# Patient Record
Sex: Female | Born: 1960 | Race: Black or African American | Hispanic: No | Smoking: Former smoker
Health system: Southern US, Community
[De-identification: ages and names within clinical notes are randomized; demographics above are authoritative.]

## PROBLEM LIST (undated history)

## (undated) DIAGNOSIS — E669 Obesity, unspecified: Secondary | ICD-10-CM

## (undated) DIAGNOSIS — J45909 Unspecified asthma, uncomplicated: Secondary | ICD-10-CM

## (undated) DIAGNOSIS — G4700 Insomnia, unspecified: Secondary | ICD-10-CM

## (undated) DIAGNOSIS — M1990 Unspecified osteoarthritis, unspecified site: Secondary | ICD-10-CM

## (undated) DIAGNOSIS — K219 Gastro-esophageal reflux disease without esophagitis: Secondary | ICD-10-CM

## (undated) DIAGNOSIS — Z8709 Personal history of other diseases of the respiratory system: Secondary | ICD-10-CM

## (undated) DIAGNOSIS — IMO0001 Reserved for inherently not codable concepts without codable children: Secondary | ICD-10-CM

## (undated) DIAGNOSIS — Z8659 Personal history of other mental and behavioral disorders: Secondary | ICD-10-CM

## (undated) DIAGNOSIS — G8929 Other chronic pain: Secondary | ICD-10-CM

## (undated) DIAGNOSIS — E7800 Pure hypercholesterolemia, unspecified: Secondary | ICD-10-CM

## (undated) DIAGNOSIS — F32A Depression, unspecified: Secondary | ICD-10-CM

## (undated) DIAGNOSIS — R011 Cardiac murmur, unspecified: Secondary | ICD-10-CM

## (undated) DIAGNOSIS — R569 Unspecified convulsions: Secondary | ICD-10-CM

## (undated) DIAGNOSIS — I739 Peripheral vascular disease, unspecified: Secondary | ICD-10-CM

## (undated) DIAGNOSIS — I10 Essential (primary) hypertension: Secondary | ICD-10-CM

## (undated) DIAGNOSIS — J449 Chronic obstructive pulmonary disease, unspecified: Secondary | ICD-10-CM

## (undated) DIAGNOSIS — I82409 Acute embolism and thrombosis of unspecified deep veins of unspecified lower extremity: Secondary | ICD-10-CM

## (undated) DIAGNOSIS — F329 Major depressive disorder, single episode, unspecified: Secondary | ICD-10-CM

## (undated) HISTORY — PX: DILATION AND CURETTAGE OF UTERUS: SHX78

## (undated) HISTORY — PX: OTHER SURGICAL HISTORY: SHX169

## (undated) HISTORY — PX: COLONOSCOPY: SHX174

## (undated) HISTORY — PX: JOINT REPLACEMENT: SHX530

## (undated) HISTORY — PX: TUBAL LIGATION: SHX77

## (undated) HISTORY — PX: ROTATOR CUFF REPAIR: SHX139

## (undated) HISTORY — PX: HERNIA REPAIR: SHX51

## (undated) HISTORY — PX: TOTAL KNEE ARTHROPLASTY: SHX125

---

## 1998-06-08 ENCOUNTER — Ambulatory Visit (HOSPITAL_COMMUNITY): Admission: RE | Admit: 1998-06-08 | Discharge: 1998-06-08 | Payer: Self-pay | Admitting: Family Medicine

## 1998-06-11 ENCOUNTER — Emergency Department (HOSPITAL_COMMUNITY): Admission: EM | Admit: 1998-06-11 | Discharge: 1998-06-11 | Payer: Self-pay | Admitting: Endocrinology

## 2000-04-17 ENCOUNTER — Ambulatory Visit (HOSPITAL_COMMUNITY): Admission: RE | Admit: 2000-04-17 | Discharge: 2000-04-17 | Payer: Self-pay | Admitting: *Deleted

## 2000-10-22 ENCOUNTER — Ambulatory Visit (HOSPITAL_COMMUNITY): Admission: RE | Admit: 2000-10-22 | Discharge: 2000-10-22 | Payer: Self-pay | Admitting: *Deleted

## 2001-01-23 ENCOUNTER — Encounter: Payer: Self-pay | Admitting: Emergency Medicine

## 2001-01-23 ENCOUNTER — Emergency Department (HOSPITAL_COMMUNITY): Admission: EM | Admit: 2001-01-23 | Discharge: 2001-01-23 | Payer: Self-pay | Admitting: Emergency Medicine

## 2001-04-01 ENCOUNTER — Emergency Department (HOSPITAL_COMMUNITY): Admission: EM | Admit: 2001-04-01 | Discharge: 2001-04-01 | Payer: Self-pay | Admitting: Emergency Medicine

## 2001-11-20 DIAGNOSIS — R569 Unspecified convulsions: Secondary | ICD-10-CM

## 2001-11-20 HISTORY — DX: Unspecified convulsions: R56.9

## 2002-09-04 ENCOUNTER — Ambulatory Visit (HOSPITAL_COMMUNITY): Admission: RE | Admit: 2002-09-04 | Discharge: 2002-09-04 | Payer: Self-pay | Admitting: Family Medicine

## 2002-10-07 ENCOUNTER — Emergency Department (HOSPITAL_COMMUNITY): Admission: EM | Admit: 2002-10-07 | Discharge: 2002-10-07 | Payer: Self-pay | Admitting: Emergency Medicine

## 2002-10-07 ENCOUNTER — Encounter: Payer: Self-pay | Admitting: Emergency Medicine

## 2003-09-02 ENCOUNTER — Emergency Department (HOSPITAL_COMMUNITY): Admission: EM | Admit: 2003-09-02 | Discharge: 2003-09-02 | Payer: Self-pay | Admitting: *Deleted

## 2003-11-17 ENCOUNTER — Ambulatory Visit (HOSPITAL_COMMUNITY): Admission: RE | Admit: 2003-11-17 | Discharge: 2003-11-17 | Payer: Self-pay | Admitting: Internal Medicine

## 2005-02-08 ENCOUNTER — Ambulatory Visit: Payer: Self-pay | Admitting: Nurse Practitioner

## 2005-05-15 ENCOUNTER — Ambulatory Visit: Payer: Self-pay | Admitting: Nurse Practitioner

## 2005-05-18 ENCOUNTER — Ambulatory Visit (HOSPITAL_COMMUNITY): Admission: RE | Admit: 2005-05-18 | Discharge: 2005-05-18 | Payer: Self-pay | Admitting: Internal Medicine

## 2006-01-30 ENCOUNTER — Ambulatory Visit: Payer: Self-pay | Admitting: Nurse Practitioner

## 2006-02-05 ENCOUNTER — Emergency Department (HOSPITAL_COMMUNITY): Admission: EM | Admit: 2006-02-05 | Discharge: 2006-02-05 | Payer: Self-pay | Admitting: Emergency Medicine

## 2006-02-09 ENCOUNTER — Ambulatory Visit: Payer: Self-pay | Admitting: Nurse Practitioner

## 2006-02-10 ENCOUNTER — Ambulatory Visit (HOSPITAL_COMMUNITY): Admission: RE | Admit: 2006-02-10 | Discharge: 2006-02-10 | Payer: Self-pay | Admitting: Internal Medicine

## 2006-02-14 ENCOUNTER — Ambulatory Visit: Payer: Self-pay | Admitting: Nurse Practitioner

## 2006-02-14 ENCOUNTER — Other Ambulatory Visit: Admission: RE | Admit: 2006-02-14 | Discharge: 2006-02-14 | Payer: Self-pay | Admitting: Family Medicine

## 2006-03-01 ENCOUNTER — Ambulatory Visit: Payer: Self-pay | Admitting: Family Medicine

## 2006-03-21 ENCOUNTER — Ambulatory Visit: Payer: Self-pay | Admitting: Nurse Practitioner

## 2006-04-11 ENCOUNTER — Ambulatory Visit (HOSPITAL_COMMUNITY): Admission: RE | Admit: 2006-04-11 | Discharge: 2006-04-11 | Payer: Self-pay | Admitting: Internal Medicine

## 2006-04-18 ENCOUNTER — Encounter: Admission: RE | Admit: 2006-04-18 | Discharge: 2006-04-18 | Payer: Self-pay | Admitting: Neurosurgery

## 2006-05-09 ENCOUNTER — Encounter: Admission: RE | Admit: 2006-05-09 | Discharge: 2006-06-12 | Payer: Self-pay | Admitting: Orthopaedic Surgery

## 2006-05-17 ENCOUNTER — Emergency Department (HOSPITAL_COMMUNITY): Admission: EM | Admit: 2006-05-17 | Discharge: 2006-05-17 | Payer: Self-pay | Admitting: Emergency Medicine

## 2006-06-10 ENCOUNTER — Emergency Department (HOSPITAL_COMMUNITY): Admission: EM | Admit: 2006-06-10 | Discharge: 2006-06-10 | Payer: Self-pay | Admitting: Emergency Medicine

## 2006-06-12 ENCOUNTER — Emergency Department (HOSPITAL_COMMUNITY): Admission: EM | Admit: 2006-06-12 | Discharge: 2006-06-12 | Payer: Self-pay | Admitting: Emergency Medicine

## 2006-06-14 ENCOUNTER — Ambulatory Visit: Payer: Self-pay | Admitting: Nurse Practitioner

## 2006-06-29 ENCOUNTER — Ambulatory Visit (HOSPITAL_COMMUNITY): Admission: RE | Admit: 2006-06-29 | Discharge: 2006-06-29 | Payer: Self-pay | Admitting: Family Medicine

## 2006-07-02 ENCOUNTER — Encounter: Admission: RE | Admit: 2006-07-02 | Discharge: 2006-07-27 | Payer: Self-pay | Admitting: Neurosurgery

## 2006-07-30 ENCOUNTER — Ambulatory Visit: Payer: Self-pay | Admitting: Nurse Practitioner

## 2006-10-04 ENCOUNTER — Encounter: Admission: RE | Admit: 2006-10-04 | Discharge: 2007-01-02 | Payer: Self-pay | Admitting: Orthopaedic Surgery

## 2006-10-24 IMAGING — CR DG CHEST 1V PORT
1 series · 1 of 1 positions shown · non-contrast
Comparison: None

CLINICAL DATA: Syncope

PORTABLE CHEST - 1 VIEW:

[view not recorded]
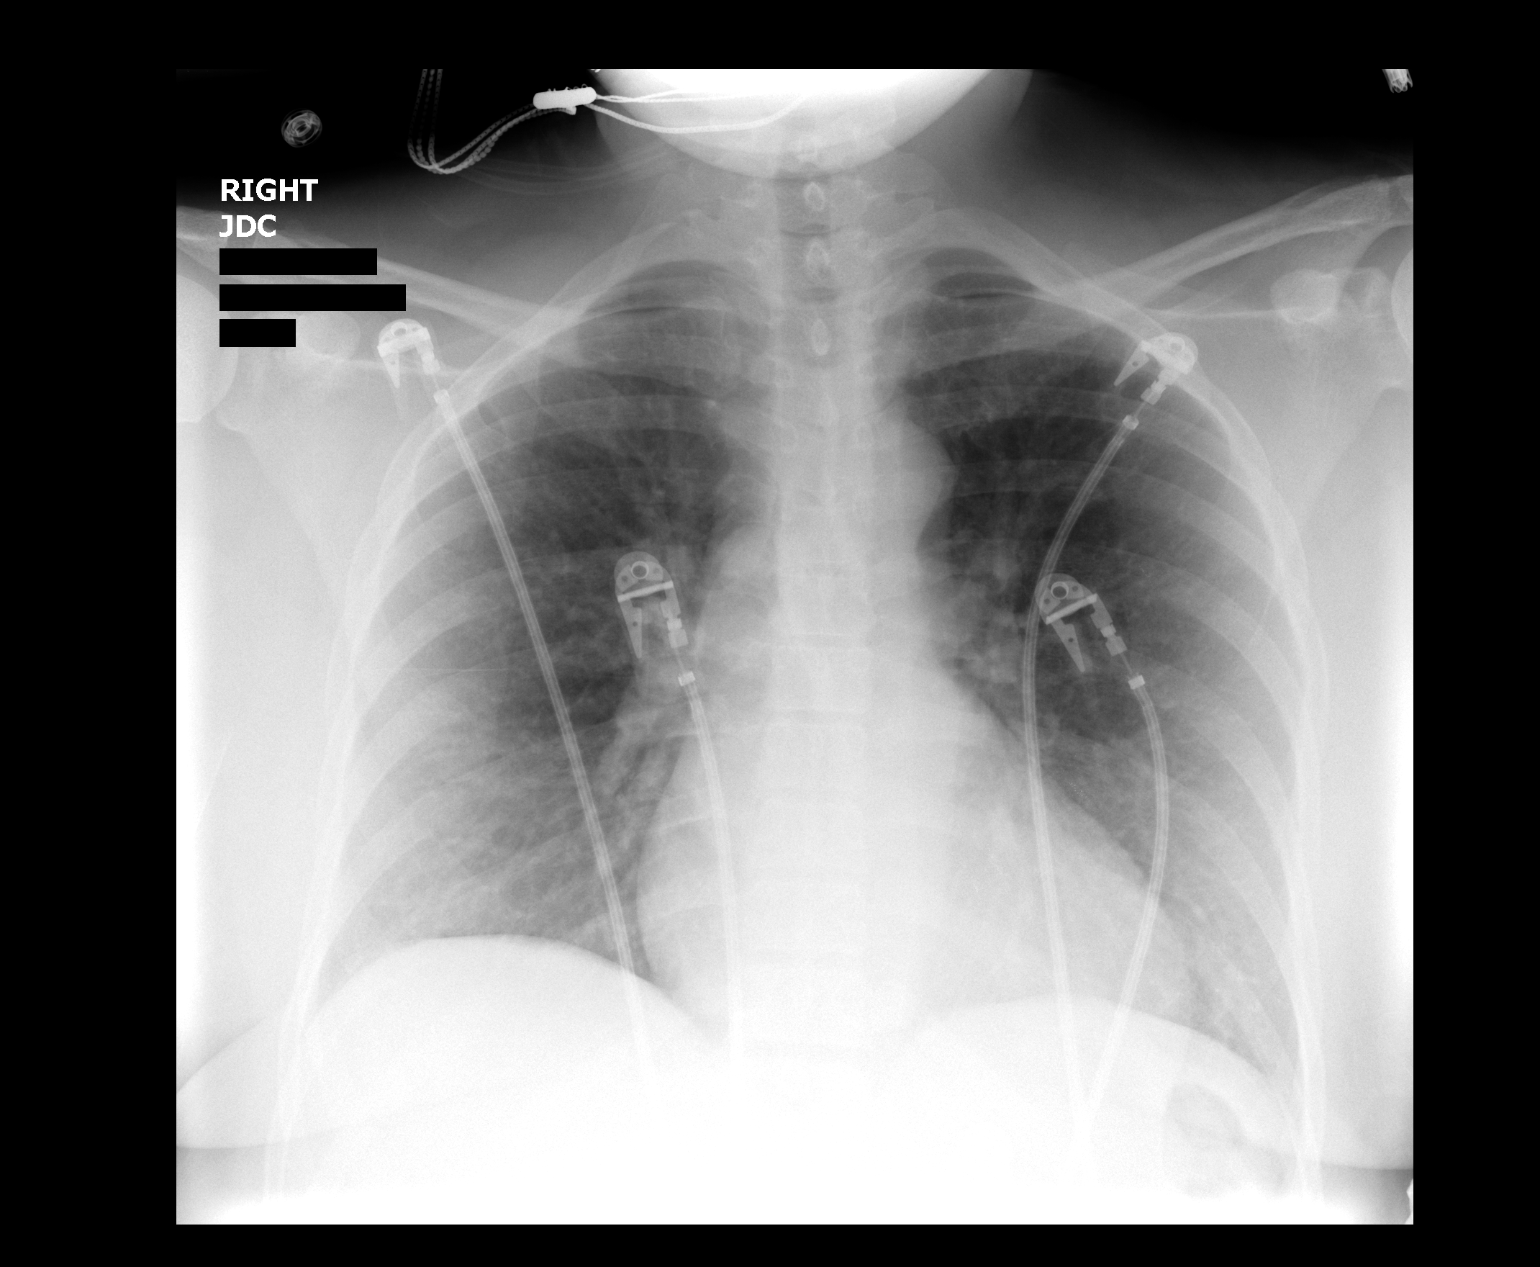

[1 of 1 positions shown; findings below may reference images not displayed]

FINDINGS: The heart size and mediastinal contours are within normal limits. 
Both lungs are clear.  No effusions. Visualized skeleton unremarkable.
IMPRESSION: No acute disease.

## 2006-11-19 IMAGING — CT CT HEAD W/O CM
1 series · 16 of 30 positions shown, 20 images · non-contrast
Comparison: none

CLINICAL DATA: Left arm weakness. Seizures.

[Series 2: head_seq 4.5 h45s st · axial · 0.43mm/px · z∈[-155,-29]mm · 16 of 32 slices shown, 20 images]
[im 2/32  brain]
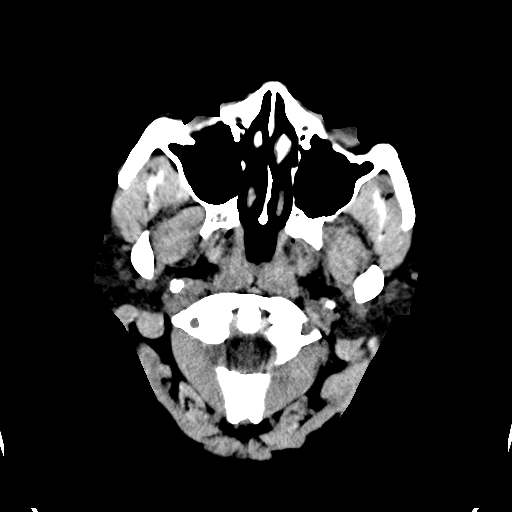
[im 2/32  bone]
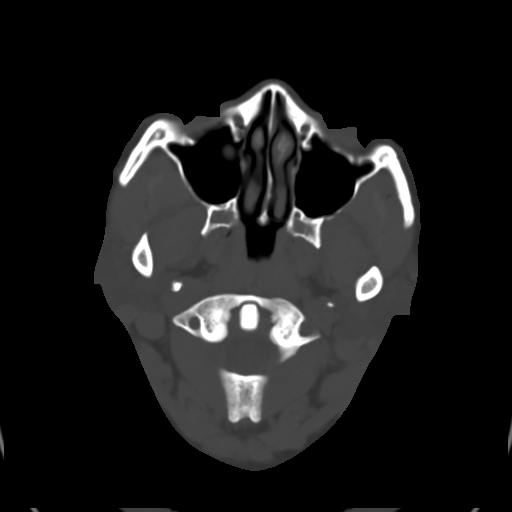
[im 4/32  brain]
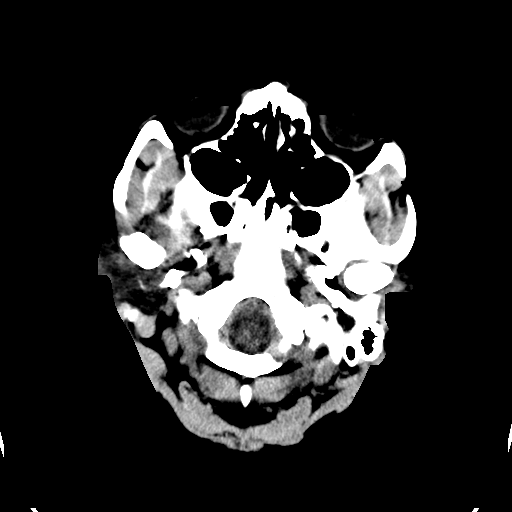
[im 6/32  brain]
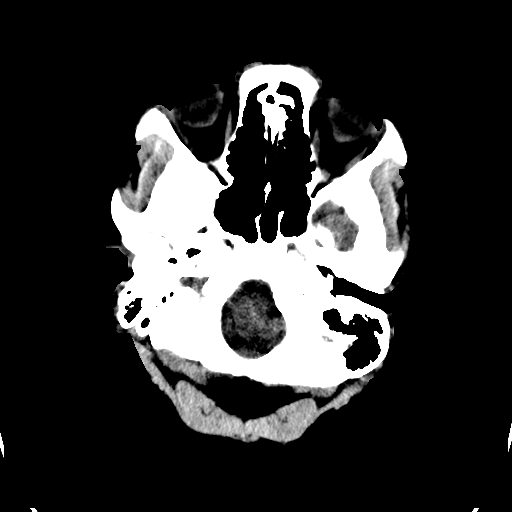
[im 8/32  brain]
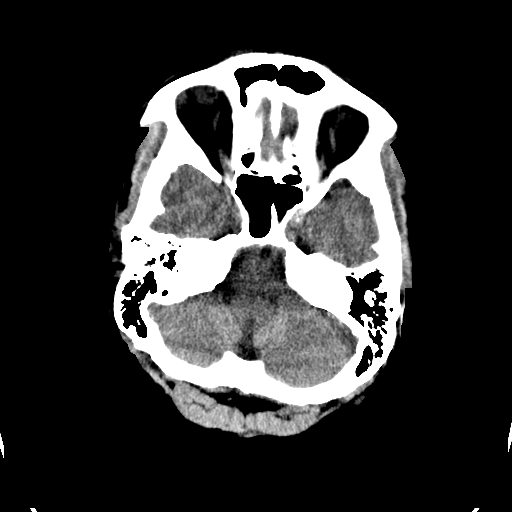
[im 9/32  brain]
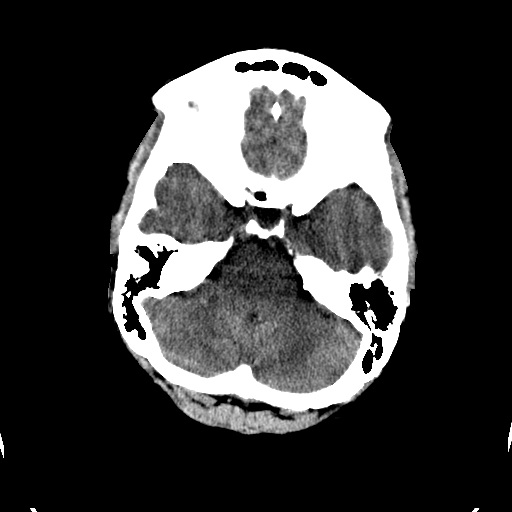
[im 9/32  bone]
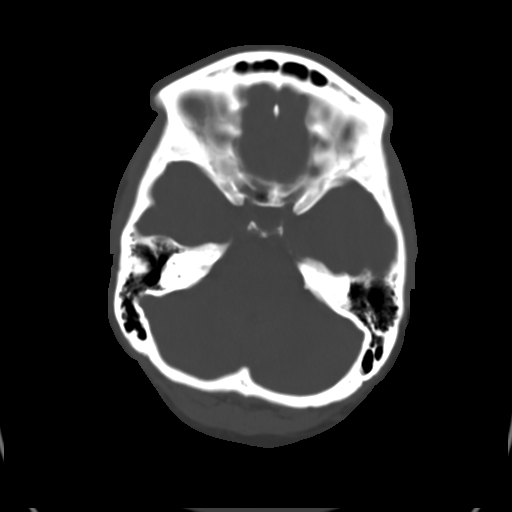
[im 11/32  brain]
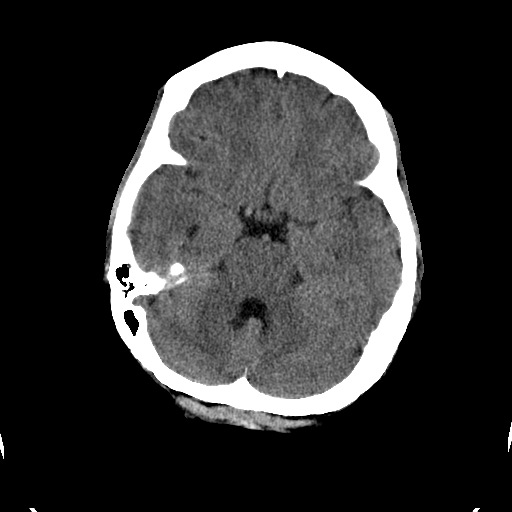
[im 13/32  brain]
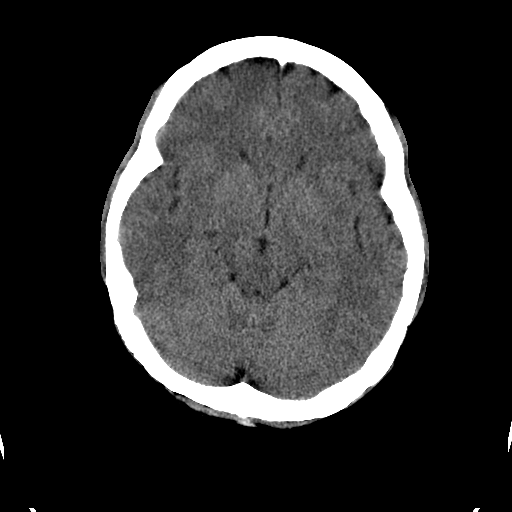
[im 15/32  brain]
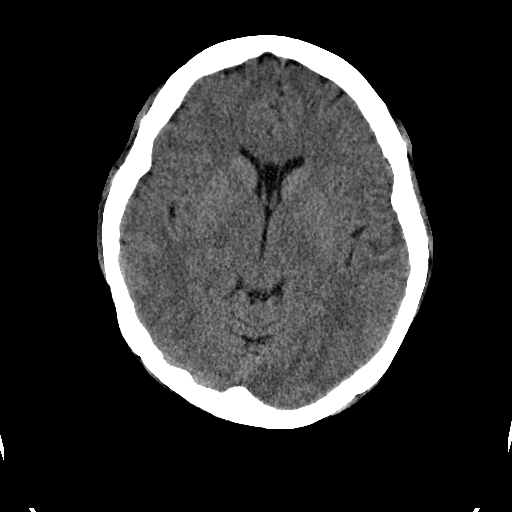
[im 17/32  brain]
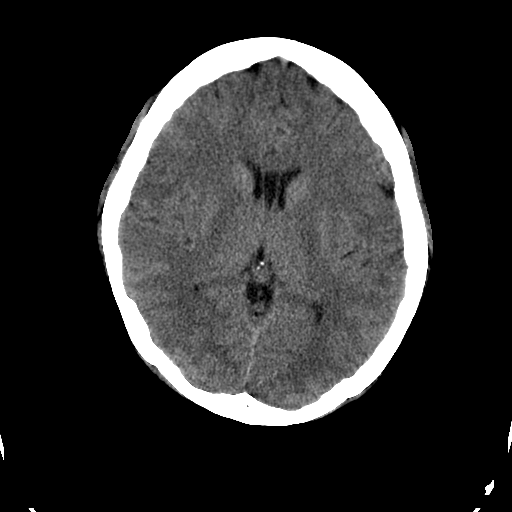
[im 17/32  bone]
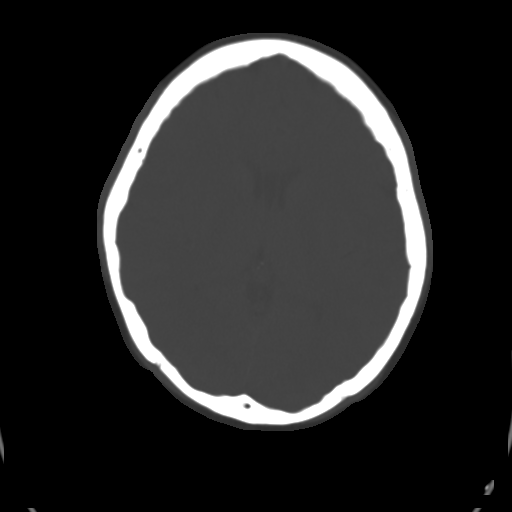
[im 19/32  brain]
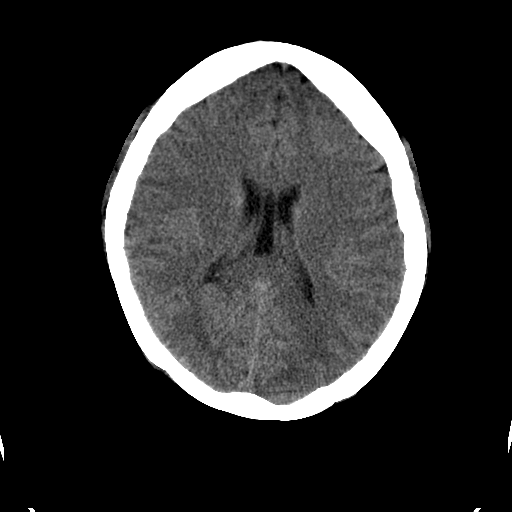
[im 21/32  brain]
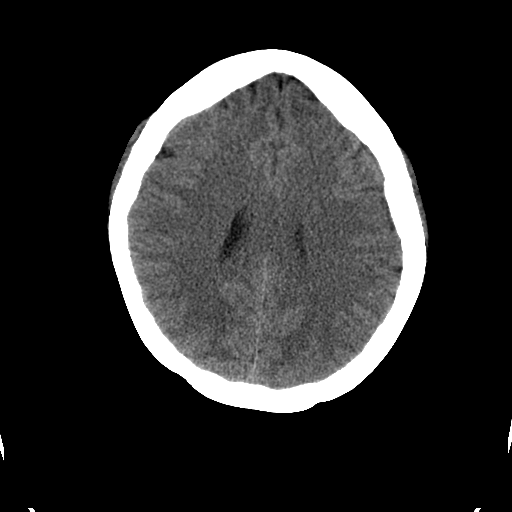
[im 23/32  brain]
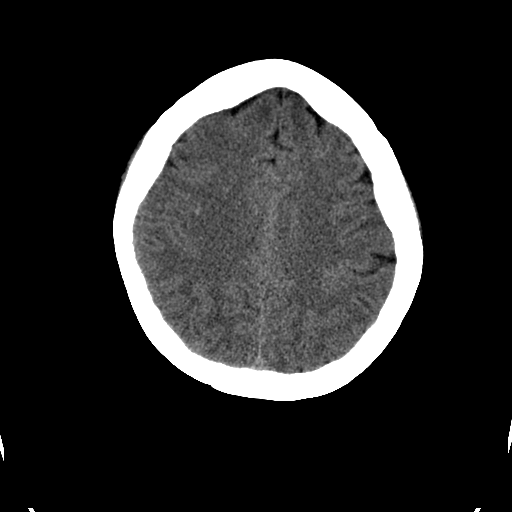
[im 24/32  brain]
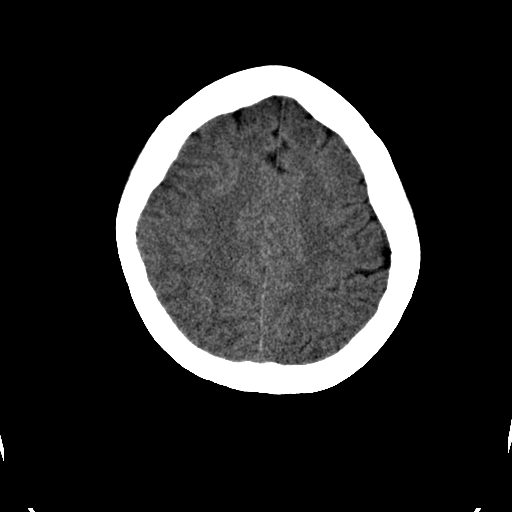
[im 24/32  bone]
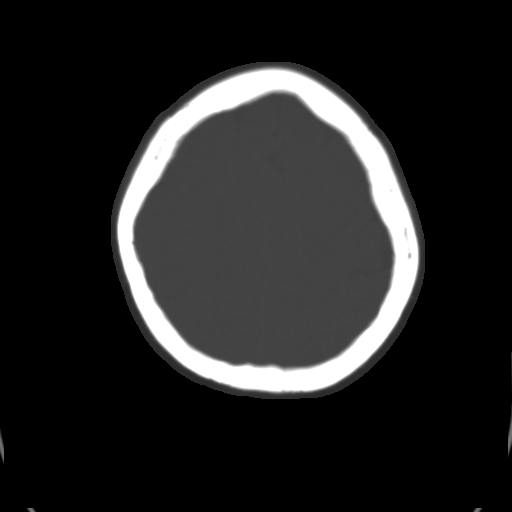
[im 26/32  brain]
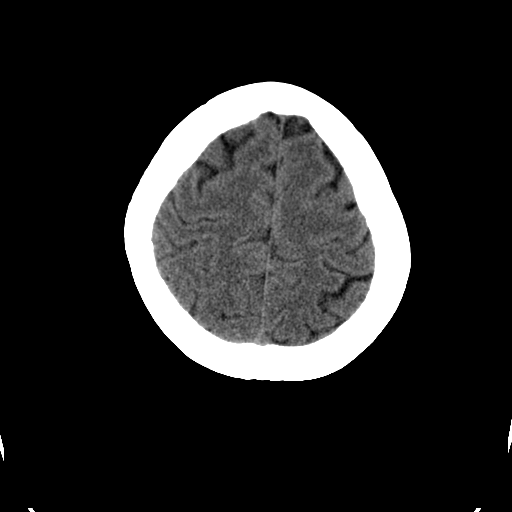
[im 28/32  brain]
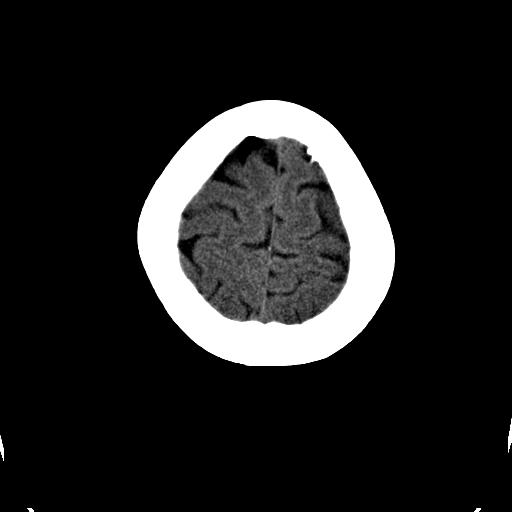
[im 30/32  brain]
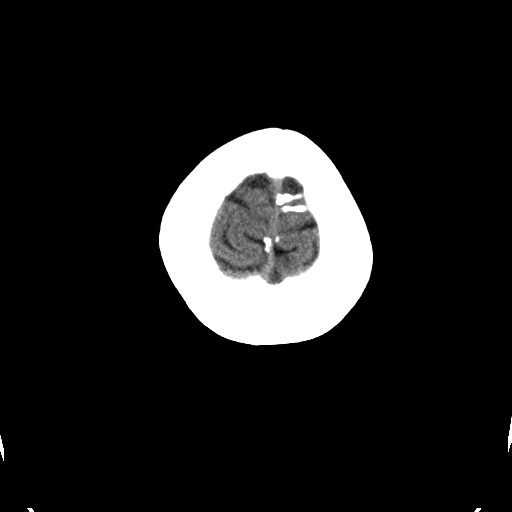

[16 of 30 positions shown; findings below may reference images not displayed]

CT head without contrast:

Comparison 10/07/2002 by report. Cavum septum pellucidum. There is no evidence
of acute intracranial hemorrhage, brain edema,  mass effect, or midline shift.  
No other intra-axial abnormalities are seen, and the ventricles and sulci are
within normal limits in size and symmetry.   No abnormal extra-axial fluid
collections or masses are identified.  No skull abnormalities are noted.
IMPRESSION: 1. Negative non-contrast head CT.

## 2007-01-31 ENCOUNTER — Emergency Department (HOSPITAL_COMMUNITY): Admission: EM | Admit: 2007-01-31 | Discharge: 2007-02-01 | Payer: Self-pay | Admitting: Emergency Medicine

## 2007-02-07 ENCOUNTER — Ambulatory Visit: Payer: Self-pay | Admitting: Nurse Practitioner

## 2007-02-26 ENCOUNTER — Encounter (INDEPENDENT_AMBULATORY_CARE_PROVIDER_SITE_OTHER): Payer: Self-pay | Admitting: Cardiology

## 2007-02-26 ENCOUNTER — Ambulatory Visit (HOSPITAL_COMMUNITY): Admission: RE | Admit: 2007-02-26 | Discharge: 2007-02-26 | Payer: Self-pay | Admitting: Family Medicine

## 2007-07-10 ENCOUNTER — Ambulatory Visit: Payer: Self-pay | Admitting: *Deleted

## 2007-08-07 ENCOUNTER — Encounter (INDEPENDENT_AMBULATORY_CARE_PROVIDER_SITE_OTHER): Payer: Self-pay | Admitting: *Deleted

## 2007-08-08 ENCOUNTER — Ambulatory Visit: Payer: Self-pay | Admitting: Internal Medicine

## 2007-08-11 ENCOUNTER — Ambulatory Visit (HOSPITAL_COMMUNITY): Admission: RE | Admit: 2007-08-11 | Discharge: 2007-08-11 | Payer: Self-pay | Admitting: Nurse Practitioner

## 2007-08-11 ENCOUNTER — Emergency Department (HOSPITAL_COMMUNITY): Admission: EM | Admit: 2007-08-11 | Discharge: 2007-08-11 | Payer: Self-pay | Admitting: Emergency Medicine

## 2007-10-07 ENCOUNTER — Ambulatory Visit: Payer: Self-pay | Admitting: Internal Medicine

## 2007-12-04 ENCOUNTER — Ambulatory Visit: Payer: Self-pay | Admitting: Family Medicine

## 2008-05-13 ENCOUNTER — Encounter
Admission: RE | Admit: 2008-05-13 | Discharge: 2008-08-11 | Payer: Self-pay | Admitting: Physical Medicine & Rehabilitation

## 2008-05-15 ENCOUNTER — Ambulatory Visit: Payer: Self-pay | Admitting: Physical Medicine & Rehabilitation

## 2008-05-18 ENCOUNTER — Ambulatory Visit (HOSPITAL_COMMUNITY)
Admission: RE | Admit: 2008-05-18 | Discharge: 2008-05-18 | Payer: Self-pay | Admitting: Physical Medicine & Rehabilitation

## 2008-06-15 ENCOUNTER — Ambulatory Visit: Payer: Self-pay | Admitting: Physical Medicine & Rehabilitation

## 2008-06-16 ENCOUNTER — Encounter (INDEPENDENT_AMBULATORY_CARE_PROVIDER_SITE_OTHER): Payer: Self-pay | Admitting: Family Medicine

## 2008-06-16 LAB — CONVERTED CEMR LAB
ALT: 33 units/L (ref 0–35)
AST: 23 units/L (ref 0–37)
Alkaline Phosphatase: 115 units/L (ref 39–117)
Basophils Absolute: 0 10*3/uL (ref 0.0–0.1)
Basophils Relative: 0 % (ref 0–1)
Eosinophils Absolute: 0 10*3/uL (ref 0.0–0.7)
Eosinophils Relative: 0 % (ref 0–5)
Glucose, Bld: 96 mg/dL (ref 70–99)
HCT: 43 % (ref 36.0–46.0)
MCHC: 31.9 g/dL (ref 30.0–36.0)
MCV: 89.2 fL (ref 78.0–100.0)
Platelets: 217 10*3/uL (ref 150–400)
RDW: 13.8 % (ref 11.5–15.5)
Sodium: 142 meq/L (ref 135–145)
Total Bilirubin: 0.4 mg/dL (ref 0.3–1.2)
Total Protein: 7.3 g/dL (ref 6.0–8.3)

## 2008-06-23 ENCOUNTER — Ambulatory Visit (HOSPITAL_COMMUNITY)
Admission: RE | Admit: 2008-06-23 | Discharge: 2008-06-23 | Payer: Self-pay | Admitting: Physical Medicine & Rehabilitation

## 2008-08-07 ENCOUNTER — Encounter: Admission: RE | Admit: 2008-08-07 | Discharge: 2008-08-11 | Payer: Self-pay | Admitting: Anesthesiology

## 2008-08-11 ENCOUNTER — Ambulatory Visit: Payer: Self-pay | Admitting: Anesthesiology

## 2008-10-27 ENCOUNTER — Encounter (INDEPENDENT_AMBULATORY_CARE_PROVIDER_SITE_OTHER): Payer: Self-pay | Admitting: Internal Medicine

## 2008-10-27 ENCOUNTER — Ambulatory Visit: Payer: Self-pay | Admitting: Internal Medicine

## 2008-10-27 LAB — CONVERTED CEMR LAB
CO2: 25 meq/L (ref 19–32)
Cholesterol: 227 mg/dL — ABNORMAL HIGH (ref 0–200)
Glucose, Bld: 98 mg/dL (ref 70–99)
Sodium: 141 meq/L (ref 135–145)
Total Bilirubin: 0.8 mg/dL (ref 0.3–1.2)
Total Protein: 7.1 g/dL (ref 6.0–8.3)
Triglycerides: 58 mg/dL (ref <150)
VLDL: 12 mg/dL (ref 0–40)

## 2008-11-20 DIAGNOSIS — I739 Peripheral vascular disease, unspecified: Secondary | ICD-10-CM

## 2008-11-20 HISTORY — DX: Peripheral vascular disease, unspecified: I73.9

## 2008-12-03 ENCOUNTER — Encounter: Admission: RE | Admit: 2008-12-03 | Discharge: 2009-01-28 | Payer: Self-pay | Admitting: Internal Medicine

## 2008-12-09 ENCOUNTER — Emergency Department (HOSPITAL_COMMUNITY): Admission: EM | Admit: 2008-12-09 | Discharge: 2008-12-09 | Payer: Self-pay | Admitting: Emergency Medicine

## 2008-12-15 ENCOUNTER — Ambulatory Visit (HOSPITAL_COMMUNITY): Admission: RE | Admit: 2008-12-15 | Discharge: 2008-12-15 | Payer: Self-pay | Admitting: Internal Medicine

## 2009-01-05 ENCOUNTER — Encounter: Admission: RE | Admit: 2009-01-05 | Discharge: 2009-01-05 | Payer: Self-pay | Admitting: Family Medicine

## 2009-01-06 ENCOUNTER — Encounter (INDEPENDENT_AMBULATORY_CARE_PROVIDER_SITE_OTHER): Payer: Self-pay | Admitting: Internal Medicine

## 2009-01-06 ENCOUNTER — Ambulatory Visit: Payer: Self-pay | Admitting: Family Medicine

## 2009-02-04 ENCOUNTER — Ambulatory Visit: Payer: Self-pay | Admitting: Internal Medicine

## 2009-02-10 ENCOUNTER — Ambulatory Visit (HOSPITAL_COMMUNITY): Admission: RE | Admit: 2009-02-10 | Discharge: 2009-02-10 | Payer: Self-pay | Admitting: Neurology

## 2009-02-23 ENCOUNTER — Encounter (INDEPENDENT_AMBULATORY_CARE_PROVIDER_SITE_OTHER): Payer: Self-pay | Admitting: Internal Medicine

## 2009-02-23 ENCOUNTER — Ambulatory Visit: Payer: Self-pay | Admitting: Internal Medicine

## 2009-02-23 LAB — CONVERTED CEMR LAB
Albumin: 3.7 g/dL (ref 3.5–5.2)
Alkaline Phosphatase: 109 units/L (ref 39–117)
BUN: 10 mg/dL (ref 6–23)
Calcium: 8.8 mg/dL (ref 8.4–10.5)
Chloride: 106 meq/L (ref 96–112)
Glucose, Bld: 84 mg/dL (ref 70–99)
HDL: 67 mg/dL (ref >39)
Potassium: 4.6 meq/L (ref 3.5–5.3)
Triglycerides: 107 mg/dL (ref <150)

## 2009-06-24 ENCOUNTER — Encounter: Admission: RE | Admit: 2009-06-24 | Discharge: 2009-06-24 | Payer: Self-pay | Admitting: Family Medicine

## 2010-02-10 ENCOUNTER — Encounter: Admission: RE | Admit: 2010-02-10 | Discharge: 2010-02-10 | Payer: Self-pay | Admitting: Family Medicine

## 2010-02-28 ENCOUNTER — Ambulatory Visit: Payer: Self-pay | Admitting: Family Medicine

## 2010-02-28 ENCOUNTER — Encounter (INDEPENDENT_AMBULATORY_CARE_PROVIDER_SITE_OTHER): Payer: Self-pay | Admitting: Internal Medicine

## 2010-02-28 LAB — CONVERTED CEMR LAB: CRP: 0.4 mg/dL (ref <0.6)

## 2010-03-28 ENCOUNTER — Ambulatory Visit: Payer: Self-pay | Admitting: Internal Medicine

## 2010-03-28 LAB — CONVERTED CEMR LAB
CRP: 0.4 mg/dL (ref <0.6)
Uric Acid, Serum: 3.6 mg/dL (ref 2.4–7.0)

## 2010-04-14 ENCOUNTER — Encounter: Admission: RE | Admit: 2010-04-14 | Discharge: 2010-04-27 | Payer: Self-pay | Admitting: Internal Medicine

## 2010-05-12 ENCOUNTER — Other Ambulatory Visit: Admission: RE | Admit: 2010-05-12 | Discharge: 2010-05-12 | Payer: Self-pay | Admitting: Family Medicine

## 2010-05-12 ENCOUNTER — Ambulatory Visit: Payer: Self-pay | Admitting: Internal Medicine

## 2010-05-12 LAB — CONVERTED CEMR LAB
ALT: 24 units/L (ref 0–35)
AST: 24 units/L (ref 0–37)
Basophils Relative: 1 % (ref 0–1)
CO2: 29 meq/L (ref 19–32)
Chloride: 103 meq/L (ref 96–112)
Lymphs Abs: 1.5 10*3/uL (ref 0.7–4.0)
Monocytes Relative: 11 % (ref 3–12)
Neutro Abs: 1.4 10*3/uL — ABNORMAL LOW (ref 1.7–7.7)
Neutrophils Relative %: 42 % — ABNORMAL LOW (ref 43–77)
RBC: 4.77 M/uL (ref 3.87–5.11)
Sodium: 140 meq/L (ref 135–145)
Total Bilirubin: 0.3 mg/dL (ref 0.3–1.2)
Total Protein: 7.2 g/dL (ref 6.0–8.3)
Uric Acid, Serum: 4.5 mg/dL (ref 2.4–7.0)
WBC: 3.4 10*3/uL — ABNORMAL LOW (ref 4.0–10.5)

## 2010-11-07 ENCOUNTER — Ambulatory Visit (HOSPITAL_COMMUNITY)
Admission: RE | Admit: 2010-11-07 | Discharge: 2010-11-07 | Payer: Self-pay | Source: Home / Self Care | Attending: Family Medicine | Admitting: Family Medicine

## 2010-12-11 ENCOUNTER — Encounter: Payer: Self-pay | Admitting: Internal Medicine

## 2010-12-11 ENCOUNTER — Encounter: Payer: Self-pay | Admitting: Family Medicine

## 2010-12-12 ENCOUNTER — Encounter: Payer: Self-pay | Admitting: Family Medicine

## 2010-12-12 ENCOUNTER — Encounter: Payer: Self-pay | Admitting: Physical Medicine & Rehabilitation

## 2011-01-23 ENCOUNTER — Other Ambulatory Visit: Payer: Self-pay | Admitting: Family Medicine

## 2011-01-23 DIAGNOSIS — Z1231 Encounter for screening mammogram for malignant neoplasm of breast: Secondary | ICD-10-CM

## 2011-02-14 ENCOUNTER — Ambulatory Visit: Payer: Self-pay

## 2011-02-16 ENCOUNTER — Other Ambulatory Visit: Payer: Self-pay | Admitting: Family Medicine

## 2011-02-16 ENCOUNTER — Ambulatory Visit: Payer: Self-pay

## 2011-02-16 ENCOUNTER — Ambulatory Visit
Admission: RE | Admit: 2011-02-16 | Discharge: 2011-02-16 | Disposition: A | Discharge status: Home / Self Care | Payer: Medicaid Other | Source: Ambulatory Visit | Attending: Family Medicine | Admitting: Family Medicine

## 2011-02-16 DIAGNOSIS — Z1231 Encounter for screening mammogram for malignant neoplasm of breast: Secondary | ICD-10-CM

## 2011-02-17 ENCOUNTER — Other Ambulatory Visit: Payer: Self-pay

## 2011-02-21 ENCOUNTER — Other Ambulatory Visit: Payer: Self-pay | Admitting: Orthopedic Surgery

## 2011-02-21 ENCOUNTER — Encounter (HOSPITAL_COMMUNITY): Payer: Medicaid Other

## 2011-02-21 ENCOUNTER — Ambulatory Visit (HOSPITAL_COMMUNITY)
Admission: RE | Admit: 2011-02-21 | Discharge: 2011-02-21 | Disposition: A | Discharge status: Home / Self Care | Payer: Medicaid Other | Source: Ambulatory Visit | Attending: Orthopedic Surgery | Admitting: Orthopedic Surgery

## 2011-02-21 ENCOUNTER — Other Ambulatory Visit (HOSPITAL_COMMUNITY): Payer: Self-pay | Admitting: Orthopedic Surgery

## 2011-02-21 DIAGNOSIS — J45909 Unspecified asthma, uncomplicated: Secondary | ICD-10-CM | POA: Insufficient documentation

## 2011-02-21 DIAGNOSIS — Z01818 Encounter for other preprocedural examination: Secondary | ICD-10-CM | POA: Insufficient documentation

## 2011-02-21 DIAGNOSIS — Z01810 Encounter for preprocedural cardiovascular examination: Secondary | ICD-10-CM | POA: Insufficient documentation

## 2011-02-21 DIAGNOSIS — I10 Essential (primary) hypertension: Secondary | ICD-10-CM | POA: Insufficient documentation

## 2011-02-21 DIAGNOSIS — Z01812 Encounter for preprocedural laboratory examination: Secondary | ICD-10-CM | POA: Insufficient documentation

## 2011-02-21 LAB — BASIC METABOLIC PANEL
BUN: 12 mg/dL (ref 6–23)
Creatinine, Ser: 0.92 mg/dL (ref 0.4–1.2)
GFR calc non Af Amer: >60 mL/min (ref >60)
Glucose, Bld: 89 mg/dL (ref 70–99)

## 2011-02-21 LAB — SURGICAL PCR SCREEN: Staphylococcus aureus: NEGATIVE

## 2011-02-21 LAB — URINALYSIS, ROUTINE W REFLEX MICROSCOPIC
Bilirubin Urine: NEGATIVE
Nitrite: NEGATIVE
Specific Gravity, Urine: 1.013 (ref 1.005–1.030)
Urobilinogen, UA: 0.2 mg/dL (ref 0.0–1.0)
pH: 6 (ref 5.0–8.0)

## 2011-02-21 LAB — PROTIME-INR
INR: 0.93 (ref 0.00–1.49)
Prothrombin Time: 12.7 seconds (ref 11.6–15.2)

## 2011-02-21 LAB — DIFFERENTIAL
Basophils Absolute: 0 10*3/uL (ref 0.0–0.1)
Basophils Relative: 0 % (ref 0–1)
Lymphocytes Relative: 37 % (ref 12–46)
Monocytes Absolute: 0.6 10*3/uL (ref 0.1–1.0)
Neutro Abs: 2 10*3/uL (ref 1.7–7.7)
Neutrophils Relative %: 49 % (ref 43–77)

## 2011-02-21 LAB — APTT: aPTT: 28 seconds (ref 24–37)

## 2011-02-21 LAB — CBC
HCT: 41.9 % (ref 36.0–46.0)
RDW: 13.5 % (ref 11.5–15.5)
WBC: 4.1 10*3/uL (ref 4.0–10.5)

## 2011-02-27 NOTE — H&P (Signed)
Amanda Christensen, Amanda Christensen                ACCOUNT NO.:  [id number]  MEDICAL RECORD NO.:  [id number]          PATIENT TYPE:  LOCATION:                                 FACILITY:  PHYSICIAN:  Madlyn Frankel. Charlann Boxer, M.D.  DATE OF BIRTH:  Nov 04, 1961  DATE OF ADMISSION: DATE OF DISCHARGE:                             HISTORY & PHYSICAL   ADMITTING DIAGNOSIS:  Right knee osteoarthritis.  HISTORY OF PRESENT ILLNESS:  This is a 50 year old lady with a history of osteoarthritis of her right knee with failure of conservative treatment to alleviate her symptoms.  After discussion of treatment, benefits, risks, and options, the patient now scheduled for total knee arthroplasty of the right knee.  Note, she will be going to a nursing home postoperatively so postop medications are not given today.  Also note that she is a candidate for Tranexamic acid and she will get that in the preop holding area.  PAST MEDICAL HISTORY:  Drug allergies to PENICILLIN, which she passes out.  CURRENT MEDICATIONS: 1. Dilantin 100 mg two daily. 2. Cetirizine 10 mg one daily. 3. QVAR 0.08 mg inhaler one daily. 4. Verapamil ER 240 mg one daily. 5. Ventolin 120 mcg one b.i.d. 6. Cymbalta one daily. 7. Trazodone 100 mg one daily. 8. Salsalate 750 mg one t.i.d. p.r.n. 9. Cyclobenzaprine 10 mg one b.i.d. 10.Hydrocodone 5/500 one b.i.d.  Serious medical illnesses include history of seizure disorder, last seizure being over a year ago, allergies, hypertension, and depression.  Previous surgeries include left shoulder, C-section, tubal ligation, and herniorrhaphy.  FAMILY HISTORY:  Positive for cancer.  SOCIAL HISTORY:  She is married.  She lives at home.  Her husband is a truck driver so she will need to go to a nursing home postop as she has no care at home.  She does not smoke any more and does not drink.  REVIEW OF SYSTEMS:  CENTRAL NERVOUS SYSTEM:  Negative for headache, blurred vision, or dizziness.  Positive for  history of seizure disorder, depression, and anxiety.  PULMONARY:  Negative for shortness of breath, PND, or orthopnea.  Positive for history of asthma.  CARDIOVASCULAR: Negative for chest pain or palpitation.  GI:  Negative for ulcers, hepatitis.  GU:  Negative for urinary tract difficulty. MUSCULOSKELETAL:  Positive as in HPI.  PHYSICAL EXAMINATION:  VITAL SIGNS:  BP 120/80, respirations 16, pulse 76 and regular. GENERAL APPEARANCE:  This is a well-developed and well-nourished lady in no acute distress. HEENT:  Head normocephalic.  Nose patent.  Ears patent.  Pupils equal, round, and reactive to light.  Throat without injection. NECK:  Supple without adenopathy.  Carotids are 2+ without bruit. CHEST:  Clear to auscultation.  No rales or rhonchi.  Respirations 16. HEART:  Regular rate and rhythm at 76 beats per minute without murmur. ABDOMEN:  Soft.  Active bowel sounds.  No masses or organomegaly. NEUROLOGIC:  The patient alert and oriented to time, place, and person. Cranial nerves II through XII grossly intact. EXTREMITIES:  The right knee with 0-110 degrees range of motion, which is painful.  Neurovascular status intact.  ASSESSMENT:  Osteoarthritis, right knee.  PLAN:  Total knee arthroplasty right knee.     Jaquelyn Bitter. Chabon, P.A.   ______________________________ Madlyn Frankel Charlann Boxer, M.D.    SJC/MEDQ  D:  02/22/2011  T:  02/23/2011  Job:  [id number]  Electronically Signed by Jodene Nam P.A. on 02/27/2011 09:45:48 AM Electronically Signed by Durene Romans M.D. on 02/27/2011 01:53:47 PM

## 2011-03-06 ENCOUNTER — Inpatient Hospital Stay (HOSPITAL_COMMUNITY)
Admission: RE | Admit: 2011-03-06 | Discharge: 2011-03-13 | DRG: 470 | Disposition: A | Discharge status: Skilled Nursing Facility | Payer: Medicaid Other | Source: Ambulatory Visit | Attending: Orthopedic Surgery | Admitting: Orthopedic Surgery

## 2011-03-06 DIAGNOSIS — I10 Essential (primary) hypertension: Secondary | ICD-10-CM | POA: Diagnosis present

## 2011-03-06 DIAGNOSIS — Z01812 Encounter for preprocedural laboratory examination: Secondary | ICD-10-CM

## 2011-03-06 DIAGNOSIS — M1710 Unilateral primary osteoarthritis, unspecified knee: Principal | ICD-10-CM | POA: Diagnosis present

## 2011-03-06 DIAGNOSIS — G40909 Epilepsy, unspecified, not intractable, without status epilepticus: Secondary | ICD-10-CM | POA: Diagnosis present

## 2011-03-06 DIAGNOSIS — J4489 Other specified chronic obstructive pulmonary disease: Secondary | ICD-10-CM | POA: Diagnosis present

## 2011-03-06 DIAGNOSIS — J449 Chronic obstructive pulmonary disease, unspecified: Secondary | ICD-10-CM | POA: Diagnosis present

## 2011-03-06 DIAGNOSIS — F341 Dysthymic disorder: Secondary | ICD-10-CM | POA: Diagnosis present

## 2011-03-06 DIAGNOSIS — R339 Retention of urine, unspecified: Secondary | ICD-10-CM | POA: Diagnosis not present

## 2011-03-06 DIAGNOSIS — M7989 Other specified soft tissue disorders: Secondary | ICD-10-CM | POA: Diagnosis not present

## 2011-03-06 LAB — URINALYSIS, ROUTINE W REFLEX MICROSCOPIC
Bilirubin Urine: NEGATIVE
Glucose, UA: NEGATIVE mg/dL
Protein, ur: NEGATIVE mg/dL
Specific Gravity, Urine: 1.019 (ref 1.005–1.030)
Urobilinogen, UA: 1 mg/dL (ref 0.0–1.0)

## 2011-03-06 LAB — DIFFERENTIAL
Basophils Absolute: 0 10*3/uL (ref 0.0–0.1)
Lymphocytes Relative: 32 % (ref 12–46)
Lymphs Abs: 1.5 10*3/uL (ref 0.7–4.0)
Neutro Abs: 2.2 10*3/uL (ref 1.7–7.7)
Neutrophils Relative %: 48 % (ref 43–77)

## 2011-03-06 LAB — CBC
Platelets: 183 10*3/uL (ref 150–400)
RDW: 12.8 % (ref 11.5–15.5)
WBC: 4.6 10*3/uL (ref 4.0–10.5)

## 2011-03-06 LAB — TYPE AND SCREEN: Antibody Screen: NEGATIVE

## 2011-03-06 LAB — RAPID URINE DRUG SCREEN, HOSP PERFORMED
Amphetamines: NOT DETECTED
Barbiturates: NOT DETECTED
Cocaine: POSITIVE — AB
Opiates: NOT DETECTED
Tetrahydrocannabinol: NOT DETECTED

## 2011-03-06 LAB — BASIC METABOLIC PANEL
BUN: 6 mg/dL (ref 6–23)
Calcium: 9.4 mg/dL (ref 8.4–10.5)
GFR calc non Af Amer: 54 mL/min — ABNORMAL LOW (ref >60)
Glucose, Bld: 104 mg/dL — ABNORMAL HIGH (ref 70–99)

## 2011-03-06 LAB — PHENYTOIN LEVEL, TOTAL
Phenytoin Lvl: <2.5 ug/mL — ABNORMAL LOW (ref 10.0–20.0)
Phenytoin Lvl: 3.3 ug/mL — ABNORMAL LOW (ref 10.0–20.0)

## 2011-03-06 LAB — URINE MICROSCOPIC-ADD ON

## 2011-03-06 LAB — POCT PREGNANCY, URINE: Preg Test, Ur: NEGATIVE

## 2011-03-06 LAB — ETHANOL: Alcohol, Ethyl (B): <5 mg/dL (ref 0–10)

## 2011-03-07 LAB — BASIC METABOLIC PANEL
Chloride: 106 mEq/L (ref 96–112)
Creatinine, Ser: 1.07 mg/dL (ref 0.4–1.2)
GFR calc Af Amer: >60 mL/min (ref >60)
Potassium: 4.4 mEq/L (ref 3.5–5.1)
Sodium: 139 mEq/L (ref 135–145)

## 2011-03-07 LAB — CBC
Hemoglobin: 11.2 g/dL — ABNORMAL LOW (ref 12.0–15.0)
MCH: 28.5 pg (ref 26.0–34.0)
Platelets: 155 10*3/uL (ref 150–400)
RBC: 3.93 MIL/uL (ref 3.87–5.11)
WBC: 7.8 10*3/uL (ref 4.0–10.5)

## 2011-03-07 NOTE — Op Note (Signed)
Amanda Christensen                ACCOUNT NO.:  [id number]  MEDICAL RECORD NO.:  [id number]           PATIENT TYPE:  I  LOCATION:  1621                         FACILITY:  [facility]  PHYSICIAN:  Madlyn Frankel. Charlann Boxer, M.D.  DATE OF BIRTH:  1961-11-06  DATE OF PROCEDURE:  03/06/2011 DATE OF DISCHARGE:                              OPERATIVE REPORT   PREOPERATIVE DIAGNOSIS:  Right knee osteoarthritis.  POSTOPERATIVE DIAGNOSIS:  Right knee osteoarthritis.  PROCEDURE:  Right total knee replacement.  COMPONENTS USED:  DePuy rotating platform posterior stabilized knee system with a size 2 femur, 2 tibia, 15 mm insert, and a 35 patellar button.  SURGEON:  Madlyn Frankel. Charlann Boxer, MD  ASSISTANT:  Amanda Bitter. Chabon, PA-C.  ANESTHESIA:  Preoperative regional femoral nerve block plus general anesthetic.  SPECIMENS:  None.  COMPLICATIONS:  None.  DRAINS:  One Hemovac.  TOURNIQUET TIME:  40 minutes at 250 mmHg.  INDICATION FOR PROCEDURE:  Ms. Lampi is a 50 year old female with right knee osteoarthritis.  She had failed conservative measures and had clear changes compared to her left knee.  She wished to proceed with more definitive measures.  Risks of infection, DVT, component failure, need for revision surgery all discussed and reviewed.  Consent was obtained for the benefit of pain relief.  PROCEDURE IN DETAIL:  The patient was brought to the operative theater. Once adequate anesthesia, preoperative antibiotics, Ancef 2 g administered, the patient was positioned supine with the right thigh tourniquet placed.  The right lower extremity was then prepped and draped in a sterile fashion.  A time-out was performed identifying the patient, planned procedure, and extremity.  At this point, the right foot was placed in [facility] leg holder, leg was exsanguinated, tourniquet elevated to 250 mmHg.  A midline incision was made followed by median arthrotomy.  Following initial exposure and debridement,  attention was directed to patella. Precut measurement was only 20 mm.  I resected down to about 12-13 mm and used a 35 patellar button to restore patellar height.  Lug holes were drilled and a metal shim placed to protect the patella from retractors and saw blades.  At this point, the attention was now directed to the femur.  Femoral canal was opened with a drill and irrigated to try to prevent fat emboli.  An intramedullary rod was passed and at 3 degrees of valgus, I resected 10-mm bone off the distal femur.  Following this resection, the tibia was subluxated anteriorly and using extramedullary guide, I made a resection of 10 mm off the proximal lateral tibia was carried out. Following this resection, we confirmed that the gap was going to be stable with 10-mm insert.  In addition, we found that the cut was perpendicular in the coronal plane.  At this point, I sized the femur to be a size 2.  The size 2 rotation block was pinned into position, anterior reference using the C clamp on the proximal tibia to set rotation.  A 4:1 cutting block was placed, pinned, and confirmed to have no notching.  The anterior and posterior chamfer cuts were then made without difficulty  or no notching and the final box cut was made off the lateral aspect of distal femur.  At this point, the tibia was subluxated anteriorly in the tibial cut surface to best fit was size 2 tibial tray.  It was pinned into position, drilled, and keel punched.  Trial reduction was now carried out with 2 femur, 2 tibia, initially 12.5 insert.  A little bit difficult due to the size of her lower extremity to determine the neutrality of her alignment, particularly in the sagittal plane. Nonetheless, at this point, we felt that knee was stable and at least balanced from extension to flexion.  The patella tracked to the trochlea without application of pressure.  At this point, trial component was removed, the knee was irrigated  with normal saline solution, pulse lavaged.  The synovial capsule junction was injected with 0.25% Marcaine with epinephrine, 1 cc of Toradol, total of 51 cc.  At this point, the knee was irrigated with pulse lavage, normal saline solution.  Final components were opened and cement was mixed.  The final components were then cemented onto clean and dried cut surface of the bone.  The knee was brought to extension with a 12.5 insert and extruded cement was removed.  Once the cement had fully cured and excessive cement was removed throughout the knee, the final insert was chosen, I chose to go at this point with a 15 mm insert.  On the way to placement of final polyethylene insert, I did end up using 10 cc of FloSeal mainly in the posterior and posterolateral aspect of the knee to help with some oozing.  Nonetheless, at this point, it all remained stable.  The final insert was placed, the knee was re-irrigated with normal saline solution.  A medium Hemovac drain was placed deep.  The extensor mechanism was now re-approximated with the knee in flexion using #1 Vicryl.  The remainder of the wound was closed with 2-0 Vicryl and running 4-0 Monocryl.  The knee was cleaned, dried, and dressed sterilely using Dermabond and Aquacel dressing and drain site dressed separately.  She was then brought to recovery room, extubated in stable condition tolerating the procedure well.     Madlyn Frankel Charlann Boxer, M.D.     MDO/MEDQ  D:  03/06/2011  T:  03/07/2011  Job:  [id number]  Electronically Signed by Durene Romans M.D. on 03/07/2011 07:43:18 AM

## 2011-03-08 LAB — CBC
Hemoglobin: 11.4 g/dL — ABNORMAL LOW (ref 12.0–15.0)
MCV: 90.1 fL (ref 78.0–100.0)
Platelets: 150 10*3/uL (ref 150–400)
RBC: 3.95 MIL/uL (ref 3.87–5.11)
WBC: 6.2 10*3/uL (ref 4.0–10.5)

## 2011-03-08 LAB — BASIC METABOLIC PANEL
BUN: 4 mg/dL — ABNORMAL LOW (ref 6–23)
CO2: 26 mEq/L (ref 19–32)
Chloride: 106 mEq/L (ref 96–112)
GFR calc Af Amer: >60 mL/min (ref >60)
Potassium: 4.5 mEq/L (ref 3.5–5.1)

## 2011-03-08 LAB — URINALYSIS, MICROSCOPIC ONLY
Glucose, UA: NEGATIVE mg/dL
Leukocytes, UA: NEGATIVE
pH: 6 (ref 5.0–8.0)

## 2011-03-09 LAB — URINE CULTURE
Colony Count: NO GROWTH
Culture  Setup Time: 201204181500
Culture: NO GROWTH
Special Requests: NEGATIVE

## 2011-03-10 NOTE — Discharge Summary (Signed)
NAMEJAVANA, Amanda Christensen                ACCOUNT NO.:  [id number]  MEDICAL RECORD NO.:  [id number]           PATIENT TYPE:  I  LOCATION:  1621                         FACILITY:  [facility]  PHYSICIAN:  Madlyn Frankel. Charlann Boxer, M.D.  DATE OF BIRTH:  Sep 02, 1961  DATE OF ADMISSION:  03/06/2011 DATE OF DISCHARGE:  03/09/2011                        DISCHARGE SUMMARY - REFERRING   ADMITTING DIAGNOSIS:  Right knee osteoarthritis.  DISCHARGE DIAGNOSES: 1. Right knee osteoarthritis status post right total knee replacement     on March 06, 2011. 2. Urinary retention. 3. Asthma/chronic obstructive pulmonary disease. 4. History of seizure disorder. 5. History of hypertension. 6. Anxiety and depression.  ADMITTING HISTORY:  Amanda Christensen is a pleasant 50 year old female who was seen and evaluated for right knee osteoarthritis, had failed conservative measures.  She was seen and evaluated in the office and was ready to proceed with more definitive measures.  Risks and benefits of the surgery were discussed and surgery was scheduled.  HOSPITAL COURSE:  The patient admitted for same-day surgery on March 05, 2021.  She underwent right total knee replacement uncomplicated.  Please see dictated operative note for full details of the procedure. Postoperatively after routine stay in recovery room, she was transferred to orthopedic ward where she remained during her hospital stay. Postoperatively day #1, her Foley catheter was removed as well as her Hemovac drain.  She was seen and evaluated by Physical Therapy.  Her labs remained stable throughout her hospital stay with postoperative day #2 the labs noting hematocrit of 35.6, normal platelets, and white count and electrolytes remained stable.  She was noted during her hospital stay to have issues with urinary retention.  When she would go to the bathroom, she was able to initiate voiding with would always have a sense of fullness.  Bladder scanning revealed 200 cc  to 400 cc of urine per time of scanning.  On postoperative day #2, we decided to reinsert the Foley overnight and retrial on postoperative day #3.  On day #3, she was ready and prepared for discharge to skilled nursing facility.  The plan will be reviewed at the end in terms of discharge instructions.  Otherwise, she was seen and evaluated by Physical Therapy, was ambulating in halls well and was ready to proceed to more skilled level rehab prior to returning home, she does not have family at home.  There are no postoperative complications.  She did not require transfusion.  DISCHARGE CONDITION:  She is stable at time of discharge.  DISCHARGE INSTRUCTIONS:  The patient will be discharged to skilled nursing facility for a 1 to 3 week period of time determined by her level of independence, the strength and gait training.  She will be on a regular diet.  Her wound currently is dressed with an Aquacel dressing that will remain in place until March 14, 2011, at which time it can be removed and dry gauze and tape placed.  She should keep the wound dry.  The current dressing is waterproof.  She will be seen and evaluated by Physical Therapy and Occupational Therapy as needed for gait training, strengthening and mobility  issues.  DISCHARGE INSTRUCTIONS:  Discharge instructions regarding the Foley; at the time of discharge if the Foley remained in place, I did speak with Neurology that Foley catheter can remain in place until followup arranged with [facility] in 1 week's time.  Phone number is on her pink sheet at the hospital.  She is going to be placed on Bactrim 1 tablet b.i.d. while the Foley catheter is placed for prophylaxis.  Urinalysis on postop day #2 was negative for infection.  MEDICATIONS:  Her other medications include Colace 100 mg p.o. b.i.d. while on pain medicine for constipation, aspirin 325 mg p.o. b.i.d. for 30 days, iron 325 mg b.i.d. for 2 weeks, Robaxin 500  mg p.o. q.6 h as needed for muscle spasm and pain, oxycodone 5 to 15 mg every 4 to 6 hours as needed for pain, MiraLax 17 g p.o. daily as needed for constipation can be used twice a day as needed, cetirizine 10 mg daily, cyclobenzaprine 10 mg b.i.d., Cymbalta q.a.m., Diovan 100 mg b.i.d., Vicodin as needed when at home, Qvar 1 puff b.i.d., salsalate 750 mg t.i.d. as needed, trazodone 100 mg q.h.s. as needed for insomnia, Ventolin 2 puffs b.i.d., verapamil 240 mg q.h.s..  Amanda Christensen will follow up with Dr. Durene Romans at [facility] at office number [phone] in 2 weeks' time point and probably is already set.  She will also contact us for any orthopedic questions, and any urologic questions will be followed up with [facility], phone number again in the chart.     Madlyn Frankel Charlann Boxer, M.D.     MDO/MEDQ  D:  03/09/2011  T:  03/09/2011  Job:  [id number]  Electronically Signed by Durene Romans M.D. on 03/10/2011 03:44:39 PM

## 2011-03-12 DIAGNOSIS — M79609 Pain in unspecified limb: Secondary | ICD-10-CM

## 2011-03-13 NOTE — Discharge Summary (Signed)
  NAMESYLVA, Amanda Christensen                ACCOUNT NO.:  [id number]  MEDICAL RECORD NO.:  [id number]          PATIENT TYPE:  LOCATION:                                 FACILITY:  PHYSICIAN:  Madlyn Frankel. Charlann Boxer, M.D.  DATE OF BIRTH:  1961-01-26  DATE OF ADMISSION: DATE OF DISCHARGE:                        DISCHARGE SUMMARY - REFERRING   ADDENDUM:  Date of anticipated discharge now March 13, 2011.  Amanda Christensen is a 50 year old female now 1 week out from her right total knee replacement and she has had a delayed hospitalization and discharge planning regarding insurance purposes.  She is now day #7 out from surgery.  She has not had any postoperative complications.  This has been a long delay in terms of working out insurance issues.  She did complain of some calf swelling or so over the weekend for which an ultrasound was ordered of bilateral lower extremities, which was negative for DVT.  On postop day #7 now March 13, 2011, I am going to retrial an attempt at removing a Foley to see if she is able to void completely.  At the time of this dictation, the Foley will be removed in the morning. If she is unable to completely void by 1 or 2 o'clock or at the time of discharge to a skilled nurse facility, the Foley will be replaced.  If the Foley is in place at this nursing facility, she will remain on Bactrim b.i.d. for 14 days or until the Foley catheter is out.  If the Foley catheter remains, she will need an appointment with [facility] has been previously discussed and reviewed.  If the Foley catheter is out, the new antibiotics may be stopped and the appointment cancelled.  Questions were encouraged and reviewed.  She otherwise continue working with Physical Therapy to try to maximize her overall recovery.  As she works, ice will continue to be used for her knee.  Her discharge condition is unchanged and still remains stable.  Her discharge medications remain the same other than  aforementioned issues regarding antibiotics but the Foley.     Madlyn Frankel Charlann Boxer, M.D.     MDO/MEDQ  D:  03/13/2011  T:  03/13/2011  Job:  [id number]  Electronically Signed by Durene Romans M.D. on 03/13/2011 12:06:09 PM

## 2011-03-24 ENCOUNTER — Ambulatory Visit: Payer: Medicaid Other | Admitting: Urology

## 2011-03-24 ENCOUNTER — Ambulatory Visit (HOSPITAL_COMMUNITY): Payer: Medicaid Other

## 2011-03-24 ENCOUNTER — Ambulatory Visit (INDEPENDENT_AMBULATORY_CARE_PROVIDER_SITE_OTHER): Payer: Medicaid Other | Admitting: Urology

## 2011-03-24 DIAGNOSIS — R339 Retention of urine, unspecified: Secondary | ICD-10-CM

## 2011-04-04 NOTE — Group Therapy Note (Signed)
CHIEF COMPLAINT:  Left shoulder knee and back pain.   HISTORY OF PRESENT ILLNESS:  This is a 50 year old [place] American  female who we were asked to see at the request of Dr. Doneen Poisson regarding left shoulder pain.  Apparently, he had performed  laparoscopic surgery on the patient in 2007.  She has continued to have  some pain in the area.  She has been involved with vocational rehab  regarding job reentry, as well as assessment process apparently at this  point.  The patient reports decreased shoulder range of motion and pain,  which limits her household duties and general activity.  She also  complains of weakness in the left hand, as well as pain in the right  hand and wrist.  The right knee has been very problematic as well.  She  denies any type of treatment for this.  She states, she may have had x-  rays of her right knee 5-10 years ago.  I will add that the patient was  a fair-to-poor historian.  She is not really using much for pain at this  point.  She had been on medications including muscle relaxants and lower-  level narcotics in the past for treatment of her pain.   The patient rates her pain at 10/10 and describes it as sharp, aching,  and constant.  The pain interferes with general activity, relations with  others, and enjoyment of life on a moderate level.  She had been on  Medicaid for a while and had a home health assistant, but this was  cutoff when she lost her Medicaid.  It sounds as if things have gotten  bad at home with her husband who also needs assistance, unable to  perform things around the house.  She states that the house has become  decreasingly in order.  She has had some psychosocial issues regarding  her children as well, which has complicated her situation.   The patient reports pain as worse in her knee and back, when she walks  and bends.  She also has a hard time standing for any length of time.  She can walk 2-3 minutes without  having to stop, and then she has to sit  down.  She denies frank numbness in the hands or legs today.  She states  that pain is better with rest, sometimes heat and ice, as well as  injections.   CURRENT MEDICATIONS:  1. Trazodone 100 mg nightly.  2. Paxil CR 37.5 mg daily.  3. Dilantin 100 mg 2 twice a day.  4. Allegra 180 mg daily.  5. Calan SR 240 mg daily.  6. Protonix 40 mg daily.  7. Nasacort inhaler and Ventolin inhalers twice daily.   ALLERGIES:  PENICILLIN.  She also reports intolerance to NSAIDs due to  GI upset and reflux.   PAST MEDICAL HISTORY:  Positive for depression and insomnia, as well as  seizure disorder (absence seizures), high blood pressure, reflux  disease, arthritis, and tendinitis as listed above.  She also reports  miscarriage in 1987, a cesarean section in 1989, and hernia in 1996.  She also reports tuberculosis.   SOCIAL HISTORY:  The patient is married, living with her husband.  She  has 3 children who had some issues of their own.  She is involved with  vocational rehab.  Currently, she is a homemaker.  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   FAMILY HISTORY:  Positive for  high blood pressure, psychiatric issues,  drug abuse, and disability.   REVIEW OF SYSTEMS:  Notable for trouble walking, dizziness, bladder  control issues, depression, anxiety, suicidal thoughts, weight gain,  cough, and shortness of breath.  Full review is in the written health  and history section chart.   PHYSICAL EXAMINATION:  VITAL SIGNS:  Blood pressure 136/82, pulse 76,  respiratory rate 18.  She is sating 98% on room air.  GENERAL:  The patient is pleasant.  She has flattened affect, but  otherwise appropriate.  MUSCULOSKELETAL:  She walks with significant antalgia, favoring the  right knee today.  She has a mild valgus deformity at the knee, but  really good with most of her weight on the left leg when she was in  stance phase.  It was hard to get her to shift  to the right as the knee  seemed to want to buckle.  The patient's coordination or fine motor  movements were otherwise fair when it was not evident by pain.  Reflexes  were brisk at 2+.  Sensation is decreased along the median aspect of the  left hand at the prominent first 2 fingers.  Otherwise, sensory exam was  normal.  The patient had positive rotator cuff impingement signs in the  left shoulder with weakness in the supraspinatus muscle.  She also was  tender along both biceps tendon heads.  Low back was painful to  palpation, and she had difficult times with flexion and extension today,  although extension seemed to provoke more discomfort.  I did not try  facet maneuvers today.  Rotation and lateral bending caused discomfort.  She also had pain along the both greater trochanter regions and both  PSIS areas.  Tinel signs were positive in both wrists today.  The  patient's right knee was painful with meniscal maneuvers as well as  resisted flexion and extension today.  She seemed to have a lot of  peripatellar pain and popliteal discomfort today.  Mild periarticular  effusion is appreciated.  I did not appreciate any frank instability.  NEUROLOGIC:  Cranial nerves II through XII  are intact.  Strength was  generally was preserved in the upper extremities except for some  weakness in the left hand with finger grasp, and the patient has a  positive okay sign.  Both legs were intact for general motor function  except for pain inhibition to right knee.  The patient's posture was  poor due to impaired weightbearing and weight was primary to the left  leg and right hemipelvis was elevated as a consequence.  HEART:  Regular rate.  CHEST:  Clear.  ABDOMEN:  Soft and nontender.  The patient is slightly obese,  particularly in the truncal region.   ASSESSMENT:  1. 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   She      likely has supraspinatus tear.  2. Bilateral carpal tunnel syndrome.  3. Spondylosis of lumbar spine.  4. Osteoarthritis of the right knee and likely meniscal injury.  5. Depression.  6. Centralized pain.  7. Insomnia.   PLAN:  1. Recommended bilateral carpal tunnel splint to used at night and      during the day if able.  We will hold off on any NSAIDs due to      prior intolerance.  She may be on Celebrex as well before without  results.  2. We will have the x-rays of the right knee down to assess the      severity of her osteoarthritis.  3. After informed consent, we injected the right knee via lateral      approach using 40 mg of Kenalog and 3 mL of 1% lidocaine.  The      patient tolerated it well.  4. We will  transition over from Paxil to Cymbalta to better treat      mood and pain symptoms.  5. Begin trial of Darvocet-N 100 one q.6 h. p.r.n., #60.  Urine drug      screen was collected today.  6. Continue trazodone for now for sleep with followup with Mental      Health.  7. Consider right knee brace versus Synvisc injections.  8. I will see the patient back in about a month's time.      Ranelle Oyster, M.D.  Electronically Signed     ZTS/MedQ  D:  05/15/2008 14:17:46  T:  05/16/2008 09:08:23  Job #:  [id number]   cc:   Vanita Panda. Magnus Ivan, M.D.  Fax: [phone]

## 2011-04-04 NOTE — Assessment & Plan Note (Signed)
The patient is back regarding multiple pain complaints again.  Her urine  drug screen came back for cocaine.  The patient states that she has been  clean since.  We injected her right knee with good results until last  couple of days.  She reports some increased tightness and pain.  X-ray  was notable for mild osteoarthritis.  We switched over to Cymbalta  without any adverse effects as of yet.  The patient rates her pain in  9/10; describes it as sharp and constant aching.  Her back seems to be  the biggest problem at the moment today, although she has pain in the  right hand, left shoulder, and right knee.  Pain interferes with general  activity, relations with others, and enjoyment of life on a severe  level.  Sleep is fair.   REVIEW OF SYSTEMS:  Notable for bladder control problems, trouble  walking, spasms, depression, and anxiety.  Does have problems with some  occasional coughing, shortness of breath, and constipation.  Full review  is in the written health and history section of chart.   SOCIAL HISTORY:  The patient is married, living with her husband.  She  smokes half a pack of cigarettes per day.   PHYSICAL EXAMINATION:  VITAL SIGNS:  Blood pressure is 130/70, pulse 71,  respiratory rate 18, and she is sating 100% on room air.  GENERAL:  The patient is pleasant, alert, and oriented x3.  She is bit a  anxious at times.  MUSCULOSKELETAL:  She walks with antalgia favoring the right leg today.  Left hip is also painful.  When examine that closure, she seemed to have  more tenderness in the low back, which was provocative with flexion and  extension.  She is limited in both planes.  She had pain along the PSIS  areas as well as lower lumbar paraspinals and facets.  Strength is  generally 5/5 except for some give way weakness at the right knee.  Left  shoulder was painful with any type of range of motion; had limited  movement in fact today. Overall, lumbar thoracic posture was  poor.  HEART:  Regular.  CHEST:  Clear.  ABDOMEN:  Soft and nontender.   ASSESSMENT:  1. 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  2. Bilateral carpal tunnel syndrome.  3. Spondylosis lumbar spine, likely degenerative disk and facet      disease.  4. Osteoarthritis of the right knee and meniscal injury.  5. Depression.  6. Centralized pain.   PLAN:  1. I will not write any narcotics today.  2. Check urine drug screen in 1 month.  3. Need MRI of the lumbar spine to assess disks and facets.  4. After informed consent, we injected the left shoulder via posterior      approach with 40 mg of Kenalog and 3 mL of 1% Lidocaine.  5. Continue Cymbalta for now.  6. Wrote for Lidoderm patches 3 a day to back, shoulder, and her knee.  7. Soma 350 mg one q.8. hours p.r.n. spasm.  8. We will see her back in about a month's time.  9. Wrote a prescription for figure 8 harness of the left shoulder to      assist and support while she is up moving around.      Ranelle Oyster, M.D.  Electronically Signed     ZTS/MedQ  D:  06/15/2008 14:18:02  T:  06/16/2008 04:46:30  Job #:  [id number]  cc:   Vanita Panda. Magnus Ivan, M.D.  Fax: [phone]

## 2011-04-04 NOTE — Procedures (Signed)
NAME:  Amanda Christensen, Amanda Christensen NO.:  [id number]   MEDICAL RECORD NO.:  [id number]           PATIENT TYPE:   LOCATION:                                 FACILITY:   PHYSICIAN:  Celene Kras, MD        DATE OF BIRTH:  Jan 03, 1961   DATE OF PROCEDURE:  DATE OF DISCHARGE:                               OPERATIVE REPORT   [facility].  I evaluated her  and reviewed the Health and History form and 14-point review of systems.  I reviewed the MRI, progress data and overall directed care approach.  1. Functional impairment secondary to low back pain, decreased      endurance, and range of motion activities.  She is describing a      spondylitic pain, essentially no radiculopathy, inferior gluteal      pain, some pain in the posterior thigh.  2. It is reasonable that the lot most problematic level is 5 and 4,      with possible 3 as contributory overlay.  This will be right and      left side under local anesthetic, so to effect block at 2 levels      that being 5 and 4, we will add level 3, as a contributory      intervention.  3. Assess within context of activities of daily living.  She states      she is disabled, and I really do not see any obvious disabling      features at least from the spondylitic position, but she is also      stating that the left shoulder is problematic.  She seems to move      it with fairly good range of motion, and perhaps with a good      response to the lumbar intervention, we can start looking for      vocational options.  I will see her with long-term disability, but      will defer to our rehab colleagues.   Today's plan is to block, consider second intervention, positive  predictive experience may lead [place] to consideration of radiofrequency  neuroablation.  Risks, complications, and options were fully outlined.  Questions were answered and discussed in lay terms.   Objectively, diffuse paralumbar  myofascitis, Fortin test positive.  Side  bending range of motion impaired secondary to pain.  Mostly myofascial  and mechanical.  I do not see anything new neurologically.  Shoulder has  fair-to-adequate range of motion, modest abduction impairment.  Mostly  myofascial.   IMPRESSION:  She has positive lumbar spine with spondylosis, minimal  myelopathy.   PLAN:  1. Facet medial branch intervention 5, 4, and 3, right and left side,      independent needle access points under local anesthetic, and she      has consented.  I predicate further intervention based on need and      overall response.  2. Follow up with her to determine further course of care.  3. Modified with features and health  profile, weight control      recommended.  She is consented for today's procedure.   The patient is taken to the fluoroscopy suite and placed in prone  position, back prepped and draped in usual fashion.  Using a 22-gauge  spinal needle, I advanced the facet at the medial branch at 5, 4, and 3  right and left side under local anesthetic.  I confirmed placement in  multiple fluoroscopic positions.  No CSF, heme, or paresthesia  identified.  Test uneventfully followed 1 mL lidocaine 1% MPF at each  level with a total of 40 mg Aristocort in divided doses.   She tolerated the procedure well.  No complications from our procedure.  Appropriate recovery.  Discharge instructions given.           ______________________________  Celene Kras, MD     HH/MEDQ  D:  08/11/2008 10:51:28  T:  08/12/2008 00:22:18  Job:  [id number]

## 2011-04-07 ENCOUNTER — Emergency Department (HOSPITAL_COMMUNITY)
Admission: EM | Admit: 2011-04-07 | Discharge: 2011-04-07 | Disposition: A | Discharge status: Home / Self Care | Payer: Medicaid Other | Attending: Emergency Medicine | Admitting: Emergency Medicine

## 2011-04-07 DIAGNOSIS — Z96659 Presence of unspecified artificial knee joint: Secondary | ICD-10-CM | POA: Insufficient documentation

## 2011-04-07 DIAGNOSIS — Z7901 Long term (current) use of anticoagulants: Secondary | ICD-10-CM | POA: Insufficient documentation

## 2011-04-07 DIAGNOSIS — I82409 Acute embolism and thrombosis of unspecified deep veins of unspecified lower extremity: Secondary | ICD-10-CM | POA: Insufficient documentation

## 2011-04-07 DIAGNOSIS — M25569 Pain in unspecified knee: Secondary | ICD-10-CM | POA: Insufficient documentation

## 2011-04-07 NOTE — Procedures (Signed)
REQUESTING PHYSICIAN:  Kelly L. Philipp Deputy, M.D.   PROCEDURE:  Routine EEG done with photic and hyperventilation.   CLINICAL HISTORY:  History of staring spells increasing in frequency.  EEG  is performed for evaluation of possible seizure.   DESCRIPTION:  The dominant rhythm of this tracing is a moderate amplitude of  alpha rhythm of 10-11 hertz, which predominantly is posteriorly.  It appears  of that as a normal asymmetry and attenuates with eye opening and closing.  Low amplitude fast activity is seen over the frontal and central head  regions and appears not a normal asymmetry.  Occasional higher amplitude  beta rhythms occur with a frontal central dominance.  Muscle artifact is  noted throughout the record.  No focal slowing and epileptiform discharges  are seen.  The patient remained in an awake state throughout the recording.  Photic stimulation produced symmetric driving responses best seen at 11  hertz.  Hyperventilation did not produce any change in the background  rhythms and was not well performed due to persistent coughing.  A single  channel __________ EKG revealed sinus rhythm throughout with a rate of  approximately 66 beats/minute.   CONCLUSIONS:  Normal study in the awake state.    Fenton Malling, M.D.   ZOX:WRUE  D:  28/10/2003 20:11:07  T:  28/10/2003 20:59:09  Job #:  [id number]

## 2011-04-07 NOTE — Procedures (Signed)
CLINICAL HISTORY:  This 50 year old lady being evaluated for possible  seizure disorder.   Background awake rhythm consists of 9-10 Hz alpha which is of a moderate  amplitude.  __________ reactive to eye-opening and closure.  No paroxysmal  epileptiform activity spikes or sharp waves are seen.  Sleep stages are not  seen in this tracing except for mild drowsiness which was uneventful.  Hyperventilation and photic stimulation are both unremarkable.  Length of  this tracing is 29.4 minutes.  Technical component is average.  EKG tracing  reveals a regular sinus rhythm.   IMPRESSION:  This EEG performed during awake states is within normal limits.  No definitive epileptiform activity identified.           ______________________________  Sunny Schlein. Pearlean Brownie, MD     BJY:NWGN  D:  06/29/2006 13:31:19  T:  06/29/2006 15:22:19  Job #:  [id number]

## 2011-04-13 ENCOUNTER — Emergency Department (HOSPITAL_COMMUNITY)
Admission: EM | Admit: 2011-04-13 | Discharge: 2011-04-14 | Disposition: A | Discharge status: Home / Self Care | Payer: Medicaid Other | Source: Home / Self Care | Attending: Emergency Medicine | Admitting: Emergency Medicine

## 2011-04-13 DIAGNOSIS — N390 Urinary tract infection, site not specified: Secondary | ICD-10-CM | POA: Insufficient documentation

## 2011-04-13 DIAGNOSIS — R339 Retention of urine, unspecified: Secondary | ICD-10-CM | POA: Insufficient documentation

## 2011-04-13 DIAGNOSIS — J4489 Other specified chronic obstructive pulmonary disease: Secondary | ICD-10-CM | POA: Insufficient documentation

## 2011-04-13 DIAGNOSIS — J449 Chronic obstructive pulmonary disease, unspecified: Secondary | ICD-10-CM | POA: Insufficient documentation

## 2011-04-13 DIAGNOSIS — Z87440 Personal history of urinary (tract) infections: Secondary | ICD-10-CM | POA: Insufficient documentation

## 2011-04-16 ENCOUNTER — Emergency Department (HOSPITAL_COMMUNITY): Payer: Medicaid Other

## 2011-04-16 ENCOUNTER — Encounter (HOSPITAL_COMMUNITY): Payer: Self-pay

## 2011-04-16 ENCOUNTER — Inpatient Hospital Stay (HOSPITAL_COMMUNITY)
Admission: EM | Admit: 2011-04-16 | Discharge: 2011-04-23 | DRG: 301 | Disposition: A | Discharge status: Home / Self Care | Payer: Medicaid Other | Attending: Internal Medicine | Admitting: Internal Medicine

## 2011-04-16 DIAGNOSIS — Z7901 Long term (current) use of anticoagulants: Secondary | ICD-10-CM

## 2011-04-16 DIAGNOSIS — J4489 Other specified chronic obstructive pulmonary disease: Secondary | ICD-10-CM | POA: Diagnosis present

## 2011-04-16 DIAGNOSIS — J449 Chronic obstructive pulmonary disease, unspecified: Secondary | ICD-10-CM | POA: Diagnosis present

## 2011-04-16 DIAGNOSIS — M79609 Pain in unspecified limb: Secondary | ICD-10-CM

## 2011-04-16 DIAGNOSIS — F341 Dysthymic disorder: Secondary | ICD-10-CM | POA: Diagnosis present

## 2011-04-16 DIAGNOSIS — M1990 Unspecified osteoarthritis, unspecified site: Secondary | ICD-10-CM | POA: Diagnosis present

## 2011-04-16 DIAGNOSIS — E669 Obesity, unspecified: Secondary | ICD-10-CM | POA: Diagnosis present

## 2011-04-16 DIAGNOSIS — I10 Essential (primary) hypertension: Secondary | ICD-10-CM | POA: Diagnosis present

## 2011-04-16 DIAGNOSIS — I82409 Acute embolism and thrombosis of unspecified deep veins of unspecified lower extremity: Principal | ICD-10-CM | POA: Diagnosis present

## 2011-04-16 DIAGNOSIS — G40909 Epilepsy, unspecified, not intractable, without status epilepticus: Secondary | ICD-10-CM | POA: Diagnosis present

## 2011-04-16 DIAGNOSIS — Z96659 Presence of unspecified artificial knee joint: Secondary | ICD-10-CM

## 2011-04-16 LAB — BASIC METABOLIC PANEL
BUN: 7 mg/dL (ref 6–23)
Chloride: 102 mEq/L (ref 96–112)
Creatinine, Ser: 0.73 mg/dL (ref 0.4–1.2)

## 2011-04-16 LAB — DIFFERENTIAL
Basophils Absolute: 0 10*3/uL (ref 0.0–0.1)
Basophils Relative: 0 % (ref 0–1)
Eosinophils Absolute: 0.3 10*3/uL (ref 0.0–0.7)
Eosinophils Relative: 5 % (ref 0–5)
Monocytes Absolute: 0.8 10*3/uL (ref 0.1–1.0)

## 2011-04-16 LAB — CBC
HCT: 37.3 % (ref 36.0–46.0)
MCHC: 31.6 g/dL (ref 30.0–36.0)
RDW: 13.1 % (ref 11.5–15.5)

## 2011-04-16 LAB — PROTIME-INR
INR: 1.79 — ABNORMAL HIGH (ref 0.00–1.49)
Prothrombin Time: 21 seconds — ABNORMAL HIGH (ref 11.6–15.2)

## 2011-04-16 MED ORDER — IOHEXOL 300 MG/ML  SOLN
100.0000 mL | Freq: Once | INTRAMUSCULAR | Status: AC | PRN
  Administered 2011-04-16: 100 mL via INTRAVENOUS

## 2011-04-17 LAB — CBC
HCT: 34.3 % — ABNORMAL LOW (ref 36.0–46.0)
Hemoglobin: 10.9 g/dL — ABNORMAL LOW (ref 12.0–15.0)
MCH: 28.5 pg (ref 26.0–34.0)
MCV: 89.8 fL (ref 78.0–100.0)
Platelets: 355 10*3/uL (ref 150–400)
RBC: 3.82 MIL/uL — ABNORMAL LOW (ref 3.87–5.11)

## 2011-04-17 LAB — DIFFERENTIAL
Eosinophils Absolute: 0.2 10*3/uL (ref 0.0–0.7)
Lymphs Abs: 2.2 10*3/uL (ref 0.7–4.0)
Monocytes Relative: 14 % — ABNORMAL HIGH (ref 3–12)
Neutrophils Relative %: 41 % — ABNORMAL LOW (ref 43–77)

## 2011-04-17 LAB — COMPREHENSIVE METABOLIC PANEL
BUN: 4 mg/dL — ABNORMAL LOW (ref 6–23)
CO2: 29 mEq/L (ref 19–32)
Chloride: 107 mEq/L (ref 96–112)
Creatinine, Ser: 0.58 mg/dL (ref 0.4–1.2)
GFR calc non Af Amer: >60 mL/min (ref >60)
Glucose, Bld: 93 mg/dL (ref 70–99)
Total Bilirubin: 0.2 mg/dL — ABNORMAL LOW (ref 0.3–1.2)

## 2011-04-17 LAB — PHOSPHORUS: Phosphorus: 3.3 mg/dL (ref 2.3–4.6)

## 2011-04-17 LAB — TSH: TSH: 2.592 u[IU]/mL (ref 0.350–4.500)

## 2011-04-17 LAB — MAGNESIUM: Magnesium: 1.9 mg/dL (ref 1.5–2.5)

## 2011-04-17 LAB — PHENYTOIN LEVEL, TOTAL: Phenytoin Lvl: 3.8 ug/mL — ABNORMAL LOW (ref 10.0–20.0)

## 2011-04-17 LAB — RAPID URINE DRUG SCREEN, HOSP PERFORMED: Barbiturates: NOT DETECTED

## 2011-04-17 LAB — HOMOCYSTEINE: Homocysteine: 11.2 umol/L (ref 4.0–15.4)

## 2011-04-18 LAB — BASIC METABOLIC PANEL
CO2: 28 mEq/L (ref 19–32)
GFR calc Af Amer: >60 mL/min (ref >60)
GFR calc non Af Amer: >60 mL/min (ref >60)
Glucose, Bld: 93 mg/dL (ref 70–99)
Potassium: 3.2 mEq/L — ABNORMAL LOW (ref 3.5–5.1)
Sodium: 141 mEq/L (ref 135–145)

## 2011-04-18 LAB — LUPUS ANTICOAGULANT PANEL
PTT Lupus Anticoagulant: 82.9 secs — ABNORMAL HIGH (ref 30.0–45.6)
PTTLA Confirmation: 11.8 secs — ABNORMAL HIGH (ref <8.0)

## 2011-04-18 LAB — CBC
HCT: 35.3 % — ABNORMAL LOW (ref 36.0–46.0)
Hemoglobin: 11.2 g/dL — ABNORMAL LOW (ref 12.0–15.0)
RDW: 13.4 % (ref 11.5–15.5)
WBC: 4 10*3/uL (ref 4.0–10.5)

## 2011-04-18 LAB — CARDIOLIPIN ANTIBODIES, IGG, IGM, IGA
Anticardiolipin IgG: 3 GPL U/mL — ABNORMAL LOW (ref <23)
Anticardiolipin IgM: 2 MPL U/mL — ABNORMAL LOW (ref <11)

## 2011-04-18 LAB — PROTIME-INR: INR: 1.97 — ABNORMAL HIGH (ref 0.00–1.49)

## 2011-04-18 LAB — PROTEIN S, TOTAL: Protein S Ag, Total: 58 % — ABNORMAL LOW (ref 60–150)

## 2011-04-18 LAB — BETA-2-GLYCOPROTEIN I ABS, IGG/M/A: Beta-2 Glyco I IgG: 0 G Units (ref <20)

## 2011-04-18 LAB — PROTEIN C, TOTAL: Protein C, Total: 86 % (ref 72–160)

## 2011-04-18 LAB — PROTEIN S ACTIVITY: Protein S Activity: 26 % — ABNORMAL LOW (ref 69–129)

## 2011-04-18 NOTE — H&P (Signed)
NAMEJONE, PANEBIANCO                ACCOUNT NO.:  [id number]  MEDICAL RECORD NO.:  [id number]           PATIENT TYPE:  E  LOCATION:  [facility]                         FACILITY[facility]  [facility]  PHYSICIAN:  Michiel Cowboy, MDDATE OF BIRTH:  Mar 28, 1961  DATE OF ADMISSION:  04/16/2011 DATE OF DISCHARGE:                             HISTORY & PHYSICAL   PRIMARY CARE PROVIDER:  [facility], Dr. Joaquin Courts.  CHIEF COMPLAINT:  Right knee pain and right thigh pain.  HISTORY OF PRESENT ILLNESS:  The patient is a 50 year old female who on March 06, 2011, underwent right knee replacement.  She has been complaining about right calf pain and tenderness and prior to her discharge on the 22nd had a vascular ultrasound done that showed a DVT. From then on, she went to rehab facility.  She states that while there she continued to have pain in her right calf and right thigh which she reports was out of proportion to her knee surgery.  She states that the repeat imaging was done that did show blood clot at which point she was started on what sounds like Coumadin and probably Lovenox.  This Wednesday she was discharged to home from rehab facility with still some Lovenox shots left which she finished on Friday which is 2 days ago. She was continuing to take her Coumadin.  Her Coumadin level was checked while she was in the rehab center as well as rechecked on Friday and was therapeutic.  She thinks it was 2.7.  She has not changed her diet since, she has not been taking any new medications but she does continue to have pain and swelling in her right leg and she felt that this may be getting worse.  She had a little bit of anxiety.  She also feels that she might be getting more short of breath than usual, although she did not volunteer this information, it was something that I asked her about. She was evaluated by home nurse who told her to come to emergency department and be further evaluated.  She had a  repeat imaging done that showed age indeterminate DVT in the right posterior tibial and popliteal veins and acute occlusive DVT throughout the femoral veins and nonocclusive acute DVT in the common femoral vein.  All this was on the right.  At this point, her INR was noted to be subtherapeutic at 1.79. The patient states that she has been taking her Coumadin.  The triad hospitalist was called for admission.  She already received Lovenox in the emergency department.  REVIEW OF SYMPTOMS:  Otherwise, review of systems, no chest pain, no fevers, no chills.  She does endorse a lot of anxiety and a lot of pain in her right leg.  Rest of review of systems is negative.  PAST MEDICAL HISTORY:  Significant for anxiety, asthma, COPD, recent history of DVT, recent history of right knee replacement, depression, ostearthritis, history of urinary retention, and she has remote history of cocaine abuse.  The patient states that she has history of seizures which she describes as absent seizures and she thinks she had had some while  she was on rehab.  She does not describe having any grand mal seizures.  SOCIAL HISTORY:  The patient does not endorse current drug use, has remote cocaine abuse history.  Does not drink.  A former smoker.  FAMILY HISTORY:  No blood clotting disorders in the family.  Otherwise unremarkable.  ALLERGIES:  To PENICILLIN.  MEDICATIONS:  The patient cannot recollect what she is actually taking and there is nobody at home right now to consult for medical list.  Per her H and P from February 23, 2011, she is supposed to be on Dilantin 100 mg 2 daily, Cetirizine 10 mg daily, Qvar 0.08 mg inhaler daily, verapamil ER 240 mg daily, Ventolin 120 mcg 1 puff b.i.d., Cymbalta unknown dose daily, trazodone 100 mg daily, salicylate 750 mg t.i.d. as needed, Flexeril 10 mg b.i.d. and hydrocodone 5/500 one b.i.d., again not sure what is her most up to date medication list at this  point.  PHYSICAL EXAMINATION:  VITAL SIGNS:  Temperature 98.4, blood pressure 125/86, pulse 87, respiration 16, satting 99% on room air. GENERAL:  The patient appears to be in no acute distress.  Head nontraumatic.  Moist mucous membranes. LUNGS:  Clear to auscultation bilaterally. HEART:  Regular rate and rhythm.  No murmurs appreciated. ABDOMEN:  Slightly obese but nontender. EXTREMITIES:  Lower extremities, I do not appreciate any swelling.  Scar over right knee appears to be well healing.  There is some tenderness of calf on the right as well as thigh region.  She is wearing TED hose bilaterally. NEUROLOGIC:  Grossly intact.  The patient does appear to be somewhat anxious and flustered.  LABORATORY DATA:  White blood cell count 6.4, hemoglobin 11.8, sodium 140, potassium 3.9, creatinine 0.73.  INR 1.79.  ASSESSMENT/PLAN:  This is a 50 year old female with history of right leg deep vein thrombosis  after her right knee replacement.  She was supposed to be taking Coumadin and her Lovenox was discontinued on the Friday since she was therapeutic on her Coumadin.  Today she presents with some worsening of symptoms and image study showing acute DVT.  Her prior diagnosis of DVT was about 2 weeks ago, it is hard to determine if this is a propagation of her original blood clots or is it truly failure of Coumadin, I am not convinced since she is actually subtherapeutic at this point.  Will admit until we can come up with a suggestion for her treatment.  Will go ahead and restart her on Lovenox and continue her Coumadin per pharmacy.  She may benefit from hematology consult to see if whether she would benefit from again attempts of continuing her Coumadin and not concerned this with fever failure since she was subtherapeutic versus switching her to something different like Arixtra versus placing a filter, which again will be more if this was a true Coumadin failure.  Given some shortness  of breath, we check CT angio of the chest to rule out PE although she denies any chest pain.  History of apparent hypertension.  Will continue her verapamil.  History of asthma/chronic obstructive pulmonary disease.  Continue albuterol as needed.  This is currently stable.  History of seizure disorder.  We will check Dilantin level, we will attempt to figure out what is her current Dilantin dose or if this has changed.  She herself is quite unsure.  For right now we will continue the Dilantin which was dictated from beginning of April since she has not endorsed any changes in  her dosing, this may need to be adjusted accordingly.  Prophylaxis.  We will write for Protonix.  She should be on Lovenox and Coumadin for treatment of her DVT.     Michiel Cowboy, MD     AVD/MEDQ  D:  04/16/2011  T:  04/16/2011  Job:  [id number]  Electronically Signed by Therisa Doyne MD on 04/18/2011 06:19:42 AM

## 2011-04-19 LAB — PROTIME-INR: Prothrombin Time: 25.7 seconds — ABNORMAL HIGH (ref 11.6–15.2)

## 2011-04-19 LAB — BASIC METABOLIC PANEL
Calcium: 8.7 mg/dL (ref 8.4–10.5)
GFR calc Af Amer: >60 mL/min (ref >60)
GFR calc non Af Amer: >60 mL/min (ref >60)
Sodium: 138 mEq/L (ref 135–145)

## 2011-04-19 LAB — FACTOR 5 LEIDEN

## 2011-04-20 LAB — CBC
Platelets: 329 10*3/uL (ref 150–400)
RBC: 4.02 MIL/uL (ref 3.87–5.11)
WBC: 3.6 10*3/uL — ABNORMAL LOW (ref 4.0–10.5)

## 2011-04-20 LAB — PROTHROMBIN GENE MUTATION

## 2011-04-20 LAB — PROTIME-INR: Prothrombin Time: 22.1 seconds — ABNORMAL HIGH (ref 11.6–15.2)

## 2011-04-21 LAB — PROTIME-INR: Prothrombin Time: 22 seconds — ABNORMAL HIGH (ref 11.6–15.2)

## 2011-04-23 LAB — CBC
HCT: 37.6 % (ref 36.0–46.0)
MCHC: 31.1 g/dL (ref 30.0–36.0)
MCV: 89.3 fL (ref 78.0–100.0)
RDW: 13.6 % (ref 11.5–15.5)
WBC: 3.8 10*3/uL — ABNORMAL LOW (ref 4.0–10.5)

## 2011-05-05 ENCOUNTER — Encounter (HOSPITAL_COMMUNITY): Payer: Medicaid Other

## 2011-05-05 ENCOUNTER — Other Ambulatory Visit: Payer: Self-pay | Admitting: Anesthesiology

## 2011-05-05 ENCOUNTER — Other Ambulatory Visit: Payer: Self-pay | Admitting: Orthopedic Surgery

## 2011-05-05 LAB — BASIC METABOLIC PANEL
CO2: 26 mEq/L (ref 19–32)
Chloride: 108 mEq/L (ref 96–112)
Creatinine, Ser: 0.63 mg/dL (ref 0.50–1.10)
Potassium: 4.2 mEq/L (ref 3.5–5.1)

## 2011-05-05 LAB — APTT: aPTT: 44 seconds — ABNORMAL HIGH (ref 24–37)

## 2011-05-05 LAB — CBC
HCT: 40.1 % (ref 36.0–46.0)
MCV: 89.3 fL (ref 78.0–100.0)
RDW: 13.4 % (ref 11.5–15.5)
WBC: 3.3 10*3/uL — ABNORMAL LOW (ref 4.0–10.5)

## 2011-05-05 LAB — SURGICAL PCR SCREEN
MRSA, PCR: NEGATIVE
Staphylococcus aureus: NEGATIVE

## 2011-05-10 ENCOUNTER — Ambulatory Visit (HOSPITAL_COMMUNITY)
Admission: RE | Admit: 2011-05-10 | Discharge: 2011-05-11 | Disposition: A | Discharge status: Home / Self Care | Payer: Medicaid Other | Source: Ambulatory Visit | Attending: Orthopedic Surgery | Admitting: Orthopedic Surgery

## 2011-05-10 DIAGNOSIS — G8918 Other acute postprocedural pain: Secondary | ICD-10-CM | POA: Insufficient documentation

## 2011-05-10 DIAGNOSIS — Z01812 Encounter for preprocedural laboratory examination: Secondary | ICD-10-CM | POA: Insufficient documentation

## 2011-05-10 DIAGNOSIS — Z96659 Presence of unspecified artificial knee joint: Secondary | ICD-10-CM | POA: Insufficient documentation

## 2011-05-10 DIAGNOSIS — F411 Generalized anxiety disorder: Secondary | ICD-10-CM | POA: Insufficient documentation

## 2011-05-10 DIAGNOSIS — M25669 Stiffness of unspecified knee, not elsewhere classified: Secondary | ICD-10-CM | POA: Insufficient documentation

## 2011-05-10 DIAGNOSIS — J45909 Unspecified asthma, uncomplicated: Secondary | ICD-10-CM | POA: Insufficient documentation

## 2011-05-10 DIAGNOSIS — I10 Essential (primary) hypertension: Secondary | ICD-10-CM | POA: Insufficient documentation

## 2011-05-10 LAB — PROTIME-INR
INR: 3.77 — ABNORMAL HIGH (ref 0.00–1.49)
Prothrombin Time: 37.8 seconds — ABNORMAL HIGH (ref 11.6–15.2)

## 2011-05-11 LAB — CBC
HCT: 34 % — ABNORMAL LOW (ref 36.0–46.0)
Platelets: 191 10*3/uL (ref 150–400)
RDW: 13.7 % (ref 11.5–15.5)
WBC: 4.6 10*3/uL (ref 4.0–10.5)

## 2011-05-11 LAB — BASIC METABOLIC PANEL
Calcium: 8.7 mg/dL (ref 8.4–10.5)
Creatinine, Ser: 0.82 mg/dL (ref 0.50–1.10)
GFR calc Af Amer: >60 mL/min (ref >60)
GFR calc non Af Amer: >60 mL/min (ref >60)
Sodium: 139 mEq/L (ref 135–145)

## 2011-05-11 LAB — PROTIME-INR
INR: 3.76 — ABNORMAL HIGH (ref 0.00–1.49)
Prothrombin Time: 37.7 seconds — ABNORMAL HIGH (ref 11.6–15.2)

## 2011-05-12 NOTE — Op Note (Signed)
  NAMEWANISHA, SHIROMA                ACCOUNT NO.:  [id number]  MEDICAL RECORD NO.:  [id number]  LOCATION:  1618                         FACILITY:  [facility]  PHYSICIAN:  Madlyn Frankel. Charlann Boxer, M.D.  DATE OF BIRTH:  1961-02-21  DATE OF PROCEDURE:  05/10/2011 DATE OF DISCHARGE:                              OPERATIVE REPORT   PREOPERATIVE DIAGNOSIS:  Right total knee replacement with postoperative pain and stiffness.  POSTOPERATIVE DIAGNOSIS:  Right total knee replacement with postoperative pain and stiffness.  PROCEDURES: 1. Right knee examination under anesthesia. 2. Manipulation under anesthesia.  SURGEON:  Madlyn Frankel. Charlann Boxer, MD  ASSISTANT:  Surgical team.  ANESTHESIA:  General plus a postoperative regional femoral nerve block.  COMPLICATIONS:  None.  SPECIMENS:  None.  INDICATION FOR THE PROCEDURE:  Ms. Zaucha is a 50 year old female, who is now about a month and a half or two months out from a total knee replacement on her right side.  Her postoperative course was complicated by difficulty with managing pain as well as some postoperative issues with urinary retention and a blood clot limiting her overall activity.  She presented to the office with very limited range of motion.  I felt that it was very important that we at this point proceed with manipulation and try to maximize her overall recovery.  After reviewing the risks and benefits of the procedure, consent was obtained for above.  PROCEDURE IN DETAIL:  Patient was brought to the operative theater. Once adequate anesthesia was established, initially just a general anesthetic due to her anxiety level, the femoral nerve block was done postprocedure.  She was positioned on the stretcher.  A time-out was performed identifying the patient, planned procedure, and extremity. Examination revealed about a 5-degree flexion contracture and flexion only to about 30 degrees maybe.  At this point, I brought her foot on to my  shoulder and did a gentle extension manipulation and got a couple degrees of some lysis of adhesions and no complications.  This was only a gentle amount of work on extension.  I then applied pressure along her proximal tibia with her hip flexed and I was able to flex her knee back fairly easily to about 110 degrees.  There were some limitations due to her body habitus, but nonetheless I was able to get some range of motion improved.  Knee ligaments remained stable.  No palpable defects.  There was, even with some further effort, no significant improvement in her range of motion, thus the procedure was ended.  Once I was finished, ice was applied and the femoral nerve block was positioned.  She was then brought to the recovery room in stable condition, tolerating the procedure well.     Madlyn Frankel Charlann Boxer, M.D.     MDO/MEDQ  D:  05/10/2011  T:  05/10/2011  Job:  [id number]  Electronically Signed by Durene Romans M.D. on 05/12/2011 04:42:24 PM

## 2011-05-25 NOTE — Discharge Summary (Signed)
[name], Christensen                ACCOUNT NO.:  [id number]  MEDICAL RECORD NO.:  [id number]           PATIENT TYPE:  I  LOCATION:  1526                         FACILITY:  [facility]  PHYSICIAN:  Clydia Llano, MD       DATE OF BIRTH:  08-Oct-1961  DATE OF ADMISSION:  04/16/2011 DATE OF DISCHARGE:                              DISCHARGE SUMMARY   PRIMARY CARE PHYSICIAN:  [facility].  REASON FOR ADMISSION:  Right knee and thigh pain.  DISCHARGE DIAGNOSES: 1. Right lower extremity deep vein thrombosis. 2. Status post recent total knee arthroplasty 3. Chronic obstructive pulmonary disease. 4. Depression/anxiety. 5. Osteoarthritis. 6. Remote history of cocaine abuse. 7. Obesity. 8. Hypertension. 9. Seizure disorder.  BRIEF HISTORY EXAMINATION:  [name] is 50 year old African-American female with history of obesity, hypertension and seizure disorder who underwent right knee replacement in March 06, 2011.  Afterwards the patient was discharged to skilled nursing facility.  After 4 days, the patient came in complaining about right calf and thigh pain and tenderness.  The patient was discharged on the 22nd.  At that time she was complaining about some pain.  Doppler ultrasound was done before discharge from the hospital which was negative for DVT.  The patient was still complaining about that in the skilled nursing facility, so repeat of that showed acute DVT.  The patient was started on Lovenox and Coumadin.  Then was sent home.  The patient came in from home complaining about worsening of DVT and swelling as well as the pain. The patient admitted to the hospital for further medical evaluation.  BRIEF HOSPITAL COURSE: 1. Right lower extremity acute DVT.  The DVT is likely secondary to     recent orthopedic surgery with immobilization and the patient's     obesity can contribute to the high risk of VTE.  The patient upon     time of admission, we did not knew if she had new DVT or  just     propagation of the previous clot.  The previous Doppler ultrasound     is not available for comparison.  At the time of admission, the     patient was presented with subtherapeutic INR.  At that time the     patient was taking 6 mg of Coumadin.  The patient was placed on     Lovenox bridge and was put back on the Coumadin.  The patient was     difficult to anticoagulate because she needs higher dose.  The     patient at the time of discharge will be discharged on 10 mg of     Coumadin to take every day.  An INR will be drawn on Wednesday April 26, 2011, and will be sent to her primary care physician to adjust     the dosage of the Coumadin. 2. Recent right total knee replacement.  The patient's physical     therapy, occupational therapy was continued throughout the hospital     stay, with the recommended home health physical therapy to be     continued.  The  patient to follow up with Dr. Charlann Boxer. 3. Hypertension.  Blood pressure was decently controlled during the     hospital stay.  Her medication was continued without any     disturbance. 4. Seizure disorder.  The patient on Dilantin.  The patient was very     stable.  No changes in medication.  RADIOLOGY: 1. CT angio of the chest on Apr 16, 2011, showed no evidence of PE,     minimal basilar dependent atelectasis. 2. Doppler ultrasound on Apr 16, 2011, showed finding consistent with     acute deep venous thrombosis involving the right lower extremity.  DISCHARGE INSTRUCTIONS: 1. Disposition:  Home with Home Health Service. 2. Activity:  As tolerated. 3. Diet:  Heart-healthy diet.  DISCHARGE MEDICATIONS: 1. Coumadin 10 mg p.o. every evening. 2. Cetirizine 10 mg p.o. daily. 3. Flexeril 10 mg p.o. b.i.d. 4. Cymbalta 30 mg p.o. daily. 5. Dilantin 100 mg extended release p.o. b.i.d. 6. Docusate 100 mg p.o. b.i.d. 7. Ferrous sulfate 325 mg p.o. b.i.d. 8. Robaxin 500 mg every 6 hours as needed. 9. Oxycodone IR 5 mg 1-2  tablets every 4 hours as needed for pain,     prescription for 30 tablets were was given 10.Polyethylene glycol 17 g daily as needed for bowel movement. 11.Trazodone 100 mg daily at bedtime. 12.Albuterol inhaler 2 puffs inhaled b.i.d. 13.Verapamil ER 240 mg every evening.     Clydia Llano, MD     ME/MEDQ  D:  04/23/2011  T:  04/23/2011  Job:  [id number]  cc:   Madlyn Frankel Charlann Boxer, M.D. Fax: [phone]  [facility] Fax: [phone]  Electronically Signed by Clydia Llano  on 05/25/2011 01:37:08 PM

## 2011-06-05 ENCOUNTER — Ambulatory Visit: Payer: Medicaid Other | Attending: Orthopedic Surgery | Admitting: Rehabilitative and Restorative Service Providers"

## 2011-06-05 DIAGNOSIS — IMO0001 Reserved for inherently not codable concepts without codable children: Secondary | ICD-10-CM | POA: Insufficient documentation

## 2011-06-05 DIAGNOSIS — R262 Difficulty in walking, not elsewhere classified: Secondary | ICD-10-CM | POA: Insufficient documentation

## 2011-06-05 DIAGNOSIS — M6281 Muscle weakness (generalized): Secondary | ICD-10-CM | POA: Insufficient documentation

## 2011-06-05 DIAGNOSIS — Z96659 Presence of unspecified artificial knee joint: Secondary | ICD-10-CM | POA: Insufficient documentation

## 2011-06-05 DIAGNOSIS — M25669 Stiffness of unspecified knee, not elsewhere classified: Secondary | ICD-10-CM | POA: Insufficient documentation

## 2011-06-12 ENCOUNTER — Emergency Department (HOSPITAL_COMMUNITY)
Admission: EM | Admit: 2011-06-12 | Discharge: 2011-06-13 | Disposition: A | Discharge status: Home / Self Care | Payer: Medicaid Other | Attending: Emergency Medicine | Admitting: Emergency Medicine

## 2011-06-12 ENCOUNTER — Encounter: Payer: Medicaid Other | Admitting: Physical Therapy

## 2011-06-12 DIAGNOSIS — R51 Headache: Secondary | ICD-10-CM | POA: Insufficient documentation

## 2011-06-12 DIAGNOSIS — F411 Generalized anxiety disorder: Secondary | ICD-10-CM | POA: Insufficient documentation

## 2011-06-12 DIAGNOSIS — Z86718 Personal history of other venous thrombosis and embolism: Secondary | ICD-10-CM | POA: Insufficient documentation

## 2011-06-12 DIAGNOSIS — R110 Nausea: Secondary | ICD-10-CM | POA: Insufficient documentation

## 2011-06-12 DIAGNOSIS — F329 Major depressive disorder, single episode, unspecified: Secondary | ICD-10-CM | POA: Insufficient documentation

## 2011-06-12 DIAGNOSIS — Z79899 Other long term (current) drug therapy: Secondary | ICD-10-CM | POA: Insufficient documentation

## 2011-06-12 DIAGNOSIS — M2560 Stiffness of unspecified joint, not elsewhere classified: Secondary | ICD-10-CM | POA: Insufficient documentation

## 2011-06-12 DIAGNOSIS — J449 Chronic obstructive pulmonary disease, unspecified: Secondary | ICD-10-CM | POA: Insufficient documentation

## 2011-06-12 DIAGNOSIS — J4489 Other specified chronic obstructive pulmonary disease: Secondary | ICD-10-CM | POA: Insufficient documentation

## 2011-06-12 DIAGNOSIS — F3289 Other specified depressive episodes: Secondary | ICD-10-CM | POA: Insufficient documentation

## 2011-06-13 ENCOUNTER — Emergency Department (HOSPITAL_COMMUNITY): Payer: Medicaid Other

## 2011-06-13 ENCOUNTER — Encounter: Payer: Medicaid Other | Admitting: Physical Therapy

## 2011-06-13 ENCOUNTER — Encounter (HOSPITAL_COMMUNITY): Payer: Self-pay

## 2011-06-13 LAB — PROTIME-INR
INR: 3.66 — ABNORMAL HIGH (ref 0.00–1.49)
Prothrombin Time: 36.9 s — ABNORMAL HIGH (ref 11.6–15.2)

## 2011-06-15 ENCOUNTER — Encounter: Payer: Medicaid Other | Admitting: Physical Therapy

## 2011-06-20 ENCOUNTER — Ambulatory Visit: Payer: Medicaid Other | Admitting: Rehabilitative and Restorative Service Providers"

## 2011-06-21 ENCOUNTER — Encounter: Payer: Medicaid Other | Admitting: Rehabilitative and Restorative Service Providers"

## 2011-06-22 ENCOUNTER — Encounter: Payer: Medicaid Other | Admitting: Rehabilitative and Restorative Service Providers"

## 2012-01-07 ENCOUNTER — Encounter (HOSPITAL_COMMUNITY): Payer: Self-pay | Admitting: *Deleted

## 2012-01-07 ENCOUNTER — Emergency Department (HOSPITAL_COMMUNITY)
Admission: EM | Admit: 2012-01-07 | Discharge: 2012-01-07 | Disposition: A | Discharge status: Home / Self Care | Payer: Medicaid Other | Attending: Emergency Medicine | Admitting: Emergency Medicine

## 2012-01-07 ENCOUNTER — Emergency Department (HOSPITAL_COMMUNITY): Payer: Medicaid Other

## 2012-01-07 DIAGNOSIS — M545 Low back pain, unspecified: Secondary | ICD-10-CM | POA: Insufficient documentation

## 2012-01-07 DIAGNOSIS — M5431 Sciatica, right side: Secondary | ICD-10-CM

## 2012-01-07 DIAGNOSIS — IMO0001 Reserved for inherently not codable concepts without codable children: Secondary | ICD-10-CM | POA: Insufficient documentation

## 2012-01-07 DIAGNOSIS — M25559 Pain in unspecified hip: Secondary | ICD-10-CM | POA: Insufficient documentation

## 2012-01-07 DIAGNOSIS — M79609 Pain in unspecified limb: Secondary | ICD-10-CM | POA: Insufficient documentation

## 2012-01-07 DIAGNOSIS — I10 Essential (primary) hypertension: Secondary | ICD-10-CM | POA: Insufficient documentation

## 2012-01-07 DIAGNOSIS — M533 Sacrococcygeal disorders, not elsewhere classified: Secondary | ICD-10-CM | POA: Insufficient documentation

## 2012-01-07 DIAGNOSIS — M5430 Sciatica, unspecified side: Secondary | ICD-10-CM | POA: Insufficient documentation

## 2012-01-07 DIAGNOSIS — R269 Unspecified abnormalities of gait and mobility: Secondary | ICD-10-CM | POA: Insufficient documentation

## 2012-01-07 HISTORY — DX: Essential (primary) hypertension: I10

## 2012-01-07 HISTORY — DX: Depression, unspecified: F32.A

## 2012-01-07 HISTORY — DX: Major depressive disorder, single episode, unspecified: F32.9

## 2012-01-07 HISTORY — DX: Unspecified osteoarthritis, unspecified site: M19.90

## 2012-01-07 MED ORDER — OXYCODONE-ACETAMINOPHEN 5-325 MG PO TABS
1.0000 | ORAL_TABLET | Freq: Once | ORAL | Status: AC
  Administered 2012-01-07: 1 via ORAL
  Filled 2012-01-07: qty 1

## 2012-01-07 MED ORDER — OXYCODONE-ACETAMINOPHEN 5-325 MG PO TABS: 1.0000 | ORAL_TABLET | ORAL | Status: AC | PRN

## 2012-01-07 MED ORDER — PREDNISONE 10 MG PO TABS: 40.0000 mg | ORAL_TABLET | Freq: Every day | ORAL | Status: DC

## 2012-01-07 NOTE — ED Provider Notes (Signed)
History     CSN: [id number]  Arrival date & time 01/07/12  1223   First MD Initiated Contact with Patient 01/07/12 1230      Chief Complaint  Patient presents with  . Hip Pain    right  . Back Pain    lower, bilateral    (Consider location/radiation/quality/duration/timing/severity/associated sxs/prior treatment) Patient is a 51 y.o. female presenting with back pain. The history is provided by the patient.  Back Pain  This is a new problem. The current episode started yesterday. The problem occurs constantly. The problem has been gradually improving. The pain is associated with no known injury. The pain is present in the lumbar spine. The quality of the pain is described as shooting. The pain radiates to the right thigh. The pain is at a severity of 8/10. The pain is moderate. The symptoms are aggravated by certain positions. Pertinent negatives include no numbness and no weakness.  Pt states pain started yesterday. States took vicodin and flexeril with no relief. Stayed in bed all day. States pain radiates into right leg. Hx of right knee replacement. Remote history of similar pain. Denies recent injuries. Denies numbness or weakness in legs. Denies fever. Denies loss of bowels, urinary incontinence or retention.   Past Medical History  Diagnosis Date  . Hypertension     Past Surgical History  Procedure Date  . Hernia repair   . Cesarean section   . Joint replacement     right TKA    No family history on file.  History  Substance Use Topics  . Smoking status: Never Smoker   . Smokeless tobacco: Not on file  . Alcohol Use: No    OB History    Grav Para Term Preterm Abortions TAB SAB Ect Mult Living                  Review of Systems  Constitutional: Negative.   HENT: Negative.   Eyes: Negative.   Respiratory: Negative.   Cardiovascular: Negative.   Gastrointestinal: Negative.   Genitourinary: Negative.   Musculoskeletal: Positive for back pain and gait  problem.  Skin: Negative.   Neurological: Negative for weakness and numbness.  Psychiatric/Behavioral: Negative.     Allergies  Penicillins  Home Medications  No current outpatient prescriptions on file.  BP 141/86  Pulse 79  Temp(Src) 98 F (36.7 C) (Oral)  Resp 23  SpO2 100%  Physical Exam  Nursing note and vitals reviewed. Constitutional: She is oriented to person, place, and time. She appears well-developed and well-nourished. No distress.       Morbidly obese  HENT:  Head: Normocephalic and atraumatic.  Eyes: Conjunctivae are normal.  Neck: Neck supple.  Cardiovascular: Normal rate, regular rhythm and normal heart sounds.   Pulmonary/Chest: Effort normal and breath sounds normal. No respiratory distress.  Musculoskeletal:       NO midline lumbar spine tenderness. Right SI tenderness extending into right buttocks. Pain with right straight leg raise.  Neurological: She is alert and oriented to person, place, and time. She has normal reflexes.       Normal sensation down bilateral LE. Pt able to dorsiflex bilateral feet and toes.   Skin: Skin is warm and dry.  Psychiatric: She has a normal mood and affect.    ED Course  Procedures (including critical care time)  Dg Lumbar Spine Complete  01/07/2012  *RADIOLOGY REPORT*  Clinical Data: Low back pain radiating into both hips.  More pain in the right  hip.  LUMBAR SPINE - COMPLETE 4+ VIEW  Comparison: 06/23/2008 lumbar MRI.  Findings: Five lumbar type vertebral bodies.  There are no pars defects identified.  Intervertebral disc spaces appear preserved. No fracture.  Lower lumbar facet arthrosis.  IMPRESSION: No acute osseous abnormality or interval change from prior MRI. Lower lumbar facet arthrosis.  Original Report Authenticated By: Andreas Newport, M.D.   Negative lumbar spine. Pt has no neuro deficits. No fever. No loss of bowels or urinary retention/incontinence. No signs of cauda equina or cord compression. Pt is  ambulatory. Will start on pain medications and follow up. Pt is morbidly obese, which I think is partially playing a role in pt's chronic pain in her knees and back.   No diagnosis found.    MDM          Lottie Mussel, PA 01/07/12 1529

## 2012-01-07 NOTE — Discharge Instructions (Signed)
Your x-ray today is unchanged from the previous one. Stop vicodin. Take percocet as prescribed as needed for severe pain. Continue your muscle relaxant. Take prednisone as prescribed for inflammation. Follow up with your primary care doctor or orthopedics as soon as able.   Sciatica Sciatica is a weakness and/or changes in sensation (tingling, jolts, hot and cold, numbness) along the path the sciatic nerve travels. Irritation or damage to lumbar nerve roots is often also referred to as lumbar radiculopathy.  Lumbar radiculopathy (Sciatica) is the most common form of this problem. Radiculopathy can occur in any of the nerves coming out of the spinal cord. The problems caused depend on which nerves are involved. The sciatic nerve is the large nerve supplying the branches of nerves going from the hip to the toes. It often causes a numbness or weakness in the skin and/or muscles that the sciatic nerve serves. It also may cause symptoms (problems) of pain, burning, tingling, or electric shock-like feelings in the path of this nerve. This usually comes from injury to the fibers that make up the sciatic nerve. Some of these symptoms are low back pain and/or unpleasant feelings in the following areas:  From the mid-buttock down the back of the leg to the back of the knee.   And/or the outside of the calf and top of the foot.   And/or behind the inner ankle to the sole of the foot.  CAUSES   Herniated or slipped disc. Discs are the little cushions between the bones in the back.   Pressure by the piriformis muscle in the buttock on the sciatic nerve (Piriformis Syndrome).   Misalignment of the bones in the lower back and buttocks (Sacroiliac Joint Derangement).   Narrowing of the spinal canal that puts pressure on or pinches the fibers that make up the sciatic nerve.   A slipped vertebra that is out of line with those above or beneath it.   Abnormality of the nervous system itself so that nerve fibers  do not transmit signals properly, especially to feet and calves (neuropathy).   Tumor (this is rare).  Your caregiver can usually determine the cause of your sciatica and begin the treatment most likely to help you. TREATMENT  Taking over-the-counter painkillers, physical therapy, rest, exercise, spinal manipulation, and injections of anesthetics and/or steroids may be used. Surgery, acupuncture, and Yoga can also be effective. Mind over matter techniques, mental imagery, and changing factors such as your bed, chair, desk height, posture, and activities are other treatments that may be helpful. You and your caregiver can help determine what is best for you. With proper diagnosis, the cause of most sciatica can be identified and removed. Communication and cooperation between your caregiver and you is essential. If you are not successful immediately, do not be discouraged. With time, a proper treatment can be found that will make you comfortable. HOME CARE INSTRUCTIONS   If the pain is coming from a problem in the back, applying ice to that area for 15 to 20 minutes, 3 to 4 times per day while awake, may be helpful. Put the ice in a plastic bag. Place a towel between the bag of ice and your skin.   You may exercise or perform your usual activities if these do not aggravate your pain, or as suggested by your caregiver.   Only take over-the-counter or prescription medicines for pain, discomfort, or fever as directed by your caregiver.   If your caregiver has given you a follow-up appointment,  it is very important to keep that appointment. Not keeping the appointment could result in a chronic or permanent injury, pain, and disability. If there is any problem keeping the appointment, you must call back to this facility for assistance.  SEEK IMMEDIATE MEDICAL CARE IF:   You experience loss of control of bowel or bladder.   You have increasing weakness in the trunk, buttocks, or legs.   There is  numbness in any areas from the hip down to the toes.   You have difficulty walking or keeping your balance.   You have any of the above, with fever or forceful vomiting.  Document Released: 10/31/2001 Document Revised: 07/19/2011 Document Reviewed: 06/19/2008 [facility] Patient Information 2012 [place], [place].

## 2012-01-07 NOTE — ED Notes (Signed)
Gradual onset right hip pain since yesterday. Historical Data: Has history of lower back pain and similar pain to left hip. Had Right TKA last year and developed multiple blood clots and then had to go back to surgery for further work on right knee. Does not want to return to surgeon that did right knee. Looking for a new one.

## 2012-01-08 NOTE — ED Provider Notes (Signed)
Medical screening examination/treatment/procedure(s) were performed by non-physician practitioner and as supervising physician I was immediately available for consultation/collaboration.  Katrisha Segall M Zarai Orsborn, MD 01/08/12 2203 

## 2012-03-24 ENCOUNTER — Emergency Department (HOSPITAL_COMMUNITY)
Admission: EM | Admit: 2012-03-24 | Discharge: 2012-03-24 | Disposition: A | Discharge status: Home / Self Care | Payer: Medicaid Other | Attending: Emergency Medicine | Admitting: Emergency Medicine

## 2012-03-24 ENCOUNTER — Encounter (HOSPITAL_COMMUNITY): Payer: Self-pay

## 2012-03-24 DIAGNOSIS — M79606 Pain in leg, unspecified: Secondary | ICD-10-CM

## 2012-03-24 DIAGNOSIS — M129 Arthropathy, unspecified: Secondary | ICD-10-CM | POA: Insufficient documentation

## 2012-03-24 DIAGNOSIS — F329 Major depressive disorder, single episode, unspecified: Secondary | ICD-10-CM | POA: Insufficient documentation

## 2012-03-24 DIAGNOSIS — I10 Essential (primary) hypertension: Secondary | ICD-10-CM | POA: Insufficient documentation

## 2012-03-24 DIAGNOSIS — F3289 Other specified depressive episodes: Secondary | ICD-10-CM | POA: Insufficient documentation

## 2012-03-24 DIAGNOSIS — M79609 Pain in unspecified limb: Secondary | ICD-10-CM

## 2012-03-24 MED ORDER — OXYCODONE-ACETAMINOPHEN 5-325 MG PO TABS
2.0000 | ORAL_TABLET | Freq: Once | ORAL | Status: AC
  Administered 2012-03-24: 2 via ORAL
  Filled 2012-03-24: qty 2

## 2012-03-24 MED ORDER — OXYCODONE-ACETAMINOPHEN 5-325 MG PO TABS: 2.0000 | ORAL_TABLET | ORAL | Status: AC | PRN

## 2012-03-24 NOTE — Discharge Instructions (Signed)
Do not take her dose of Coumadin tonight as your INR was elevated at 5.19. Followup with your Coumadin clinic next week. Return here for any signs of bleeding

## 2012-03-24 NOTE — ED Notes (Signed)
Pt w/ hx of blood clots c/o pain to lt lower leg.  Takes coumadin.  States that pain feels like her other blood clots.  Denies ever having a PE but is c/o sob.  Hx of asthma but states this feels different.  No obvious color change, deformity, or heat difference in lt leg.  Pt breathing unlabored.  Sp02 99% on RA.

## 2012-03-24 NOTE — Progress Notes (Signed)
VASCULAR LAB PRELIMINARY  PRELIMINARY  PRELIMINARY  PRELIMINARY  Left lower extremity venous Doppler completed.    Preliminary report:  There is no DVT or SVT noted in the left lower extremity.  Sherren Kerns [place], 03/24/2012, 5:02 PM

## 2012-03-24 NOTE — ED Provider Notes (Signed)
History     CSN: [id number]  Arrival date & time 03/24/12  1340   First MD Initiated Contact with Patient 03/24/12 1504      Chief Complaint  Patient presents with  . Leg Pain    (Consider location/radiation/quality/duration/timing/severity/associated sxs/prior treatment) Patient is a 51 y.o. female presenting with leg pain. The history is provided by the patient.  Leg Pain   c/o left calf pain x 1 week--h/o dvt and this feels similar--denies chest pain or sob--no recent h/o injury--pain worse with walking and better with nothing  Past Medical History  Diagnosis Date  . Hypertension   . Depression   . Arthritis     Past Surgical History  Procedure Date  . Hernia repair   . Cesarean section   . Joint replacement     right TKA    History reviewed. No pertinent family history.  History  Substance Use Topics  . Smoking status: Never Smoker   . Smokeless tobacco: Not on file  . Alcohol Use: No    OB History    Grav Para Term Preterm Abortions TAB SAB Ect Mult Living                  Review of Systems  All other systems reviewed and are negative.    Allergies  Penicillins  Home Medications   Current Outpatient Rx  Name Route Sig Dispense Refill  . ALBUTEROL SULFATE HFA 108 (90 BASE) MCG/ACT IN AERS Inhalation Inhale 2 puffs into the lungs every 6 (six) hours as needed. For shortness of breath and wheezing    . CETIRIZINE HCL 10 MG PO TABS Oral Take 10 mg by mouth daily.    . CYCLOBENZAPRINE HCL 10 MG PO TABS Oral Take 10 mg by mouth 2 (two) times daily with a meal.    . DICLOFENAC SODIUM 1 % TD GEL Topical Apply topically.    . DULOXETINE HCL 60 MG PO CPEP Oral Take 60 mg by mouth daily.    Marland Kitchen HYDROCODONE-ACETAMINOPHEN 5-500 MG PO TABS Oral Take 1 tablet by mouth 2 (two) times daily. Patient states that she takes mainly as needed    . PHENYTOIN SODIUM EXTENDED 100 MG PO CAPS Oral Take 200 mg by mouth at bedtime.     Marland Kitchen SALSALATE 750 MG PO TABS Oral Take 750  mg by mouth 3 (three) times daily.    . TRAZODONE HCL 100 MG PO TABS Oral Take 100 mg by mouth at bedtime.     Marland Kitchen VERAPAMIL HCL ER 240 MG PO CP24 Oral Take 240 mg by mouth at bedtime.     . WARFARIN SODIUM 10 MG PO TABS Oral Take 10 mg by mouth daily.      BP 131/84  Pulse 79  Temp(Src) 97.6 F (36.4 C) (Oral)  Resp 18  SpO2 100%  Physical Exam  Nursing note and vitals reviewed. Constitutional: She is oriented to person, place, and time. Vital signs are normal. She appears well-developed and well-nourished.  Non-toxic appearance. No distress.  HENT:  Head: Normocephalic and atraumatic.  Eyes: Conjunctivae, EOM and lids are normal. Pupils are equal, round, and reactive to light.  Neck: Normal range of motion. Neck supple. No tracheal deviation present. No mass present.  Cardiovascular: Normal rate, regular rhythm and normal heart sounds.  Exam reveals no gallop.   No murmur heard. Pulmonary/Chest: Effort normal and breath sounds normal. No stridor. No respiratory distress. She has no decreased breath sounds. She has no  wheezes. She has no rhonchi. She has no rales.  Abdominal: Soft. Normal appearance and bowel sounds are normal. She exhibits no distension. There is no tenderness. There is no rebound and no CVA tenderness.  Musculoskeletal: Normal range of motion. She exhibits no edema and no tenderness.       Left lower leg: She exhibits tenderness. She exhibits no swelling and no edema.  Neurological: She is alert and oriented to person, place, and time. She has normal strength. No cranial nerve deficit or sensory deficit. GCS eye subscore is 4. GCS verbal subscore is 5. GCS motor subscore is 6.  Skin: Skin is warm and dry. No abrasion and no rash noted.  Psychiatric: She has a normal mood and affect. Her speech is normal and behavior is normal.    ED Course  Procedures (including critical care time)   Labs Reviewed  PROTIME-INR   No results found.   No diagnosis  found.    MDM  Doppler was negative for DVT. Patient's INR was elevated and she was told to hold her next dose of Coumadin and stop the Coumadin clinic. He was given Percocet for pain here and this was slightly better        Toy Baker, MD 03/24/12 1721

## 2012-03-24 NOTE — ED Notes (Signed)
Pt states some sob when ambulating

## 2012-03-24 NOTE — ED Notes (Signed)
Pt in from home with left leg pain since Saturday denies recent injury states pain radiates down calf states pain when walking pt also states hx of blood clots

## 2012-07-18 ENCOUNTER — Other Ambulatory Visit: Payer: Self-pay | Admitting: Internal Medicine

## 2012-07-18 DIAGNOSIS — Z1231 Encounter for screening mammogram for malignant neoplasm of breast: Secondary | ICD-10-CM

## 2012-07-19 ENCOUNTER — Ambulatory Visit
Admission: RE | Admit: 2012-07-19 | Discharge: 2012-07-19 | Disposition: A | Discharge status: Home / Self Care | Payer: Medicaid Other | Source: Ambulatory Visit | Attending: Internal Medicine | Admitting: Internal Medicine

## 2012-07-19 DIAGNOSIS — Z1231 Encounter for screening mammogram for malignant neoplasm of breast: Secondary | ICD-10-CM

## 2013-03-27 ENCOUNTER — Emergency Department (HOSPITAL_COMMUNITY): Payer: Medicaid Other

## 2013-03-27 ENCOUNTER — Encounter (HOSPITAL_COMMUNITY): Payer: Self-pay | Admitting: Nurse Practitioner

## 2013-03-27 ENCOUNTER — Emergency Department (HOSPITAL_COMMUNITY)
Admission: EM | Admit: 2013-03-27 | Discharge: 2013-03-27 | Disposition: A | Discharge status: Home / Self Care | Payer: Medicaid Other | Attending: Emergency Medicine | Admitting: Emergency Medicine

## 2013-03-27 DIAGNOSIS — I10 Essential (primary) hypertension: Secondary | ICD-10-CM | POA: Insufficient documentation

## 2013-03-27 DIAGNOSIS — R002 Palpitations: Secondary | ICD-10-CM | POA: Insufficient documentation

## 2013-03-27 DIAGNOSIS — Z8739 Personal history of other diseases of the musculoskeletal system and connective tissue: Secondary | ICD-10-CM | POA: Insufficient documentation

## 2013-03-27 DIAGNOSIS — F3289 Other specified depressive episodes: Secondary | ICD-10-CM | POA: Insufficient documentation

## 2013-03-27 DIAGNOSIS — Z7901 Long term (current) use of anticoagulants: Secondary | ICD-10-CM | POA: Insufficient documentation

## 2013-03-27 DIAGNOSIS — R0602 Shortness of breath: Secondary | ICD-10-CM | POA: Insufficient documentation

## 2013-03-27 DIAGNOSIS — Z79899 Other long term (current) drug therapy: Secondary | ICD-10-CM | POA: Insufficient documentation

## 2013-03-27 DIAGNOSIS — Z880 Allergy status to penicillin: Secondary | ICD-10-CM | POA: Insufficient documentation

## 2013-03-27 DIAGNOSIS — F329 Major depressive disorder, single episode, unspecified: Secondary | ICD-10-CM | POA: Insufficient documentation

## 2013-03-27 DIAGNOSIS — R011 Cardiac murmur, unspecified: Secondary | ICD-10-CM | POA: Insufficient documentation

## 2013-03-27 DIAGNOSIS — J441 Chronic obstructive pulmonary disease with (acute) exacerbation: Secondary | ICD-10-CM | POA: Insufficient documentation

## 2013-03-27 DIAGNOSIS — J45901 Unspecified asthma with (acute) exacerbation: Secondary | ICD-10-CM | POA: Insufficient documentation

## 2013-03-27 HISTORY — DX: Unspecified asthma, uncomplicated: J45.909

## 2013-03-27 HISTORY — DX: Cardiac murmur, unspecified: R01.1

## 2013-03-27 HISTORY — DX: Chronic obstructive pulmonary disease, unspecified: J44.9

## 2013-03-27 LAB — BASIC METABOLIC PANEL
BUN: 9 mg/dL (ref 6–23)
Chloride: 104 mEq/L (ref 96–112)
Creatinine, Ser: 0.97 mg/dL (ref 0.50–1.10)
Glucose, Bld: 112 mg/dL — ABNORMAL HIGH (ref 70–99)
Potassium: 3.7 mEq/L (ref 3.5–5.1)

## 2013-03-27 LAB — CBC
HCT: 44.3 % (ref 36.0–46.0)
Hemoglobin: 14.3 g/dL (ref 12.0–15.0)
MCH: 28 pg (ref 26.0–34.0)
MCHC: 32.3 g/dL (ref 30.0–36.0)
MCV: 86.9 fL (ref 78.0–100.0)
RBC: 5.1 MIL/uL (ref 3.87–5.11)

## 2013-03-27 LAB — PROTIME-INR: INR: 1.92 — ABNORMAL HIGH (ref 0.00–1.49)

## 2013-03-27 MED ORDER — OXYCODONE-ACETAMINOPHEN 5-325 MG PO TABS
1.0000 | ORAL_TABLET | Freq: Once | ORAL | Status: AC
  Administered 2013-03-27: 1 via ORAL
  Filled 2013-03-27: qty 1

## 2013-03-27 MED ORDER — IOHEXOL 350 MG/ML SOLN
100.0000 mL | Freq: Once | INTRAVENOUS | Status: AC | PRN
  Administered 2013-03-27: 100 mL via INTRAVENOUS

## 2013-03-27 NOTE — ED Notes (Signed)
Patient is resting comfortably. 

## 2013-03-27 NOTE — ED Notes (Signed)
Pt to XR

## 2013-03-27 NOTE — ED Notes (Signed)
MD resident at bedside

## 2013-03-27 NOTE — ED Notes (Signed)
MD at bedside. 

## 2013-03-27 NOTE — ED Provider Notes (Signed)
History     CSN: [id number]  Arrival date & time 03/27/13  1326   First MD Initiated Contact with Patient 03/27/13 1559      Chief Complaint  Patient presents with  . Irregular Heart Beat     Patient is a 52 y.o. female presenting with palpitations.  Palpitations  This is a new problem. The current episode started 6 to 12 hours ago. Episode frequency: frquently, intermittently. The problem has not changed since onset.Associated with: not associated with exercise, caffeine, drug use, or new medications. Associated symptoms include shortness of breath. Pertinent negatives include no diaphoresis, no fever, no malaise/fatigue, no numbness, no chest pain, no chest pressure, no near-syncope, no orthopnea, no PND, no abdominal pain, no nausea, no vomiting, no headaches, no back pain, no lower extremity edema, no dizziness, no weakness and no cough. She has tried nothing for the symptoms. Risk factors: hypertension, COPD. Her past medical history does not include heart disease, hyperthyroidism or valve disorder.   She has a H/o PE in approximately 2011 after surgery   Past Medical History  Diagnosis Date  . Hypertension   . Depression   . Arthritis   . COPD (chronic obstructive pulmonary disease)   . Asthma   . Heart murmur     Past Surgical History  Procedure Laterality Date  . Hernia repair    . Cesarean section    . Joint replacement      right TKA    History reviewed. No pertinent family history.  History  Substance Use Topics  . Smoking status: Never Smoker   . Smokeless tobacco: Not on file  . Alcohol Use: No    OB History   Grav Para Term Preterm Abortions TAB SAB Ect Mult Living                  Review of Systems  Constitutional: Negative for fever, chills, malaise/fatigue and diaphoresis.  HENT: Negative for congestion, rhinorrhea, neck pain and neck stiffness.   Respiratory: Positive for shortness of breath. Negative for cough and wheezing.   Cardiovascular:  Positive for palpitations. Negative for chest pain, orthopnea, leg swelling, PND and near-syncope.  Gastrointestinal: Negative for nausea, vomiting, abdominal pain and diarrhea.  Genitourinary: Negative for dysuria, urgency, frequency, flank pain, vaginal bleeding, vaginal discharge and difficulty urinating.  Musculoskeletal: Negative for back pain.  Skin: Negative for rash.  Neurological: Negative for dizziness, weakness, numbness and headaches.  All other systems reviewed and are negative.    Allergies  Penicillins  Home Medications   Current Outpatient Rx  Name  Route  Sig  Dispense  Refill  . albuterol (PROVENTIL HFA;VENTOLIN HFA) 108 (90 BASE) MCG/ACT inhaler   Inhalation   Inhale 2 puffs into the lungs every 6 (six) hours as needed. For shortness of breath and wheezing         . budesonide-formoterol (SYMBICORT) 160-4.5 MCG/ACT inhaler   Inhalation   Inhale 2 puffs into the lungs 2 (two) times daily.         . carisoprodol (SOMA) 350 MG tablet   Oral   Take 350 mg by mouth 2 (two) times daily.         . cetirizine (ZYRTEC) 10 MG tablet   Oral   Take 10 mg by mouth at bedtime.          . cyclobenzaprine (FLEXERIL) 10 MG tablet   Oral   Take 10 mg by mouth 2 (two) times daily with a meal.         .  diclofenac sodium (VOLTAREN) 1 % GEL   Topical   Apply 2 g topically daily as needed (muscle pain).          . DULoxetine (CYMBALTA) 60 MG capsule   Oral   Take 60 mg by mouth at bedtime.          . phenytoin (DILANTIN) 100 MG ER capsule   Oral   Take 200 mg by mouth at bedtime.          . simvastatin (ZOCOR) 20 MG tablet   Oral   Take 20 mg by mouth every evening.         . traMADol (ULTRAM) 50 MG tablet   Oral   Take 100 mg by mouth every 8 (eight) hours as needed for pain.         . traZODone (DESYREL) 100 MG tablet   Oral   Take 100 mg by mouth at bedtime.          . verapamil (VERELAN PM) 240 MG 24 hr capsule   Oral   Take 240 mg  by mouth at bedtime.          Marland Kitchen warfarin (COUMADIN) 10 MG tablet   Oral   Take 10 mg by mouth at bedtime.          Marland Kitchen warfarin (COUMADIN) 2.5 MG tablet   Oral   Take 2.5 mg by mouth at bedtime.           BP 165/110  Pulse 93  Temp(Src) 97.2 F (36.2 C) (Oral)  Resp 18  Ht 5' (1.524 m)  Wt 134 lb (60.782 kg)  BMI 26.17 kg/m2  SpO2 93%  Physical Exam  Nursing note and vitals reviewed. Constitutional: She is oriented to person, place, and time. She appears well-developed and well-nourished. No distress.  HENT:  Head: Normocephalic and atraumatic.  Mouth/Throat: Oropharynx is clear and moist.  Eyes: Conjunctivae and EOM are normal. Pupils are equal, round, and reactive to light. No scleral icterus.  Neck: Normal range of motion. Neck supple. No JVD present.  Cardiovascular: Normal rate, regular rhythm, normal heart sounds and intact distal pulses.  Exam reveals no gallop and no friction rub.   No murmur heard. Pulmonary/Chest: Effort normal and breath sounds normal. No respiratory distress. She has no wheezes. She has no rales.  Abdominal: Soft. Bowel sounds are normal. She exhibits no distension. There is no tenderness. There is no rebound and no guarding.  Musculoskeletal: She exhibits no edema.  Neurological: She is alert and oriented to person, place, and time. No cranial nerve deficit. She exhibits normal muscle tone. Coordination normal.  Skin: Skin is warm and dry. She is not diaphoretic.    ED Course  Procedures (including critical care time)  Labs Reviewed  BASIC METABOLIC PANEL - Abnormal; Notable for the following:    Glucose, Bld 112 (*)    GFR calc non Af Amer 66 (*)    GFR calc Af Amer 76 (*)    All other components within normal limits  PROTIME-INR - Abnormal; Notable for the following:    Prothrombin Time 21.2 (*)    INR 1.92 (*)    All other components within normal limits  PRO B NATRIURETIC PEPTIDE  CBC  POCT I-STAT TROPONIN I   Dg Chest 2  View  03/27/2013  *RADIOLOGY REPORT*  Clinical Data: Irregular heart rate accompanied by nausea.  CHEST - 2 VIEW  Comparison: CT chest 04/16/2011 and 02/21/2011.  Findings: Trachea  is midline.  Heart size normal. There may be scarring at the right costophrenic angle.  Lungs are otherwise clear.  No pleural fluid.  IMPRESSION: No acute findings.   Original Report Authenticated By: Leanna Battles, M.D.    Ct Angio Chest Pe W/cm &/or Wo Cm  03/27/2013  *RADIOLOGY REPORT*  Clinical Data: Shortness of breath and nausea.  CT ANGIOGRAPHY CHEST  Technique:  Multidetector CT imaging of the chest using the standard protocol during bolus administration of intravenous contrast. Multiplanar reconstructed images including MIPs were obtained and reviewed to evaluate the vascular anatomy.  Contrast: OMNIPAQUE IOHEXOL 350 MG/ML SOLN  Comparison: 04/16/2011  Findings: No evidence for pulmonary embolism.  Images of the upper abdomen are unremarkable.  There is no significant pericardial or pleural fluid.  There is no significant chest lymphadenopathy.  The trachea and mainstem bronchi are patent.  Stable 4 mm peripheral nodule in the right upper lobe on sequence 5, image 30. Stable pleural-based density in the right lower lobe on 58.  No focal airspace disease or consolidation.  No acute bony abnormality.  IMPRESSION: Negative for pulmonary embolism.  No acute chest abnormalities.  Two stable small nodules in the right lung.  These are likely benign based on the stability.   Original Report Authenticated By: Richarda Overlie, M.D.    EKG:  Date: 03/27/2013 Normal sinus rhythm with PACs.  Rate 91.  PR 138, QRS 78, QTc 442.  QRS axis 35.  No LVH.  T wave flattening in aVF, V4-V6, non-specific.    1. Palpitations       MDM   52 yo F with h/o PE remotely presents with mild palpitations. She has normal vitals save for mild HTN.  She is well appearing and in NAD.  Cardiopulmonary exam unremarkable.   Troponin negative X 1  sent from triage.  Doubt ischemic in nature.   I think this is highly atypical for PE, but she is not low risk; therefore, CTA PE performed and negative. Monitored for several hours in the ED while awaiting workup; she had ongoing symptoms but not changes on her monitor were noted in correlation; doubt significant arrhythmia.  Discussed with patient outpatient monitoring options that may be considered by her PCP if symptoms ongoing.  She is stable for discharge with PCP followup this week.  Gave return precautions.        Toney Sang, MD 03/28/13 1058

## 2013-03-27 NOTE — ED Notes (Signed)
IV team paged x2 for IV access

## 2013-03-27 NOTE — ED Notes (Signed)
Unable to gain IV access.

## 2013-03-27 NOTE — ED Notes (Signed)
Pt returned from CT °

## 2013-03-27 NOTE — ED Notes (Signed)
States since she woke this am she can feel her heart "beating funny, it races then it slows down then it does it again." denies pain. Reports sob and nausea. A&Ox4, breathing easily

## 2013-03-29 NOTE — ED Provider Notes (Signed)
I saw and evaluated the patient, reviewed the resident's note and I agree with the findings and plan. I have reviewed EKG and agree with the resident interpretation.  you Pt with c/o of palpatations but denies CP or SOB.  They have been occuring all day.  PAC on EKG but o/w wnl.  No findings on exam.  Pt with hx of PE/DVT and CTA today wnl.  Discussed f/u with cards and pcp.  Gwyneth Sprout, MD 03/29/13 [phone]

## 2013-05-02 ENCOUNTER — Encounter (HOSPITAL_COMMUNITY): Payer: Self-pay | Admitting: *Deleted

## 2013-05-02 ENCOUNTER — Emergency Department (HOSPITAL_COMMUNITY)
Admission: EM | Admit: 2013-05-02 | Discharge: 2013-05-02 | Disposition: A | Discharge status: Home / Self Care | Payer: Medicaid Other | Attending: Emergency Medicine | Admitting: Emergency Medicine

## 2013-05-02 DIAGNOSIS — J449 Chronic obstructive pulmonary disease, unspecified: Secondary | ICD-10-CM | POA: Insufficient documentation

## 2013-05-02 DIAGNOSIS — B86 Scabies: Secondary | ICD-10-CM | POA: Insufficient documentation

## 2013-05-02 DIAGNOSIS — R011 Cardiac murmur, unspecified: Secondary | ICD-10-CM | POA: Insufficient documentation

## 2013-05-02 DIAGNOSIS — F3289 Other specified depressive episodes: Secondary | ICD-10-CM | POA: Insufficient documentation

## 2013-05-02 DIAGNOSIS — I10 Essential (primary) hypertension: Secondary | ICD-10-CM | POA: Insufficient documentation

## 2013-05-02 DIAGNOSIS — Z79899 Other long term (current) drug therapy: Secondary | ICD-10-CM | POA: Insufficient documentation

## 2013-05-02 DIAGNOSIS — L299 Pruritus, unspecified: Secondary | ICD-10-CM | POA: Insufficient documentation

## 2013-05-02 DIAGNOSIS — Z7901 Long term (current) use of anticoagulants: Secondary | ICD-10-CM | POA: Insufficient documentation

## 2013-05-02 DIAGNOSIS — F329 Major depressive disorder, single episode, unspecified: Secondary | ICD-10-CM | POA: Insufficient documentation

## 2013-05-02 DIAGNOSIS — M129 Arthropathy, unspecified: Secondary | ICD-10-CM | POA: Insufficient documentation

## 2013-05-02 DIAGNOSIS — J4489 Other specified chronic obstructive pulmonary disease: Secondary | ICD-10-CM | POA: Insufficient documentation

## 2013-05-02 MED ORDER — PERMETHRIN 5 % EX CREA: TOPICAL_CREAM | CUTANEOUS | Status: DC

## 2013-05-02 NOTE — ED Provider Notes (Signed)
History     CSN: [id number]  Arrival date & time 05/02/13  1133   First MD Initiated Contact with Patient 05/02/13 1148      Chief Complaint  Patient presents with  . Rash    (Consider location/radiation/quality/duration/timing/severity/associated sxs/prior treatment) HPI Comments: Patient presents to the ED for rash. Patient reports her daughter and child were recently seen at the pediatricians office and diagnosed with scabies. They have received treatment with permethrin cream. States she has already washed all sheets, linens, towels, and clothing in hot water. She states she has noticed small bites on her upper extremities, hands, and trunk which are pruritic.  Has not taken any meds for her sx.  No recent fevers, sweats, or chills.  No change in soaps, detergents, or personal care products.  The history is provided by the patient.    Past Medical History  Diagnosis Date  . Hypertension   . Depression   . Arthritis   . COPD (chronic obstructive pulmonary disease)   . Asthma   . Heart murmur     Past Surgical History  Procedure Laterality Date  . Hernia repair    . Cesarean section    . Joint replacement      right TKA    History reviewed. No pertinent family history.  History  Substance Use Topics  . Smoking status: Never Smoker   . Smokeless tobacco: Not on file  . Alcohol Use: No    OB History   Grav Para Term Preterm Abortions TAB SAB Ect Mult Living                  Review of Systems  Skin: Positive for rash.  All other systems reviewed and are negative.    Allergies  Penicillins  Home Medications   Current Outpatient Rx  Name  Route  Sig  Dispense  Refill  . albuterol (PROVENTIL HFA;VENTOLIN HFA) 108 (90 BASE) MCG/ACT inhaler   Inhalation   Inhale 2 puffs into the lungs every 6 (six) hours as needed. For shortness of breath and wheezing         . budesonide-formoterol (SYMBICORT) 160-4.5 MCG/ACT inhaler   Inhalation   Inhale 2 puffs  into the lungs 2 (two) times daily.         . carisoprodol (SOMA) 350 MG tablet   Oral   Take 350 mg by mouth 2 (two) times daily.         . cetirizine (ZYRTEC) 10 MG tablet   Oral   Take 10 mg by mouth at bedtime.          . cyclobenzaprine (FLEXERIL) 10 MG tablet   Oral   Take 10 mg by mouth 2 (two) times daily with a meal.         . diclofenac sodium (VOLTAREN) 1 % GEL   Topical   Apply 2 g topically daily as needed (muscle pain).          . DULoxetine (CYMBALTA) 60 MG capsule   Oral   Take 60 mg by mouth at bedtime.          . phenytoin (DILANTIN) 100 MG ER capsule   Oral   Take 200 mg by mouth at bedtime.          . simvastatin (ZOCOR) 20 MG tablet   Oral   Take 20 mg by mouth every evening.         . traMADol (ULTRAM) 50 MG tablet  Oral   Take 100 mg by mouth every 8 (eight) hours as needed for pain.         . traZODone (DESYREL) 100 MG tablet   Oral   Take 100 mg by mouth at bedtime.          . verapamil (VERELAN PM) 240 MG 24 hr capsule   Oral   Take 240 mg by mouth at bedtime.          Marland Kitchen warfarin (COUMADIN) 10 MG tablet   Oral   Take 10 mg by mouth at bedtime.          Marland Kitchen warfarin (COUMADIN) 2.5 MG tablet   Oral   Take 2.5 mg by mouth at bedtime.           BP 131/86  Pulse 80  Temp(Src) 97.1 F (36.2 C) (Oral)  Resp 18  SpO2 97%  Physical Exam  Nursing note and vitals reviewed. Constitutional: She is oriented to person, place, and time. She appears well-developed and well-nourished.  HENT:  Head: Normocephalic and atraumatic.  Eyes: Conjunctivae and EOM are normal. Pupils are equal, round, and reactive to light.  Neck: Normal range of motion. Neck supple.  Cardiovascular: Normal rate, regular rhythm and normal heart sounds.   Pulmonary/Chest: Effort normal and breath sounds normal.  Musculoskeletal: Normal range of motion. She exhibits no edema.  Neurological: She is alert and oriented to person, place, and time.   Skin: Skin is warm and dry. Rash noted.  Diffuse, small pruritic bites with burrows covering trunk, BUE, and hands including webs of fingers  Psychiatric: She has a normal mood and affect.    ED Course  Procedures (including critical care time)  Labs Reviewed - No data to display No results found.   1. Scabies       MDM   Bites with marks consistent with scabies. Rx permethrin cream.  Instructed to take over-the-counter Benadryl as needed for itching.  Follow with primary care physician if symptoms not improving.  Discussed plan with patient, she agreed. Return precautions advised.      Garlon Hatchet, PA-C 05/02/13 1327

## 2013-05-02 NOTE — ED Notes (Signed)
Patient has no visible rash, but claims she is itching.  Patient states that her daughter and grandaughter have scabies and they live with her.

## 2013-05-02 NOTE — ED Notes (Signed)
Pt reports family members recently diagnosed with scabies and now pt having rash and itching.

## 2013-05-03 NOTE — ED Provider Notes (Signed)
Medical screening examination/treatment/procedure(s) were performed by non-physician practitioner and as supervising physician I was immediately available for consultation/collaboration.   Joya Gaskins, MD 05/03/13 [phone]

## 2013-06-20 ENCOUNTER — Other Ambulatory Visit: Payer: Self-pay | Admitting: Family Medicine

## 2013-06-20 DIAGNOSIS — Z1231 Encounter for screening mammogram for malignant neoplasm of breast: Secondary | ICD-10-CM

## 2013-07-11 ENCOUNTER — Emergency Department (HOSPITAL_COMMUNITY): Payer: Medicaid Other

## 2013-07-11 ENCOUNTER — Emergency Department (HOSPITAL_COMMUNITY)
Admission: EM | Admit: 2013-07-11 | Discharge: 2013-07-11 | Disposition: A | Discharge status: Home / Self Care | Payer: Medicaid Other | Attending: Emergency Medicine | Admitting: Emergency Medicine

## 2013-07-11 ENCOUNTER — Encounter (HOSPITAL_COMMUNITY): Payer: Self-pay | Admitting: Emergency Medicine

## 2013-07-11 DIAGNOSIS — F3289 Other specified depressive episodes: Secondary | ICD-10-CM | POA: Insufficient documentation

## 2013-07-11 DIAGNOSIS — F329 Major depressive disorder, single episode, unspecified: Secondary | ICD-10-CM | POA: Insufficient documentation

## 2013-07-11 DIAGNOSIS — J4489 Other specified chronic obstructive pulmonary disease: Secondary | ICD-10-CM | POA: Insufficient documentation

## 2013-07-11 DIAGNOSIS — IMO0002 Reserved for concepts with insufficient information to code with codable children: Secondary | ICD-10-CM | POA: Insufficient documentation

## 2013-07-11 DIAGNOSIS — G8929 Other chronic pain: Secondary | ICD-10-CM | POA: Insufficient documentation

## 2013-07-11 DIAGNOSIS — M25569 Pain in unspecified knee: Secondary | ICD-10-CM | POA: Insufficient documentation

## 2013-07-11 DIAGNOSIS — Z7901 Long term (current) use of anticoagulants: Secondary | ICD-10-CM | POA: Insufficient documentation

## 2013-07-11 DIAGNOSIS — J449 Chronic obstructive pulmonary disease, unspecified: Secondary | ICD-10-CM | POA: Insufficient documentation

## 2013-07-11 DIAGNOSIS — Y93G9 Activity, other involving cooking and grilling: Secondary | ICD-10-CM | POA: Insufficient documentation

## 2013-07-11 DIAGNOSIS — I10 Essential (primary) hypertension: Secondary | ICD-10-CM | POA: Insufficient documentation

## 2013-07-11 DIAGNOSIS — M25519 Pain in unspecified shoulder: Secondary | ICD-10-CM | POA: Insufficient documentation

## 2013-07-11 DIAGNOSIS — W010XXA Fall on same level from slipping, tripping and stumbling without subsequent striking against object, initial encounter: Secondary | ICD-10-CM | POA: Insufficient documentation

## 2013-07-11 DIAGNOSIS — W19XXXA Unspecified fall, initial encounter: Secondary | ICD-10-CM

## 2013-07-11 DIAGNOSIS — Z96659 Presence of unspecified artificial knee joint: Secondary | ICD-10-CM | POA: Insufficient documentation

## 2013-07-11 DIAGNOSIS — R011 Cardiac murmur, unspecified: Secondary | ICD-10-CM | POA: Insufficient documentation

## 2013-07-11 DIAGNOSIS — M2550 Pain in unspecified joint: Secondary | ICD-10-CM

## 2013-07-11 DIAGNOSIS — M129 Arthropathy, unspecified: Secondary | ICD-10-CM | POA: Insufficient documentation

## 2013-07-11 DIAGNOSIS — Y9289 Other specified places as the place of occurrence of the external cause: Secondary | ICD-10-CM | POA: Insufficient documentation

## 2013-07-11 DIAGNOSIS — Z79899 Other long term (current) drug therapy: Secondary | ICD-10-CM | POA: Insufficient documentation

## 2013-07-11 DIAGNOSIS — S79919A Unspecified injury of unspecified hip, initial encounter: Secondary | ICD-10-CM | POA: Insufficient documentation

## 2013-07-11 MED ORDER — OXYCODONE-ACETAMINOPHEN 5-325 MG PO TABS
2.0000 | ORAL_TABLET | Freq: Once | ORAL | Status: AC
  Administered 2013-07-11: 2 via ORAL
  Filled 2013-07-11: qty 2

## 2013-07-11 MED ORDER — HYDROCODONE-ACETAMINOPHEN 5-325 MG PO TABS: 1.0000 | ORAL_TABLET | ORAL | Status: DC | PRN

## 2013-07-11 MED ORDER — DIAZEPAM 5 MG PO TABS: 5.0000 mg | ORAL_TABLET | Freq: Four times a day (QID) | ORAL | Status: DC | PRN

## 2013-07-11 MED ORDER — DIAZEPAM 5 MG PO TABS
10.0000 mg | ORAL_TABLET | Freq: Once | ORAL | Status: AC
  Administered 2013-07-11: 10 mg via ORAL
  Filled 2013-07-11: qty 2

## 2013-07-11 NOTE — ED Notes (Signed)
Pt states she slipped on grease in the kitchen floor while frying chicken approx 45 min ago.  C/o lower back pain and R leg pain.  Denies neck pain.  Denies LOC.

## 2013-07-11 NOTE — ED Notes (Signed)
Pt's leg elevated and ice applied to pt's knee.

## 2013-07-11 NOTE — ED Provider Notes (Signed)
CSN: [id number]     Arrival date & time 07/11/13  1950 History  This chart was scribed for Dierdre Forth, PA working with Lyanne Co, MD by Quintella Reichert, ED Scribe. This patient was seen in room TR07C/TR07C and the patient's care was started at 8:45 PM.      Chief Complaint  Patient presents with  . Fall    The history is provided by the patient. No language interpreter was used.    HPI Comments: Amanda Christensen is a 52 y.o. female with h/o arthritis and HTN who presents to the Emergency Department complaining of a fall that occurred 1 hour pta with subsequent constant, moderate pain to the left shoulder, right lower back, right knee, and right hip.  Pt reports that she slipped on a greasy kitchen floor while cooking and fell with impact to her left shoulder and right lower back.  She denies head impact or LOC.  Pt's right knee pain and left shoulder pain are chronic but she states they were exacerbated by the fall.  She denies numbness or tingling in legs.  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  She is on Coumadin.    Past Medical History  Diagnosis Date  . Hypertension   . Depression   . Arthritis   . COPD (chronic obstructive pulmonary disease)   . Asthma   . Heart murmur     Past Surgical History  Procedure Laterality Date  . Hernia repair    . Cesarean section    . Joint replacement      right TKA    No family history on file.   History  Substance Use Topics  . Smoking status: Never Smoker   . Smokeless tobacco: Not on file  . Alcohol Use: No    OB History   Grav Para Term Preterm Abortions TAB SAB Ect Mult Living                   Review of Systems  Constitutional: Negative for fever, diaphoresis, appetite change, fatigue and unexpected weight change.  HENT: Negative for mouth sores, neck pain and neck stiffness.   Eyes: Negative for visual disturbance.  Respiratory: Negative  for cough, chest tightness, shortness of breath and wheezing.   Cardiovascular: Negative for chest pain.  Gastrointestinal: Negative for nausea, vomiting, abdominal pain, diarrhea and constipation.  Endocrine: Negative for polydipsia, polyphagia and polyuria.  Genitourinary: Negative for dysuria, urgency, frequency and hematuria.  Musculoskeletal: Positive for back pain and arthralgias.  Skin: Negative for rash.  Allergic/Immunologic: Negative for immunocompromised state.  Neurological: Negative for syncope, light-headedness and headaches.  Hematological: Does not bruise/bleed easily.  Psychiatric/Behavioral: Negative for sleep disturbance. The patient is not nervous/anxious.       Allergies  Penicillins  Home Medications   Current Outpatient Rx  Name  Route  Sig  Dispense  Refill  . albuterol (PROVENTIL HFA;VENTOLIN HFA) 108 (90 BASE) MCG/ACT inhaler   Inhalation   Inhale 2 puffs into the lungs every 6 (six) hours as needed for wheezing or shortness of breath.          . budesonide-formoterol (SYMBICORT) 160-4.5 MCG/ACT inhaler   Inhalation   Inhale 2 puffs into the lungs 2 (two) times daily.         . cetirizine (ZYRTEC) 10 MG tablet   Oral   Take 10 mg by mouth at bedtime.          Marland Kitchen  diclofenac sodium (VOLTAREN) 1 % GEL   Topical   Apply 2 g topically daily as needed (muscle pain).          . DULoxetine (CYMBALTA) 30 MG capsule   Oral   Take 30 mg by mouth daily.         . phenytoin (DILANTIN) 100 MG ER capsule   Oral   Take 200 mg by mouth at bedtime.          . salsalate (DISALCID) 750 MG tablet   Oral   Take 750 mg by mouth 3 (three) times daily.          . simvastatin (ZOCOR) 20 MG tablet   Oral   Take 20 mg by mouth every evening.         . traMADol (ULTRAM) 50 MG tablet   Oral   Take 100 mg by mouth every 8 (eight) hours as needed for pain.         . traZODone (DESYREL) 100 MG tablet   Oral   Take 100 mg by mouth at bedtime.           . verapamil (VERELAN PM) 240 MG 24 hr capsule   Oral   Take 240 mg by mouth at bedtime.          Marland Kitchen warfarin (COUMADIN) 10 MG tablet   Oral   Take 10 mg by mouth See admin instructions. Take 10mg  one day, then take 12.5mg  (one 10mg  tablet and one 2.5mg  tablet) the next day, and continue alternating accordingly         . warfarin (COUMADIN) 2.5 MG tablet   Oral   Take 2.5 mg by mouth every other day. Taken with 10mg  tablet for total dose of 12.5mg  every other day         . diazepam (VALIUM) 5 MG tablet   Oral   Take 1 tablet (5 mg total) by mouth every 6 (six) hours as needed for anxiety (spasms).   9 tablet   0   . HYDROcodone-acetaminophen (NORCO/VICODIN) 5-325 MG per tablet   Oral   Take 1-2 tablets by mouth every 4 (four) hours as needed for pain.   9 tablet   0    BP 148/74  Pulse 74  Temp(Src) 98.4 F (36.9 C) (Oral)  Resp 18  SpO2 99%  Physical Exam  Nursing note and vitals reviewed. Constitutional: She appears well-developed and well-nourished. No distress.  HENT:  Head: Normocephalic and atraumatic.  Mouth/Throat: Oropharynx is clear and moist. No oropharyngeal exudate.  Eyes: Conjunctivae are normal.  Neck: Normal range of motion. Neck supple.  Full ROM without pain  Cardiovascular: Normal rate, regular rhythm and intact distal pulses.   Pulmonary/Chest: Effort normal and breath sounds normal. No respiratory distress. She has no wheezes.  Abdominal: Soft. She exhibits no distension. There is no tenderness.  Musculoskeletal:  Full range of motion of the T-spine and L-spine No tenderness to palpation of the spinous processes of the T-spine or L-spine Tenderness to palpation of the right paraspinous muscles of the L-spine Well-healed scar over the right knee No ROM of right knee (baseline) 90 degrees of flexion and extension of left shoulder (baseline) No palpable deformities No ecchymosis  Lymphadenopathy:    She has no cervical adenopathy.   Neurological: She is alert. She has normal reflexes.  Speech is clear and goal oriented, follows commands Normal strength in upper and lower extremities bilaterally including dorsiflexion and plantar flexion, strong and  equal grip strength Sensation normal to light and sharp touch Moves extremities without ataxia, coordination intact Normal balance Ambulates to baseline in the department  Skin: Skin is warm and dry. No rash noted. She is not diaphoretic. No erythema.    ED Course  Procedures (including critical care time)  DIAGNOSTIC STUDIES: Oxygen Saturation is 99% on room air, normal by my interpretation.    COORDINATION OF CARE: 8:50 PM-Discussed treatment plan which includes pain medication and x-rays with pt at bedside and pt agreed to plan.    Labs Reviewed - No data to display Dg Hip Complete Right  07/11/2013   *RADIOLOGY REPORT*  Clinical Data: Back pain and leg pain after fall today.  Right hip and knee pain.  Left shoulder pain.  RIGHT HIP - COMPLETE 2+ VIEW  Comparison: None.  Findings: Mild degenerative changes in the superior acetabular roof bilaterally.  No evidence of acute fracture or dislocation of the pelvis or right hip.  No focal bone lesion or bone destruction. Bone cortex and trabecular architecture appear intact.  The SI joints, symphysis pubis, and pelvic rim are not displaced. Multiple pelvic calcifications likely representing phleboliths.  IMPRESSION: No displaced fractures demonstrated in the pelvis or right hip.   Original Report Authenticated By: Burman Nieves, M.D.   Dg Shoulder Left  07/11/2013   *RADIOLOGY REPORT*  Clinical Data: Left shoulder pain after fall today.  LEFT SHOULDER - 2+ VIEW  Comparison: 01/31/2007  Findings: There is marked degenerative change in the left shoulder with narrowing of the glenohumeral joint and prominent bone deformity and productive bone involving the articular surface of the humeral head.  This demonstrates progression  since the previous study.  There is some loss of sub acromial space which may suggest chronic rotator cuff arthropathy.  No acute fracture or subluxation is demonstrated.  No focal bone lesion or bone destruction.  IMPRESSION: Prominent degenerative changes in the glenohumeral joint with bony remodelling.  This is progressive since previous study.  No acute bony abnormalities.   Original Report Authenticated By: Burman Nieves, M.D.   Dg Knee Complete 4 Views Right  07/11/2013   *RADIOLOGY REPORT*  Clinical Data: Right knee pain after fall today.  RIGHT KNEE - COMPLETE 4+ VIEW  Comparison: 02/10/2009  Findings: Since the previous study, there has been interval right total knee arthroplasty.  The components appear well seated.  There is no evidence of acute fracture or subluxation of the right knee. No focal bone lesion or bone destruction.  Bone cortex and trabecular architecture appear intact.  No significant effusion.  IMPRESSION: Right total knee arthroplasty.  Components appear well seated.  No acute bony abnormalities.   Original Report Authenticated By: Burman Nieves, M.D.   1. Fall at home, initial encounter   2. Arthralgia     MDM  Amanda Christensen presents with arthralgias and low back pain after a fall.  Patient X-Ray negative for obvious fracture or dislocation. I personally reviewed the imaging tests through PACS system.  I reviewed available ER/hospitalization records through the EMR.  Pain managed in ED. Pt advised to follow up with orthopedics if symptoms persist for possibility of missed fracture diagnosis. Patient also with back pain.  No neurological deficits and normal neuro exam.  Patient can walk to baseline but states is painful.  No loss of bowel or bladder control.  No concern for cauda equina.  No fever, night sweats, weight loss, h/o cancer, IVDU.   Conservative therapy recommended and discussed. Patient  will be dc home & is agreeable with above plan.  I have also discussed  reasons to return immediately to the ER.  Patient expresses understanding and agrees with plan.  I personally performed the services described in this documentation, which was scribed in my presence. The recorded information has been reviewed and is accurate.   Dahlia Client Perry Molla, PA-C 07/12/13 [phone]

## 2013-07-12 NOTE — ED Provider Notes (Signed)
Medical screening examination/treatment/procedure(s) were performed by non-physician practitioner and as supervising physician I was immediately available for consultation/collaboration.  Donzel Romack M [place], MD 07/12/13 2241 

## 2013-07-22 ENCOUNTER — Ambulatory Visit: Payer: Medicaid Other

## 2013-09-02 ENCOUNTER — Ambulatory Visit: Payer: Medicaid Other

## 2013-10-18 ENCOUNTER — Emergency Department (HOSPITAL_COMMUNITY): Payer: Medicaid Other

## 2013-10-18 ENCOUNTER — Encounter (HOSPITAL_COMMUNITY): Payer: Self-pay | Admitting: Emergency Medicine

## 2013-10-18 ENCOUNTER — Emergency Department (HOSPITAL_COMMUNITY)
Admission: EM | Admit: 2013-10-18 | Discharge: 2013-10-18 | Disposition: A | Discharge status: Home / Self Care | Payer: Medicaid Other | Attending: Emergency Medicine | Admitting: Emergency Medicine

## 2013-10-18 DIAGNOSIS — Z880 Allergy status to penicillin: Secondary | ICD-10-CM | POA: Insufficient documentation

## 2013-10-18 DIAGNOSIS — M549 Dorsalgia, unspecified: Secondary | ICD-10-CM | POA: Insufficient documentation

## 2013-10-18 DIAGNOSIS — IMO0002 Reserved for concepts with insufficient information to code with codable children: Secondary | ICD-10-CM | POA: Insufficient documentation

## 2013-10-18 DIAGNOSIS — F3289 Other specified depressive episodes: Secondary | ICD-10-CM | POA: Insufficient documentation

## 2013-10-18 DIAGNOSIS — M25562 Pain in left knee: Secondary | ICD-10-CM

## 2013-10-18 DIAGNOSIS — Y939 Activity, unspecified: Secondary | ICD-10-CM | POA: Insufficient documentation

## 2013-10-18 DIAGNOSIS — M1710 Unilateral primary osteoarthritis, unspecified knee: Secondary | ICD-10-CM | POA: Insufficient documentation

## 2013-10-18 DIAGNOSIS — R011 Cardiac murmur, unspecified: Secondary | ICD-10-CM | POA: Insufficient documentation

## 2013-10-18 DIAGNOSIS — I10 Essential (primary) hypertension: Secondary | ICD-10-CM | POA: Insufficient documentation

## 2013-10-18 DIAGNOSIS — J449 Chronic obstructive pulmonary disease, unspecified: Secondary | ICD-10-CM | POA: Insufficient documentation

## 2013-10-18 DIAGNOSIS — M1712 Unilateral primary osteoarthritis, left knee: Secondary | ICD-10-CM

## 2013-10-18 DIAGNOSIS — Y929 Unspecified place or not applicable: Secondary | ICD-10-CM | POA: Insufficient documentation

## 2013-10-18 DIAGNOSIS — F329 Major depressive disorder, single episode, unspecified: Secondary | ICD-10-CM | POA: Insufficient documentation

## 2013-10-18 DIAGNOSIS — X500XXA Overexertion from strenuous movement or load, initial encounter: Secondary | ICD-10-CM | POA: Insufficient documentation

## 2013-10-18 DIAGNOSIS — J4489 Other specified chronic obstructive pulmonary disease: Secondary | ICD-10-CM | POA: Insufficient documentation

## 2013-10-18 DIAGNOSIS — Z79899 Other long term (current) drug therapy: Secondary | ICD-10-CM | POA: Insufficient documentation

## 2013-10-18 DIAGNOSIS — Z7901 Long term (current) use of anticoagulants: Secondary | ICD-10-CM | POA: Insufficient documentation

## 2013-10-18 DIAGNOSIS — S8990XA Unspecified injury of unspecified lower leg, initial encounter: Secondary | ICD-10-CM | POA: Insufficient documentation

## 2013-10-18 MED ORDER — HYDROCODONE-ACETAMINOPHEN 5-325 MG PO TABS
1.0000 | ORAL_TABLET | Freq: Once | ORAL | Status: AC
  Administered 2013-10-18: 1 via ORAL
  Filled 2013-10-18: qty 1

## 2013-10-18 MED ORDER — HYDROCODONE-ACETAMINOPHEN 5-325 MG PO TABS: 1.0000 | ORAL_TABLET | ORAL | Status: DC | PRN

## 2013-10-18 NOTE — ED Notes (Signed)
PT reports Lt knee gave out on her last night. Pt also reports when she stood up the LT knee made a pop sound. Since the the pain and swelling have increased.

## 2013-10-18 NOTE — ED Notes (Signed)
Patient transported to X-ray 

## 2013-10-18 NOTE — ED Notes (Signed)
Pt discharged home with all belongings, alert and ambulatory in room from stretcher to wheelchair, slight limp noted when ambulating, 1 new RX prescribed, pt instructed to f/u with Orthopedic pt verbalizes understanding of discharge instructions, pt driven home by spouse

## 2013-10-18 NOTE — ED Provider Notes (Signed)
CSN: [id number]     Arrival date & time 10/18/13  7829 History   First MD Initiated Contact with Patient 10/18/13 0745     Chief Complaint  Patient presents with  . Knee Pain   (Consider location/radiation/quality/duration/timing/severity/associated sxs/prior Treatment) HPI Patient with hx arthritis in multiple joints and chronic knee problems presents with increased left knee pain after her left knee "gave out" on her last night and she struggled to catch herself and pull herself back up.  States her left knee popped when she was getting back up and she has had constant throbbing pain every since.  Has had some tingling in her lower leg and foot.  Has taken her home pain medication (diclophenac sodium?) without improvement. Has an orthopedist who has done a right total knee replacement on her and is seeing her for other joint problems, unsure of his name.  Denies fevers, chills, recent illness, any other injury to the knee, any recent lacerations/breaks in skin, STD symptoms or treatment.  Is on coumadin for hx DVT.   Past Medical History  Diagnosis Date  . Hypertension   . Depression   . Arthritis   . COPD (chronic obstructive pulmonary disease)   . Asthma   . Heart murmur    Past Surgical History  Procedure Laterality Date  . Hernia repair    . Cesarean section    . Joint replacement      right TKA   No family history on file. History  Substance Use Topics  . Smoking status: Never Smoker   . Smokeless tobacco: Not on file  . Alcohol Use: No   OB History   Grav Para Term Preterm Abortions TAB SAB Ect Mult Living                 Review of Systems  Constitutional: Negative for fever and chills.  Cardiovascular: Negative for leg swelling.  Musculoskeletal: Positive for arthralgias and back pain. Negative for myalgias.  Skin: Negative for color change.  Hematological: Bruises/bleeds easily.    Allergies  Penicillins  Home Medications   Current Outpatient Rx  Name   Route  Sig  Dispense  Refill  . albuterol (PROVENTIL HFA;VENTOLIN HFA) 108 (90 BASE) MCG/ACT inhaler   Inhalation   Inhale 2 puffs into the lungs every 6 (six) hours as needed for wheezing or shortness of breath.          . budesonide-formoterol (SYMBICORT) 160-4.5 MCG/ACT inhaler   Inhalation   Inhale 2 puffs into the lungs 2 (two) times daily.         . cetirizine (ZYRTEC) 10 MG tablet   Oral   Take 10 mg by mouth at bedtime.          . diazepam (VALIUM) 5 MG tablet   Oral   Take 1 tablet (5 mg total) by mouth every 6 (six) hours as needed for anxiety (spasms).   9 tablet   0   . diclofenac sodium (VOLTAREN) 1 % GEL   Topical   Apply 2 g topically daily as needed (muscle pain).          . DULoxetine (CYMBALTA) 30 MG capsule   Oral   Take 30 mg by mouth daily.         Marland Kitchen HYDROcodone-acetaminophen (NORCO/VICODIN) 5-325 MG per tablet   Oral   Take 1-2 tablets by mouth every 4 (four) hours as needed for pain.   9 tablet   0   . phenytoin (  DILANTIN) 100 MG ER capsule   Oral   Take 200 mg by mouth at bedtime.          . salsalate (DISALCID) 750 MG tablet   Oral   Take 750 mg by mouth 3 (three) times daily.          . simvastatin (ZOCOR) 20 MG tablet   Oral   Take 20 mg by mouth every evening.         . traMADol (ULTRAM) 50 MG tablet   Oral   Take 100 mg by mouth every 8 (eight) hours as needed for pain.         . traZODone (DESYREL) 100 MG tablet   Oral   Take 100 mg by mouth at bedtime.          . verapamil (VERELAN PM) 240 MG 24 hr capsule   Oral   Take 240 mg by mouth at bedtime.          Marland Kitchen warfarin (COUMADIN) 10 MG tablet   Oral   Take 10 mg by mouth See admin instructions. Take 10mg  one day, then take 12.5mg  (one 10mg  tablet and one 2.5mg  tablet) the next day, and continue alternating accordingly         . warfarin (COUMADIN) 2.5 MG tablet   Oral   Take 2.5 mg by mouth every other day. Taken with 10mg  tablet for total dose of  12.5mg  every other day          BP 132/94  Pulse 94  Temp(Src) 98.3 F (36.8 C) (Oral)  Resp 18  SpO2 100% Physical Exam  Nursing note and vitals reviewed. Constitutional: She appears well-developed and well-nourished. No distress.  HENT:  Head: Normocephalic and atraumatic.  Neck: Neck supple.  Pulmonary/Chest: Effort normal.  Musculoskeletal:       Left knee: She exhibits normal range of motion, no ecchymosis, no deformity, normal alignment, no LCL laxity and no MCL laxity. Tenderness found. Medial joint line tenderness noted.       Left ankle: Normal.       Left lower leg: She exhibits no tenderness, no bony tenderness, no swelling and no edema.       Legs:      Left foot: Normal.  Spine nontender, no crepitus, or stepoffs.   Exam somewhat limited due to patient body habitus.  Distal pulses, sensation intact.    Neurological: She is alert.  Skin: She is not diaphoretic.    ED Course  Procedures (including critical care time) Labs Review Labs Reviewed - No data to display Imaging Review Dg Knee Complete 4 Views Left  10/18/2013   CLINICAL DATA:  Pain post fall  EXAM: LEFT KNEE - COMPLETE 4+ VIEW  COMPARISON:  None.  FINDINGS: Narrowing of the articular cartilage in the medial compartment. Small marginal spurs about all 3 compartments of the knee. No fracture, dislocation, or effusion. Normal mineralization and alignment.  IMPRESSION: 1. Negative for fracture or other acute bone abnormality. 2. Tricompartmental degenerative changes, most marked medially.   Electronically Signed   By: Oley Balm M.D.   On: 10/18/2013 08:53    EKG Interpretation   None      Filed Vitals:   10/18/13 0755  BP: 132/94  Pulse: 94  Temp: 98.3 F (36.8 C)  Resp: 18     MDM   1. Left knee pain   2. Degenerative joint disease of knee, left      Pt with chronic  arthritis in multiple joints with increased left knee pain after it "gave out" temporarily last night and popped as  patient was attempting to right herself.  There is no erythema, edema, warmth of the joint.  Pt is afebrile, nontoxic.  No systemic symptoms.  "giving out" likely due to chronic pain /degenerative changes/arthritis.  Possible injury to [facility] though the joint is stable.  ACE wrap to provide compression.  Recommended RICE for home.  D/C with norco and orthopedic follow up.  Advised to see her own orthopedist, referral to on call is backup in case she is unable to find name of her current doctor.  Discussed result, findings, treatment, and follow up  with patient.  Pt given return precautions.  Pt verbalizes understanding and agrees with plan.      I doubt any other EMC precluding discharge at this time including, but not necessarily limited to the following: septic joint, gout, unstable ligamentous injury.     [place], PA-C 10/18/13 [phone]

## 2013-10-22 NOTE — ED Provider Notes (Signed)
Medical screening examination/treatment/procedure(s) were performed by non-physician practitioner and as supervising physician I was immediately available for consultation/collaboration.  EKG Interpretation   None        Raeford Razor, MD 10/22/13 [phone]

## 2014-01-21 ENCOUNTER — Other Ambulatory Visit: Payer: Self-pay | Admitting: Family Medicine

## 2014-01-27 ENCOUNTER — Other Ambulatory Visit: Payer: Self-pay | Admitting: Family Medicine

## 2014-03-05 ENCOUNTER — Other Ambulatory Visit: Payer: Self-pay | Admitting: Nurse Practitioner

## 2014-03-05 DIAGNOSIS — Z1231 Encounter for screening mammogram for malignant neoplasm of breast: Secondary | ICD-10-CM

## 2014-03-12 ENCOUNTER — Encounter (INDEPENDENT_AMBULATORY_CARE_PROVIDER_SITE_OTHER): Payer: Self-pay

## 2014-03-12 ENCOUNTER — Ambulatory Visit
Admission: RE | Admit: 2014-03-12 | Discharge: 2014-03-12 | Disposition: A | Discharge status: Home / Self Care | Payer: Medicaid Other | Source: Ambulatory Visit | Attending: Nurse Practitioner | Admitting: Nurse Practitioner

## 2014-03-12 DIAGNOSIS — Z1231 Encounter for screening mammogram for malignant neoplasm of breast: Secondary | ICD-10-CM

## 2014-03-25 ENCOUNTER — Emergency Department (HOSPITAL_COMMUNITY): Payer: Medicaid Other

## 2014-03-25 ENCOUNTER — Encounter (HOSPITAL_COMMUNITY): Payer: Self-pay | Admitting: Emergency Medicine

## 2014-03-25 ENCOUNTER — Inpatient Hospital Stay (HOSPITAL_COMMUNITY)
Admission: EM | Admit: 2014-03-25 | Discharge: 2014-03-27 | DRG: 313 | Disposition: A | Discharge status: Home / Self Care | Payer: Medicaid Other | Attending: Internal Medicine | Admitting: Internal Medicine

## 2014-03-25 DIAGNOSIS — R079 Chest pain, unspecified: Secondary | ICD-10-CM | POA: Diagnosis present

## 2014-03-25 DIAGNOSIS — Z6841 Body Mass Index (BMI) 40.0 and over, adult: Secondary | ICD-10-CM

## 2014-03-25 DIAGNOSIS — Z96659 Presence of unspecified artificial knee joint: Secondary | ICD-10-CM

## 2014-03-25 DIAGNOSIS — Z7901 Long term (current) use of anticoagulants: Secondary | ICD-10-CM

## 2014-03-25 DIAGNOSIS — E669 Obesity, unspecified: Secondary | ICD-10-CM | POA: Diagnosis present

## 2014-03-25 DIAGNOSIS — Z86718 Personal history of other venous thrombosis and embolism: Secondary | ICD-10-CM

## 2014-03-25 DIAGNOSIS — R319 Hematuria, unspecified: Secondary | ICD-10-CM | POA: Diagnosis present

## 2014-03-25 DIAGNOSIS — Z79899 Other long term (current) drug therapy: Secondary | ICD-10-CM

## 2014-03-25 DIAGNOSIS — M1990 Unspecified osteoarthritis, unspecified site: Secondary | ICD-10-CM | POA: Diagnosis present

## 2014-03-25 DIAGNOSIS — Z87891 Personal history of nicotine dependence: Secondary | ICD-10-CM

## 2014-03-25 DIAGNOSIS — F329 Major depressive disorder, single episode, unspecified: Secondary | ICD-10-CM | POA: Diagnosis present

## 2014-03-25 DIAGNOSIS — J4489 Other specified chronic obstructive pulmonary disease: Secondary | ICD-10-CM | POA: Diagnosis present

## 2014-03-25 DIAGNOSIS — R0789 Other chest pain: Principal | ICD-10-CM | POA: Diagnosis present

## 2014-03-25 DIAGNOSIS — F3289 Other specified depressive episodes: Secondary | ICD-10-CM | POA: Diagnosis present

## 2014-03-25 DIAGNOSIS — R9431 Abnormal electrocardiogram [ECG] [EKG]: Secondary | ICD-10-CM

## 2014-03-25 DIAGNOSIS — J449 Chronic obstructive pulmonary disease, unspecified: Secondary | ICD-10-CM | POA: Diagnosis present

## 2014-03-25 DIAGNOSIS — R791 Abnormal coagulation profile: Secondary | ICD-10-CM | POA: Diagnosis present

## 2014-03-25 DIAGNOSIS — I10 Essential (primary) hypertension: Secondary | ICD-10-CM | POA: Diagnosis present

## 2014-03-25 HISTORY — DX: Unspecified convulsions: R56.9

## 2014-03-25 HISTORY — DX: Acute embolism and thrombosis of unspecified deep veins of unspecified lower extremity: I82.409

## 2014-03-25 LAB — BASIC METABOLIC PANEL
BUN: 10 mg/dL (ref 6–23)
CHLORIDE: 107 meq/L (ref 96–112)
CO2: 27 mEq/L (ref 19–32)
CREATININE: 0.88 mg/dL (ref 0.50–1.10)
Calcium: 9.4 mg/dL (ref 8.4–10.5)
GFR calc non Af Amer: 73 mL/min — ABNORMAL LOW (ref >90)
GFR, EST AFRICAN AMERICAN: 85 mL/min — AB (ref >90)
Glucose, Bld: 96 mg/dL (ref 70–99)
POTASSIUM: 4.1 meq/L (ref 3.7–5.3)
Sodium: 146 mEq/L (ref 137–147)

## 2014-03-25 LAB — CBC WITH DIFFERENTIAL/PLATELET
BASOS ABS: 0 10*3/uL (ref 0.0–0.1)
BASOS PCT: 1 % (ref 0–1)
EOS ABS: 0.1 10*3/uL (ref 0.0–0.7)
Eosinophils Relative: 1 % (ref 0–5)
HCT: 41 % (ref 36.0–46.0)
HEMOGLOBIN: 13.2 g/dL (ref 12.0–15.0)
Lymphocytes Relative: 30 % (ref 12–46)
Lymphs Abs: 1.3 10*3/uL (ref 0.7–4.0)
MCH: 28 pg (ref 26.0–34.0)
MCHC: 32.2 g/dL (ref 30.0–36.0)
MCV: 87 fL (ref 78.0–100.0)
Monocytes Absolute: 0.5 10*3/uL (ref 0.1–1.0)
Monocytes Relative: 12 % (ref 3–12)
NEUTROS ABS: 2.5 10*3/uL (ref 1.7–7.7)
NEUTROS PCT: 56 % (ref 43–77)
Platelets: 222 10*3/uL (ref 150–400)
RBC: 4.71 MIL/uL (ref 3.87–5.11)
RDW: 15.1 % (ref 11.5–15.5)
WBC: 4.4 10*3/uL (ref 4.0–10.5)

## 2014-03-25 LAB — URINALYSIS, ROUTINE W REFLEX MICROSCOPIC
BILIRUBIN URINE: NEGATIVE
Glucose, UA: NEGATIVE mg/dL
Ketones, ur: NEGATIVE mg/dL
Nitrite: NEGATIVE
Protein, ur: NEGATIVE mg/dL
Specific Gravity, Urine: 1.019 (ref 1.005–1.030)
UROBILINOGEN UA: 1 mg/dL (ref 0.0–1.0)
pH: 6.5 (ref 5.0–8.0)

## 2014-03-25 LAB — URINE MICROSCOPIC-ADD ON

## 2014-03-25 LAB — TROPONIN I
Troponin I: <0.3 ng/mL (ref <0.30)
Troponin I: <0.3 ng/mL (ref <0.30)

## 2014-03-25 LAB — PRO B NATRIURETIC PEPTIDE: PRO B NATRI PEPTIDE: 95.7 pg/mL (ref 0–125)

## 2014-03-25 LAB — POC OCCULT BLOOD, ED: Fecal Occult Bld: NEGATIVE

## 2014-03-25 LAB — PROTIME-INR
INR: 3.71 — AB (ref 0.00–1.49)
PROTHROMBIN TIME: 35.4 s — AB (ref 11.6–15.2)

## 2014-03-25 MED ORDER — TRAZODONE HCL 100 MG PO TABS
100.0000 mg | ORAL_TABLET | Freq: Every day | ORAL | Status: DC
  Administered 2014-03-26 (×2): 100 mg via ORAL
  Filled 2014-03-25 (×3): qty 1

## 2014-03-25 MED ORDER — VERAPAMIL HCL ER 240 MG PO TBCR
240.0000 mg | EXTENDED_RELEASE_TABLET | Freq: Every day | ORAL | Status: DC
  Administered 2014-03-25 – 2014-03-26 (×2): 240 mg via ORAL
  Filled 2014-03-25 (×3): qty 1

## 2014-03-25 MED ORDER — SIMVASTATIN 20 MG PO TABS
20.0000 mg | ORAL_TABLET | Freq: Every evening | ORAL | Status: DC
  Administered 2014-03-25 – 2014-03-26 (×2): 20 mg via ORAL
  Filled 2014-03-25 (×3): qty 1

## 2014-03-25 MED ORDER — ALBUTEROL SULFATE HFA 108 (90 BASE) MCG/ACT IN AERS: 2.0000 | INHALATION_SPRAY | Freq: Four times a day (QID) | RESPIRATORY_TRACT | Status: DC | PRN

## 2014-03-25 MED ORDER — WARFARIN - PHARMACIST DOSING INPATIENT: Freq: Every day | Status: DC

## 2014-03-25 MED ORDER — IOHEXOL 350 MG/ML SOLN
100.0000 mL | Freq: Once | INTRAVENOUS | Status: AC | PRN
  Administered 2014-03-25: 100 mL via INTRAVENOUS

## 2014-03-25 MED ORDER — SODIUM CHLORIDE 0.9 % IV BOLUS (SEPSIS)
500.0000 mL | Freq: Once | INTRAVENOUS | Status: AC
  Administered 2014-03-25: 500 mL via INTRAVENOUS

## 2014-03-25 MED ORDER — ALBUTEROL SULFATE (2.5 MG/3ML) 0.083% IN NEBU
2.5000 mg | INHALATION_SOLUTION | Freq: Four times a day (QID) | RESPIRATORY_TRACT | Status: DC | PRN
  Administered 2014-03-26 – 2014-03-27 (×4): 2.5 mg via RESPIRATORY_TRACT
  Filled 2014-03-25 (×3): qty 3

## 2014-03-25 MED ORDER — ALBUTEROL SULFATE (2.5 MG/3ML) 0.083% IN NEBU
5.0000 mg | INHALATION_SOLUTION | Freq: Once | RESPIRATORY_TRACT | Status: AC
  Administered 2014-03-25: 5 mg via RESPIRATORY_TRACT
  Filled 2014-03-25: qty 6

## 2014-03-25 MED ORDER — IPRATROPIUM-ALBUTEROL 0.5-2.5 (3) MG/3ML IN SOLN
3.0000 mL | RESPIRATORY_TRACT | Status: DC
  Administered 2014-03-25: 3 mL via RESPIRATORY_TRACT
  Filled 2014-03-25: qty 3

## 2014-03-25 MED ORDER — PNEUMOCOCCAL VAC POLYVALENT 25 MCG/0.5ML IJ INJ
0.5000 mL | INJECTION | INTRAMUSCULAR | Status: AC
  Administered 2014-03-26: 0.5 mL via INTRAMUSCULAR
  Filled 2014-03-25: qty 0.5

## 2014-03-25 MED ORDER — VERAPAMIL HCL ER 240 MG PO CP24: 240.0000 mg | ORAL_CAPSULE | Freq: Every day | ORAL | Status: DC

## 2014-03-25 MED ORDER — SALSALATE 750 MG PO TABS
750.0000 mg | ORAL_TABLET | Freq: Three times a day (TID) | ORAL | Status: DC | PRN
  Administered 2014-03-26 – 2014-03-27 (×4): 750 mg via ORAL
  Filled 2014-03-25 (×4): qty 1

## 2014-03-25 MED ORDER — ASPIRIN EC 325 MG PO TBEC
325.0000 mg | DELAYED_RELEASE_TABLET | Freq: Every day | ORAL | Status: DC
  Administered 2014-03-26 – 2014-03-27 (×2): 325 mg via ORAL
  Filled 2014-03-25 (×2): qty 1

## 2014-03-25 MED ORDER — CYCLOBENZAPRINE HCL 10 MG PO TABS
10.0000 mg | ORAL_TABLET | Freq: Three times a day (TID) | ORAL | Status: DC | PRN
  Administered 2014-03-25 – 2014-03-26 (×3): 10 mg via ORAL
  Filled 2014-03-25 (×6): qty 1

## 2014-03-25 NOTE — ED Provider Notes (Signed)
CSN: [id number]     Arrival date & time 03/25/14  [id number] History   First MD Initiated Contact with Patient 03/25/14 1114     Chief Complaint  Patient presents with  . Coagulation Disorder     (Consider location/radiation/quality/duration/timing/severity/associated sxs/prior Treatment) The history is provided by the patient. No language interpreter was used.  Amanda Christensen is a 53 year old female with past medical history of hypertension, arthritis, COPD on no oxygen therapy at home, asthma, heart murmur presenting to the ED with blowing out blood clots from her nostrils and area of ecchymosis to her right upper arm. Patient reported that her daughter pointed out that she had a black and blue on her right deltoid yesterday. Stated there is mild soreness to the black and blue and stated that there is a nodule palpated in the center of the ecchymosis. Stated that 2 days ago she started to have blood clots and bright red blood on her tissues whenever she blows her nose. Stated that this morning she started to experience shortness of breath more so than normal-stated that her shortness of breath is normally with exertion, stated that she started to experience with being sedentary. Stated that she felt nauseous and lightheaded last night. Patient reported that she's currently on Coumadin-stated that she used to take 12.5 mg daily, reported that approximately 2-3 weeks ago her primary care provider is changed to 10 mg daily secondary to INR levels being elevated. Reports she has her INR checked every month. Reported that she was placed on Coumadin secondary to 2010 right knee surgery wear for blood clots occurred after the surgery. Stated that she started to feel chest pressure starting yesterday, but stated that the chest pressure has been increasing and more persistant today. Stated that she used to smoke cigarettes, stated that she stopped approximately one year ago. Denied picking her nose, fainting, melena,  hematochezia, hematuria, nausea, vomiting, diarrhea, chest pain. PCP Dr. Kennon Holter  Past Medical History  Diagnosis Date  . Hypertension   . Depression   . Arthritis   . COPD (chronic obstructive pulmonary disease)   . Asthma   . Heart murmur    Past Surgical History  Procedure Laterality Date  . Hernia repair    . Cesarean section    . Joint replacement      right TKA   No family history on file. History  Substance Use Topics  . Smoking status: Never Smoker   . Smokeless tobacco: Not on file  . Alcohol Use: No   OB History   Grav Para Term Preterm Abortions TAB SAB Ect Mult Living                 Review of Systems  Constitutional: Negative for fever and chills.  Respiratory: Positive for cough and shortness of breath. Negative for chest tightness.   Cardiovascular: Positive for leg swelling. Negative for chest pain.  Gastrointestinal: Positive for nausea. Negative for vomiting, abdominal pain, diarrhea, constipation, blood in stool and anal bleeding.  Genitourinary: Negative for dysuria.  Musculoskeletal: Negative for back pain and neck pain.  Neurological: Positive for light-headedness. Negative for weakness and headaches.  All other systems reviewed and are negative.     Allergies  Penicillins and Tomato  Home Medications   Prior to Admission medications   Medication Sig Start Date End Date Taking? Authorizing Provider  albuterol (PROVENTIL HFA;VENTOLIN HFA) 108 (90 BASE) MCG/ACT inhaler Inhale 2 puffs into the lungs every 6 (six) hours as needed for  wheezing or shortness of breath.     Historical Provider, MD  budesonide-formoterol (SYMBICORT) 160-4.5 MCG/ACT inhaler Inhale 2 puffs into the lungs daily as needed (shortness of breath).     Historical Provider, MD  cetirizine (ZYRTEC) 10 MG tablet Take 10 mg by mouth at bedtime.     Historical Provider, MD  cyclobenzaprine (FLEXERIL) 10 MG tablet Take 10 mg by mouth 3 (three) times daily as needed for muscle  spasms.    Historical Provider, MD  diazepam (VALIUM) 5 MG tablet Take 1 tablet (5 mg total) by mouth every 6 (six) hours as needed for anxiety (spasms). 07/11/13   Hannah Muthersbaugh, PA-C  diclofenac sodium (VOLTAREN) 1 % GEL Apply 2 g topically daily as needed (muscle pain).     Historical Provider, MD  DULoxetine (CYMBALTA) 30 MG capsule Take 30 mg by mouth daily.    Historical Provider, MD  HYDROcodone-acetaminophen (NORCO/VICODIN) 5-325 MG per tablet Take 1 tablet by mouth every 4 (four) hours as needed. 10/18/13   Clayton Bibles, PA-C  phenytoin (DILANTIN) 100 MG ER capsule Take 100 mg by mouth at bedtime.     Historical Provider, MD  salsalate (DISALCID) 750 MG tablet Take 750 mg by mouth 3 (three) times daily.     Historical Provider, MD  simvastatin (ZOCOR) 20 MG tablet Take 20 mg by mouth every evening.    Historical Provider, MD  traMADol (ULTRAM) 50 MG tablet Take 100 mg by mouth every 8 (eight) hours as needed for pain.    Historical Provider, MD  traZODone (DESYREL) 100 MG tablet Take 100 mg by mouth at bedtime.     Historical Provider, MD  verapamil (VERELAN PM) 240 MG 24 hr capsule Take 240 mg by mouth at bedtime.     Historical Provider, MD  warfarin (COUMADIN) 10 MG tablet Take 10 mg by mouth See admin instructions. Take 10mg  one day, then take 12.5mg  (one 10mg  tablet and one 2.5mg  tablet) the next day, and continue alternating accordingly    Historical Provider, MD  warfarin (COUMADIN) 2.5 MG tablet Take 2.5 mg by mouth every other day. Taken with 10mg  tablet for total dose of 12.5mg  every other day    Historical Provider, MD   BP 119/79  Pulse 97  Temp(Src) 97.8 F (36.6 C) (Oral)  Resp 19  Wt 240 lb (108.863 kg)  SpO2 100% Physical Exam  Nursing note and vitals reviewed. Constitutional: She is oriented to person, place, and time. She appears well-developed and well-nourished. No distress.  HENT:  Head: Normocephalic and atraumatic.  Mouth/Throat: Oropharynx is clear and  moist. No oropharyngeal exudate.  Nose: Negative active bleeding noted. Dry blood in the nostrils bilaterally. Negative septal hematomas.  Eyes: Conjunctivae and EOM are normal. Pupils are equal, round, and reactive to light. Right eye exhibits no discharge. Left eye exhibits no discharge.  Neck: Normal range of motion. Neck supple. No tracheal deviation present.  Cardiovascular: Normal rate, regular rhythm and normal heart sounds.  Exam reveals no friction rub.   No murmur heard. Negative swelling or pitting edema localized to the lower extremities bilaterally   Pulmonary/Chest: Effort normal and breath sounds normal. No respiratory distress. She has no wheezes. She has no rales.  Patient is able to speak in full sentences without difficulty  Negative use of accessory muscles Negative stridor  Genitourinary:  Rectal Exam: Negative swelling, erythema, inflammation, lesions, sores, hemorrhoids noted to the anus. Negative masses palpated. Negative blood on glove. Brown stools on glove.  Musculoskeletal: Normal range of motion.  Full ROM to upper and lower extremities without difficulty noted, negative ataxia noted.  Lymphadenopathy:    She has no cervical adenopathy.  Neurological: She is alert and oriented to person, place, and time. No cranial nerve deficit. She exhibits normal muscle tone. Coordination normal.  Skin: Skin is warm and dry. No rash noted. She is not diaphoretic. No erythema.  Approximately 2 cm x 2.5 cm ecchymosis to the posterior aspect of the right deltoid with negative pain upon palpation. Small mobile nodule palpated. Negative swelling, erythema, inflammation, red streaks running down the RUE.   Psychiatric: She has a normal mood and affect. Her behavior is normal. Thought content normal.    ED Course  Procedures (including critical care time)  3:11 PM Patient seen and assessed by attending physician, Dr. Frederich Cha - recommended CT angio of chest to be performed to  rule out PE.  6:55 PM This provider spoke with Dr. Debara Pickett - cardiology to consult the patient.   7:06 PM This provider spoke with Dr. Verdia Kuba from Internal medicine - discussed case, history, presentation, labs and imaging in great detail. Patient to be seen and assessed and admitted to hospital.  Results for orders placed during the hospital encounter of 03/25/14  PROTIME-INR      Result Value Ref Range   Prothrombin Time 35.4 (*) 11.6 - 15.2 seconds   INR 3.71 (*) 0.00 - 1.49  CBC WITH DIFFERENTIAL      Result Value Ref Range   WBC 4.4  4.0 - 10.5 K/uL   RBC 4.71  3.87 - 5.11 MIL/uL   Hemoglobin 13.2  12.0 - 15.0 g/dL   HCT 41.0  36.0 - 46.0 %   MCV 87.0  78.0 - 100.0 fL   MCH 28.0  26.0 - 34.0 pg   MCHC 32.2  30.0 - 36.0 g/dL   RDW 15.1  11.5 - 15.5 %   Platelets 222  150 - 400 K/uL   Neutrophils Relative % 56  43 - 77 %   Neutro Abs 2.5  1.7 - 7.7 K/uL   Lymphocytes Relative 30  12 - 46 %   Lymphs Abs 1.3  0.7 - 4.0 K/uL   Monocytes Relative 12  3 - 12 %   Monocytes Absolute 0.5  0.1 - 1.0 K/uL   Eosinophils Relative 1  0 - 5 %   Eosinophils Absolute 0.1  0.0 - 0.7 K/uL   Basophils Relative 1  0 - 1 %   Basophils Absolute 0.0  0.0 - 0.1 K/uL  BASIC METABOLIC PANEL      Result Value Ref Range   Sodium 146  137 - 147 mEq/L   Potassium 4.1  3.7 - 5.3 mEq/L   Chloride 107  96 - 112 mEq/L   CO2 27  19 - 32 mEq/L   Glucose, Bld 96  70 - 99 mg/dL   BUN 10  6 - 23 mg/dL   Creatinine, Ser 0.88  0.50 - 1.10 mg/dL   Calcium 9.4  8.4 - 10.5 mg/dL   GFR calc non Af Amer 73 (*) >90 mL/min   GFR calc Af Amer 85 (*) >90 mL/min  PRO B NATRIURETIC PEPTIDE      Result Value Ref Range   Pro B Natriuretic peptide (BNP) 95.7  0 - 125 pg/mL  URINALYSIS, ROUTINE W REFLEX MICROSCOPIC      Result Value Ref Range   Color, Urine YELLOW  YELLOW  APPearance CLEAR  CLEAR   Specific Gravity, Urine 1.019  1.005 - 1.030   pH 6.5  5.0 - 8.0   Glucose, UA NEGATIVE  NEGATIVE mg/dL   Hgb urine  dipstick SMALL (*) NEGATIVE   Bilirubin Urine NEGATIVE  NEGATIVE   Ketones, ur NEGATIVE  NEGATIVE mg/dL   Protein, ur NEGATIVE  NEGATIVE mg/dL   Urobilinogen, UA 1.0  0.0 - 1.0 mg/dL   Nitrite NEGATIVE  NEGATIVE   Leukocytes, UA SMALL (*) NEGATIVE  TROPONIN I      Result Value Ref Range   Troponin I <0.30  <0.30 ng/mL  URINE MICROSCOPIC-ADD ON      Result Value Ref Range   Squamous Epithelial / LPF FEW (*) RARE   WBC, UA 0-2  <3 WBC/hpf   RBC / HPF 3-6  <3 RBC/hpf   Bacteria, UA FEW (*) RARE  TROPONIN I      Result Value Ref Range   Troponin I <0.30  <0.30 ng/mL  POC OCCULT BLOOD, ED      Result Value Ref Range   Fecal Occult Bld NEGATIVE  NEGATIVE    Labs Review Labs Reviewed  PROTIME-INR - Abnormal; Notable for the following:    Prothrombin Time 35.4 (*)    INR 3.71 (*)    All other components within normal limits  BASIC METABOLIC PANEL - Abnormal; Notable for the following:    GFR calc non Af Amer 73 (*)    GFR calc Af Amer 85 (*)    All other components within normal limits  URINALYSIS, ROUTINE W REFLEX MICROSCOPIC - Abnormal; Notable for the following:    Hgb urine dipstick SMALL (*)    Leukocytes, UA SMALL (*)    All other components within normal limits  URINE MICROSCOPIC-ADD ON - Abnormal; Notable for the following:    Squamous Epithelial / LPF FEW (*)    Bacteria, UA FEW (*)    All other components within normal limits  CBC WITH DIFFERENTIAL  PRO B NATRIURETIC PEPTIDE  TROPONIN I  TROPONIN I  POC OCCULT BLOOD, ED    Imaging Review Dg Chest 2 View  03/25/2014   CLINICAL DATA:  Chest tightness, coagulation disorder and history of pulmonary embolism.  EXAM: CHEST - 2 VIEW  COMPARISON:  03/27/2013  FINDINGS: The heart size and mediastinal contours are within normal limits. There is no evidence of pulmonary edema, consolidation, pneumothorax, nodule or pleural fluid. The visualized skeletal structures are unremarkable.  IMPRESSION: No active disease.    Electronically Signed   By: Aletta Edouard M.D.   On: 03/25/2014 13:22   Ct Angio Chest Pe W/cm &/or Wo Cm  03/25/2014   CLINICAL DATA:  Chest pain.  Shortness of breath.  EXAM: CT ANGIOGRAPHY CHEST WITH CONTRAST  TECHNIQUE: Multidetector CT imaging of the chest was performed using the standard protocol during bolus administration of intravenous contrast. Multiplanar CT image reconstructions and MIPs were obtained to evaluate the vascular anatomy.  CONTRAST:  140mL OMNIPAQUE IOHEXOL 350 MG/ML SOLN  COMPARISON:  DG CHEST 2 VIEW dated 03/25/2014; CT ANGIO CHEST W/CM &/OR WO/CM dated 03/27/2013; CT ANGIO CHEST W/CM &/OR WO/CM dated 04/16/2011  FINDINGS: Body habitus reduces diagnostic sensitivity and specificity. No filling defect is identified in the pulmonary arterial tree to suggest pulmonary embolus. No acute aortic findings.  3 mm right upper lobe subpleural nodule, image 30 of series 6, stable from 04/16/2011 and considered benign.  Mild right lower lobe scarring noted in the posterior  basal segment. 3 mm right lower lobe subpleural nodule, image 59 of series 6, stable from 2012 and considered benign.  Airway thickening is present, suggesting bronchitis or reactive airways disease. Bandlike opacity peripherally in the left upper lobe favors atelectasis.  Severe left degenerative glenohumeral arthropathy for example is shown on image 90 of series 7.  Review of the MIP images confirms the above findings.  IMPRESSION: 1. No embolus or acute aortic findings. Reduced sensitivity due to body habitus. 2. Mild cardiomegaly 3. Prominent degenerative left glenohumeral arthropathy. 4. Airway thickening is present, suggesting bronchitis or reactive airways disease.   Electronically Signed   By: Sherryl Barters M.D.   On: 03/25/2014 17:31     EKG Interpretation   Date/Time:  Wednesday Mar 25 2014 10:12:02 EDT Ventricular Rate:  78 PR Interval:  144 QRS Duration: 78 QT Interval:  380 QTC Calculation: 433 R Axis:    17 Text Interpretation:  Normal sinus rhythm Minimal voltage criteria for  LVH, may be normal variant Cannot rule out Anterior infarct , age  undetermined T wave abnormality, consider inferior ischemia Abnormal ECG  inferior T wave changes new since May 2014 Confirmed by [facility]  MD,  [place] [phone]) on 03/25/2014 12:20:09 PM      MDM   Final diagnoses:  Chest pain  Anticoagulated on warfarin  COPD (chronic obstructive pulmonary disease)   Medications  iohexol (OMNIPAQUE) 350 MG/ML injection 100 mL (100 mLs Intravenous Contrast Given 03/25/14 1708)  albuterol (PROVENTIL) (2.5 MG/3ML) 0.083% nebulizer solution 5 mg (5 mg Nebulization Given 03/25/14 1806)  sodium chloride 0.9 % bolus 500 mL (500 mLs Intravenous New Bag/Given 03/25/14 1854)   Filed Vitals:   03/25/14 1542 03/25/14 1600 03/25/14 1730 03/25/14 1900  BP: 114/93 114/73 137/70 119/79  Pulse: 80 76 71 97  Temp:      TempSrc:      Resp: 16 19    Weight:      SpO2: 100% 100% 100% 100%    EKG normal sinus rhythm with a heart rate of 70 beats per minute with minimal voltage criteria for LVH-consider inferior ischemia abnormal EKG inferior T waves new since May 2014. Troponin negative elevation. Second troponin negative elevation. CBC negative elevated white blood cell count of less shift noted. Hemoglobin 13.2, hematocrit 41.0 - negative drop. BMP negative findings-kidneys functioning well. BMP negative findings-negative elevation. Pro time elevated at 35.4, INR elevated at 3.71. Urinalysis noted small hemoglobin and small leukocytes with negative pyuria. Negative nitrites noted. Chest x-ray negative for acute cardiopulmonary disease. Negative cardiomegaly noted. CT angiogram of chest negative for embolus or acute aortic findings. Mild cardia megaly noted. Airway thickening is present, suggesting bronchitis or reactive airway disease. Fecal occult negative.  Patient given albuterol nebulizer treatment with improvement in breathing.  Negative findings of supra therapeutic therapy in anticoagulation process. Negative drop in Hgb, negative fecal occult noted.  Set of troponin negative elevation. Labs reassuring. CT angiogram of chest negative for pulmonary embolism, findings consistent with Procardia stash patient's history of COPD. EKG noted changes in the inferior leads with T-wave inversion-patient expressing chest discomfort. Cardiology to consult. Discussed case with internal medicine who is to admit patient under telemetry. Discussed plan for admission with patient who agreed and understood. Patient stable for transfer.  Jamse Mead, PA-C 03/25/14 2259

## 2014-03-25 NOTE — Consult Note (Signed)
Cardiology IP Consult  Reason for Consult: chest pain Referring Physician: ED  HPI: Amanda Christensen is a 53 y.o.female with hx below who presented to the ED because of concern that her right arm had some bruising. She is on coumadin and was worried about bleeding. She also reported some epistaxis. Incidentally, she reported some increase in effort dyspnea and occasional chest pressures that has been progressive. She denies any active sx of angina. Her ECG and troponin in the ED were reassuring. She is being placed in obs for further evaluation and mgmt.    Past Medical History  Diagnosis Date  . Hypertension   . Depression   . Arthritis   . COPD (chronic obstructive pulmonary disease)   . Asthma   . Heart murmur     Past Surgical History  Procedure Laterality Date  . Hernia repair    . Cesarean section    . Joint replacement      right TKA    No family history on file.  Social History:  reports that she has never smoked. She does not have any smokeless tobacco history on file. She reports that she does not drink alcohol or use illicit drugs.  Allergies:  Allergies  Allergen Reactions  . Penicillins     "Passes out"  . Tomato Hives    From acid in food    No current facility-administered medications for this encounter.   Current Outpatient Prescriptions  Medication Sig Dispense Refill  . albuterol (PROVENTIL HFA;VENTOLIN HFA) 108 (90 BASE) MCG/ACT inhaler Inhale 2 puffs into the lungs every 6 (six) hours as needed for wheezing or shortness of breath.       . cetirizine (ZYRTEC) 10 MG tablet Take 10 mg by mouth at bedtime.       . cyclobenzaprine (FLEXERIL) 10 MG tablet Take 10 mg by mouth 3 (three) times daily as needed for muscle spasms.      . diclofenac sodium (VOLTAREN) 1 % GEL Apply 2 g topically daily as needed (muscle pain).       . salsalate (DISALCID) 750 MG tablet Take 750 mg by mouth 3 (three) times daily as needed (for muscle spasms).       . simvastatin (ZOCOR)  20 MG tablet Take 20 mg by mouth every evening.      Marland Kitchen terconazole (TERAZOL 7) 0.4 % vaginal cream Place 1 applicator vaginally at bedtime.      . traZODone (DESYREL) 100 MG tablet Take 100 mg by mouth at bedtime.       . verapamil (VERELAN PM) 240 MG 24 hr capsule Take 240 mg by mouth at bedtime.       Marland Kitchen warfarin (COUMADIN) 10 MG tablet Take 10 mg by mouth daily.         ROS: A full review of systems is obtained and is negative except as noted in the HPI.  Physical Exam: Blood pressure 122/84, pulse 90, temperature 97.8 F (36.6 C), temperature source Oral, resp. rate 19, weight 108.863 kg (240 lb), SpO2 100.00%.  GENERAL: no acute distress. obese EYES: Extra ocular movements are intact. There is no lid lag. Sclera is anicteric.  ENT: Oropharynx is clear. Dentition is within normal limits.  NECK: Supple. The thyroid is not enlarged.  LYMPH: There are no masses or lymphadenopathy present.  HEART: Regular rate and rhythm with no m/g/r.  Normal S1/S2. No JVD LUNGS: Clear to auscultation There are no rales, rhonchi, or wheezes.  ABDOMEN: Soft, non-tender, and non-distended  with normoactive bowel sounds. EXTREMITIES: No clubbing, cyanosis, or edema.  PULSES: Carotids were +2 and equal bilaterally with no bruits. Femoral pulses were +2 and equal bilaterally. DP/PT pulses were +2 and equal bilaterally.  SKIN: Warm, dry, and intact.  NEUROLOGIC: The patient was oriented to person, place, and time. No overt neurologic deficits were detected.  PSYCH: Normal judgment and insight, mood is appropriate.    Results: Results for orders placed during the hospital encounter of 03/25/14 (from the past 24 hour(s))  PROTIME-INR     Status: Abnormal   Collection Time    03/25/14 10:22 AM      Result Value Ref Range   Prothrombin Time 35.4 (*) 11.6 - 15.2 seconds   INR 3.71 (*) 0.00 - 1.49  CBC WITH DIFFERENTIAL     Status: None   Collection Time    03/25/14 10:22 AM      Result Value Ref Range    WBC 4.4  4.0 - 10.5 K/uL   RBC 4.71  3.87 - 5.11 MIL/uL   Hemoglobin 13.2  12.0 - 15.0 g/dL   HCT 41.0  36.0 - 46.0 %   MCV 87.0  78.0 - 100.0 fL   MCH 28.0  26.0 - 34.0 pg   MCHC 32.2  30.0 - 36.0 g/dL   RDW 15.1  11.5 - 15.5 %   Platelets 222  150 - 400 K/uL   Neutrophils Relative % 56  43 - 77 %   Neutro Abs 2.5  1.7 - 7.7 K/uL   Lymphocytes Relative 30  12 - 46 %   Lymphs Abs 1.3  0.7 - 4.0 K/uL   Monocytes Relative 12  3 - 12 %   Monocytes Absolute 0.5  0.1 - 1.0 K/uL   Eosinophils Relative 1  0 - 5 %   Eosinophils Absolute 0.1  0.0 - 0.7 K/uL   Basophils Relative 1  0 - 1 %   Basophils Absolute 0.0  0.0 - 0.1 K/uL  BASIC METABOLIC PANEL     Status: Abnormal   Collection Time    03/25/14 10:22 AM      Result Value Ref Range   Sodium 146  137 - 147 mEq/L   Potassium 4.1  3.7 - 5.3 mEq/L   Chloride 107  96 - 112 mEq/L   CO2 27  19 - 32 mEq/L   Glucose, Bld 96  70 - 99 mg/dL   BUN 10  6 - 23 mg/dL   Creatinine, Ser 0.88  0.50 - 1.10 mg/dL   Calcium 9.4  8.4 - 10.5 mg/dL   GFR calc non Af Amer 73 (*) >90 mL/min   GFR calc Af Amer 85 (*) >90 mL/min  PRO B NATRIURETIC PEPTIDE     Status: None   Collection Time    03/25/14 10:22 AM      Result Value Ref Range   Pro B Natriuretic peptide (BNP) 95.7  0 - 125 pg/mL  TROPONIN I     Status: None   Collection Time    03/25/14 10:22 AM      Result Value Ref Range   Troponin I <0.30  <0.30 ng/mL  URINALYSIS, ROUTINE W REFLEX MICROSCOPIC     Status: Abnormal   Collection Time    03/25/14 12:58 PM      Result Value Ref Range   Color, Urine YELLOW  YELLOW   APPearance CLEAR  CLEAR   Specific Gravity, Urine 1.019  1.005 - 1.030  pH 6.5  5.0 - 8.0   Glucose, UA NEGATIVE  NEGATIVE mg/dL   Hgb urine dipstick SMALL (*) NEGATIVE   Bilirubin Urine NEGATIVE  NEGATIVE   Ketones, ur NEGATIVE  NEGATIVE mg/dL   Protein, ur NEGATIVE  NEGATIVE mg/dL   Urobilinogen, UA 1.0  0.0 - 1.0 mg/dL   Nitrite NEGATIVE  NEGATIVE   Leukocytes, UA  SMALL (*) NEGATIVE  URINE MICROSCOPIC-ADD ON     Status: Abnormal   Collection Time    03/25/14 12:58 PM      Result Value Ref Range   Squamous Epithelial / LPF FEW (*) RARE   WBC, UA 0-2  <3 WBC/hpf   RBC / HPF 3-6  <3 RBC/hpf   Bacteria, UA FEW (*) RARE  TROPONIN I     Status: None   Collection Time    03/25/14  5:56 PM      Result Value Ref Range   Troponin I <0.30  <0.30 ng/mL  POC OCCULT BLOOD, ED     Status: None   Collection Time    03/25/14  6:33 PM      Result Value Ref Range   Fecal Occult Bld NEGATIVE  NEGATIVE    CXR: reviewed. CT Angio reviewed.  EKG: Sinus rhythm. T wave flattening noted in inferior leads. Similar to historic.   Assessment/Plan: - Atypical chest pain, likely non-cardiac  - HTN -Obesity  Recs:  - rule out acute MI - if troponin remains negative, would plan for exercise stress test tomorrow AM - would make NPO p midnight for this test - will follow results of this study. If negative, no further cardiac testing or follow up is necessary at this time    Theressa Stamps 03/25/2014, 8:49 PM

## 2014-03-25 NOTE — Progress Notes (Signed)
ANTICOAGULATION CONSULT NOTE - Initial Consult  Pharmacy Consult for warfarin Indication: hx DVT  Allergies  Allergen Reactions  . Penicillins     "Passes out"  . Tomato Hives    From acid in food    Patient Measurements: Weight: 240 lb (108.863 kg)  Vital Signs: Temp: 97.8 F (36.6 C) (05/06 1118) Temp src: Oral (05/06 1118) BP: 122/84 mmHg (05/06 2000) Pulse Rate: 90 (05/06 2000)  Labs:  Recent Labs  03/25/14 1022 03/25/14 1756  HGB 13.2  --   HCT 41.0  --   PLT 222  --   LABPROT 35.4*  --   INR 3.71*  --   CREATININE 0.88  --   TROPONINI <0.30 <0.30    The CrCl is unknown because both a height and weight (above a minimum accepted value) are required for this calculation.   Medical History: Past Medical History  Diagnosis Date  . Hypertension   . Depression   . Arthritis   . COPD (chronic obstructive pulmonary disease)   . Asthma   . Heart murmur     Medications:  See med history  Assessment: 65 yof presented to the ED with a R arm bruise. She is on chronic coumadin for recurrent DVT. INR is elevated at 3.71. CBC is WNL. No overt bleeding except for the arm bruise. FOB is negative.   Goal of Therapy:  INR 2-3   Plan:  1. No coumadin tonight 2. Daily INR  Rande Lawman Elisa Kutner 03/25/2014,9:11 PM

## 2014-03-25 NOTE — ED Notes (Addendum)
Pt reports yesterday noticed dark bruise to right upper arm, is taking coumadin, also bleeding from her nose when she blows it. Pt is a x 4. In NAD. Report she feels lightheaded and feels funny. Denies CP. VSS

## 2014-03-25 NOTE — H&P (Signed)
Date: 03/25/2014               Patient Name:  Amanda Christensen MRN: [id number]  DOB: 1961-07-21 Age / Sex: 53 y.o., female   PCP: Provider Not In System         Medical Service: Internal Medicine Teaching Service         Attending Physician: Dr. Orpah Greek, *    First Contact: Dr. Michail Jewels Pager: [phone]  Second Contact: Dr. Karlyn Agee Pager: [phone]       After Hours (After 5p/  First Contact Pager: [phone]  weekends / holidays): Second Contact Pager: [phone]   Chief Complaint: bruise on right arm  History of Present Illness: Amanda Christensen is a 53 yo AA female with PMH of HTN, COPD w/ Asthma, Recurrent DVT on coumadin, OA with right knee replacement in 2012.  She presents to [facility] today after her daughter noticed a bruise on the posterior aspect of her right arm.  She also reports that for the last three days she has noticed blood clots when she blows her nose.  She reports her INR is checked monthly and required frequent adjustments, she was concerned she was again supratheraputic and came to the ED for evaluation.  She denies any blood per rectum, hematuria, hematemsis, or other bleeding.  In the ED she apparently reported some shortness of breath and chest tightness, along with a 1 day history of nausea and light headedness.  She reports that she frequently has pain in her back and chest which she describes as "muscle spasms", she did have some chest discomfort earlier today that was relieved with albuterol nebulizer in the ED, although she does still have some mild chest heaviness that is unchanged from baseline.  Her SOB did improve with the nebulizer and denies any current SOB.  She denies any history of chest pain or pressure and reports she can walk a flight of stairs without chest discomfort at home.  She is a former smoker>> 20 pack year quit 1 year ago. Meds: No current facility-administered medications for this encounter.   Current Outpatient Prescriptions    Medication Sig Dispense Refill  . albuterol (PROVENTIL HFA;VENTOLIN HFA) 108 (90 BASE) MCG/ACT inhaler Inhale 2 puffs into the lungs every 6 (six) hours as needed for wheezing or shortness of breath.       . cetirizine (ZYRTEC) 10 MG tablet Take 10 mg by mouth at bedtime.       . cyclobenzaprine (FLEXERIL) 10 MG tablet Take 10 mg by mouth 3 (three) times daily as needed for muscle spasms.      . diclofenac sodium (VOLTAREN) 1 % GEL Apply 2 g topically daily as needed (muscle pain).       . salsalate (DISALCID) 750 MG tablet Take 750 mg by mouth 3 (three) times daily as needed (for muscle spasms).       . simvastatin (ZOCOR) 20 MG tablet Take 20 mg by mouth every evening.      Marland Kitchen terconazole (TERAZOL 7) 0.4 % vaginal cream Place 1 applicator vaginally at bedtime.      . traZODone (DESYREL) 100 MG tablet Take 100 mg by mouth at bedtime.       . verapamil (VERELAN PM) 240 MG 24 hr capsule Take 240 mg by mouth at bedtime.       Marland Kitchen warfarin (COUMADIN) 10 MG tablet Take 10 mg by mouth daily.         Allergies:  Allergies as of 03/25/2014 - Review Complete 03/25/2014  Allergen Reaction Noted  . Penicillins  01/07/2012  . Tomato Hives 03/25/2014   Past Medical History  Diagnosis Date  . Hypertension   . Depression   . Arthritis   . COPD (chronic obstructive pulmonary disease)   . Asthma   . Heart murmur    Past Surgical History  Procedure Laterality Date  . Hernia repair    . Cesarean section    . Joint replacement      right TKA   No family history on file. History   Social History  . Marital Status: Married    Spouse Name: N/A    Number of Children: N/A  . Years of Education: N/A   Occupational History  . Not on file.   Social History Main Topics  . Smoking status: Never Smoker   . Smokeless tobacco: Not on file  . Alcohol Use: No  . Drug Use: No  . Sexual Activity: Not on file   Other Topics Concern  . Not on file   Social History Narrative  . No narrative on file     Review of Systems: Review of Systems  Constitutional: Negative for fever, chills, weight loss and malaise/fatigue.  HENT: Positive for nosebleeds. Negative for sore throat.   Eyes: Negative for blurred vision (admits gradual age related visual loss over many years).  Respiratory: Positive for shortness of breath and wheezing. Negative for cough.   Cardiovascular: Positive for chest pain (described as chest heaviness). Negative for palpitations, orthopnea and leg swelling.  Gastrointestinal: Positive for constipation (for last 2 weeks, last BM yesterday>>hard). Negative for heartburn, vomiting, abdominal pain, diarrhea, blood in stool and melena.  Genitourinary: Negative for dysuria, urgency, frequency and hematuria.  Musculoskeletal: Positive for back pain and joint pain (all over). Negative for falls.  Skin: Negative for rash.  Neurological: Negative for dizziness, sensory change and weakness.     Physical Exam: Blood pressure 122/84, pulse 90, temperature 97.8 F (36.6 C), temperature source Oral, resp. rate 19, weight 240 lb (108.863 kg), SpO2 100.00%. Physical Exam  Nursing note and vitals reviewed. Constitutional: She is oriented to person, place, and time and well-developed, well-nourished, and in no distress.  HENT:  Head: Normocephalic and atraumatic.  Eyes: Conjunctivae are normal. Pupils are equal, round, and reactive to light.  Cardiovascular: Normal rate, regular rhythm, normal heart sounds and intact distal pulses.   No murmur heard. Pulmonary/Chest: Effort normal and breath sounds normal. No respiratory distress. She has no wheezes. She has no rales. She exhibits no tenderness.  Abdominal: Soft. Bowel sounds are normal. She exhibits no distension. There is tenderness (mild RLQ). There is no rebound and no guarding.  Musculoskeletal: She exhibits edema (trace pedal edema).  Neurological: She is alert and oriented to person, place, and time.  Skin: Skin is warm and dry.     Wt Readings from Last 5 Encounters:  03/25/14 240 lb (108.863 kg)  03/27/13 134 lb (60.782 kg)    Lab results: Basic Metabolic Panel:  Recent Labs  03/25/14 1022  NA 146  K 4.1  CL 107  CO2 27  GLUCOSE 96  BUN 10  CREATININE 0.88  CALCIUM 9.4   CBC:  Recent Labs  03/25/14 1022  WBC 4.4  NEUTROABS 2.5  HGB 13.2  HCT 41.0  MCV 87.0  PLT 222   Cardiac Enzymes:  Recent Labs  03/25/14 1022 03/25/14 1756  TROPONINI <0.30 <0.30   BNP:  Recent Labs  03/25/14 1022  PROBNP 95.7   Coagulation:  Recent Labs  03/25/14 1022  LABPROT 35.4*  INR 3.71*   Urine Drug Screen: Drugs of Abuse     Component Value Date/Time   LABOPIA NONE DETECTED 04/17/2011 0554   COCAINSCRNUR NONE DETECTED 04/17/2011 0554   LABBENZ NONE DETECTED 04/17/2011 0554   AMPHETMU NONE DETECTED 04/17/2011 0554   THCU NONE DETECTED 04/17/2011 0554   LABBARB  Value: NONE DETECTED        DRUG SCREEN FOR MEDICAL PURPOSES ONLY.  IF CONFIRMATION IS NEEDED FOR ANY PURPOSE, NOTIFY LAB WITHIN 5 DAYS.        LOWEST DETECTABLE LIMITS FOR URINE DRUG SCREEN Drug Class       Cutoff (ng/mL) Amphetamine      1000 Barbiturate      200 Benzodiazepine   [id number] Tricyclics       [id number] Opiates          300 Cocaine          300 THC              50 04/17/2011 0554    Alcohol Level: No results found for this basename: ETH,  in the last 72 hours Urinalysis:  Recent Labs  03/25/14 [place] 1.019  PHURINE 6.5  [place] 1.0  NITRITE NEGATIVE  LEUKOCYTESUR SMALL*   Imaging results:  Dg Chest 2 View  03/25/2014   CLINICAL DATA:  Chest tightness, coagulation disorder and history of pulmonary embolism.  EXAM: CHEST - 2 VIEW  COMPARISON:  03/27/2013  FINDINGS: The heart size and mediastinal contours are within normal limits. There is no evidence of pulmonary edema, consolidation, pneumothorax,  nodule or pleural fluid. The visualized skeletal structures are unremarkable.  IMPRESSION: No active disease.   Electronically Signed   By: Aletta Edouard M.D.   On: 03/25/2014 13:22   Ct Angio Chest Pe W/cm &/or Wo Cm  03/25/2014   CLINICAL DATA:  Chest pain.  Shortness of breath.  EXAM: CT ANGIOGRAPHY CHEST WITH CONTRAST  TECHNIQUE: Multidetector CT imaging of the chest was performed using the standard protocol during bolus administration of intravenous contrast. Multiplanar CT image reconstructions and MIPs were obtained to evaluate the vascular anatomy.  CONTRAST:  186mL OMNIPAQUE IOHEXOL 350 MG/ML SOLN  COMPARISON:  DG CHEST 2 VIEW dated 03/25/2014; CT ANGIO CHEST W/CM &/OR WO/CM dated 03/27/2013; CT ANGIO CHEST W/CM &/OR WO/CM dated 04/16/2011  FINDINGS: Body habitus reduces diagnostic sensitivity and specificity. No filling defect is identified in the pulmonary arterial tree to suggest pulmonary embolus. No acute aortic findings.  3 mm right upper lobe subpleural nodule, image 30 of series 6, stable from 04/16/2011 and considered benign.  Mild right lower lobe scarring noted in the posterior basal segment. 3 mm right lower lobe subpleural nodule, image 59 of series 6, stable from 2012 and considered benign.  Airway thickening is present, suggesting bronchitis or reactive airways disease. Bandlike opacity peripherally in the left upper lobe favors atelectasis.  Severe left degenerative glenohumeral arthropathy for example is shown on image 90 of series 7.  Review of the MIP images confirms the above findings.  IMPRESSION: 1. No embolus or acute aortic findings. Reduced sensitivity due to body habitus. 2. Mild cardiomegaly 3. Prominent degenerative left glenohumeral arthropathy. 4. Airway thickening is present, suggesting bronchitis or reactive airways disease.   Electronically Signed  By: Sherryl Barters M.D.   On: 03/25/2014 17:31    Other results: EKG: NSR, normal axis, TWI in leads II, III, aVF that  were not present on previous EKG in 03/27/13 QTc 433.  Assessment & Plan by Problem:   Chest tightness or pressure - Patient reported some chest pressure, and nausea in the ED, relieved by albuterol, minimal chest heaviness currently. EKG significant for new inferior TWI, CEx2 negative in ED.  She has a TIMI risk score of 0 (low risk) and a Heart Score of 4 (moderate risk). - Cardiology consulted by the ED - Admit to Telemetry - ASA 325mg  - NO BB given history of Asthma - Check one additional CE - AM EKG - Check Hgb A1c, Lipid profile, TSH, and HIV for risk stratification. - Patient reports she may have had a EST 10 years ago, no records of this, no history of heart catheretization. Patient may be candidate for inpatient versus outpatient EST to risk stratify. -2D echo given 100 lb weight gain in 1 year (proBNP wnl)     COPD (chronic obstructive pulmonary disease) with Asthma - No active wheezing or SOB - Continue Duoneb q4    Hypertension -Continue Verapamil    Supratherapeutic INR -INR 3.71, Hgb stable, no signs of active bleeding. - Hold warfarin - Consult pharmacy for discharge dosing. (Currently taking 10mg  daily)    Osteoarthritis -Continue Salsalate TID    History of DVT (deep vein thrombosis) -Supratheraputic on INR unclear at this time if lifetime indication warranted, per chart review patient appears to have had a provoked DVT and second/progression of DVT 2 weeks later when her INR was subtheraputic.  DVT PPx: On Warfarin Diet: Heart Code Status: Full Dispo: Disposition is deferred at this time, awaiting improvement of current medical problems. Anticipated discharge in approximately 1 day(s).   The patient does have a current PCP [facility]) and does not need an [facility] hospital follow-up appointment after discharge.  The patient does not have transportation limitations that hinder transportation to clinic appointments.  Signed: Joni Reining, DO 03/25/2014, 8:31 PM

## 2014-03-26 ENCOUNTER — Encounter (HOSPITAL_COMMUNITY): Payer: Self-pay | Admitting: Internal Medicine

## 2014-03-26 ENCOUNTER — Inpatient Hospital Stay (HOSPITAL_COMMUNITY): Payer: Medicaid Other

## 2014-03-26 DIAGNOSIS — R791 Abnormal coagulation profile: Secondary | ICD-10-CM

## 2014-03-26 DIAGNOSIS — R079 Chest pain, unspecified: Secondary | ICD-10-CM | POA: Diagnosis present

## 2014-03-26 DIAGNOSIS — R9431 Abnormal electrocardiogram [ECG] [EKG]: Secondary | ICD-10-CM

## 2014-03-26 LAB — PROTIME-INR
INR: 3.83 — AB (ref 0.00–1.49)
PROTHROMBIN TIME: 36.2 s — AB (ref 11.6–15.2)

## 2014-03-26 LAB — LIPID PANEL
Cholesterol: 156 mg/dL (ref 0–200)
HDL: 55 mg/dL (ref >39)
LDL Cholesterol: 85 mg/dL (ref 0–99)
Total CHOL/HDL Ratio: 2.8 RATIO
Triglycerides: 81 mg/dL (ref <150)
VLDL: 16 mg/dL (ref 0–40)

## 2014-03-26 LAB — TSH: TSH: 1.32 u[IU]/mL (ref 0.350–4.500)

## 2014-03-26 LAB — HEMOGLOBIN A1C
Hgb A1c MFr Bld: 5.8 % — ABNORMAL HIGH (ref <5.7)
MEAN PLASMA GLUCOSE: 120 mg/dL — AB (ref <117)

## 2014-03-26 LAB — HIV ANTIBODY (ROUTINE TESTING W REFLEX): HIV 1&2 Ab, 4th Generation: NONREACTIVE

## 2014-03-26 LAB — TROPONIN I: Troponin I: <0.3 ng/mL (ref <0.30)

## 2014-03-26 MED ORDER — REGADENOSON 0.4 MG/5ML IV SOLN
INTRAVENOUS | Status: AC
  Filled 2014-03-26: qty 5

## 2014-03-26 MED ORDER — ACETAMINOPHEN 325 MG PO TABS
650.0000 mg | ORAL_TABLET | ORAL | Status: DC | PRN
  Administered 2014-03-26: 650 mg via ORAL

## 2014-03-26 MED ORDER — ALPRAZOLAM 0.25 MG PO TABS: 0.2500 mg | ORAL_TABLET | Freq: Two times a day (BID) | ORAL | Status: DC | PRN

## 2014-03-26 MED ORDER — IPRATROPIUM-ALBUTEROL 0.5-2.5 (3) MG/3ML IN SOLN
3.0000 mL | Freq: Two times a day (BID) | RESPIRATORY_TRACT | Status: DC
  Administered 2014-03-26 – 2014-03-27 (×2): 3 mL via RESPIRATORY_TRACT
  Filled 2014-03-26 (×2): qty 3

## 2014-03-26 MED ORDER — ONDANSETRON HCL 4 MG/2ML IJ SOLN: 4.0000 mg | Freq: Four times a day (QID) | INTRAMUSCULAR | Status: DC | PRN

## 2014-03-26 MED ORDER — ONDANSETRON HCL 4 MG PO TABS: 4.0000 mg | ORAL_TABLET | Freq: Three times a day (TID) | ORAL | Status: DC | PRN

## 2014-03-26 MED ORDER — DOCUSATE SODIUM 100 MG PO CAPS
100.0000 mg | ORAL_CAPSULE | Freq: Every day | ORAL | Status: DC
  Administered 2014-03-26 – 2014-03-27 (×2): 100 mg via ORAL
  Filled 2014-03-26 (×2): qty 1

## 2014-03-26 MED ORDER — ZOLPIDEM TARTRATE 5 MG PO TABS: 5.0000 mg | ORAL_TABLET | Freq: Every evening | ORAL | Status: DC | PRN

## 2014-03-26 MED ORDER — ACETAMINOPHEN 325 MG PO TABS
ORAL_TABLET | ORAL | Status: AC
  Filled 2014-03-26: qty 2

## 2014-03-26 MED ORDER — REGADENOSON 0.4 MG/5ML IV SOLN
0.4000 mg | Freq: Once | INTRAVENOUS | Status: AC
  Administered 2014-03-26: 0.4 mg via INTRAVENOUS
  Filled 2014-03-26: qty 5

## 2014-03-26 NOTE — H&P (Signed)
  Date: 03/26/2014  Patient name: Amanda Christensen  Medical record number: [id number]  Date of birth: 1961-01-13   I have seen and evaluated Donivan Scull and discussed their care with the Residency Team. Ms Escandon came to ED bc of bruise on her posterior R upper arm. Her INR was found to be 3.71. While in the ED, she c/o chest discomfort, dyspnea, nauseous, and lightheaded. She has had these sxs before, and her PCP tx her for muscle spasms. However, she had new T wave inversions in the inf leads which were new.  She has a h.o R TKR in March 06, 2011. Complicated by DVT.  03/11/12 : dopplers negative for DVT  03/13/2011 D/C to SNF  At some point was dx with DVT - date and findings unknown  Admitted 04/16/11 : She had a repeat imaging done that showed age indeterminate DVT in the right posterior tibial and popliteal veins and acute occlusive DVT throughout the femoral veins and nonocclusive acute DVT in the common femoral vein. (original report available in EMR) All this was on the right. At this point, her INR was noted to be subtherapeutic at 1.79.  04/2011 : returned to OR for visualization in OR of knee 2/2 pt issues  03/24/2012 : No evidence of deep vein or superficial thrombosis involving the left lower extremity and right common femoral vein.   Assessment and Plan: I have seen and evaluated the patient as outlined above. I agree with the formulated Assessment and Plan as detailed in the residents' admission note, with the following changes:   1. Atypical CP - no features c/w angina but does have new EKG changes. Therefore, cards doing 2 day exercise stress test. On ASA. No other cardiac meds now.   2. H/O provoked DVT - details outlined above. Pt has had one provoked DVT and evidence of a second DVT / progression when INR was subtherapeutic. A repeat doppler showed no evidence of DVT. She has had trouble keeping her INR therapeutic. Therefore, would rec stopping her warfarin and cont on ASA.  PCP may elect to get d dimer 4 weeks after cessation as an elevated level may indicate higher recurrence risk and may cause PCP and pt to decide to cont warfarin.  3. Hematuria - pt has been told she has had hematuria prior. She is former smoker (stopped one yr ago). Will need outpt /U.   Bartholomew Crews, MD 5/7/20153:31 PM

## 2014-03-26 NOTE — Progress Notes (Signed)
UR completed 

## 2014-03-26 NOTE — Progress Notes (Signed)
Cardiology IP Consult  Reason for Consult: chest pain Referring Physician: ED  HPI: Amanda Christensen is a 53 y.o.female with hx below who presented to the ED because of concern that her right arm had some bruising. She is on coumadin and was worried about bleeding. She also reported some epistaxis. Incidentally, she reported some increase in effort dyspnea and occasional chest pressures that has been progressive. She denies any active sx of angina. Her ECG and troponin in the ED were reassuring. She is being placed in obs for further evaluation and mgmt.   Her CP is very atypical.  Has it nearly constantly. Her ECG shows TWI in the inferior leads - new from last year.   Past Medical History  Diagnosis Date  . Hypertension   . Depression   . Arthritis   . COPD (chronic obstructive pulmonary disease)   . Asthma   . Heart murmur   . Seizures     was on medications  . 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 Hernia repair    . Cesarean section    . Joint replacement      right TKA  . Rotator cuff repair Left     History reviewed. No pertinent family history.  Social History:  reports that she has never smoked. She has quit using smokeless tobacco. She reports that she does not drink alcohol or use illicit drugs.  Allergies:  Allergies  Allergen Reactions  . Penicillins     "Passes out"  . Tomato Hives    From acid in food    Current Facility-Administered Medications  Medication Dose Route Frequency Provider Last Rate Last Dose  . albuterol (PROVENTIL) (2.5 MG/3ML) 0.083% nebulizer solution 2.5 mg  2.5 mg Nebulization Q6H PRN Bartholomew Crews, MD   2.5 mg at 03/26/14 0906  . aspirin EC tablet 325 mg  325 mg Oral Daily Jeralene Huff, MD   325 mg at 03/26/14 0913  . cyclobenzaprine (FLEXERIL) tablet 10 mg  10 mg Oral TID PRN Jeralene Huff, MD   10 mg at 03/26/14 0914   . docusate sodium (COLACE) capsule 100 mg  100 mg Oral Daily Jeralene Huff, MD   100 mg at 03/26/14 0913  . ipratropium-albuterol (DUONEB) 0.5-2.5 (3) MG/3ML nebulizer solution 3 mL  3 mL Nebulization BID Bartholomew Crews, MD      . salsalate (DISALCID) tablet 750 mg  750 mg Oral TID PRN Jeralene Huff, MD   750 mg at 03/26/14 0556  . simvastatin (ZOCOR) tablet 20 mg  20 mg Oral QPM Jeralene Huff, MD   20 mg at 03/25/14 2240  . traZODone (DESYREL) tablet 100 mg  100 mg Oral QHS Joni Reining, DO   100 mg at 03/26/14 0015  . verapamil (CALAN-SR) CR tablet 240 mg  240 mg Oral QHS Bartholomew Crews, MD   240 mg at 03/25/14 2240  . Warfarin - Pharmacist Dosing Inpatient   Does not apply q1800 Rande Lawman Rumbarger, RPH        ROS: A full review of systems is obtained and is negative except as noted in the HPI.  Physical Exam: Blood pressure 95/65, pulse 65, temperature 98.3 F (36.8 C), temperature source Oral, resp. rate 18, height 5' (1.524 m), weight 242 lb 1 oz (109.8 kg), SpO2 95.00%.  GENERAL: no acute distress.  obese NECK: Supple. The thyroid is not enlarged.  LYMPH: There are no masses or lymphadenopathy present.  HEART: Regular rate and rhythm with no m/g/r.  Normal S1/S2. No JVD LUNGS: Clear to auscultation There are no rales, rhonchi, or wheezes.  ABDOMEN: Soft, non-tender, and non-distended with normoactive bowel sounds. EXTREMITIES: No clubbing, cyanosis, or edema.  PULSES: Carotids were +2 and equal bilaterally with no bruits. Femoral pulses were +2 and equal bilaterally. DP/PT pulses were +2 and equal bilaterally.  SKIN: Warm, dry, and intact.  NEUROLOGIC: The patient was oriented to person, place, and time. No overt neurologic deficits were detected.  PSYCH: seems anxious    Results: Results for orders placed during the hospital encounter of 03/25/14 (from the past 24 hour(s))  URINALYSIS, ROUTINE W REFLEX MICROSCOPIC     Status: Abnormal    Collection Time    03/25/14 12:58 PM      Result Value Ref Range   Color, Urine YELLOW  YELLOW   APPearance CLEAR  CLEAR   Specific Gravity, Urine 1.019  1.005 - 1.030   pH 6.5  5.0 - 8.0   Glucose, UA NEGATIVE  NEGATIVE mg/dL   Hgb urine dipstick SMALL (*) NEGATIVE   Bilirubin Urine NEGATIVE  NEGATIVE   Ketones, ur NEGATIVE  NEGATIVE mg/dL   Protein, ur NEGATIVE  NEGATIVE mg/dL   Urobilinogen, UA 1.0  0.0 - 1.0 mg/dL   Nitrite NEGATIVE  NEGATIVE   Leukocytes, UA SMALL (*) NEGATIVE  URINE MICROSCOPIC-ADD ON     Status: Abnormal   Collection Time    03/25/14 12:58 PM      Result Value Ref Range   Squamous Epithelial / LPF FEW (*) RARE   WBC, UA 0-2  <3 WBC/hpf   RBC / HPF 3-6  <3 RBC/hpf   Bacteria, UA FEW (*) RARE  TROPONIN I     Status: None   Collection Time    03/25/14  5:56 PM      Result Value Ref Range   Troponin I <0.30  <0.30 ng/mL  POC OCCULT BLOOD, ED     Status: None   Collection Time    03/25/14  6:33 PM      Result Value Ref Range   Fecal Occult Bld NEGATIVE  NEGATIVE  TROPONIN I     Status: None   Collection Time    03/26/14  1:31 AM      Result Value Ref Range   Troponin I <0.30  <0.30 ng/mL  LIPID PANEL     Status: None   Collection Time    03/26/14  1:31 AM      Result Value Ref Range   Cholesterol 156  0 - 200 mg/dL   Triglycerides 81  <150 mg/dL   HDL 55  >39 mg/dL   Total CHOL/HDL Ratio 2.8     VLDL 16  0 - 40 mg/dL   LDL Cholesterol 85  0 - 99 mg/dL  TSH     Status: None   Collection Time    03/26/14  1:31 AM      Result Value Ref Range   TSH 1.320  0.350 - 4.500 uIU/mL  PROTIME-INR     Status: Abnormal   Collection Time    03/26/14  6:11 AM      Result Value Ref Range   Prothrombin Time 36.2 (*) 11.6 - 15.2 seconds   INR 3.83 (*) 0.00 - 1.49    CXR: reviewed. CT Angio reviewed.  EKG: Sinus rhythm. T wave flattening noted in inferior leads. Similar to historic.   Assessment/Plan:  1. Atypical CP.    The pain is very atyical but  she does have inferior TWI that is new from last year.  Will get a 2 day myoview study. She thinks she can walk.  If not , we will use Lexiscan.  If the myoview is negative, she will not need any cardiology follow up,.   Amanda Christensen 03/26/2014, 10:29 AM

## 2014-03-26 NOTE — Progress Notes (Signed)
ANTICOAGULATION CONSULT NOTE - Follow Up Consult  Pharmacy Consult for Coumadin Indication: DVT (history)  Allergies  Allergen Reactions  . Penicillins     "Passes out"  . Tomato Hives    From acid in food    Patient Measurements: Height: 5' (152.4 cm) Weight: 242 lb 1 oz (109.8 kg) IBW/kg (Calculated) : 45.5  Vital Signs: Temp: 98.3 F (36.8 C) (05/07 0536) Temp src: Oral (05/07 0536) BP: 178/72 mmHg (05/07 1122) Pulse Rate: 65 (05/07 0536)  Labs:  Recent Labs  03/25/14 1022 03/25/14 1756 03/26/14 0131 03/26/14 0611  HGB 13.2  --   --   --   HCT 41.0  --   --   --   PLT 222  --   --   --   LABPROT 35.4*  --   --  36.2*  INR 3.71*  --   --  3.83*  CREATININE 0.88  --   --   --   TROPONINI <0.30 <0.30 <0.30  --     Estimated Creatinine Clearance: 82.1 ml/min (by C-G formula based on Cr of 0.88).   Medications:  Prescriptions prior to admission  Medication Sig Dispense Refill  . albuterol (PROVENTIL HFA;VENTOLIN HFA) 108 (90 BASE) MCG/ACT inhaler Inhale 2 puffs into the lungs every 6 (six) hours as needed for wheezing or shortness of breath.       . cetirizine (ZYRTEC) 10 MG tablet Take 10 mg by mouth at bedtime.       . cyclobenzaprine (FLEXERIL) 10 MG tablet Take 10 mg by mouth 3 (three) times daily as needed for muscle spasms.      . diclofenac sodium (VOLTAREN) 1 % GEL Apply 2 g topically daily as needed (muscle pain).       . salsalate (DISALCID) 750 MG tablet Take 750 mg by mouth 3 (three) times daily as needed (for muscle spasms).       . simvastatin (ZOCOR) 20 MG tablet Take 20 mg by mouth every evening.      Marland Kitchen terconazole (TERAZOL 7) 0.4 % vaginal cream Place 1 applicator vaginally at bedtime.      . traZODone (DESYREL) 100 MG tablet Take 100 mg by mouth at bedtime.       . verapamil (VERELAN PM) 240 MG 24 hr capsule Take 240 mg by mouth at bedtime.       Marland Kitchen warfarin (COUMADIN) 10 MG tablet Take 10 mg by mouth daily.         Assessment: 53 yo F  presented to ED with R arm bruising and atypical CP.  Pt is on Coumadin for hx of recurrent VTE and INR remains elevated.  No bleeding noted.  Noted plans for stress test to evaluate atypical CP.  Goal of Therapy:  INR 2-3 Monitor platelets by anticoagulation protocol: Yes   Plan:  No Coumadin tonight. Continue daily INR  [employer], Pharm.D., BCPS Clinical Pharmacist Pager [phone] 03/26/2014 1:03 PM

## 2014-03-26 NOTE — Progress Notes (Addendum)
Subjective:   VSS.  Trops x 3 negative.  Cards plans for stress test this AM.  Pt reports underlying chest pressure that is there all the time.  She states it is better at times, but worse at times.  It is non-exertional and has been going on for years but she has never told anyone.     Objective:   Vital signs in last 24 hours: Filed Vitals:   03/26/14 1115 03/26/14 1118 03/26/14 1120 03/26/14 1122  BP: 179/90 166/88 190/65 178/72  Pulse:      Temp:      TempSrc:      Resp:      Height:      Weight:      SpO2:       Weight change:  No intake or output data in the 24 hours ending 03/26/14 1359  Physical Exam: Constitutional: Vital signs reviewed.  Patient is in no acute distress and cooperative with exam.   Head: Normocephalic and atraumatic Eyes: PERRL, EOMI, conjunctivae normal, no scleral icterus  Neck: Supple, Trachea midline Cardiovascular: RRR, S1, S2 present, no MRG, DP 2+ b/l, 1+ pitting edema Pulmonary/Chest: normal respiratory effort, CTAB, no wheezes, rales, or rhonchi Abdominal: Soft. +BS, Non-tender, non-distended Musculoskeletal: No joint deformities, erythema, or stiffness Neurological: A&O x3, cranial nerve II-XII are grossly intact, moving all extremities  Skin: Warm, dry and intact. No rash Psychiatric: Normal mood and affect.   Lab Results:  BMP:  Recent Labs Lab 03/25/14 1022  NA 146  K 4.1  CL 107  CO2 27  GLUCOSE 96  BUN 10  CREATININE 0.88  CALCIUM 9.4   Anion Gap: 12  CBC:  Recent Labs Lab 03/25/14 1022  WBC 4.4  NEUTROABS 2.5  HGB 13.2  HCT 41.0  MCV 87.0  PLT 222    Coagulation:  Recent Labs Lab 03/25/14 1022 03/26/14 0611  LABPROT 35.4* 36.2*  INR 3.71* 3.83*    CBG:           No results found for this basename: GLUCAP,  in the last 168 hours         HA1C:      No results found for this basename: HGBA1C,  in the last 168 hours  Lipid Panel:  Recent Labs Lab 03/26/14 0131  CHOL 156  HDL 55   LDLCALC 85  TRIG 81  CHOLHDL 2.8    LFTs: No results found for this basename: AST, ALT, ALKPHOS, BILITOT, PROT, ALBUMIN,  in the last 168 hours  Pancreatic Enzymes: No results found for this basename: LIPASE, AMYLASE,  in the last 168 hours  Lactic Acid/Procalcitonin: No results found for this basename: LATICACIDVEN, PROCALCITON,  in the last 168 hours  Ammonia: No results found for this basename: AMMONIA,  in the last 168 hours  Cardiac Enzymes:  Recent Labs Lab 03/25/14 1022 03/25/14 1756 03/26/14 0131  TROPONINI <0.30 <0.30 <0.30   Lab Results  Component Value Date   TROPONINI <0.30 03/26/2014    EKG: EKG Interpretation  Date/Time:  Wednesday Mar 25 2014 10:12:02 EDT Ventricular Rate:  78 PR Interval:  144 QRS Duration: 78 QT Interval:  380 QTC Calculation: 433 R Axis:   17 Text Interpretation:  Normal sinus rhythm Minimal voltage criteria for LVH, may be normal variant Cannot rule out Anterior infarct , age undetermined T wave abnormality, consider inferior ischemia Abnormal ECG inferior T wave changes new since May 2014 Confirmed by  [facility]  MD, CHRISTOPHER [phone]) on  03/25/2014 12:20:09 PM   BNP:  Recent Labs Lab 03/25/14 1022  PROBNP 95.7    D-Dimer: No results found for this basename: DDIMER,  in the last 168 hours  Urinalysis:  Recent Labs Lab 03/25/14 1258  COLORURINE YELLOW  LABSPEC 1.019  PHURINE 6.5  GLUCOSEU NEGATIVE  HGBUR SMALL*  BILIRUBINUR NEGATIVE  KETONESUR NEGATIVE  PROTEINUR NEGATIVE  UROBILINOGEN 1.0  NITRITE NEGATIVE  LEUKOCYTESUR SMALL*    Micro Results: No results found for this or any previous visit (from the past 240 hour(s)).  Blood Culture:    Component Value Date/Time   SDES URINE, CLEAN CATCH 03/08/2011 0856   SPECREQUEST IMMUNE:NORM UT SYMPT:NEG 03/08/2011 0856   CULT NO GROWTH 03/08/2011 0856   REPTSTATUS 03/09/2011 FINAL 03/08/2011 0856    Studies/Results: Dg Chest 2 View  03/25/2014   CLINICAL DATA:   Chest tightness, coagulation disorder and history of pulmonary embolism.  EXAM: CHEST - 2 VIEW  COMPARISON:  03/27/2013  FINDINGS: The heart size and mediastinal contours are within normal limits. There is no evidence of pulmonary edema, consolidation, pneumothorax, nodule or pleural fluid. The visualized skeletal structures are unremarkable.  IMPRESSION: No active disease.   Electronically Signed   By: Aletta Edouard M.D.   On: 03/25/2014 13:22   Ct Angio Chest Pe W/cm &/or Wo Cm  03/25/2014   CLINICAL DATA:  Chest pain.  Shortness of breath.  EXAM: CT ANGIOGRAPHY CHEST WITH CONTRAST  TECHNIQUE: Multidetector CT imaging of the chest was performed using the standard protocol during bolus administration of intravenous contrast. Multiplanar CT image reconstructions and MIPs were obtained to evaluate the vascular anatomy.  CONTRAST:  164mL OMNIPAQUE IOHEXOL 350 MG/ML SOLN  COMPARISON:  DG CHEST 2 VIEW dated 03/25/2014; CT ANGIO CHEST W/CM &/OR WO/CM dated 03/27/2013; CT ANGIO CHEST W/CM &/OR WO/CM dated 04/16/2011  FINDINGS: Body habitus reduces diagnostic sensitivity and specificity. No filling defect is identified in the pulmonary arterial tree to suggest pulmonary embolus. No acute aortic findings.  3 mm right upper lobe subpleural nodule, image 30 of series 6, stable from 04/16/2011 and considered benign.  Mild right lower lobe scarring noted in the posterior basal segment. 3 mm right lower lobe subpleural nodule, image 59 of series 6, stable from 2012 and considered benign.  Airway thickening is present, suggesting bronchitis or reactive airways disease. Bandlike opacity peripherally in the left upper lobe favors atelectasis.  Severe left degenerative glenohumeral arthropathy for example is shown on image 90 of series 7.  Review of the MIP images confirms the above findings.  IMPRESSION: 1. No embolus or acute aortic findings. Reduced sensitivity due to body habitus. 2. Mild cardiomegaly 3. Prominent degenerative  left glenohumeral arthropathy. 4. Airway thickening is present, suggesting bronchitis or reactive airways disease.   Electronically Signed   By: Sherryl Barters M.D.   On: 03/25/2014 17:31    Medications:  Scheduled Meds: . acetaminophen      . aspirin EC  325 mg Oral Daily  . docusate sodium  100 mg Oral Daily  . ipratropium-albuterol  3 mL Nebulization BID  . regadenoson      . simvastatin  20 mg Oral QPM  . traZODone  100 mg Oral QHS  . verapamil  240 mg Oral QHS  . Warfarin - Pharmacist Dosing Inpatient   Does not apply q1800   Continuous Infusions:  PRN Meds:.acetaminophen, albuterol, ALPRAZolam, cyclobenzaprine, ondansetron (ZOFRAN) IV, salsalate, zolpidem  Antibiotics: Anti-infectives   None     Antibiotics Given (  last 72 hours)   None      Day of Hospitalization:  1 Consults: Treatment Team:  Rounding [facility]iology, MD  Assessment/Plan:   Principal Problem:   Chest tightness or pressure Active Problems:   COPD (chronic obstructive pulmonary disease)   Hypertension   Supratherapeutic INR   Osteoarthritis   History of DVT (deep vein thrombosis)   Chest pain  Chest tightness/pressure  Improved.  Pt reported chest pressure, and nausea in the ED, relieved by albuterol, minimal chest heaviness currently. EKG significant for new inferior TWI, trops x 3 negative. She has a TIMI risk score of 0 (low risk) and a Heart Score of 4 (moderate risk).  ASA given.  Lipid panel, TSH wnl.  HA1c pending.   - Cardiology consulted, stress test this AM - NO BB given history of asthma  - 2D echo given 100 lb weight gain in 1 year (proBNP wnl)   COPD (chronic obstructive pulmonary disease) with Asthma  No active wheezing or SOB; former smoker.   - Continue Duoneb q4   Hypertension  -Continue Verapamil   Supratherapeutic INR  -INR 3.71, Hgb stable, no signs of active bleeding.  -d/c warfarin  ADDENDUM: 03/26/14 Spoke with Kathlene November, FNP at [facility] and  they are OK with stopping coumadin.  Osteoarthritis  -Continue Salsalate TID   History of DVT (deep vein thrombosis)  Supratheraputic on INR unclear at this time if lifetime indication warranted, per chart review patient appears to have had a provoked DVT and second/progression of DVT 2 weeks later when her INR was subtheraputic.  -per above recommendations from [facility] (PCP)  #VTE PPx  On warfarin -d/c warfarin    #Dispo- Disposition is deferred at this time, awaiting improvement of current medical problems.  Anticipated discharge in approximately 1-2 day(s).    LOS: 1 day   Jones Bales, MD PGY-1, Internal Medicine Teaching Service [phone] (7AM-5PM Mon-Fri) 03/26/2014, 1:59 PM

## 2014-03-26 NOTE — ED Provider Notes (Signed)
Medical screening examination/treatment/procedure(s) were conducted as a shared visit with non-physician practitioner(s) and myself.  I personally evaluated the patient during the encounter.   EKG Interpretation   Date/Time:  Wednesday Mar 25 2014 10:12:02 EDT Ventricular Rate:  78 PR Interval:  144 QRS Duration: 78 QT Interval:  380 QTC Calculation: 433 R Axis:   17 Text Interpretation:  Normal sinus rhythm Minimal voltage criteria for  LVH, may be normal variant Cannot rule out Anterior infarct , age  undetermined T wave abnormality, consider inferior ischemia Abnormal ECG  inferior T wave changes new since May 2014 Confirmed by [facility]  MD,  [place] [phone]) on 03/25/2014 12:20:09 PM      Patient presented to the ER for evaluation of nosebleeds and bruising. Patient has a history of DVT after surgery, is on Coumadin. She reports that there has been great difficulty in managing her INR. She is slightly supratherapeutic here in the ER today. Complaining of chest discomfort. Troponins are negative. EKG unremarkable. She did have nonspecific changes. CT chest performed, no evidence of PE. She is currently. She has just had her Coumadin dose used, would continue the reduced dose, follow up at the clinic within 1 week for a check. Return for any further bleeding.  Orpah Greek, MD 03/26/14 [phone]

## 2014-03-27 ENCOUNTER — Inpatient Hospital Stay (HOSPITAL_COMMUNITY): Payer: Medicaid Other

## 2014-03-27 LAB — PROTIME-INR
INR: 2.17 — ABNORMAL HIGH (ref 0.00–1.49)
PROTHROMBIN TIME: 23.5 s — AB (ref 11.6–15.2)

## 2014-03-27 MED ORDER — WARFARIN SODIUM 10 MG PO TABS
10.0000 mg | ORAL_TABLET | Freq: Once | ORAL | Status: DC
  Filled 2014-03-27: qty 1

## 2014-03-27 MED ORDER — TECHNETIUM TC 99M SESTAMIBI GENERIC - CARDIOLITE
30.0000 | Freq: Once | INTRAVENOUS | Status: AC | PRN
  Administered 2014-03-27: 30 via INTRAVENOUS

## 2014-03-27 MED ORDER — TECHNETIUM TC 99M SESTAMIBI GENERIC - CARDIOLITE
10.0000 | Freq: Once | INTRAVENOUS | Status: AC | PRN
  Administered 2014-03-26: 30 via INTRAVENOUS

## 2014-03-27 NOTE — Progress Notes (Signed)
Cardiology IP Consult  Reason for Consult: chest pain Referring Physician: ED  HPI: Amanda Christensen is a 53 y.o.female with hx below who presented to the ED because of concern that her right arm had some bruising. She is on coumadin and was worried about bleeding. She also reported some epistaxis. Incidentally, she reported some increase in effort dyspnea and occasional chest pressures that has been progressive. She denies any active sx of angina. Her ECG and troponin in the ED were reassuring. She is being placed in obs for further evaluation and mgmt.   Her CP is very atypical.  Has it nearly constantly. Her ECG shows TWI in the inferior leads - new from last year.   Past Medical History  Diagnosis Date  . Hypertension   . Depression   . Arthritis   . COPD (chronic obstructive pulmonary disease)   . Asthma   . Heart murmur   . Seizures     was on medications  . 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 Hernia repair    . Cesarean section    . Joint replacement      right TKA  . Rotator cuff repair Left     History reviewed. No pertinent family history.  Social History:  reports that she has never smoked. She has quit using smokeless tobacco. She reports that she does not drink alcohol or use illicit drugs.  Allergies:  Allergies  Allergen Reactions  . Penicillins     "Passes out"  . Tomato Hives    From acid in food    Current Facility-Administered Medications  Medication Dose Route Frequency Provider Last Rate Last Dose  . acetaminophen (TYLENOL) tablet 650 mg  650 mg Oral Q4H PRN Evelene Croon Barrett, PA-C   650 mg at 03/26/14 1126  . albuterol (PROVENTIL) (2.5 MG/3ML) 0.083% nebulizer solution 2.5 mg  2.5 mg Nebulization Q6H PRN Bartholomew Crews, MD   2.5 mg at 03/27/14 0031  . ALPRAZolam Duanne Moron) tablet 0.25 mg  0.25 mg Oral BID PRN Evelene Croon Barrett, PA-C      . aspirin EC tablet  325 mg  325 mg Oral Daily Jeralene Huff, MD   325 mg at 03/26/14 0913  . cyclobenzaprine (FLEXERIL) tablet 10 mg  10 mg Oral TID PRN Jeralene Huff, MD   10 mg at 03/26/14 2206  . docusate sodium (COLACE) capsule 100 mg  100 mg Oral Daily Jeralene Huff, MD   100 mg at 03/26/14 0913  . ipratropium-albuterol (DUONEB) 0.5-2.5 (3) MG/3ML nebulizer solution 3 mL  3 mL Nebulization BID Bartholomew Crews, MD   3 mL at 03/26/14 2051  . ondansetron (ZOFRAN) tablet 4 mg  4 mg Oral Q8H PRN Jones Bales, MD      . salsalate (DISALCID) tablet 750 mg  750 mg Oral TID PRN Jeralene Huff, MD   750 mg at 03/27/14 0535  . simvastatin (ZOCOR) tablet 20 mg  20 mg Oral QPM Jeralene Huff, MD   20 mg at 03/26/14 1755  . traZODone (DESYREL) tablet 100 mg  100 mg Oral QHS Joni Reining, DO   100 mg at 03/26/14 2216  . verapamil (CALAN-SR) CR tablet 240 mg  240 mg Oral QHS Bartholomew Crews, MD   240 mg at 03/26/14 2216  . Warfarin - Pharmacist Dosing Inpatient  Does not apply q1800 Rande Lawman Rumbarger, RPH      . zolpidem (AMBIEN) tablet 5 mg  5 mg Oral QHS PRN Rhonda G Barrett, PA-C        ROS: A full review of systems is obtained and is negative except as noted in the HPI.  Physical Exam: Blood pressure 110/58, pulse 77, temperature 97.8 F (36.6 C), temperature source Oral, resp. rate 20, height 5' (1.524 m), weight 225 lb 8.5 oz (102.3 kg), SpO2 100.00%.  GENERAL: no acute distress. obese NECK: Supple. The thyroid is not enlarged.  LYMPH: There are no masses or lymphadenopathy present.  HEART: Regular rate and rhythm with no m/g/r.  Normal S1/S2. No JVD LUNGS: Clear to auscultation There are no rales, rhonchi, or wheezes.  ABDOMEN: Soft, non-tender, and non-distended with normoactive bowel sounds. EXTREMITIES: No clubbing, cyanosis, or edema.  PULSES: Carotids were +2 and equal bilaterally with no bruits. Femoral pulses were +2 and equal bilaterally.  DP/PT pulses were +2 and equal bilaterally.  SKIN: Warm, dry, and intact.  NEUROLOGIC: The patient was oriented to person, place, and time. No overt neurologic deficits were detected.  PSYCH: seems anxious    Results: Results for orders placed during the hospital encounter of 03/25/14 (from the past 24 hour(s))  PROTIME-INR     Status: Abnormal   Collection Time    03/27/14  5:33 AM      Result Value Ref Range   Prothrombin Time 23.5 (*) 11.6 - 15.2 seconds   INR 2.17 (*) 0.00 - 1.49    CXR: reviewed. CT Angio:  Negative for PE EKG: Sinus rhythm. T wave flattening noted in inferior leads.   Assessment/Plan:  1. Atypical CP.    The pain is very atyical but she does have inferior TWI that is new from last year.   The myoview has been completed.  Results are pending.   If the myoview is negative, she will not need any cardiology follow up and she can be discharged from my standpoint.   Wonda Cheng Nahser 03/27/2014, 9:07 AM

## 2014-03-27 NOTE — Progress Notes (Addendum)
Subjective: She has 4-5/10 chest pressure today in mid chest.  Breathing treatments helped her sx's.  Moving or exertions made her sx's worse.   She denies nausea/vomiting. She was not able to tolerate exercise stress test due to chronic leg issues.    Objective: Vital signs in last 24 hours: Filed Vitals:   03/26/14 1122 03/26/14 1516 03/26/14 2032 03/27/14 0500  BP: 178/72 126/56 117/60 110/58  Pulse:  82 81 77  Temp:  97.8 F (36.6 C) 97.5 F (36.4 C) 97.8 F (36.6 C)  TempSrc:  Oral Oral Oral  Resp:  20 18 20   Height:      Weight:    225 lb 8.5 oz (102.3 kg)  SpO2:  99% 98% 100%   Weight change: -14 lb 7.5 oz (-6.563 kg) No intake or output data in the 24 hours ending 03/27/14 1451 Vitals reviewed. General: resting in bed, NAD HEENT: [place]/at, no scleral icterus Cardiac: RRR, no rubs, murmurs or gallops, reproducible mid chest pain Pulm: clear to auscultation bilaterally, no wheezes, rales, or rhonchi Abd: soft, nontender, nondistended, BS present Ext: warm and well perfused, trace pedal edema Neuro: alert and oriented X3, moving all 4 extremities   Lab Results: Basic Metabolic Panel:  Recent Labs Lab 03/25/14 1022  NA 146  K 4.1  CL 107  CO2 27  GLUCOSE 96  BUN 10  CREATININE 0.88  CALCIUM 9.4   CBC:  Recent Labs Lab 03/25/14 1022  WBC 4.4  NEUTROABS 2.5  HGB 13.2  HCT 41.0  MCV 87.0  PLT 222   Cardiac Enzymes:  Recent Labs Lab 03/25/14 1022 03/25/14 1756 03/26/14 0131  TROPONINI <0.30 <0.30 <0.30   BNP:  Recent Labs Lab 03/25/14 1022  PROBNP 95.7   Hemoglobin A1C:  Recent Labs Lab 03/26/14 0131  HGBA1C 5.8*   Fasting Lipid Panel:  Recent Labs Lab 03/26/14 0131  CHOL 156  HDL 55  LDLCALC 85  TRIG 81  CHOLHDL 2.8   Thyroid Function Tests:  Recent Labs Lab 03/26/14 0131  TSH 1.320   Coagulation:  Recent Labs Lab 03/25/14 1022 03/26/14 0611 03/27/14 0533  LABPROT 35.4* 36.2* 23.5*  INR 3.71* 3.83* 2.17*      Urinalysis:  Recent Labs Lab 03/25/14 1258  COLORURINE YELLOW  LABSPEC 1.019  PHURINE 6.5  GLUCOSEU NEGATIVE  HGBUR SMALL*  BILIRUBINUR NEGATIVE  KETONESUR NEGATIVE  PROTEINUR NEGATIVE  UROBILINOGEN 1.0  NITRITE NEGATIVE  LEUKOCYTESUR SMALL*   Misc. Labs: none  Studies/Results: Ct Angio Chest Pe W/cm &/or Wo Cm  03/25/2014   CLINICAL DATA:  Chest pain.  Shortness of breath.  EXAM: CT ANGIOGRAPHY CHEST WITH CONTRAST  TECHNIQUE: Multidetector CT imaging of the chest was performed using the standard protocol during bolus administration of intravenous contrast. Multiplanar CT image reconstructions and MIPs were obtained to evaluate the vascular anatomy.  CONTRAST:  134mL OMNIPAQUE IOHEXOL 350 MG/ML SOLN  COMPARISON:  DG CHEST 2 VIEW dated 03/25/2014; CT ANGIO CHEST W/CM &/OR WO/CM dated 03/27/2013; CT ANGIO CHEST W/CM &/OR WO/CM dated 04/16/2011  FINDINGS: Body habitus reduces diagnostic sensitivity and specificity. No filling defect is identified in the pulmonary arterial tree to suggest pulmonary embolus. No acute aortic findings.  3 mm right upper lobe subpleural nodule, image 30 of series 6, stable from 04/16/2011 and considered benign.  Mild right lower lobe scarring noted in the posterior basal segment. 3 mm right lower lobe subpleural nodule, image 59 of series 6, stable from 2012 and considered benign.  Airway thickening is present, suggesting bronchitis or reactive airways disease. Bandlike opacity peripherally in the left upper lobe favors atelectasis.  Severe left degenerative glenohumeral arthropathy for example is shown on image 90 of series 7.  Review of the MIP images confirms the above findings.  IMPRESSION: 1. No embolus or acute aortic findings. Reduced sensitivity due to body habitus. 2. Mild cardiomegaly 3. Prominent degenerative left glenohumeral arthropathy. 4. Airway thickening is present, suggesting bronchitis or reactive airways disease.   Electronically Signed   By: Sherryl Barters M.D.   On: 03/25/2014 17:31   Nm Myocar Multi W/spect W/wall Motion / Ef  03/27/2014   CLINICAL DATA:  Chest pain  EXAM: MYOCARDIAL IMAGING WITH SPECT (REST AND PHARMACOLOGIC-STRESS)  GATED LEFT VENTRICULAR WALL MOTION STUDY  LEFT VENTRICULAR EJECTION FRACTION  TECHNIQUE: Standard myocardial SPECT imaging was performed after resting intravenous injection of 30 mCi Tc-52m sestamibi. Subsequently, intravenous infusion of Lexiscan was performed under the supervision of the Cardiology staff. At peak effect of the drug, 30 mCi Tc-44m sestamibi was injected intravenously and standard myocardial SPECT imaging was performed. Quantitative gated imaging was also performed to evaluate left ventricular wall motion, and estimate left ventricular ejection fraction. The patient was unable to raise her arms during imaging, due to multiple sclerosis.  COMPARISON:  03/25/2014 CT scan  FINDINGS: Myocardial perfusion study: Reduced activity along the inferior wall on stress than rest images is attributed to diaphragmatic attenuation and less likely to represent scar eighth. There is mild apical thinning. No inducible ischemia. .  Ejection fraction calculation: End-diastolic volume is seventy-six mL. End systolic volume is 20 mL. Derived left ventricular ejection fraction of 74%.  Wall motion analysis: Left ventricular wall motion and wall thickening appear normal.  IMPRESSION: 1. Reduced activity along the inferior wall on stress and rest images, diaphragmatic attenuation is favored over RCA distribution scar. Left ventricular rest ejection fraction is calculated at 74%.   Electronically Signed   By: Sherryl Barters M.D.   On: 03/27/2014 11:06   Medications: Scheduled Meds: . aspirin EC  325 mg Oral Daily  . docusate sodium  100 mg Oral Daily  . ipratropium-albuterol  3 mL Nebulization BID  . simvastatin  20 mg Oral QPM  . traZODone  100 mg Oral QHS  . verapamil  240 mg Oral QHS  . warfarin  10 mg Oral  ONCE-1800  . Warfarin - Pharmacist Dosing Inpatient   Does not apply q1800   Continuous Infusions:  PRN Meds:.acetaminophen, albuterol, ALPRAZolam, cyclobenzaprine, ondansetron, salsalate, zolpidem  Assessment/Plan: 53 y.o PMH HTN, Depression, arthritis, COPD/asthma, heart murmur, seizures, right lower ext DVT s/p right total knee replacement. She presented to the ED for multiple somatic complaints and then complained of chest pain.   #Chest tightness or pressure/Atypical Chest pain -Likely noncardiac (i.e anxiety, related to asthma/COPD w/o resp. Distress this admission) but she did have inferior T wave inversions on EKG which is new from prior EKG. Cardiology consulted  -Myoview negative, consider outpatient echo if indicated -repeat EKG outpatient  -will have patient f/u with PCP Dr. Lindwood Qua and cardiology   #COPD (chronic obstructive pulmonary disease)/asthma -Continue prn Albuterol   #Hypertension -Continue Verapamil qhs home dose.    #History of DVT (deep vein thrombosis)/Supratherapeutic INR -Stop Coumadin no DVT on repeat US 03/2012  -PCP please re-evaluate need for Coumadin -will repeat D dimer in 4 weeks with PCP  #F/E/N -NSL -cardiac diet   #DVT px  -She was getting Coumadin this admission  with INR therapeutic except on 03/27/14. Will d/c on discharge     Dispo: D/c today  The patient does have a current PCP Ollen Gross Colette Ribas, MD) and does need an [facility] hospital follow-up appointment after discharge.  The patient does not have transportation limitations that hinder transportation to clinic appointments.  Services Needed at time of discharge: Y = Yes, Blank = No PT:   OT:   RN:   Equipment:   Other:     LOS: 2 days   Cresenciano Genre, MD [phone] 03/27/2014, 2:51 PM

## 2014-03-27 NOTE — Discharge Instructions (Addendum)
Chest Pain (Nonspecific) °It is often hard to give a specific diagnosis for the cause of chest pain. There is always a chance that your pain could be related to something serious, such as a heart attack or a blood clot in the lungs. You need to follow up with your caregiver for further evaluation. °CAUSES  °· Heartburn. °· Pneumonia or bronchitis. °· Anxiety or stress. °· Inflammation around your heart (pericarditis) or lung (pleuritis or pleurisy). °· A blood clot in the lung. °· A collapsed lung (pneumothorax). It can develop suddenly on its own (spontaneous pneumothorax) or from injury (trauma) to the chest. °· Shingles infection (herpes zoster virus). °The chest wall is composed of bones, muscles, and cartilage. Any of these can be the source of the pain. °· The bones can be bruised by injury. °· The muscles or cartilage can be strained by coughing or overwork. °· The cartilage can be affected by inflammation and become sore (costochondritis). °DIAGNOSIS  °Lab tests or other studies, such as X-rays, electrocardiography, stress testing, or cardiac imaging, may be needed to find the cause of your pain.  °TREATMENT  °· Treatment depends on what may be causing your chest pain. Treatment may include: °· Acid blockers for heartburn. °· Anti-inflammatory medicine. °· Pain medicine for inflammatory conditions. °· Antibiotics if an infection is present. °· You may be advised to change lifestyle habits. This includes stopping smoking and avoiding alcohol, caffeine, and chocolate. °· You may be advised to keep your head raised (elevated) when sleeping. This reduces the chance of acid going backward from your stomach into your esophagus. °· Most of the time, nonspecific chest pain will improve within 2 to 3 days with rest and mild pain medicine. °HOME CARE INSTRUCTIONS  °· If antibiotics were prescribed, take your antibiotics as directed. Finish them even if you start to feel better. °· For the next few days, avoid physical  activities that bring on chest pain. Continue physical activities as directed. °· Do not smoke. °· Avoid drinking alcohol. °· Only take over-the-counter or prescription medicine for pain, discomfort, or fever as directed by your caregiver. °· Follow your caregiver's suggestions for further testing if your chest pain does not go away. °· Keep any follow-up appointments you made. If you do not go to an appointment, you could develop lasting (chronic) problems with pain. If there is any problem keeping an appointment, you must call to reschedule. °SEEK MEDICAL CARE IF:  °· You think you are having problems from the medicine you are taking. Read your medicine instructions carefully. °· Your chest pain does not go away, even after treatment. °· You develop a rash with blisters on your chest. °SEEK IMMEDIATE MEDICAL CARE IF:  °· You have increased chest pain or pain that spreads to your arm, neck, jaw, back, or abdomen. °· You develop shortness of breath, an increasing cough, or you are coughing up blood. °· You have severe back or abdominal pain, feel nauseous, or vomit. °· You develop severe weakness, fainting, or chills. °· You have a fever. °THIS IS AN EMERGENCY. Do not wait to see if the pain will go away. Get medical help at once. Call your local emergency services (911 in [place] Do not drive yourself to the hospital. °MAKE SURE YOU:  °· Understand these instructions. °· Will watch your condition. °· Will get help right away if you are not doing well or get worse. °Document Released: 08/16/2005 Document Revised: 01/29/2012 Document Reviewed: 06/11/2008 °[employer]® Patient Information ©2014 [employer]. Cardiac Diet This diet can help prevent heart disease and stroke. Many factors influence your heart health, including eating and exercise habits. Coronary risk rises a lot with abnormal blood fat (lipid) levels. Cardiac meal planning includes limiting unhealthy fats, increasing healthy fats, and making other small  dietary changes. General guidelines are as follows: Adjust calorie intake to reach and maintain desirable body weight. Limit total fat intake to less than 30% of total calories. Saturated fat should be less than 7% of calories. Saturated fats are found in animal products and in some vegetable products. Saturated vegetable fats are found in coconut oil, cocoa butter, palm oil, and palm kernel oil. Read labels carefully to avoid these products as much as possible. Use butter in moderation. Choose tub margarines and oils that have 2 grams of fat or less. Good cooking oils are canola and olive oils. Practice low-fat cooking techniques. Do not fry food. Instead, broil, bake, boil, steam, grill, roast on a rack, stir-fry, or microwave it. Other fat reducing suggestions include: Remove the skin from poultry. Remove all visible fat from meats. Skim the fat off stews, soups, and gravies before serving them. Steam vegetables in water or broth instead of sauting them in fat. Avoid foods with trans fat (or hydrogenated oils), such as commercially fried foods and commercially baked goods. Commercial shortening and deep-frying fats will contain trans fat. Increase intake of fruits, vegetables, whole grains, and legumes to replace foods high in fat. Increase consumption of nuts, legumes, and seeds to at least 4 servings weekly. One serving of a legume equals  cup, and 1 serving of nuts or seeds equals  cup. Choose whole grains more often. Have 3 servings per day (a serving is 1 ounce [oz]). Eat 4 to 5 servings of vegetables per day. A serving of vegetables is 1 cup of raw leafy vegetables;  cup of raw or cooked cut-up vegetables;  cup of vegetable juice. Eat 4 to 5 servings of fruit per day. A serving of fruit is 1 medium whole fruit;  cup of dried fruit;  cup of fresh, frozen, or canned fruit;  cup of 100% fruit juice. Increase your intake of dietary fiber to 20 to 30 grams per day. Insoluble fiber may help  lower your risk of heart disease and may help curb your appetite.  Soluble fiber binds cholesterol to be removed from the blood. Foods high in soluble fiber are dried beans, citrus fruits, oats, apples, bananas, broccoli, Brussels sprouts, and eggplant. Try to include foods fortified with plant sterols or stanols, such as yogurt, breads, juices, or margarines. Choose several fortified foods to achieve a daily intake of 2 to 3 grams of plant sterols or stanols. Foods with omega-3 fats can help reduce your risk of heart disease. Aim to have a 3.5 oz portion of fatty fish twice per week, such as salmon, mackerel, albacore tuna, sardines, lake trout, or herring. If you wish to take a fish oil supplement, choose one that contains 1 gram of both DHA and EPA. Limit processed meats to 2 servings (3 oz portion) weekly. Limit the sodium in your diet to 1500 milligrams (mg) per day. If you have high blood pressure, talk to a registered dietitian about a DASH (Dietary Approaches to Stop Hypertension) eating plan. Limit sweets and beverages with added sugar, such as soda, to no more than 5 servings per week. One serving is:  1 tablespoon sugar. 1 tablespoon jelly or jam.  cup sorbet. 1 cup lemonade.  cup regular soda. CHOOSING FOODS Starches Allowed: Breads: All kinds (wheat, rye, raisin, white, oatmeal, [place], [place], and English muffin bread). Low-fat rolls: English muffins, frankfurter and hamburger buns, bagels, pita bread, tortillas (not fried). Pancakes, waffles, biscuits, and muffins made with recommended oil. Avoid: Products made with saturated or trans fats, oils, or whole milk products. Butter rolls, cheese breads, croissants. Commercial doughnuts, muffins, sweet rolls, biscuits, waffles, pancakes, store-bought mixes. Crackers Allowed: Low-fat crackers and snacks: Animal, graham, rye, saltine (with recommended oil, no lard), oyster, and matzo crackers. Bread sticks, melba toast, rusks, flatbread,  pretzels, and light popcorn. Avoid: High-fat crackers: cheese crackers, butter crackers, and those made with coconut, palm oil, or trans fat (hydrogenated oils). Buttered popcorn. Cereals Allowed: Hot or cold whole-grain cereals. Avoid: Cereals containing coconut, hydrogenated vegetable fat, or animal fat. Potatoes / Pasta / Rice Allowed: All kinds of potatoes, rice, and pasta (such as macaroni, spaghetti, and noodles). Avoid: Pasta or rice prepared with cream sauce or high-fat cheese. Chow mein noodles, [place] fries. Vegetables Allowed: All vegetables and vegetable juices. Avoid: Fried vegetables. Vegetables in cream, butter, or high-fat cheese sauces. Limit coconut. Fruit in cream or custard. Protein Allowed: Limit your intake of meat, seafood, and poultry to no more than 6 oz (cooked weight) per day. All lean, well-trimmed beef, veal, pork, and lamb. All chicken and [place] without skin. All fish and shellfish. Wild game: wild duck, rabbit, pheasant, and venison. Egg whites or low-cholesterol egg substitutes may be used as desired. Meatless dishes: recipes with dried beans, peas, lentils, and tofu (soybean curd). Seeds and nuts: all seeds and most nuts. Avoid: Prime grade and other heavily marbled and fatty meats, such as short ribs, spare ribs, rib eye roast or steak, frankfurters, sausage, bacon, and high-fat luncheon meats, mutton. Caviar. Commercially fried fish. Domestic duck, goose, venison sausage. Organ meats: liver, gizzard, heart, chitterlings, brains, kidney, sweetbreads. Dairy Allowed: Low-fat cheeses: nonfat or low-fat cottage cheese (1% or 2% fat), cheeses made with part skim milk, such as mozzarella, farmers, string, or ricotta. (Cheeses should be labeled no more than 2 to 6 grams fat per oz.). Skim (or 1%) milk: liquid, powdered, or evaporated. Buttermilk made with low-fat milk. Drinks made with skim or low-fat milk or cocoa. Chocolate milk or cocoa made with skim or low-fat (1%)  milk. Nonfat or low-fat yogurt. Avoid: Whole milk cheeses, including colby, cheddar, [place], Monterey Jack, [place], [place], [place], American, Swiss, and blue. Creamed cottage cheese, cream cheese. Whole milk and whole milk products, including buttermilk or yogurt made from whole milk, drinks made from whole milk. Condensed milk, evaporated whole milk, and 2% milk. Soups and Combination Foods Allowed: Low-fat low-sodium soups: broth, dehydrated soups, homemade broth, soups with the fat removed, homemade cream soups made with skim or low-fat milk. Low-fat spaghetti, lasagna, chili, and Spanish rice if low-fat ingredients and low-fat cooking techniques are used. Avoid: Cream soups made with whole milk, cream, or high-fat cheese. All other soups. Desserts and Sweets Allowed: Sherbet, fruit ices, gelatins, meringues, and angel food cake. Homemade desserts with recommended fats, oils, and milk products. Jam, jelly, honey, marmalade, sugars, and syrups. Pure sugar candy, such as gum drops, hard candy, jelly beans, marshmallows, mints, and small amounts of dark chocolate. Avoid: Commercially prepared cakes, pies, cookies, frosting, pudding, or mixes for these products. Desserts containing whole milk products, chocolate, coconut, lard, palm oil, or palm kernel oil. Ice cream or ice cream drinks. Candy that contains chocolate, coconut, butter, hydrogenated fat, or unknown ingredients.  Buttered syrups. Fats and Oils Allowed: Vegetable oils: safflower, sunflower, corn, soybean, cottonseed, sesame, canola, olive, or peanut. Non-hydrogenated margarines. Salad dressing or mayonnaise: homemade or commercial, made with a recommended oil. Low or nonfat salad dressing or mayonnaise. Limit added fats and oils to 6 to 8 tsp per day (includes fats used in cooking, baking, salads, and spreads on bread). Remember to count the "hidden fats" in foods. Avoid: Solid fats and shortenings: butter, lard, salt pork, bacon drippings.  Gravy containing meat fat, shortening, or suet. Cocoa butter, coconut. Coconut oil, palm oil, palm kernel oil, or hydrogenated oils: these ingredients are often used in bakery products, nondairy creamers, whipped toppings, candy, and commercially fried foods. Read labels carefully. Salad dressings made of unknown oils, sour cream, or cheese, such as blue cheese and Roquefort. Cream, all kinds: half-and-half, light, heavy, or whipping. Sour cream or cream cheese (even if "light" or low-fat). Nondairy cream substitutes: coffee creamers and sour cream substitutes made with palm, palm kernel, hydrogenated oils, or coconut oil. Beverages Allowed: Coffee (regular or decaffeinated), tea. Diet carbonated beverages, mineral water. Alcohol: Check with your caregiver. Moderation is recommended. Avoid: Whole milk, regular sodas, and juice drinks with added sugar. Condiments Allowed: All seasonings and condiments. Cocoa powder. "Cream" sauces made with recommended ingredients. Avoid: Carob powder made with hydrogenated fats. SAMPLE MENU Breakfast  cup orange juice  cup oatmeal 1 slice toast 1 tsp margarine 1 cup skim milk Lunch [place] sandwich with 2 oz [place], 2 slices bread Lettuce and tomato slices Fresh fruit Carrot sticks Coffee or tea Snack Fresh fruit or low-fat crackers Dinner 3 oz lean ground beef 1 baked potato 1 tsp margarine  cup asparagus Lettuce salad 1 tbs non-creamy dressing  cup peach slices 1 cup skim milk Document Released: 08/15/2008 Document Revised: 05/07/2012 Document Reviewed: 01/30/2012 [employer] Patient Information 2014 [place], [place].

## 2014-03-27 NOTE — Discharge Summary (Signed)
Name: Amanda Christensen MRN: [id number] DOB: Feb 11, 1961 53 y.o. PCP: Elizabeth Palau, MD  Date of Admission: 03/25/2014 11:06 AM Date of Discharge: 03/27/2014 Attending Physician: Bartholomew Crews, MD  Discharge Diagnosis: 1. Chest tightness or pressure/Atypical Chest pain  2. COPD (chronic obstructive pulmonary disease)/asthma  3. Hypertension  4. History of DVT (deep vein thrombosis)/Supratherapeutic INR   Discharge Medications:   Medication List    STOP taking these medications       warfarin 10 MG tablet  Commonly known as:  COUMADIN      TAKE these medications       albuterol 108 (90 BASE) MCG/ACT inhaler  Commonly known as:  PROVENTIL HFA;VENTOLIN HFA  Inhale 2 puffs into the lungs every 6 (six) hours as needed for wheezing or shortness of breath.     cetirizine 10 MG tablet  Commonly known as:  ZYRTEC  Take 10 mg by mouth at bedtime.     cyclobenzaprine 10 MG tablet  Commonly known as:  FLEXERIL  Take 10 mg by mouth 3 (three) times daily as needed for muscle spasms.     diclofenac sodium 1 % Gel  Commonly known as:  VOLTAREN  Apply 2 g topically daily as needed (muscle pain).     salsalate 750 MG tablet  Commonly known as:  DISALCID  Take 750 mg by mouth 3 (three) times daily as needed (for muscle spasms).     simvastatin 20 MG tablet  Commonly known as:  ZOCOR  Take 20 mg by mouth every evening.     terconazole 0.4 % vaginal cream  Commonly known as:  TERAZOL 7  Place 1 applicator vaginally at bedtime.     traZODone 100 MG tablet  Commonly known as:  DESYREL  Take 100 mg by mouth at bedtime.     verapamil 240 MG 24 hr capsule  Commonly known as:  VERELAN PM  Take 240 mg by mouth at bedtime.        Disposition and follow-up:   Ms.Amanda Christensen was discharged from [facility] in stable condition.  At the hospital follow up visit please address:  1.   -Repeat D dimer in 4 weeks. Reevaluate need for Coumadin -Consider  outpatient echo -repeat EKG outpatient (She had T wave inversion (inferiorly) which is new)  2.  Labs / imaging needed at time of follow-up: see above    3.  Pending labs/ test needing follow-up:  -none  Follow-up Appointments: Follow-up Information   Follow up with Dorothy Spark, MD. (04/29/14 11:00 appt )    Specialty:  Cardiology   Contact information:   [place] [phone]       Follow up with Elizabeth Palau, MD. (Please call your PCP and schedule follow up within 1 week [phone]. We tried to call but no answer )    Specialty:  Family Medicine   Contact information:   [place] [address] [phone]       Discharge Instructions:  Future Appointments Provider Department Dept Phone   04/29/2014 11:00 AM Dorothy Spark, MD [place] Office [phone]      Consultations: Treatment Team:  Rounding Lbcardiology, MD  Procedures Performed:  Dg Chest 2 View  03/25/2014   CLINICAL DATA:  Chest tightness, coagulation disorder and history of pulmonary embolism.  EXAM: CHEST - 2 VIEW  COMPARISON:  03/27/2013  FINDINGS: The heart size and mediastinal contours are within normal limits. There is no evidence of pulmonary edema, consolidation, pneumothorax, nodule or pleural fluid. The visualized skeletal structures are unremarkable.  IMPRESSION: No active disease.   Electronically Signed   By: Aletta Edouard M.D.   On: 03/25/2014 13:22   Ct Angio Chest Pe W/cm &/or Wo Cm  03/25/2014   CLINICAL DATA:  Chest pain.  Shortness of breath.  EXAM: CT ANGIOGRAPHY CHEST WITH CONTRAST  TECHNIQUE: Multidetector CT imaging of the chest was performed using the standard protocol during bolus administration of intravenous contrast. Multiplanar CT image reconstructions and MIPs were obtained to evaluate the vascular anatomy.  CONTRAST:  133mL OMNIPAQUE IOHEXOL 350 MG/ML SOLN  COMPARISON:  DG  CHEST 2 VIEW dated 03/25/2014; CT ANGIO CHEST W/CM &/OR WO/CM dated 03/27/2013; CT ANGIO CHEST W/CM &/OR WO/CM dated 04/16/2011  FINDINGS: Body habitus reduces diagnostic sensitivity and specificity. No filling defect is identified in the pulmonary arterial tree to suggest pulmonary embolus. No acute aortic findings.  3 mm right upper lobe subpleural nodule, image 30 of series 6, stable from 04/16/2011 and considered benign.  Mild right lower lobe scarring noted in the posterior basal segment. 3 mm right lower lobe subpleural nodule, image 59 of series 6, stable from 2012 and considered benign.  Airway thickening is present, suggesting bronchitis or reactive airways disease. Bandlike opacity peripherally in the left upper lobe favors atelectasis.  Severe left degenerative glenohumeral arthropathy for example is shown on image 90 of series 7.  Review of the MIP images confirms the above findings.  IMPRESSION: 1. No embolus or acute aortic findings. Reduced sensitivity due to body habitus. 2. Mild cardiomegaly 3. Prominent degenerative left glenohumeral arthropathy. 4. Airway thickening is present, suggesting bronchitis or reactive airways disease.   Electronically Signed   By: Sherryl Barters M.D.   On: 03/25/2014 17:31   Nm Myocar Multi W/spect W/wall Motion / Ef  03/27/2014   CLINICAL DATA:  Chest pain  EXAM: MYOCARDIAL IMAGING WITH SPECT (REST AND PHARMACOLOGIC-STRESS)  GATED LEFT VENTRICULAR WALL MOTION STUDY  LEFT VENTRICULAR EJECTION FRACTION  TECHNIQUE: Standard myocardial SPECT imaging was performed after resting intravenous injection of 30 mCi Tc-2m sestamibi. Subsequently, intravenous infusion of Lexiscan was performed under the supervision of the Cardiology staff. At peak effect of the drug, 30 mCi Tc-74m sestamibi was injected intravenously and standard myocardial SPECT imaging was performed. Quantitative gated imaging was also performed to evaluate left ventricular wall motion, and estimate left  ventricular ejection fraction. The patient was unable to raise her arms during imaging, due to multiple sclerosis.  COMPARISON:  03/25/2014 CT scan  FINDINGS: Myocardial perfusion study: Reduced activity along the inferior wall on stress than rest images is attributed to diaphragmatic attenuation and less likely to represent scar eighth. There is mild apical thinning. No inducible ischemia. .  Ejection fraction calculation: End-diastolic volume is seventy-six mL. End systolic volume is 20 mL. Derived left ventricular ejection fraction of 74%.  Wall motion analysis: Left ventricular wall motion and wall thickening appear normal.  IMPRESSION: 1. Reduced activity along the inferior wall on stress and rest images, diaphragmatic attenuation is favored over RCA distribution scar. Left ventricular rest ejection fraction is calculated at 74%.   Electronically Signed   By: Sherryl Barters M.D.   On: 03/27/2014 11:06   Mm Digital Screening Bilateral  03/12/2014   CLINICAL DATA:  Screening.  EXAM: DIGITAL SCREENING BILATERAL MAMMOGRAM WITH CAD  COMPARISON:  Previous exam(s)  ACR Breast Density Category a: The breast tissue is almost entirely fatty.  FINDINGS: There are no findings suspicious for malignancy. Images were processed with CAD.  IMPRESSION: No mammographic evidence of malignancy. A result letter of this screening mammogram will be mailed directly to the patient.  RECOMMENDATION: Screening mammogram in one year. (Code:SM-B-01Y)  BI-RADS CATEGORY  1: Negative.   Electronically Signed   By: Lovey Newcomer M.D.   On: 03/12/2014 16:32     Admission HPI:  Chief Complaint: bruise on right arm  History of Present Illness: Amanda Christensen is a 53 yo AA female with PMH of HTN, COPD w/ Asthma, Recurrent DVT on coumadin, OA with right knee replacement in 2012. She presents to [facility] today after her daughter noticed a bruise on the posterior aspect of her right arm. She also reports that for the last three days she has  noticed blood clots when she blows her nose. She reports her INR is checked monthly and required frequent adjustments, she was concerned she was again supratheraputic and came to the ED for evaluation. She denies any blood per rectum, hematuria, hematemsis, or other bleeding. In the ED she apparently reported some shortness of breath and chest tightness, along with a 1 day history of nausea and light headedness. She reports that she frequently has pain in her back and chest which she describes as "muscle spasms", she did have some chest discomfort earlier today that was relieved with albuterol nebulizer in the ED, although she does still have some mild chest heaviness that is unchanged from baseline. Her SOB did improve with the nebulizer and denies any current SOB. She denies any history of chest pain or pressure and reports she can walk a flight of stairs without chest discomfort at home.  She is a former smoker>> 20 pack year quit 1 year ago.  Review of Systems:  Review of Systems  Constitutional: Negative for fever, chills, weight loss and malaise/fatigue.  HENT: Positive for nosebleeds. Negative for sore throat.  Eyes: Negative for blurred vision (admits gradual age related visual loss over many years).  Respiratory: Positive for shortness of breath and wheezing. Negative for cough.  Cardiovascular: Positive for chest pain (described as chest heaviness). Negative for palpitations, orthopnea and leg swelling.  Gastrointestinal: Positive for constipation (for last 2 weeks, last BM yesterday>>hard). Negative for heartburn, vomiting, abdominal pain, diarrhea, blood in stool and melena.  Genitourinary: Negative for dysuria, urgency, frequency and hematuria.  Musculoskeletal: Positive for back pain and joint pain (all over). Negative for falls.  Skin: Negative for rash.  Neurological: Negative for dizziness, sensory change and weakness.   Physical Exam:  Blood pressure 122/84, pulse 90, temperature  97.8 F (36.6 C), temperature source Oral, resp. rate 19, weight 240 lb (108.863 kg), SpO2 100.00%.  Physical Exam  Nursing note and vitals reviewed.  Constitutional: She is oriented to person, place, and time and well-developed, well-nourished, and in no distress.  HENT:  Head: Normocephalic and atraumatic.  Eyes: Conjunctivae are normal. Pupils are equal, round, and reactive to light.  Cardiovascular: Normal rate, regular rhythm, normal heart sounds and intact distal pulses.  No murmur heard.  Pulmonary/Chest: Effort normal and breath sounds normal. No respiratory distress. She has no wheezes. She has no rales. She exhibits no tenderness.  Abdominal: Soft. Bowel sounds are normal. She exhibits no distension. There is tenderness (mild RLQ). There is no rebound and no guarding.  Musculoskeletal: She exhibits edema (trace pedal edema).  Neurological: She is  alert and oriented to person, place, and time.  Skin: Skin is warm and dry.    Hospital Course by problem list: 1. Chest tightness or pressure/Atypical Chest pain  2. COPD (chronic obstructive pulmonary disease)/asthma  3. Hypertension  4. History of DVT (deep vein thrombosis)/Supratherapeutic INR    53 y.o PMH HTN, Depression, arthritis, COPD/asthma, heart murmur, seizures, right lower ext DVT s/p right total knee replacement. She presented with multiple somatic complaints and chest pain, nausea, dyspnea, lightheaded in the ED that had further work up.    1. Chest tightness or pressure/Atypical Chest pain  Likely noncardiac (i.e anxiety, related to asthma/COPD w/o respiratory distress this admission) but she did have inferior T wave inversions on EKG which is new from prior EKG. Cardiology consulted. Myoview negative. Consider outpatient echo if indicated and repeat EKG outpatient.  She will have outpatient follow up with PCP Dr. Lindwood Qua and cardiology.   2. COPD (chronic obstructive pulmonary disease)/asthma  Stable. Continue  prn Albuterol   3. Hypertension  Continue Verapamil qhs home dose   4. History of DVT (deep vein thrombosis)/Supratherapeutic INR  Per chart review:  She has a history of R TKR in March 06, 2011. Complicated by DVT. 03/11/12 : dopplers negative for DVT 03/13/2011 D/C to SNF.  At some point was dx with DVT - date and findings unknown.  Admitted 04/16/11 : She had a repeat imaging done that showed age indeterminate DVT in the right posterior tibial and popliteal veins and acute occlusive DVT throughout the femoral veins and nonocclusive acute DVT in the common femoral vein. (original report available in EMR) All this was on the right. At this point, her INR was noted to be subtherapeutic at 1.79. 04/2011 : returned to OR for visualization in OR of knee 2/2 pt issues 03/24/2012 : No evidence of deep vein or superficial thrombosis involving the left lower extremity and right common femoral vein.  We stopped Coumadin (though she was taking it this admission) because she did not have DVT on repeat US 03/2012. PCP please re-evaluate need for Coumadin.  She will repeat D dimer in 4 weeks with PCP   DVT px  She was getting Coumadin this admission with INR therapeutic except on 03/27/14. Will d/c on discharge    Discharge Vitals:   BP 110/58  Pulse 77  Temp(Src) 97.8 F (36.6 C) (Oral)  Resp 20  Ht 5' (1.524 m)  Wt 225 lb 8.5 oz (102.3 kg)  BMI 44.05 kg/m2  SpO2 100%  Discharge physical exam: General: resting in bed, NAD  HEENT: [place]/at, no scleral icterus  Cardiac: RRR, no rubs, murmurs or gallops, reproducible mid chest pain  Pulm: clear to auscultation bilaterally, no wheezes, rales, or rhonchi  Abd: soft, nontender, nondistended, BS present  Ext: warm and well perfused, trace pedal edema  Neuro: alert and oriented X3, moving all 4 extremities   Discharge Labs:  Results for orders placed during the hospital encounter of 03/25/14 (from the past 24 hour(s))  PROTIME-INR     Status: Abnormal    Collection Time    03/27/14  5:33 AM      Result Value Ref Range   Prothrombin Time 23.5 (*) 11.6 - 15.2 seconds   INR 2.17 (*) 0.00 - 1.49   Results for EPIFANIA, CUCINOTTA (MRN [id number]) as of 03/27/2014 14:57  Ref. Range 03/27/2013 15:04 03/25/2014 10:22 03/26/2014 01:31  Sodium Latest Range: 137-147 mEq/L 144 146   Potassium Latest Range: 3.7-5.3 mEq/L 3.7  4.1   Chloride Latest Range: 96-112 mEq/L 104 107   CO2 Latest Range: 19-32 mEq/L 31 27   Mean Plasma Glucose Latest Range: <117 mg/dL   120 (H)  BUN Latest Range: 6-23 mg/dL 9 10   Creatinine Latest Range: 0.50-1.10 mg/dL 0.97 0.88   Calcium Latest Range: 8.4-10.5 mg/dL 9.9 9.4   GFR calc non Af Amer Latest Range: >90 mL/min 66 (L) 73 (L)   GFR calc Af Amer Latest Range: >90 mL/min 76 (L) 85 (L)   Glucose Latest Range: 70-99 mg/dL 112 (H) 96    Results for YAMILEZ, STANTON (MRN [id number]) as of 03/27/2014 14:57  Ref. Range 03/27/2013 14:37 03/27/2013 15:14 03/25/2014 10:22 03/25/2014 17:56 03/26/2014 01:31  Troponin I Latest Range: <0.30 ng/mL   <0.30 <0.30 <0.30  Troponin i, poc Latest Range: 0.00-0.08 ng/mL  0.01     Pro B Natriuretic peptide (BNP) Latest Range: 0-125 pg/mL 17.4  95.7     Results for TRINITI, DENI (MRN [id number]) as of 03/27/2014 14:57  Ref. Range 03/26/2014 01:31  Cholesterol Latest Range: 0-200 mg/dL 156  Triglycerides Latest Range: <150 mg/dL 81  HDL Latest Range: >39 mg/dL 55  LDL (calc) Latest Range: 0-99 mg/dL 85  VLDL Latest Range: 0-40 mg/dL 16  Total CHOL/HDL Ratio No range found 2.8   Results for CAYTLYN, KEDDIE (MRN [id number]) as of 03/27/2014 14:57  Ref. Range 03/28/2010 20:42  CRP Latest Range: <0.6 mg/dL 0.4   Results for BRANIYAH, OLIVERIA (MRN [id number]) as of 03/27/2014 14:57  Ref. Range 03/27/2013 15:04 03/25/2014 10:22  WBC Latest Range: 4.0-10.5 K/uL 4.5 4.4  RBC Latest Range: 3.87-5.11 MIL/uL 5.10 4.71  Hemoglobin Latest Range: 12.0-15.0 g/dL 14.3 13.2  HCT Latest Range: 36.0-46.0 % 44.3 41.0  MCV Latest Range:  78.0-100.0 fL 86.9 87.0  MCH Latest Range: 26.0-34.0 pg 28.0 28.0  MCHC Latest Range: 30.0-36.0 g/dL 32.3 32.2  RDW Latest Range: 11.5-15.5 % 14.0 15.1  Platelets Latest Range: 150-400 K/uL 220 222  Neutrophils Relative % Latest Range: 43-77 %  56  Lymphocytes Relative Latest Range: 12-46 %  30  Monocytes Relative Latest Range: 3-12 %  12  Eosinophils Relative Latest Range: 0-5 %  1  Basophils Relative Latest Range: 0-1 %  1  NEUT# Latest Range: 1.7-7.7 K/uL  2.5  Lymphocytes Absolute Latest Range: 0.7-4.0 K/uL  1.3  Monocytes Absolute Latest Range: 0.1-1.0 K/uL  0.5  Eosinophils Absolute Latest Range: 0.0-0.7 K/uL  0.1  Basophils Absolute Latest Range: 0.0-0.1 K/uL  0.0   Results for NEIRA, BUTSON (MRN [id number]) as of 03/27/2014 14:57  Ref. Range 03/24/2012 15:55 03/27/2013 16:27 03/25/2014 10:22 03/26/2014 06:11 03/27/2014 05:33  Prothrombin Time Latest Range: 11.6-15.2 seconds 48.3 (H) 21.2 (H) 35.4 (H) 36.2 (H) 23.5 (H)  INR Latest Range: 0.00-1.49  5.16 (HH) 1.92 (H) 3.71 (H) 3.83 (H) 2.17 (H)   Results for DEENAH, GRAEF (MRN [id number]) as of 03/27/2014 14:57  Ref. Range 03/26/2014 01:31  Hemoglobin A1C Latest Range: <5.7 % 5.8 (H)  TSH Latest Range: 0.350-4.500 uIU/mL 1.320   Results for DAISSY, BARTOS (MRN [id number]) as of 03/27/2014 14:57  Ref. Range 03/26/2014 01:31  HIV Latest Range: NONREACTIVE  NONREACTIVE   Results for DANICIA, MENDELL (MRN [id number]) as of 03/27/2014 14:57  Ref. Range 03/25/2014 12:58  Color, Urine Latest Range: YELLOW  YELLOW  APPearance Latest Range: CLEAR  CLEAR  Specific Gravity, Urine Latest Range: 1.005-1.030  1.019  pH Latest Range: 5.0-8.0  6.5  Glucose Latest Range: NEGATIVE mg/dL NEGATIVE  Bilirubin Urine Latest Range: NEGATIVE  NEGATIVE  Ketones, ur Latest Range: NEGATIVE mg/dL NEGATIVE  Protein Latest Range: NEGATIVE mg/dL NEGATIVE  Urobilinogen, UA Latest Range: 0.0-1.0 mg/dL 1.0  Nitrite Latest Range: NEGATIVE  NEGATIVE  Leukocytes, UA Latest Range:  NEGATIVE  SMALL (A)  Hgb urine dipstick Latest Range: NEGATIVE  SMALL (A)  WBC, UA Latest Range: <3 WBC/hpf 0-2  RBC / HPF Latest Range: <3 RBC/hpf 3-6  Squamous Epithelial / LPF Latest Range: RARE  FEW (A)  Bacteria, UA Latest Range: RARE  FEW (A)  Results for TAMIKE, SEGGERMAN (MRN [id number]) as of 03/27/2014 14:57  Ref. Range 03/25/2014 18:33  Fecal Occult Blood, POC Latest Range: NEGATIVE  NEGATIVE   Signed: Cresenciano Genre, MD 03/27/2014, 3:32 PM   Time Spent on Discharge: >30 minutes Services Ordered on Discharge: none  Equipment Ordered on Discharge: none

## 2014-03-27 NOTE — Progress Notes (Addendum)
ANTICOAGULATION CONSULT NOTE - Follow Up Consult  Pharmacy Consult for Coumadin Indication: DVT (history)  Allergies  Allergen Reactions  . Penicillins     "Passes out"  . Tomato Hives    From acid in food    Patient Measurements: Height: 5' (152.4 cm) Weight: 225 lb 8.5 oz (102.3 kg) IBW/kg (Calculated) : 45.5  Vital Signs: Temp: 97.8 F (36.6 C) (05/08 0500) Temp src: Oral (05/08 0500) BP: 110/58 mmHg (05/08 0500) Pulse Rate: 77 (05/08 0500)  Labs:  Recent Labs  03/25/14 1022 03/25/14 1756 03/26/14 0131 03/26/14 0611 03/27/14 0533  HGB 13.2  --   --   --   --   HCT 41.0  --   --   --   --   PLT 222  --   --   --   --   LABPROT 35.4*  --   --  36.2* 23.5*  INR 3.71*  --   --  3.83* 2.17*  CREATININE 0.88  --   --   --   --   TROPONINI <0.30 <0.30 <0.30  --   --     Estimated Creatinine Clearance: 78.7 ml/min (by C-G formula based on Cr of 0.88).   Medications:  Prescriptions prior to admission  Medication Sig Dispense Refill  . albuterol (PROVENTIL HFA;VENTOLIN HFA) 108 (90 BASE) MCG/ACT inhaler Inhale 2 puffs into the lungs every 6 (six) hours as needed for wheezing or shortness of breath.       . cetirizine (ZYRTEC) 10 MG tablet Take 10 mg by mouth at bedtime.       . cyclobenzaprine (FLEXERIL) 10 MG tablet Take 10 mg by mouth 3 (three) times daily as needed for muscle spasms.      . diclofenac sodium (VOLTAREN) 1 % GEL Apply 2 g topically daily as needed (muscle pain).       . salsalate (DISALCID) 750 MG tablet Take 750 mg by mouth 3 (three) times daily as needed (for muscle spasms).       . simvastatin (ZOCOR) 20 MG tablet Take 20 mg by mouth every evening.      Marland Kitchen terconazole (TERAZOL 7) 0.4 % vaginal cream Place 1 applicator vaginally at bedtime.      . traZODone (DESYREL) 100 MG tablet Take 100 mg by mouth at bedtime.       . verapamil (VERELAN PM) 240 MG 24 hr capsule Take 240 mg by mouth at bedtime.       Marland Kitchen warfarin (COUMADIN) 10 MG tablet Take 10 mg  by mouth daily.         Assessment: 53 yo F presented to ED with R arm bruising and atypical CP.  Pt is on Coumadin for hx of recurrent VTE and INR is therapeutic today at 2.17.  No bleeding noted. Per patient, her home dose of coumadin was recently decreased to 10 mg daily. She states that her INR is not well controlled at home is concerned that she is only seen at her doctor's office once a month. She seems compliant with her diet and medications.   Goal of Therapy:  INR 2-3 Monitor platelets by anticoagulation protocol: Yes   Plan:  Coumadin 10 mg x 1 dose tonight  Continue daily INR If discharged today, resume home dose of coumadin 10 mg daily and f/u in no more than 2 weeks.   Albertina Parr, PharmD.  Clinical Pharmacist Pager [phone]

## 2014-04-02 ENCOUNTER — Other Ambulatory Visit: Payer: Self-pay | Admitting: Family Medicine

## 2014-04-27 ENCOUNTER — Encounter: Payer: Self-pay | Admitting: *Deleted

## 2014-04-29 ENCOUNTER — Ambulatory Visit: Payer: Medicaid Other | Admitting: Cardiology

## 2014-07-28 ENCOUNTER — Emergency Department (HOSPITAL_COMMUNITY)
Admission: EM | Admit: 2014-07-28 | Discharge: 2014-07-28 | Disposition: A | Discharge status: Home / Self Care | Payer: Medicaid Other | Attending: Emergency Medicine | Admitting: Emergency Medicine

## 2014-07-28 ENCOUNTER — Encounter (HOSPITAL_COMMUNITY): Payer: Self-pay | Admitting: Emergency Medicine

## 2014-07-28 DIAGNOSIS — Z8669 Personal history of other diseases of the nervous system and sense organs: Secondary | ICD-10-CM | POA: Diagnosis not present

## 2014-07-28 DIAGNOSIS — Z87891 Personal history of nicotine dependence: Secondary | ICD-10-CM | POA: Diagnosis not present

## 2014-07-28 DIAGNOSIS — M62838 Other muscle spasm: Secondary | ICD-10-CM | POA: Diagnosis not present

## 2014-07-28 DIAGNOSIS — I10 Essential (primary) hypertension: Secondary | ICD-10-CM | POA: Insufficient documentation

## 2014-07-28 DIAGNOSIS — J45901 Unspecified asthma with (acute) exacerbation: Secondary | ICD-10-CM | POA: Diagnosis not present

## 2014-07-28 DIAGNOSIS — F3289 Other specified depressive episodes: Secondary | ICD-10-CM | POA: Diagnosis not present

## 2014-07-28 DIAGNOSIS — M129 Arthropathy, unspecified: Secondary | ICD-10-CM | POA: Insufficient documentation

## 2014-07-28 DIAGNOSIS — F329 Major depressive disorder, single episode, unspecified: Secondary | ICD-10-CM | POA: Insufficient documentation

## 2014-07-28 DIAGNOSIS — J441 Chronic obstructive pulmonary disease with (acute) exacerbation: Secondary | ICD-10-CM | POA: Insufficient documentation

## 2014-07-28 DIAGNOSIS — Z791 Long term (current) use of non-steroidal anti-inflammatories (NSAID): Secondary | ICD-10-CM | POA: Diagnosis not present

## 2014-07-28 DIAGNOSIS — R6889 Other general symptoms and signs: Secondary | ICD-10-CM | POA: Insufficient documentation

## 2014-07-28 DIAGNOSIS — Z86718 Personal history of other venous thrombosis and embolism: Secondary | ICD-10-CM | POA: Diagnosis not present

## 2014-07-28 DIAGNOSIS — Z880 Allergy status to penicillin: Secondary | ICD-10-CM | POA: Insufficient documentation

## 2014-07-28 DIAGNOSIS — R011 Cardiac murmur, unspecified: Secondary | ICD-10-CM | POA: Insufficient documentation

## 2014-07-28 LAB — BASIC METABOLIC PANEL
Anion gap: 10 (ref 5–15)
BUN: 12 mg/dL (ref 6–23)
CO2: 28 mEq/L (ref 19–32)
Calcium: 9.7 mg/dL (ref 8.4–10.5)
Chloride: 101 mEq/L (ref 96–112)
Creatinine, Ser: 1 mg/dL (ref 0.50–1.10)
GFR calc Af Amer: 73 mL/min — ABNORMAL LOW (ref >90)
GFR calc non Af Amer: 63 mL/min — ABNORMAL LOW (ref >90)
Glucose, Bld: 101 mg/dL — ABNORMAL HIGH (ref 70–99)
Potassium: 4.6 mEq/L (ref 3.7–5.3)
Sodium: 139 mEq/L (ref 137–147)

## 2014-07-28 LAB — TSH: TSH: 0.803 u[IU]/mL (ref 0.350–4.500)

## 2014-07-28 MED ORDER — LORAZEPAM 1 MG PO TABS
1.0000 mg | ORAL_TABLET | Freq: Once | ORAL | Status: AC
  Administered 2014-07-28: 1 mg via ORAL
  Filled 2014-07-28: qty 1

## 2014-07-28 MED ORDER — LORAZEPAM 1 MG PO TABS: 1.0000 mg | ORAL_TABLET | Freq: Three times a day (TID) | ORAL | Status: DC | PRN

## 2014-07-28 NOTE — ED Notes (Signed)
Per [facility] pt has had ongoing muscle spasms for approximately 5 years. Pt states the spasms have been more frequent since this weekend. Pt was experiencing spasms in the face and was concerned of stroke. She takes muscle relaxer's at home to control the spasms.

## 2014-07-28 NOTE — ED Provider Notes (Signed)
CSN: [id number]     Arrival date & time 07/28/14  1225 History   First MD Initiated Contact with Patient 07/28/14 1329     Chief Complaint  Patient presents with  . Spasms     (Consider location/radiation/quality/duration/timing/severity/associated sxs/prior Treatment) HPI Pt is a 53 y/o female w/ PMHx of HTN, COPD, and recurrent DVT who presents to the ED for rt lip muscle spasms. This has been intermittent for the past 1 year but today while patient was at her doctor's office it become more constant. Pt feels like the rt side of her face and neck are pulling her rt lip tight and then feels like a heaviness. Spasms usually go away after a few minutes and are stable throughout the day. At her doctor's office this morning they tested her vision and she noticed that her vision was blurry. She has difficulty talking and swallowing saliva due to lip spasm. She also has generalized muscle cramping which she takes flexeril for. She has baseline SOB 2/2 asthma and COPD. Denies recent illness, travel, camping, and fevers. She has had recurrent DVT's in the past and was recently taken of coumadin during her hospital admission for chest pain in May 2015.   Past Medical History  Diagnosis Date  . Hypertension   . Depression   . Arthritis   . COPD (chronic obstructive pulmonary disease)   . Asthma   . Heart murmur   . Seizures     was on medications  . 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 Hernia repair    . Cesarean section    . Total knee arthroplasty Right   . Rotator cuff repair Left    Family History  Problem Relation Age of Onset  . Hypertension      family history  . Cancer      family history  . Drug abuse      family history  . Other      family history of psychiatric problems, disabilities   History  Substance Use Topics  . Smoking status: Former Smoker    Quit date: 11/20/2008  .  Smokeless tobacco: Former Systems developer  . Alcohol Use: No   OB History   Grav Para Term Preterm Abortions TAB SAB Ect Mult Living                 Review of Systems  Constitutional: Negative for fever.  Eyes: Positive for visual disturbance.  Respiratory: Positive for shortness of breath (baseline 2/2 COPD and asthma).   Cardiovascular: Negative for chest pain.  Genitourinary: Negative for dysuria.  Musculoskeletal:       Muscle cramps       Allergies  Penicillins and Tomato  Home Medications   Prior to Admission medications   Medication Sig Start Date End Date Taking? Authorizing Provider  albuterol (PROVENTIL HFA;VENTOLIN HFA) 108 (90 BASE) MCG/ACT inhaler Inhale 2 puffs into the lungs every 6 (six) hours as needed for wheezing or shortness of breath.     Historical Provider, MD  cetirizine (ZYRTEC) 10 MG tablet Take 10 mg by mouth at bedtime.     Historical Provider, MD  cyclobenzaprine (FLEXERIL) 10 MG tablet Take 10 mg by mouth 3 (three) times daily as needed for muscle spasms.    Historical Provider, MD  diclofenac sodium (VOLTAREN) 1 % GEL Apply 2 g topically daily as needed (  muscle pain).     Historical Provider, MD  salsalate (DISALCID) 750 MG tablet Take 750 mg by mouth 3 (three) times daily as needed (for muscle spasms).     Historical Provider, MD  simvastatin (ZOCOR) 20 MG tablet Take 20 mg by mouth every evening.    Historical Provider, MD  terconazole (TERAZOL 7) 0.4 % vaginal cream Place 1 applicator vaginally at bedtime.    Historical Provider, MD  traZODone (DESYREL) 100 MG tablet Take 100 mg by mouth at bedtime.     Historical Provider, MD  verapamil (VERELAN PM) 240 MG 24 hr capsule Take 240 mg by mouth at bedtime.     Historical Provider, MD   BP 137/77  Pulse 81  Temp(Src) 98.1 F (36.7 C) (Oral)  Resp 18  SpO2 100% Physical Exam  Constitutional: She is oriented to person, place, and time. She appears well-developed and well-nourished.  HENT:  Mouth/Throat:  Oropharynx is clear and moist.  Negative chvostek sign  Eyes: Conjunctivae and EOM are normal. Pupils are equal, round, and reactive to light.  Cardiovascular: Normal rate and regular rhythm.   No murmur heard. Pulmonary/Chest: Effort normal and breath sounds normal. She has no wheezes.  Abdominal: Soft. Bowel sounds are normal. There is no tenderness.  Musculoskeletal:  Able to see muscle spasm of rt corner with cinched skin near rt lower lip   Neurological: She is alert and oriented to person, place, and time. No cranial nerve deficit.  Rt lip corner drooping during exam but smile is symmetric, able to puff out cheeks symmetrically. No deviation on tongue protrusion     ED Course  Procedures (including critical care time) Labs Review Labs Reviewed  BASIC METABOLIC PANEL     MDM   Final diagnoses:  None    Pt has hx of muscle cramping who presents to the ED with worsening of her lip spasms. Pt has hx of recurrent DVTs and has been off coumadin since 03/2014. However, her presentation is not concerning for CVA as rt lip muscle spasm is chronic and muscle spasms were seen on physical exam. No facial asymmetry was noted on smile, no cranial nerve deficits were noted. Will get BMP to check for electrolyte abnormalities and TSH.  Other DDx include myasthenia gravis and giant cell arteritis. Given 1mg  ativan for muscle cramping.       Julious Oka, MD 07/28/14 [phone]

## 2014-08-01 ENCOUNTER — Encounter (HOSPITAL_COMMUNITY): Payer: Self-pay | Admitting: Emergency Medicine

## 2014-08-01 ENCOUNTER — Inpatient Hospital Stay (HOSPITAL_COMMUNITY)
Admission: EM | Admit: 2014-08-01 | Discharge: 2014-08-04 | DRG: 558 | Disposition: A | Discharge status: Home / Self Care | Payer: Medicaid Other | Attending: Internal Medicine | Admitting: Internal Medicine

## 2014-08-01 DIAGNOSIS — E785 Hyperlipidemia, unspecified: Secondary | ICD-10-CM

## 2014-08-01 DIAGNOSIS — F329 Major depressive disorder, single episode, unspecified: Secondary | ICD-10-CM | POA: Diagnosis present

## 2014-08-01 DIAGNOSIS — R791 Abnormal coagulation profile: Secondary | ICD-10-CM

## 2014-08-01 DIAGNOSIS — J45909 Unspecified asthma, uncomplicated: Secondary | ICD-10-CM | POA: Diagnosis present

## 2014-08-01 DIAGNOSIS — F3289 Other specified depressive episodes: Secondary | ICD-10-CM | POA: Diagnosis present

## 2014-08-01 DIAGNOSIS — Z96659 Presence of unspecified artificial knee joint: Secondary | ICD-10-CM

## 2014-08-01 DIAGNOSIS — I9589 Other hypotension: Secondary | ICD-10-CM

## 2014-08-01 DIAGNOSIS — M6282 Rhabdomyolysis: Secondary | ICD-10-CM

## 2014-08-01 DIAGNOSIS — Z86718 Personal history of other venous thrombosis and embolism: Secondary | ICD-10-CM

## 2014-08-01 DIAGNOSIS — E860 Dehydration: Secondary | ICD-10-CM | POA: Diagnosis present

## 2014-08-01 DIAGNOSIS — G40909 Epilepsy, unspecified, not intractable, without status epilepticus: Secondary | ICD-10-CM | POA: Diagnosis present

## 2014-08-01 DIAGNOSIS — J449 Chronic obstructive pulmonary disease, unspecified: Secondary | ICD-10-CM | POA: Diagnosis present

## 2014-08-01 DIAGNOSIS — R0789 Other chest pain: Secondary | ICD-10-CM

## 2014-08-01 DIAGNOSIS — I10 Essential (primary) hypertension: Secondary | ICD-10-CM | POA: Diagnosis present

## 2014-08-01 DIAGNOSIS — M159 Polyosteoarthritis, unspecified: Secondary | ICD-10-CM | POA: Diagnosis present

## 2014-08-01 DIAGNOSIS — T466X5A Adverse effect of antihyperlipidemic and antiarteriosclerotic drugs, initial encounter: Secondary | ICD-10-CM | POA: Diagnosis present

## 2014-08-01 DIAGNOSIS — E872 Acidosis, unspecified: Secondary | ICD-10-CM

## 2014-08-01 DIAGNOSIS — M150 Primary generalized (osteo)arthritis: Secondary | ICD-10-CM

## 2014-08-01 DIAGNOSIS — Z87891 Personal history of nicotine dependence: Secondary | ICD-10-CM

## 2014-08-01 DIAGNOSIS — N179 Acute kidney failure, unspecified: Secondary | ICD-10-CM

## 2014-08-01 DIAGNOSIS — I959 Hypotension, unspecified: Secondary | ICD-10-CM

## 2014-08-01 DIAGNOSIS — Z79899 Other long term (current) drug therapy: Secondary | ICD-10-CM

## 2014-08-01 DIAGNOSIS — M1990 Unspecified osteoarthritis, unspecified site: Secondary | ICD-10-CM

## 2014-08-01 DIAGNOSIS — J4489 Other specified chronic obstructive pulmonary disease: Secondary | ICD-10-CM | POA: Diagnosis present

## 2014-08-01 DIAGNOSIS — M791 Myalgia, unspecified site: Secondary | ICD-10-CM

## 2014-08-01 DIAGNOSIS — Z6841 Body Mass Index (BMI) 40.0 and over, adult: Secondary | ICD-10-CM

## 2014-08-01 DIAGNOSIS — J4520 Mild intermittent asthma, uncomplicated: Secondary | ICD-10-CM

## 2014-08-01 MED ORDER — SODIUM CHLORIDE 0.9 % IV BOLUS (SEPSIS)
1000.0000 mL | Freq: Once | INTRAVENOUS | Status: AC
  Administered 2014-08-01: 1000 mL via INTRAVENOUS

## 2014-08-01 NOTE — ED Notes (Signed)
Reported BP 76/45 to Dr. Colin Rhein, MD gives verbal order for 1L bolus. Dr. Sharol Given aware.

## 2014-08-01 NOTE — ED Notes (Signed)
The pt has had muscle spasms all over her body since this past Wednesday and she was given ativan to take.  She reports that she cannot taKE THIS MED BECAUSE IT MAKES HER FEEL SICK AN STRANGE.  She has chronic muscle spasms

## 2014-08-02 ENCOUNTER — Encounter (HOSPITAL_COMMUNITY): Payer: Self-pay | Admitting: *Deleted

## 2014-08-02 DIAGNOSIS — E872 Acidosis, unspecified: Secondary | ICD-10-CM | POA: Diagnosis present

## 2014-08-02 DIAGNOSIS — M159 Polyosteoarthritis, unspecified: Secondary | ICD-10-CM | POA: Diagnosis present

## 2014-08-02 DIAGNOSIS — I959 Hypotension, unspecified: Secondary | ICD-10-CM | POA: Diagnosis present

## 2014-08-02 DIAGNOSIS — J45909 Unspecified asthma, uncomplicated: Secondary | ICD-10-CM | POA: Diagnosis present

## 2014-08-02 DIAGNOSIS — I10 Essential (primary) hypertension: Secondary | ICD-10-CM | POA: Diagnosis present

## 2014-08-02 DIAGNOSIS — IMO0001 Reserved for inherently not codable concepts without codable children: Secondary | ICD-10-CM

## 2014-08-02 DIAGNOSIS — N179 Acute kidney failure, unspecified: Secondary | ICD-10-CM | POA: Diagnosis present

## 2014-08-02 DIAGNOSIS — Z79899 Other long term (current) drug therapy: Secondary | ICD-10-CM | POA: Diagnosis not present

## 2014-08-02 DIAGNOSIS — Z86718 Personal history of other venous thrombosis and embolism: Secondary | ICD-10-CM | POA: Diagnosis not present

## 2014-08-02 DIAGNOSIS — T466X5A Adverse effect of antihyperlipidemic and antiarteriosclerotic drugs, initial encounter: Secondary | ICD-10-CM | POA: Diagnosis present

## 2014-08-02 DIAGNOSIS — M6282 Rhabdomyolysis: Secondary | ICD-10-CM | POA: Diagnosis not present

## 2014-08-02 DIAGNOSIS — Z6841 Body Mass Index (BMI) 40.0 and over, adult: Secondary | ICD-10-CM | POA: Diagnosis not present

## 2014-08-02 DIAGNOSIS — G40909 Epilepsy, unspecified, not intractable, without status epilepticus: Secondary | ICD-10-CM | POA: Diagnosis present

## 2014-08-02 DIAGNOSIS — Z87891 Personal history of nicotine dependence: Secondary | ICD-10-CM | POA: Diagnosis not present

## 2014-08-02 DIAGNOSIS — J449 Chronic obstructive pulmonary disease, unspecified: Secondary | ICD-10-CM | POA: Diagnosis present

## 2014-08-02 DIAGNOSIS — Z96659 Presence of unspecified artificial knee joint: Secondary | ICD-10-CM | POA: Diagnosis not present

## 2014-08-02 DIAGNOSIS — M1990 Unspecified osteoarthritis, unspecified site: Secondary | ICD-10-CM | POA: Insufficient documentation

## 2014-08-02 DIAGNOSIS — E785 Hyperlipidemia, unspecified: Secondary | ICD-10-CM | POA: Diagnosis present

## 2014-08-02 DIAGNOSIS — I9589 Other hypotension: Secondary | ICD-10-CM

## 2014-08-02 DIAGNOSIS — E860 Dehydration: Secondary | ICD-10-CM | POA: Diagnosis present

## 2014-08-02 DIAGNOSIS — R5381 Other malaise: Secondary | ICD-10-CM | POA: Diagnosis not present

## 2014-08-02 DIAGNOSIS — F329 Major depressive disorder, single episode, unspecified: Secondary | ICD-10-CM | POA: Diagnosis present

## 2014-08-02 DIAGNOSIS — F3289 Other specified depressive episodes: Secondary | ICD-10-CM | POA: Diagnosis present

## 2014-08-02 LAB — COMPREHENSIVE METABOLIC PANEL
ALBUMIN: 3.3 g/dL — AB (ref 3.5–5.2)
ALT: 43 U/L — ABNORMAL HIGH (ref 0–35)
AST: 65 U/L — AB (ref 0–37)
Alkaline Phosphatase: 110 U/L (ref 39–117)
Anion gap: 15 (ref 5–15)
BUN: 26 mg/dL — ABNORMAL HIGH (ref 6–23)
CO2: 25 mEq/L (ref 19–32)
CREATININE: 1.92 mg/dL — AB (ref 0.50–1.10)
Calcium: 8.8 mg/dL (ref 8.4–10.5)
Chloride: 97 mEq/L (ref 96–112)
GFR calc Af Amer: 33 mL/min — ABNORMAL LOW (ref >90)
GFR calc non Af Amer: 28 mL/min — ABNORMAL LOW (ref >90)
Glucose, Bld: 108 mg/dL — ABNORMAL HIGH (ref 70–99)
Potassium: 3.8 mEq/L (ref 3.7–5.3)
SODIUM: 137 meq/L (ref 137–147)
Total Bilirubin: 0.3 mg/dL (ref 0.3–1.2)
Total Protein: 6.6 g/dL (ref 6.0–8.3)

## 2014-08-02 LAB — BASIC METABOLIC PANEL
Anion gap: 12 (ref 5–15)
BUN: 22 mg/dL (ref 6–23)
CALCIUM: 8.3 mg/dL — AB (ref 8.4–10.5)
CO2: 22 meq/L (ref 19–32)
Chloride: 104 mEq/L (ref 96–112)
Creatinine, Ser: 1.37 mg/dL — ABNORMAL HIGH (ref 0.50–1.10)
GFR calc Af Amer: 50 mL/min — ABNORMAL LOW (ref >90)
GFR calc non Af Amer: 43 mL/min — ABNORMAL LOW (ref >90)
GLUCOSE: 102 mg/dL — AB (ref 70–99)
Potassium: 4.3 mEq/L (ref 3.7–5.3)
SODIUM: 138 meq/L (ref 137–147)

## 2014-08-02 LAB — URINALYSIS, ROUTINE W REFLEX MICROSCOPIC
BILIRUBIN URINE: NEGATIVE
Glucose, UA: NEGATIVE mg/dL
HGB URINE DIPSTICK: NEGATIVE
Ketones, ur: NEGATIVE mg/dL
Nitrite: NEGATIVE
Protein, ur: NEGATIVE mg/dL
Specific Gravity, Urine: 1.009 (ref 1.005–1.030)
Urobilinogen, UA: 0.2 mg/dL (ref 0.0–1.0)
pH: 6 (ref 5.0–8.0)

## 2014-08-02 LAB — CBC WITH DIFFERENTIAL/PLATELET
BASOS PCT: 0 % (ref 0–1)
Basophils Absolute: 0 10*3/uL (ref 0.0–0.1)
EOS PCT: 1 % (ref 0–5)
Eosinophils Absolute: 0 10*3/uL (ref 0.0–0.7)
HCT: 42.1 % (ref 36.0–46.0)
Hemoglobin: 14 g/dL (ref 12.0–15.0)
Lymphocytes Relative: 27 % (ref 12–46)
Lymphs Abs: 1.9 10*3/uL (ref 0.7–4.0)
MCH: 29 pg (ref 26.0–34.0)
MCHC: 33.3 g/dL (ref 30.0–36.0)
MCV: 87.3 fL (ref 78.0–100.0)
MONO ABS: 1.1 10*3/uL — AB (ref 0.1–1.0)
Monocytes Relative: 16 % — ABNORMAL HIGH (ref 3–12)
NEUTROS ABS: 3.9 10*3/uL (ref 1.7–7.7)
Neutrophils Relative %: 56 % (ref 43–77)
Platelets: 163 10*3/uL (ref 150–400)
RBC: 4.82 MIL/uL (ref 3.87–5.11)
RDW: 13.4 % (ref 11.5–15.5)
WBC: 7 10*3/uL (ref 4.0–10.5)

## 2014-08-02 LAB — SEDIMENTATION RATE: Sed Rate: 23 mm/hr — ABNORMAL HIGH (ref 0–22)

## 2014-08-02 LAB — CBC
HEMATOCRIT: 41 % (ref 36.0–46.0)
Hemoglobin: 13.3 g/dL (ref 12.0–15.0)
MCH: 28.3 pg (ref 26.0–34.0)
MCHC: 32.4 g/dL (ref 30.0–36.0)
MCV: 87.2 fL (ref 78.0–100.0)
Platelets: 149 10*3/uL — ABNORMAL LOW (ref 150–400)
RBC: 4.7 MIL/uL (ref 3.87–5.11)
RDW: 13.7 % (ref 11.5–15.5)
WBC: 5.4 10*3/uL (ref 4.0–10.5)

## 2014-08-02 LAB — CK
Total CK: 1622 U/L — ABNORMAL HIGH (ref 7–177)
Total CK: 1236 U/L — ABNORMAL HIGH (ref 7–177)

## 2014-08-02 LAB — URINE MICROSCOPIC-ADD ON

## 2014-08-02 LAB — MRSA PCR SCREENING: MRSA by PCR: NEGATIVE

## 2014-08-02 LAB — I-STAT CHEM 8, ED
BUN: 26 mg/dL — ABNORMAL HIGH (ref 6–23)
CREATININE: 2 mg/dL — AB (ref 0.50–1.10)
Calcium, Ion: 1.1 mmol/L — ABNORMAL LOW (ref 1.12–1.23)
Chloride: 102 mEq/L (ref 96–112)
GLUCOSE: 111 mg/dL — AB (ref 70–99)
HEMATOCRIT: 44 % (ref 36.0–46.0)
Hemoglobin: 15 g/dL (ref 12.0–15.0)
Potassium: 3.7 mEq/L (ref 3.7–5.3)
Sodium: 136 mEq/L — ABNORMAL LOW (ref 137–147)
TCO2: 26 mmol/L (ref 0–100)

## 2014-08-02 LAB — RHEUMATOID FACTOR: Rhuematoid fact SerPl-aCnc: <10 IU/mL (ref <=14)

## 2014-08-02 LAB — MAGNESIUM: MAGNESIUM: 1.9 mg/dL (ref 1.5–2.5)

## 2014-08-02 LAB — I-STAT CG4 LACTIC ACID, ED: Lactic Acid, Venous: 3.02 mmol/L — ABNORMAL HIGH (ref 0.5–2.2)

## 2014-08-02 MED ORDER — HYDROMORPHONE HCL PF 1 MG/ML IJ SOLN
0.5000 mg | INTRAMUSCULAR | Status: DC | PRN
  Administered 2014-08-02 – 2014-08-04 (×10): 1 mg via INTRAVENOUS
  Filled 2014-08-02 (×12): qty 1

## 2014-08-02 MED ORDER — LORATADINE 10 MG PO TABS
10.0000 mg | ORAL_TABLET | Freq: Every day | ORAL | Status: DC
  Administered 2014-08-02: 10 mg via ORAL
  Filled 2014-08-02: qty 1

## 2014-08-02 MED ORDER — OXYCODONE HCL 5 MG PO TABS
5.0000 mg | ORAL_TABLET | ORAL | Status: DC | PRN
  Administered 2014-08-02 – 2014-08-03 (×2): 5 mg via ORAL
  Filled 2014-08-02 (×2): qty 1

## 2014-08-02 MED ORDER — SODIUM CHLORIDE 0.9 % IV SOLN
Freq: Once | INTRAVENOUS | Status: AC
  Administered 2014-08-02: 01:00:00 via INTRAVENOUS

## 2014-08-02 MED ORDER — SODIUM CHLORIDE 0.9 % IV BOLUS (SEPSIS)
1000.0000 mL | Freq: Once | INTRAVENOUS | Status: AC
  Administered 2014-08-02: 1000 mL via INTRAVENOUS

## 2014-08-02 MED ORDER — SODIUM CHLORIDE 0.9 % IV SOLN
INTRAVENOUS | Status: DC
  Administered 2014-08-02 (×2): via INTRAVENOUS

## 2014-08-02 MED ORDER — ENOXAPARIN SODIUM 60 MG/0.6ML ~~LOC~~ SOLN
50.0000 mg | SUBCUTANEOUS | Status: DC
  Administered 2014-08-03: 50 mg via SUBCUTANEOUS
  Administered 2014-08-04: 11:00:00 via SUBCUTANEOUS
  Filled 2014-08-02 (×2): qty 0.6

## 2014-08-02 MED ORDER — FLUTICASONE PROPIONATE 50 MCG/ACT NA SUSP
1.0000 | Freq: Every day | NASAL | Status: DC | PRN
  Filled 2014-08-02: qty 16

## 2014-08-02 MED ORDER — SODIUM CHLORIDE 0.9 % IV SOLN: INTRAVENOUS | Status: AC

## 2014-08-02 MED ORDER — ONDANSETRON HCL 4 MG PO TABS: 4.0000 mg | ORAL_TABLET | Freq: Four times a day (QID) | ORAL | Status: DC | PRN

## 2014-08-02 MED ORDER — PANTOPRAZOLE SODIUM 40 MG PO TBEC
40.0000 mg | DELAYED_RELEASE_TABLET | Freq: Every day | ORAL | Status: DC
Start: 2014-08-02 — End: 2014-08-02
  Administered 2014-08-02: 40 mg via ORAL
  Filled 2014-08-02: qty 1

## 2014-08-02 MED ORDER — LORATADINE 10 MG PO TABS
10.0000 mg | ORAL_TABLET | Freq: Every day | ORAL | Status: DC | PRN
  Filled 2014-08-02: qty 1

## 2014-08-02 MED ORDER — INFLUENZA VAC SPLIT QUAD 0.5 ML IM SUSY: 0.5000 mL | PREFILLED_SYRINGE | INTRAMUSCULAR | Status: DC

## 2014-08-02 MED ORDER — HYDROCODONE-ACETAMINOPHEN 5-325 MG PO TABS: 1.0000 | ORAL_TABLET | Freq: Four times a day (QID) | ORAL | Status: DC | PRN

## 2014-08-02 MED ORDER — WHITE PETROLATUM GEL
Status: AC
  Administered 2014-08-02: 0.2
  Filled 2014-08-02: qty 5

## 2014-08-02 MED ORDER — INFLUENZA VAC SPLIT QUAD 0.5 ML IM SUSY
0.5000 mL | PREFILLED_SYRINGE | INTRAMUSCULAR | Status: DC | PRN
  Filled 2014-08-02: qty 0.5

## 2014-08-02 MED ORDER — ACETAMINOPHEN 650 MG RE SUPP: 650.0000 mg | Freq: Four times a day (QID) | RECTAL | Status: DC | PRN

## 2014-08-02 MED ORDER — ONDANSETRON HCL 4 MG/2ML IJ SOLN: 4.0000 mg | Freq: Four times a day (QID) | INTRAMUSCULAR | Status: DC | PRN

## 2014-08-02 MED ORDER — ONDANSETRON HCL 4 MG/2ML IJ SOLN
4.0000 mg | Freq: Once | INTRAMUSCULAR | Status: AC
  Administered 2014-08-02: 4 mg via INTRAVENOUS
  Filled 2014-08-02: qty 2

## 2014-08-02 MED ORDER — TRAZODONE HCL 100 MG PO TABS
100.0000 mg | ORAL_TABLET | Freq: Every evening | ORAL | Status: DC | PRN
  Administered 2014-08-02 – 2014-08-03 (×2): 100 mg via ORAL
  Filled 2014-08-02 (×2): qty 1

## 2014-08-02 MED ORDER — CYCLOBENZAPRINE HCL 10 MG PO TABS
10.0000 mg | ORAL_TABLET | Freq: Three times a day (TID) | ORAL | Status: DC | PRN
  Filled 2014-08-02: qty 1

## 2014-08-02 MED ORDER — ALBUTEROL SULFATE (2.5 MG/3ML) 0.083% IN NEBU: 3.0000 mL | INHALATION_SOLUTION | Freq: Four times a day (QID) | RESPIRATORY_TRACT | Status: DC | PRN

## 2014-08-02 MED ORDER — METHOCARBAMOL 500 MG PO TABS
1000.0000 mg | ORAL_TABLET | Freq: Once | ORAL | Status: AC
  Administered 2014-08-02: 1000 mg via ORAL
  Filled 2014-08-02: qty 2

## 2014-08-02 MED ORDER — ACETAMINOPHEN 325 MG PO TABS: 650.0000 mg | ORAL_TABLET | Freq: Four times a day (QID) | ORAL | Status: DC | PRN

## 2014-08-02 MED ORDER — POTASSIUM CHLORIDE CRYS ER 20 MEQ PO TBCR
40.0000 meq | EXTENDED_RELEASE_TABLET | Freq: Once | ORAL | Status: AC
  Administered 2014-08-02: 40 meq via ORAL
  Filled 2014-08-02: qty 2

## 2014-08-02 MED ORDER — ENOXAPARIN SODIUM 30 MG/0.3ML ~~LOC~~ SOLN
30.0000 mg | SUBCUTANEOUS | Status: DC
  Administered 2014-08-02: 30 mg via SUBCUTANEOUS
  Filled 2014-08-02: qty 0.3

## 2014-08-02 NOTE — ED Notes (Signed)
Phlebotomy is at the bedside.

## 2014-08-02 NOTE — ED Notes (Signed)
Dr. Sharol Given is aware of BP 81/50, 2nd bolus started.

## 2014-08-02 NOTE — ED Provider Notes (Signed)
I saw and evaluated the patient, reviewed the resident's note and I agree with the findings and plan.   EKG Interpretation None     Pt with facial twitching/spasm. Nonfocal neuro exam. Lytes ok. No obvious offending medications. Plenty of rest. Avoid caffeine/stimulants. Return precautions discussed.    Virgel Manifold, MD 08/02/14 [phone]

## 2014-08-02 NOTE — ED Notes (Signed)
Attempted to call Dr Sharol Given to review CK of 1622. Patient complaining of nausea.

## 2014-08-02 NOTE — ED Notes (Signed)
Hospitalist at bedside 

## 2014-08-02 NOTE — ED Notes (Signed)
Dr. Sharol Given aware of CK of 1622. No new orders at this time

## 2014-08-02 NOTE — ED Notes (Signed)
Patient nauseous, PO medications held for zofran to assist with nausea symptoms

## 2014-08-02 NOTE — ED Notes (Signed)
Called main lab, spoke to Morton, in regards to new lab order of CK to be added on. She will process.  Also discussed plan of care with Dr. Sharol Given. bP is 91 systolic after 2L bolus, and new onset of renal failure.  MD will order maintenance IV fluids for BP.

## 2014-08-02 NOTE — ED Notes (Signed)
Phlebotomy at the bedside  

## 2014-08-02 NOTE — Progress Notes (Signed)
Patient admitted after midnight. Chart reviewed. Patient examined. Likely statin induced myopathy/mild rhabdomyolysis. ARF from NSAIDS. Blood pressures still soft, but improved. Continue SDU and IVF. Hold antihypertensives, statin, NSAIDS  Doree Barthel, MD [facility] [phone]

## 2014-08-02 NOTE — ED Provider Notes (Signed)
CSN: [id number]     Arrival date & time 08/01/14  2159 History   First MD Initiated Contact with Patient 08/01/14 2308     Chief Complaint  Patient presents with  . Spasms     (Consider location/radiation/quality/duration/timing/severity/associated sxs/prior Treatment) HPI 53 year old female presents to emergency apartment from home with complaint of diffuse muscle spasms.  Patient has history of chronic muscle spasms for which she takes Flexeril, but over the last several days symptoms have worsened.  Patient was seen in the emergency department on Wednesday, and given Ativan.  She reports that taking this medicine makes her feel drowsy and out of it and she does not like the way it makes her feel.  Patient has been seen by her chronic pain Dr. as well as orthopedics this week.  She was given a cortisone injection for her arthralgia.  Patient denies any other new medications, she reports that she is eating well drinking well.  Patient denies previous history of low blood pressures or low potassium.  She denies any fever or chills, no nausea vomiting or diarrhea. Past Medical History  Diagnosis Date  . Hypertension   . Depression   . Arthritis   . COPD (chronic obstructive pulmonary disease)   . Asthma   . Heart murmur   . Seizures     was on medications  . 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 Hernia repair    . Cesarean section    . Total knee arthroplasty Right   . Rotator cuff repair Left    Family History  Problem Relation Age of Onset  . Hypertension      family history  . Cancer      family history  . Drug abuse      family history  . Other      family history of psychiatric problems, disabilities   History  Substance Use Topics  . Smoking status: Former Smoker    Quit date: 11/20/2008  . Smokeless tobacco: Former Systems developer  . Alcohol Use: No   OB History   Grav Para Term  Preterm Abortions TAB SAB Ect Mult Living                 Review of Systems  See History of Present Illness; otherwise all other systems are reviewed and negative   Allergies  Penicillins and Tomato  Home Medications   Prior to Admission medications   Medication Sig Start Date End Date Taking? Authorizing Provider  albuterol (PROVENTIL HFA;VENTOLIN HFA) 108 (90 BASE) MCG/ACT inhaler Inhale 2 puffs into the lungs every 6 (six) hours as needed for wheezing or shortness of breath.    Yes Historical Provider, MD  cetirizine (ZYRTEC) 10 MG tablet Take 10 mg by mouth at bedtime.    Yes Historical Provider, MD  cyclobenzaprine (FLEXERIL) 10 MG tablet Take 10 mg by mouth 3 (three) times daily as needed for muscle spasms.   Yes Historical Provider, MD  diclofenac sodium (VOLTAREN) 1 % GEL Apply 2 g topically daily as needed (muscle pain).    Yes Historical Provider, MD  fluticasone (FLONASE) 50 MCG/ACT nasal spray Place 1 spray into both nostrils daily as needed for allergies or rhinitis.   Yes Historical Provider, MD  HYDROcodone-acetaminophen (NORCO/VICODIN) 5-325 MG per tablet Take 1 tablet by mouth every 6 (six) hours as needed for moderate pain.  Yes Historical Provider, MD  lisinopril (PRINIVIL,ZESTRIL) 10 MG tablet Take 10 mg by mouth daily.   Yes Historical Provider, MD  LORazepam (ATIVAN) 1 MG tablet Take 1 tablet (1 mg total) by mouth every 8 (eight) hours as needed (muscle spasm). 07/28/14  Yes Virgel Manifold, MD  salsalate (DISALCID) 750 MG tablet Take 750 mg by mouth 3 (three) times daily as needed (for muscle spasms).    Yes Historical Provider, MD  simvastatin (ZOCOR) 20 MG tablet Take 20 mg by mouth every evening.   Yes Historical Provider, MD  terconazole (TERAZOL 7) 0.4 % vaginal cream Place 1 applicator vaginally at bedtime.   Yes Historical Provider, MD  traZODone (DESYREL) 100 MG tablet Take 100 mg by mouth at bedtime.    Yes Historical Provider, MD  verapamil (VERELAN PM) 240 MG  24 hr capsule Take 240 mg by mouth at bedtime.    Yes Historical Provider, MD   BP 108/59  Pulse 84  Temp(Src) 97.7 F (36.5 C) (Oral)  Resp 15  Ht 5' (1.524 m)  Wt 220 lb (99.791 kg)  BMI 42.97 kg/m2  SpO2 97% Physical Exam  Nursing note and vitals reviewed. Constitutional: She is oriented to person, place, and time. She appears well-developed and well-nourished. She appears distressed (uncomfortable appearing, crying).  Patient noted be significantly hypotensive.  She does not appear to be symptomatic with his blood pressure.  HENT:  Head: Normocephalic and atraumatic.  Nose: Nose normal.  Mouth/Throat: Oropharynx is clear and moist.  Eyes: Conjunctivae and EOM are normal. Pupils are equal, round, and reactive to light.  Neck: Normal range of motion. Neck supple. No JVD present. No tracheal deviation present. No thyromegaly present.  Cardiovascular: Normal rate, regular rhythm, normal heart sounds and intact distal pulses.  Exam reveals no gallop and no friction rub.   No murmur heard. Pulmonary/Chest: Effort normal and breath sounds normal. No stridor. No respiratory distress. She has no wheezes. She has no rales. She exhibits no tenderness.  Abdominal: Soft. Bowel sounds are normal. She exhibits no distension and no mass. There is no tenderness. There is no rebound and no guarding.  Musculoskeletal: Normal range of motion. She exhibits no edema and no tenderness.  Patient having intermittent cramping of left hand.  She reports cramping over entire body, this was not visualized.  Lymphadenopathy:    She has no cervical adenopathy.  Neurological: She is alert and oriented to person, place, and time. She displays normal reflexes. She exhibits normal muscle tone. Coordination normal.  Skin: Skin is warm and dry. No rash noted. No erythema. No pallor.  Psychiatric: She has a normal mood and affect. Her behavior is normal. Judgment and thought content normal.    ED Course  Procedures  (including critical care time) Labs Review Labs Reviewed  CBC WITH DIFFERENTIAL - Abnormal; Notable for the following:    Monocytes Relative 16 (*)    Monocytes Absolute 1.1 (*)    All other components within normal limits  COMPREHENSIVE METABOLIC PANEL - Abnormal; Notable for the following:    Glucose, Bld 108 (*)    BUN 26 (*)    Creatinine, Ser 1.92 (*)    Albumin 3.3 (*)    AST 65 (*)    ALT 43 (*)    GFR calc non Af Amer 28 (*)    GFR calc Af Amer 33 (*)    All other components within normal limits  URINALYSIS, ROUTINE W REFLEX MICROSCOPIC - Abnormal; Notable for  the following:    APPearance HAZY (*)    Leukocytes, UA SMALL (*)    All other components within normal limits  CK - Abnormal; Notable for the following:    Total CK 1622 (*)    All other components within normal limits  CK - Abnormal; Notable for the following:    Total CK 1236 (*)    All other components within normal limits  URINE MICROSCOPIC-ADD ON - Abnormal; Notable for the following:    Casts HYALINE CASTS (*)    All other components within normal limits  BASIC METABOLIC PANEL - Abnormal; Notable for the following:    Glucose, Bld 102 (*)    Creatinine, Ser 1.37 (*)    Calcium 8.3 (*)    GFR calc non Af Amer 43 (*)    GFR calc Af Amer 50 (*)    All other components within normal limits  CBC - Abnormal; Notable for the following:    Platelets 149 (*)    All other components within normal limits  I-STAT CG4 LACTIC ACID, ED - Abnormal; Notable for the following:    Lactic Acid, Venous 3.02 (*)    All other components within normal limits  I-STAT CHEM 8, ED - Abnormal; Notable for the following:    Sodium 136 (*)    BUN 26 (*)    Creatinine, Ser 2.00 (*)    Glucose, Bld 111 (*)    Calcium, Ion 1.10 (*)    All other components within normal limits  MRSA PCR SCREENING  MAGNESIUM  ANA  RHEUMATOID FACTOR  HISTONE ANTIBODIES, IGG, BLOOD  SEDIMENTATION RATE    Imaging Review No results found.    EKG Interpretation   Date/Time:  Sunday August 02 2014 00:10:44 EDT Ventricular Rate:  97 PR Interval:  141 QRS Duration: 78 QT Interval:  378 QTC Calculation: 480 R Axis:   69 Text Interpretation:  Sinus rhythm Confirmed by Berkley Wrightsman  MD, Lokelani Lutes ([id number]) on  08/02/2014 1:31:34 AM      MDM   Final diagnoses:  Acute renal failure, unspecified acute renal failure type  Non-traumatic rhabdomyolysis  Muscle pain  Hypotension, unspecified hypotension type    53 year old female with unexplained hypotension.  I went over her medication list with her, it does not appear by her report that she has overdone any of her medications.  Plan for fluid boluses, labs, and reassess.  1:30 AM The patient has had an acute rise in her creatinine from 1-2 over the last 3 days.  I am unsure why she has had such a drop in her renal function.  She has mild elevation in her AST and ALT.  Blood pressures have improved with 2 L of fluid.  She is still experiencing pain.  We'll give her oral potassium as well as Robaxin to help with muscle cramping.  Will discuss with hospitalist for admission.  Urine is pending.  Total CK is pending    Kalman Drape, MD 08/02/14 [phone]

## 2014-08-02 NOTE — ED Notes (Signed)
Clarified with Dr. Sharol Given, patient is not a code sepsis.

## 2014-08-02 NOTE — ED Notes (Signed)
Reported nausea to Dr. Sharol Given. MD gives order for iv zofran

## 2014-08-02 NOTE — ED Notes (Signed)
Dr Sharol Given given a copy of lactic acid results 3.02

## 2014-08-02 NOTE — H&P (Signed)
[facility] Admission History and Physical       Amanda Christensen PYK:[id number] DOB: Apr 24, 1961 DOA: 08/01/2014  Referring physician:  PCP: Elizabeth Palau, MD  Specialists:   Chief Complaint:   HPI: Amanda Christensen is a 53 y.o. female with a history of Asthma, HTN, and Hyperlipidemia who presents to the ED with complaints of increased muscle spasms and  Generalized weakness over the past 2 weeks.    She denies any fevers or chills but reports having nausea.   She was seen in the ED on 09/08 for her symptoms and given an Rx for Ativan of which  Made her feel like she was in a trance so she did not take anymore of it.   She was evaluated in the ED and was found to have an elevated CPK level of 1622, and a Lactic Acid level of 3.02 along with an elevated BUN/Cr level of 26/2.00.    She was also found to have hypotension and was administered a total of 2 liters of IVFs with only mild improvement.  She was referred for medical admission to a Stepdown Bed.      Review of Systems:  Constitutional: No Weight Loss, No Weight Gain, Night Sweats, Fevers, Chills, Dizziness, +Myalgias,  Fatigue, +Generalized Weakness HEENT: No Headaches, Difficulty Swallowing,Tooth/Dental Problems,Sore Throat,  No Sneezing, Rhinitis, Ear Ache, Nasal Congestion, or Post Nasal Drip,  Cardio-vascular:  No Chest pain, Orthopnea, PND, Edema in Lower Extremities, Anasarca, Dizziness, Palpitations  Resp: No Dyspnea, No DOE, No Productive Cough, No Non-Productive Cough, No Hemoptysis, No Wheezing.    GI: No Heartburn, Indigestion, Abdominal Pain, Nausea, Vomiting, Diarrhea, Hematemesis, Hematochezia, Melena, Change in Bowel Habits,  Loss of Appetite  GU: No Dysuria, Change in Color of Urine, No Urgency or Frequency, No Flank pain.  Musculoskeletal: No Joint Pain or Swelling, No Decreased Range of Motion, No Back Pain.  Neurologic: No Syncope, No Seizures, +Muscle Cramps and Weakness, Paresthesia, Vision Disturbance  or Loss, No Diplopia, No Vertigo, No Difficulty Walking,  Skin: No Rash or Lesions. Psych: No Change in Mood or Affect, No Depression or Anxiety, No Memory loss, No Confusion, or Hallucinations   Past Medical History  Diagnosis Date  . Hypertension   . Depression   . Arthritis   . COPD (chronic obstructive pulmonary disease)   . Asthma   . Heart murmur   . Seizures     was on medications  . 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 Hernia repair    . Cesarean section    . Total knee arthroplasty Right   . Rotator cuff repair Left       Prior to Admission medications   Medication Sig Start Date End Date Taking? Authorizing Provider  albuterol (PROVENTIL HFA;VENTOLIN HFA) 108 (90 BASE) MCG/ACT inhaler Inhale 2 puffs into the lungs every 6 (six) hours as needed for wheezing or shortness of breath.    Yes Historical Provider, MD  cetirizine (ZYRTEC) 10 MG tablet Take 10 mg by mouth at bedtime.    Yes Historical Provider, MD  cyclobenzaprine (FLEXERIL) 10 MG tablet Take 10 mg by mouth 3 (three) times daily as needed for muscle spasms.   Yes Historical Provider, MD  diclofenac sodium (VOLTAREN) 1 % GEL Apply 2 g topically daily as needed (muscle pain).    Yes Historical Provider, MD  fluticasone (FLONASE) 50  MCG/ACT nasal spray Place 1 spray into both nostrils daily as needed for allergies or rhinitis.   Yes Historical Provider, MD  HYDROcodone-acetaminophen (NORCO/VICODIN) 5-325 MG per tablet Take 1 tablet by mouth every 6 (six) hours as needed for moderate pain.   Yes Historical Provider, MD  lisinopril (PRINIVIL,ZESTRIL) 10 MG tablet Take 10 mg by mouth daily.   Yes Historical Provider, MD  LORazepam (ATIVAN) 1 MG tablet Take 1 tablet (1 mg total) by mouth every 8 (eight) hours as needed (muscle spasm). 07/28/14  Yes Virgel Manifold, MD  salsalate (DISALCID) 750 MG tablet Take 750 mg by mouth 3  (three) times daily as needed (for muscle spasms).    Yes Historical Provider, MD  simvastatin (ZOCOR) 20 MG tablet Take 20 mg by mouth every evening.   Yes Historical Provider, MD  terconazole (TERAZOL 7) 0.4 % vaginal cream Place 1 applicator vaginally at bedtime.   Yes Historical Provider, MD  traZODone (DESYREL) 100 MG tablet Take 100 mg by mouth at bedtime.    Yes Historical Provider, MD  verapamil (VERELAN PM) 240 MG 24 hr capsule Take 240 mg by mouth at bedtime.    Yes Historical Provider, MD     Allergies  Allergen Reactions  . Penicillins Other (See Comments)    syncope  . Tomato Hives    From acid in food    Social History:  reports that she quit smoking about 5 years ago. She has quit using smokeless tobacco. She reports that she does not drink alcohol or use illicit drugs.     Family History  Problem Relation Age of Onset  . Hypertension      family history  . Cancer      family history  . Drug abuse      family history  . Other      family history of psychiatric problems, disabilities      Physical Exam:  GEN:  Pleasant Obese 53 y.o. African American female examined  and in no acute distress; cooperative with exam Filed Vitals:   08/02/14 0100 08/02/14 0145 08/02/14 0146 08/02/14 0200  BP: 108/59 122/77 122/77 86/44  Pulse:   93   Temp:      TempSrc:      Resp: '15 23 26 16  ' Height:      Weight:      SpO2:   93%    Blood pressure 86/44, pulse 93, temperature 97.7 F (36.5 C), temperature source Oral, resp. rate 16, height 5' (1.524 m), weight 99.791 kg (220 lb), SpO2 93.00%. PSYCH: She is alert and oriented x4; does not appear anxious does not appear depressed; affect is normal HEENT: Normocephalic and Atraumatic, Mucous membranes pink; PERRLA; EOM intact; Fundi:  Benign;  No scleral icterus, Nares: Patent, Oropharynx: Clear, Fair Dentition,     Neck:  FROM, No Cervical Lymphadenopathy nor Thyromegaly or Carotid Bruit; No JVD; Breasts:: Not  examined CHEST WALL: No tenderness CHEST: Normal respiration, clear to auscultation bilaterally HEART: Regular rate and rhythm; no murmurs rubs or gallops BACK: No kyphosis or scoliosis; No CVA tenderness ABDOMEN: Positive Bowel Sounds, Obese, Soft Non-Tender; No Masses, No Organomegaly. Rectal Exam: Not done EXTREMITIES: No Cyanosis, Clubbing, or Edema; No Ulcerations. Genitalia: not examined PULSES: 2+ and symmetric SKIN: Normal hydration no rash or ulceration CNS:  Mental Status:  Alert, Oriented, Thought Content Appropriate. Speech Fluent without evidence of Aphasia. Able to follow 3 step commands without difficulty.  In No obvious pain.   Cranial  Nerves:  II: Discs flat bilaterally; Visual fields Intact, Pupils equal and reactive.    III,IV, VI: Extra-ocular motions intact bilaterally    V,VII: smile symmetric, facial light touch sensation normal bilaterally    VIII: hearing intact bilaterally    IX,X: gag reflex present    XI: bilateral shoulder shrug    XII: midline tongue extension   Motor:  Right:  Upper extremity 5/5     Left:  Upper extremity 5/5     Right:  Lower extremity 5/5    Left:  Lower extremity 5/5     Tone and Bulk:  normal tone throughout; no atrophy noted  Sensory:  Pinprick and light touch intact throughout, bilaterally   Deep Tendon Reflexes: 2+ and symmetric throughout   Plantars/ Babinski:  Right: normal Left: normal    Cerebellar:  Finger to nose with or without difficulty.   Gait: deferred   Vascular: pulses palpable throughout    Labs on Admission:  Basic Metabolic Panel:  Recent Labs Lab 07/28/14 1409 08/02/14 0031 08/02/14 0041  NA 139 137 136*  K 4.6 3.8 3.7  CL 101 97 102  CO2 28 25  --   GLUCOSE 101* 108* 111*  BUN 12 26* 26*  CREATININE 1.00 1.92* 2.00*  CALCIUM 9.7 8.8  --    Liver Function Tests:  Recent Labs Lab 08/02/14 0031  AST 65*  ALT 43*  ALKPHOS 110  BILITOT 0.3  PROT 6.6  ALBUMIN 3.3*   No results found  for this basename: LIPASE, AMYLASE,  in the last 168 hours No results found for this basename: AMMONIA,  in the last 168 hours CBC:  Recent Labs Lab 08/02/14 0031 08/02/14 0041  WBC 7.0  --   NEUTROABS 3.9  --   HGB 14.0 15.0  HCT 42.1 44.0  MCV 87.3  --   PLT 163  --    Cardiac Enzymes:  Recent Labs Lab 08/02/14 0031  CKTOTAL 1622*    BNP (last 3 results)  Recent Labs  03/25/14 1022  PROBNP 95.7   CBG: No results found for this basename: GLUCAP,  in the last 168 hours  Radiological Exams on Admission: No results found.     Assessment/Plan:   53 y.o. female with   Principal Problem:   1.   Rhabdomyolysis-  Differential includes Statin Induced Myopathy, versus an Auto-Immune versus Drug Induced Myopathy   IVFs and Monitor CPK Levels   Discontinued Statin Therapy- Possible Statin Induced Myopathy   Discontinued Verapamil - Possible Drug Induced    Sending ESR, ANA, RF, and Anti -Histone Antibodies ( for Drug Induced SLE)    Consider labs for Myositis     Active Problems:   2.   Acute renal failure   IVFs   Discontinue NSAIDS,   Discontinue Lisinopril Rx   Monitor BUN/Cr     3.   Hypotension- due to slow elimination of her BP medications due to ARF/AKI   IVFs   Monitor BPs   Discontinued BP Medications for Now      4.   Lactic acidosis   IVFs       5.   Hyperlipidemia   On Statin Rx   Discontinue Statin Rx (Zocor) for now     6.   Asthma   Albuterol Inhaler QID PRN     7.   Osteoarthritis   On Disalsid Rx   Discontinue Due to ARF/AKI   Pain Control with Oxycodone PRN moderate Pain  and IV Dilaudid PRN Severe Pain     8.   DVT Prophylaxis   Lovenox    Code Status:  FULL CODE   Family Communication:     Disposition Plan:   Inpatient  Time spent:  [place] [facility] Pager [phone]   If [place] Please Contact the Day Rounding Team MD for [facility]  If 7PM-7AM, Please Contact  night-coverage  [email or web address] Password [email or web address] 08/02/2014, 2:28 AM

## 2014-08-02 NOTE — ED Notes (Signed)
Dr. Otter at the bedside.  

## 2014-08-02 NOTE — ED Notes (Signed)
Sed rate and mag is still needed for labs. Report to receiving RN.

## 2014-08-03 LAB — BASIC METABOLIC PANEL
ANION GAP: 8 (ref 5–15)
BUN: 16 mg/dL (ref 6–23)
CO2: 25 mEq/L (ref 19–32)
Calcium: 8.6 mg/dL (ref 8.4–10.5)
Chloride: 104 mEq/L (ref 96–112)
Creatinine, Ser: 0.93 mg/dL (ref 0.50–1.10)
GFR calc non Af Amer: 68 mL/min — ABNORMAL LOW (ref >90)
GFR, EST AFRICAN AMERICAN: 79 mL/min — AB (ref >90)
Glucose, Bld: 89 mg/dL (ref 70–99)
Potassium: 5.1 mEq/L (ref 3.7–5.3)
Sodium: 137 mEq/L (ref 137–147)

## 2014-08-03 LAB — ANA: Anti Nuclear Antibody(ANA): NEGATIVE

## 2014-08-03 LAB — CK: CK TOTAL: 685 U/L — AB (ref 7–177)

## 2014-08-03 LAB — HISTONE ANTIBODIES, IGG, BLOOD: DNA-Histone: <1

## 2014-08-03 MED ORDER — WHITE PETROLATUM GEL
Status: AC
  Administered 2014-08-03: 1
  Filled 2014-08-03: qty 5

## 2014-08-03 MED ORDER — ALBUTEROL SULFATE (2.5 MG/3ML) 0.083% IN NEBU
3.0000 mL | INHALATION_SOLUTION | RESPIRATORY_TRACT | Status: DC | PRN
  Administered 2014-08-04: 3 mL via RESPIRATORY_TRACT
  Filled 2014-08-03: qty 3

## 2014-08-03 MED ORDER — DICLOFENAC SODIUM 1 % TD GEL
2.0000 g | Freq: Every day | TRANSDERMAL | Status: DC | PRN
  Filled 2014-08-03: qty 100

## 2014-08-03 NOTE — Progress Notes (Signed)
Amanda Christensen TEAM 1 - Stepdown/ICU TEAM Progress Note  Amanda Christensen GEZ:[id number] DOB: 12/02/1960 DOA: 08/01/2014 PCP: Elizabeth Palau, MD  Admit HPI / Brief Narrative: 53 y.o. female with a history of Asthma, HTN, and Hyperlipidemia who presented to the ED with complaints of increased muscle spasms and generalized weakness over 2 weeks. She denied any fevers or chills but reported nausea. She was seen in the ED on 09/08 for her symptoms and given an Rx for Ativan which made her feel like she was in a trance so she did not take any more of it. She was re-evaluated in the ED on the day of her admit and was found to have an elevated CPK level of 1622, and a lactic acid level of 3.02 along with an elevated BUN/Cr of 26/2.00. She was also found to have hypotension.  HPI/Subjective: Pt c/o ogoing cramping soreness in B LE with some tingling sensations intermittently.  Denies sob, cp, n/v, or abdom pain.    Assessment/Plan:  Rhabdomyolysis ?due to statin - zocor stopped - hydrating and CK improving - recheck in AM - begin to ambulate   Acute renal failure Stopped NSAIDs and ACE - corrected w/ volume and avoidance of offending meds - crt peaked at 1.92  Hypotension Resolved w/ volume expansion and holding of BP meds  +UA UA is technically + but pt is asymptomatic - will hold on abx and check culture   Lactic acidosis Recheck in AM - should be resolved w/ volume  Mild transamitinits ?zocor v/s fatty liver - recheck in AM   HLD Avoid statin for now - recheck as outpt   Asthma Well compensated at present   OA  Morbid obesity - Body mass index is 43.79 kg/(m^2).  Code Status: FULL Family Communication: friend present at bedside  Disposition Plan: stable for transfer to med bed - begin PT/OT  Consultants: none  Procedures: none  Antibiotics: none  DVT prophylaxis: lovenox  Objective: Blood pressure 121/62, pulse 94, temperature 97.9 F (36.6 C), temperature source  Oral, resp. rate 20, height 5' (1.524 m), weight 101.7 kg (224 lb 3.3 oz), SpO2 91.00%.  Intake/Output Summary (Last 24 hours) at 08/03/14 1341 Last data filed at 08/03/14 1201  Gross per 24 hour  Intake   2235 ml  Output   1650 ml  Net    585 ml   Exam: General: No acute respiratory distress Lungs: Clear to auscultation bilaterally without wheezes or crackles Cardiovascular: Regular rate and rhythm without murmur gallop or rub normal S1 and S2 Abdomen: Nontender, nondistended, soft, bowel sounds positive, no rebound, no ascites, no appreciable mass Extremities: No significant cyanosis, clubbing, or edema bilateral lower extremities  Data Reviewed: Basic Metabolic Panel:  Recent Labs Lab 07/28/14 1409 08/02/14 0031 08/02/14 0041 08/02/14 0556 08/03/14 0520  NA 139 137 136* 138 137  K 4.6 3.8 3.7 4.3 5.1  CL 101 97 102 104 104  CO2 28 25  --  22 25  GLUCOSE 101* 108* 111* 102* 89  BUN 12 26* 26* 22 16  CREATININE 1.00 1.92* 2.00* 1.37* 0.93  CALCIUM 9.7 8.8  --  8.3* 8.6  MG  --   --   --  1.9  --    Liver Function Tests:  Recent Labs Lab 08/02/14 0031  AST 65*  ALT 43*  ALKPHOS 110  BILITOT 0.3  PROT 6.6  ALBUMIN 3.3*   Coags: No results found for this basename: PT, INR,  in the last  168 hours No results found for this basename: PTT,  in the last 168 hours  CBC:  Recent Labs Lab 08/02/14 0031 08/02/14 0041 08/02/14 0556  WBC 7.0  --  5.4  NEUTROABS 3.9  --   --   HGB 14.0 15.0 13.3  HCT 42.1 44.0 41.0  MCV 87.3  --  87.2  PLT 163  --  149*   Cardiac Enzymes:  Recent Labs Lab 08/02/14 0031 08/02/14 0556 08/03/14 0520  CKTOTAL 1622* 1236* 685*   BNP (last 3 results)  Recent Labs  03/25/14 1022  PROBNP 95.7    Recent Results (from the past 240 hour(s))  MRSA PCR SCREENING     Status: None   Collection Time    08/02/14  3:46 AM      Result Value Ref Range Status   MRSA by PCR NEGATIVE  NEGATIVE Final   Comment:            The  GeneXpert MRSA Assay (FDA     approved for NASAL specimens     only), is one component of a     comprehensive MRSA colonization     surveillance program. It is not     intended to diagnose MRSA     infection nor to guide or     monitor treatment for     MRSA infections.     Studies:  Recent x-ray studies have been reviewed in detail by the Attending Physician  Scheduled Meds:  Scheduled Meds: . enoxaparin (LOVENOX) injection  50 mg Subcutaneous Q24H    Time spent on care of this patient: 35 mins   Randye Treichler T , MD   [facility] Office  [phone] Pager - Text Page per Shea Evans as per below:  On-Call/Text Page:      [email or web address]      password [email or web address]  If 7PM-7AM, please contact night-coverage [email or web address] Password [email or web address] 08/03/2014, 1:41 PM   LOS: 2 days

## 2014-08-03 NOTE — Progress Notes (Signed)
Nutrition Brief Note  Patient identified on the Malnutrition Screening Tool (MST) Report  Wt Readings from Last 15 Encounters:  08/02/14 224 lb 3.3 oz (101.7 kg)  03/27/14 225 lb 8.5 oz (102.3 kg)  03/27/13 134 lb (60.782 kg)    Body mass index is 43.79 kg/(m^2). Patient meets criteria for morbid obesity based on current BMI.   Current diet order is heart healthy, patient is consuming approximately 75% of meals at this time. Labs and medications reviewed.   - Pt reports that she has recently lost weight due to "cutting back" intentionally. She says that she was unhappy with her weight when she reached 248 lbs. Her appetite is good.  No nutrition interventions warranted at this time. If nutrition issues arise, please consult RD.   Terrace Arabia RD, LDN

## 2014-08-04 LAB — COMPREHENSIVE METABOLIC PANEL
ALBUMIN: 2.9 g/dL — AB (ref 3.5–5.2)
ALT: 35 U/L (ref 0–35)
AST: 31 U/L (ref 0–37)
Alkaline Phosphatase: 101 U/L (ref 39–117)
Anion gap: 8 (ref 5–15)
BUN: 13 mg/dL (ref 6–23)
CALCIUM: 9.1 mg/dL (ref 8.4–10.5)
CHLORIDE: 105 meq/L (ref 96–112)
CO2: 26 mEq/L (ref 19–32)
CREATININE: 0.84 mg/dL (ref 0.50–1.10)
GFR calc Af Amer: 90 mL/min — ABNORMAL LOW (ref >90)
GFR calc non Af Amer: 77 mL/min — ABNORMAL LOW (ref >90)
Glucose, Bld: 96 mg/dL (ref 70–99)
Potassium: 4.8 mEq/L (ref 3.7–5.3)
Sodium: 139 mEq/L (ref 137–147)
Total Bilirubin: 0.3 mg/dL (ref 0.3–1.2)
Total Protein: 6.1 g/dL (ref 6.0–8.3)

## 2014-08-04 LAB — CBC
HCT: 40.3 % (ref 36.0–46.0)
Hemoglobin: 13.1 g/dL (ref 12.0–15.0)
MCH: 28.4 pg (ref 26.0–34.0)
MCHC: 32.5 g/dL (ref 30.0–36.0)
MCV: 87.2 fL (ref 78.0–100.0)
Platelets: 148 10*3/uL — ABNORMAL LOW (ref 150–400)
RBC: 4.62 MIL/uL (ref 3.87–5.11)
RDW: 13.6 % (ref 11.5–15.5)
WBC: 3.7 10*3/uL — ABNORMAL LOW (ref 4.0–10.5)

## 2014-08-04 LAB — CK: CK TOTAL: 368 U/L — AB (ref 7–177)

## 2014-08-04 MED ORDER — HYDROCODONE-ACETAMINOPHEN 5-325 MG PO TABS: 1.0000 | ORAL_TABLET | Freq: Four times a day (QID) | ORAL | Status: DC | PRN

## 2014-08-04 NOTE — Evaluation (Signed)
Physical Therapy Evaluation Patient Details Name: Amanda Christensen MRN: [id number] DOB: [birth year] Today's Date: 08/04/2014   History of Present Illness  pt presents with overall weakness and Rhabdomyolisis.    Clinical Impression  Pt does not require physical A for mobility, but states she feels "discombobulated" though unable to clarify what that meant.  Feel pt will have good A from family and Aide at D/C and no further PT needs at this time.  Will sign off.      Follow Up Recommendations No PT follow up;Supervision - Intermittent    Equipment Recommendations  None recommended by PT    Recommendations for Other Services       Precautions / Restrictions Precautions Precautions: Fall Restrictions Weight Bearing Restrictions: No      Mobility  Bed Mobility Overal bed mobility: Modified Independent                Transfers Overall transfer level: Needs assistance Equipment used: None Transfers: Sit to/from Stand Sit to Stand: Min guard         General transfer comment: No physical A needed, but guarding 2/2 pt moving slowly and seems nervous.    Ambulation/Gait Ambulation/Gait assistance: Min guard Ambulation Distance (Feet): 75 Feet Assistive device: None Gait Pattern/deviations: Step-through pattern;Decreased stride length     General Gait Details: pt moving slowly and guarded.  pt indicates feeling as if she is "discombobulated", though had difficulty describing when asked what that meant.    Stairs            Wheelchair Mobility    Modified Rankin (Stroke Patients Only)       Balance                                             Pertinent Vitals/Pain Pain Assessment: No/denies pain    Home Living Family/patient expects to be discharged to:: Private residence Living Arrangements: Children (Daughter) Available Help at Discharge: Family;Available PRN/intermittently;Personal care attendant (Aide 2-3hrs 7days per  week.) Type of Home: House Home Access: Stairs to enter Entrance Stairs-Rails: None Entrance Stairs-Number of Steps: 1 Home Layout: One level Home Equipment: Walker - 2 wheels;Cane - single point      Prior Function Level of Independence: Needs assistance   Gait / Transfers Assistance Needed: pt notes she occasionally uses a cane or RW if she feels she needs one.    ADL's / Homemaking Assistance Needed: pt recently got [place] performs chores and some cooking tasks.  pt states she performs personal tasks.          Hand Dominance        Extremity/Trunk Assessment   Upper Extremity Assessment: Defer to OT evaluation           Lower Extremity Assessment: Generalized weakness      Cervical / Trunk Assessment: Normal  Communication   Communication: No difficulties  Cognition Arousal/Alertness: Awake/alert Behavior During Therapy: WFL for tasks assessed/performed Overall Cognitive Status: Within Functional Limits for tasks assessed                      General Comments      Exercises        Assessment/Plan    PT Assessment Patent does not need any further PT services  PT Diagnosis     PT Problem List  PT Treatment Interventions     PT Goals (Current goals can be found in the Care Plan section) Acute Rehab PT Goals PT Goal Formulation: No goals set, d/c therapy    Frequency     Barriers to discharge        Co-evaluation               End of Session Equipment Utilized During Treatment: Gait belt Activity Tolerance: Patient tolerated treatment well Patient left: in chair;with call bell/phone within reach Nurse Communication: Mobility status         Time: 1206-1223 PT Time Calculation (min): 17 min   Charges:   PT Evaluation $Initial PT Evaluation Tier I: 1 Procedure PT Treatments $Gait Training: 8-22 mins   PT G CodesCatarina Hartshorn, [place] 08/04/2014, 3:20 PM

## 2014-08-04 NOTE — Discharge Summary (Signed)
Amanda Christensen, is a 53 y.o. female  DOB 05/16/1961  MRN [id number].  Admission date:  08/01/2014  Admitting Physician  Theressa Millard, MD  Discharge Date:  08/04/2014   Primary MD  Elizabeth Palau, MD  Recommendations for primary care physician for things to follow:   Check CBC, CMP him a lactic acid and CK level in 5-7 days   Admission Diagnosis  Other specified hypotension [458.8] Lactic acidosis [276.2] Muscle pain [729.1] Hyperlipidemia [272.4] Asthma, mild intermittent, uncomplicated [220.25] Non-traumatic rhabdomyolysis [728.88] Primary osteoarthritis involving multiple joints [715.09] Hypotension, unspecified hypotension type [458.9] Acute renal failure, unspecified acute renal failure type [584.9]   Discharge Diagnosis  Other specified hypotension [458.8] Lactic acidosis [276.2] Muscle pain [729.1] Hyperlipidemia [272.4] Asthma, mild intermittent, uncomplicated [427.06] Non-traumatic rhabdomyolysis [728.88] Primary osteoarthritis involving multiple joints [715.09] Hypotension, unspecified hypotension type [458.9] Acute renal failure, unspecified acute renal failure type [584.9]    Principal Problem:   Rhabdomyolysis Active Problems:   Osteoarthritis   Acute renal failure   Hypotension   Lactic acidosis   Hyperlipidemia   Asthma      Past Medical History  Diagnosis Date  . Hypertension   . Depression   . Arthritis   . COPD (chronic obstructive pulmonary disease)   . Asthma   . Heart murmur   . Seizures     was on medications  . 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 Hernia repair    . Cesarean section    . Total knee arthroplasty Right   . Rotator cuff repair Left   . Joint replacement           History of present illness and  Hospital Course:     Kindly see H&P for history of present illness and admission details, please review complete Labs, Consult reports and Test reports for all details in brief  HPI    53 y.o. female with a history of Asthma, HTN, and Hyperlipidemia who presented to the ED with complaints of increased muscle spasms and generalized weakness over 2 weeks. She denied any fevers or chills but reported nausea. She was seen in the ED on 09/08 for her symptoms and given an Rx for Ativan which made her feel like she was in a trance so she did not take any more of it. She was re-evaluated in the ED on the day of her admit and was found to have an elevated CPK level of 1622, and a lactic acid level of 3.02 along with an elevated BUN/Cr of 26/2.00. She was also found to have hypotension.     Hospital Course    1. Dehydration, generalized weakness, hypotension, acute renal failure along with rhabdomyolysis due to combination of statin, NSAID use and ACE inhibitor use. Treated with IV fluids, pending medications were held with good results, blood pressure stable, not orthostatic, ambulated in the room without any symptoms, renal  function normal with much improved CK levels. Will be discharged home. Continue to hold offending medications request PCP to check CBC, CMP, lactic acid and CK level in a week.   2. Mild transaminitis. Due to rhabdo. Resolved.    3. Mildly elevated lactate on admission due to dehydration. Clinically nontoxic, repeat in one week. Bicarbonate stable at 26 today.    4. Essential hypertension. Continue on verapamil.     Discharge Condition: stable   Follow UP  Follow-up Information   Follow up with Elizabeth Palau, MD. Schedule an appointment as soon as possible for a visit in 1 week.   Specialty:  Family Medicine   Contact information:   [address] [place] [id number] [phone]          Discharge Instructions  and  Discharge Medications      Discharge Instructions   Discharge instructions    Complete by:  As directed   Follow with Primary MD Elizabeth Palau, MD in 7 days   Get CBC, CMP, CK,  2 view Chest X ray checked  by Primary MD next visit.    Activity: As tolerated with Full fall precautions use walker/cane & assistance as needed   Disposition Home     Diet: Heart Healthy    For Heart failure patients - Check your Weight same time everyday, if you gain over 2 pounds, or you develop in leg swelling, experience more shortness of breath or chest pain, call your Primary MD immediately. Follow Cardiac Low Salt Diet and 1.8 lit/day fluid restriction.   On your next visit with her primary care physician please Get Medicines reviewed and adjusted.  Please request your Prim.MD to go over all Hospital Tests and Procedure/Radiological results at the follow up, please get all Hospital records sent to your Prim MD by signing hospital release before you go home.   If you experience worsening of your admission symptoms, develop shortness of breath, life threatening emergency, suicidal or homicidal thoughts you must seek medical attention immediately by calling 911 or calling your MD immediately  if symptoms less severe.  You Must read complete instructions/literature along with all the possible adverse reactions/side effects for all the Medicines you take and that have been prescribed to you. Take any new Medicines after you have completely understood and accpet all the possible adverse reactions/side effects.   Do not drive, operating heavy machinery, perform activities at heights, swimming or participation in water activities or provide baby sitting services if your were admitted for syncope or siezures until you have seen by Primary MD or a Neurologist and advised to do so again.  Do not drive when taking Pain medications.    Do not take more than prescribed  Pain, Sleep and Anxiety Medications  Special Instructions: If you have smoked or chewed Tobacco  in the last 2 yrs please stop smoking, stop any regular Alcohol  and or any Recreational drug use.  Wear Seat belts while driving.   Please note  You were cared for by a hospitalist during your hospital stay. If you have any questions about your discharge medications or the care you received while you were in the hospital after you are discharged, you can call the unit and asked to speak with the hospitalist on call if the hospitalist that took care of you is not available. Once you are discharged, your primary care physician will handle any further medical issues. Please note that NO REFILLS for any  discharge medications will be authorized once you are discharged, as it is imperative that you return to your primary care physician (or establish a relationship with a primary care physician if you do not have one) for your aftercare needs so that they can reassess your need for medications and monitor your lab values.     Increase activity slowly    Complete by:  As directed             Medication List    STOP taking these medications       lisinopril 10 MG tablet  Commonly known as:  PRINIVIL,ZESTRIL     salsalate 750 MG tablet  Commonly known as:  DISALCID     simvastatin 20 MG tablet  Commonly known as:  ZOCOR      TAKE these medications       albuterol 108 (90 BASE) MCG/ACT inhaler  Commonly known as:  PROVENTIL HFA;VENTOLIN HFA  Inhale 2 puffs into the lungs every 6 (six) hours as needed for wheezing or shortness of breath.     cetirizine 10 MG tablet  Commonly known as:  ZYRTEC  Take 10 mg by mouth at bedtime.     cyclobenzaprine 10 MG tablet  Commonly known as:  FLEXERIL  Take 10 mg by mouth 3 (three) times daily as needed for muscle spasms.     diclofenac sodium 1 % Gel  Commonly known as:  VOLTAREN  Apply 2 g topically daily as needed (muscle pain).     fluticasone  50 MCG/ACT nasal spray  Commonly known as:  FLONASE  Place 1 spray into both nostrils daily as needed for allergies or rhinitis.     HYDROcodone-acetaminophen 5-325 MG per tablet  Commonly known as:  NORCO/VICODIN  Take 1 tablet by mouth every 6 (six) hours as needed for moderate pain.     LORazepam 1 MG tablet  Commonly known as:  ATIVAN  Take 1 tablet (1 mg total) by mouth every 8 (eight) hours as needed (muscle spasm).     terconazole 0.4 % vaginal cream  Commonly known as:  TERAZOL 7  Place 1 applicator vaginally at bedtime.     traZODone 100 MG tablet  Commonly known as:  DESYREL  Take 100 mg by mouth at bedtime.     verapamil 240 MG 24 hr capsule  Commonly known as:  VERELAN PM  Take 240 mg by mouth at bedtime.          Diet and Activity recommendation: See Discharge Instructions above   Consults obtained - none   Major procedures and Radiology Reports - PLEASE review detailed and final reports for all details, in brief -   none   No results found.  Micro Results      Recent Results (from the past 240 hour(s))  MRSA PCR SCREENING     Status: None   Collection Time    08/02/14  3:46 AM      Result Value Ref Range Status   MRSA by PCR NEGATIVE  NEGATIVE Final   Comment:            The GeneXpert MRSA Assay (FDA     approved for NASAL specimens     only), is one component of a     comprehensive MRSA colonization     surveillance program. It is not     intended to diagnose MRSA     infection nor to guide or     monitor treatment  for     MRSA infections.       Today   Subjective:   Monic Engelmann today has no headache,no chest abdominal pain,no new weakness tingling or numbness, feels much better wants to go home today.    Objective:   Blood pressure 114/64, pulse 76, temperature 98 F (36.7 C), temperature source Oral, resp. rate 16, height 5' (1.524 m), weight 108.2 kg (238 lb 8.6 oz), SpO2 100.00%.   Intake/Output Summary (Last 24 hours)  at 08/04/14 1138 Last data filed at 08/04/14 0230  Gross per 24 hour  Intake    240 ml  Output   2185 ml  Net  -1945 ml    Exam Awake Alert, Oriented x 3, No new F.N deficits, Normal affect [place].AT,PERRAL Supple Neck,No JVD, No cervical lymphadenopathy appriciated.  Symmetrical Chest wall movement, Good air movement bilaterally, CTAB RRR,No Gallops,Rubs or new Murmurs, No Parasternal Heave +ve B.Sounds, Abd Soft, Non tender, No organomegaly appriciated, No rebound -guarding or rigidity. No Cyanosis, Clubbing or edema, No new Rash or bruise  Data Review   CBC w Diff: Lab Results  Component Value Date   WBC 3.7* 08/04/2014   HGB 13.1 08/04/2014   HCT 40.3 08/04/2014   PLT 148* 08/04/2014   LYMPHOPCT 27 08/02/2014   MONOPCT 16* 08/02/2014   EOSPCT 1 08/02/2014   BASOPCT 0 08/02/2014    CMP: Lab Results  Component Value Date   NA 139 08/04/2014   K 4.8 08/04/2014   CL 105 08/04/2014   CO2 26 08/04/2014   BUN 13 08/04/2014   CREATININE 0.84 08/04/2014   PROT 6.1 08/04/2014   ALBUMIN 2.9* 08/04/2014   BILITOT 0.3 08/04/2014   ALKPHOS 101 08/04/2014   AST 31 08/04/2014   ALT 35 08/04/2014  .   Total Time in preparing paper work, data evaluation and todays exam - 35 minutes  Thurnell Lose M.D on 08/04/2014 at 11:38 AM  [facility] Office  [phone]   **Disclaimer: This note may have been dictated with voice recognition software. Similar sounding words can inadvertently be transcribed and this note may contain transcription errors which may not have been corrected upon publication of note.**

## 2014-08-04 NOTE — Discharge Instructions (Signed)
Follow with Primary MD Elizabeth Palau, MD in 7 days   Get CBC, CMP, CK,  2 view Chest X ray checked  by Primary MD next visit.    Activity: As tolerated with Full fall precautions use walker/cane & assistance as needed   Disposition Home     Diet: Heart Healthy    For Heart failure patients - Check your Weight same time everyday, if you gain over 2 pounds, or you develop in leg swelling, experience more shortness of breath or chest pain, call your Primary MD immediately. Follow Cardiac Low Salt Diet and 1.8 lit/day fluid restriction.   On your next visit with her primary care physician please Get Medicines reviewed and adjusted.  Please request your Prim.MD to go over all Hospital Tests and Procedure/Radiological results at the follow up, please get all Hospital records sent to your Prim MD by signing hospital release before you go home.   If you experience worsening of your admission symptoms, develop shortness of breath, life threatening emergency, suicidal or homicidal thoughts you must seek medical attention immediately by calling 911 or calling your MD immediately  if symptoms less severe.  You Must read complete instructions/literature along with all the possible adverse reactions/side effects for all the Medicines you take and that have been prescribed to you. Take any new Medicines after you have completely understood and accpet all the possible adverse reactions/side effects.   Do not drive, operating heavy machinery, perform activities at heights, swimming or participation in water activities or provide baby sitting services if your were admitted for syncope or siezures until you have seen by Primary MD or a Neurologist and advised to do so again.  Do not drive when taking Pain medications.    Do not take more than prescribed Pain, Sleep and Anxiety Medications  Special Instructions: If you have smoked or chewed Tobacco  in the last 2 yrs please stop smoking, stop any  regular Alcohol  and or any Recreational drug use.  Wear Seat belts while driving.   Please note  You were cared for by a hospitalist during your hospital stay. If you have any questions about your discharge medications or the care you received while you were in the hospital after you are discharged, you can call the unit and asked to speak with the hospitalist on call if the hospitalist that took care of you is not available. Once you are discharged, your primary care physician will handle any further medical issues. Please note that NO REFILLS for any discharge medications will be authorized once you are discharged, as it is imperative that you return to your primary care physician (or establish a relationship with a primary care physician if you do not have one) for your aftercare needs so that they can reassess your need for medications and monitor your lab values.

## 2014-08-05 LAB — URINE CULTURE
Colony Count: NO GROWTH
Culture: NO GROWTH

## 2014-08-10 ENCOUNTER — Other Ambulatory Visit: Payer: Self-pay | Admitting: Nurse Practitioner

## 2014-08-10 ENCOUNTER — Ambulatory Visit
Admission: RE | Admit: 2014-08-10 | Discharge: 2014-08-10 | Disposition: A | Discharge status: Home / Self Care | Payer: Medicaid Other | Source: Ambulatory Visit | Attending: Nurse Practitioner | Admitting: Nurse Practitioner

## 2014-08-10 DIAGNOSIS — J438 Other emphysema: Secondary | ICD-10-CM

## 2014-11-24 ENCOUNTER — Emergency Department (HOSPITAL_COMMUNITY): Payer: Medicaid Other

## 2014-11-24 ENCOUNTER — Emergency Department (HOSPITAL_COMMUNITY)
Admission: EM | Admit: 2014-11-24 | Discharge: 2014-11-24 | Disposition: A | Discharge status: Home / Self Care | Payer: Medicaid Other | Attending: Emergency Medicine | Admitting: Emergency Medicine

## 2014-11-24 ENCOUNTER — Encounter (HOSPITAL_COMMUNITY): Payer: Self-pay

## 2014-11-24 DIAGNOSIS — M791 Myalgia: Secondary | ICD-10-CM | POA: Insufficient documentation

## 2014-11-24 DIAGNOSIS — Z880 Allergy status to penicillin: Secondary | ICD-10-CM | POA: Insufficient documentation

## 2014-11-24 DIAGNOSIS — R011 Cardiac murmur, unspecified: Secondary | ICD-10-CM | POA: Insufficient documentation

## 2014-11-24 DIAGNOSIS — I10 Essential (primary) hypertension: Secondary | ICD-10-CM | POA: Insufficient documentation

## 2014-11-24 DIAGNOSIS — J449 Chronic obstructive pulmonary disease, unspecified: Secondary | ICD-10-CM | POA: Diagnosis not present

## 2014-11-24 DIAGNOSIS — Z87891 Personal history of nicotine dependence: Secondary | ICD-10-CM | POA: Insufficient documentation

## 2014-11-24 DIAGNOSIS — Z79899 Other long term (current) drug therapy: Secondary | ICD-10-CM | POA: Diagnosis not present

## 2014-11-24 DIAGNOSIS — R05 Cough: Secondary | ICD-10-CM | POA: Insufficient documentation

## 2014-11-24 DIAGNOSIS — J029 Acute pharyngitis, unspecified: Secondary | ICD-10-CM | POA: Diagnosis not present

## 2014-11-24 DIAGNOSIS — R509 Fever, unspecified: Secondary | ICD-10-CM | POA: Insufficient documentation

## 2014-11-24 DIAGNOSIS — Z86718 Personal history of other venous thrombosis and embolism: Secondary | ICD-10-CM | POA: Insufficient documentation

## 2014-11-24 DIAGNOSIS — J0100 Acute maxillary sinusitis, unspecified: Secondary | ICD-10-CM | POA: Diagnosis not present

## 2014-11-24 DIAGNOSIS — F329 Major depressive disorder, single episode, unspecified: Secondary | ICD-10-CM | POA: Diagnosis not present

## 2014-11-24 DIAGNOSIS — R059 Cough, unspecified: Secondary | ICD-10-CM

## 2014-11-24 DIAGNOSIS — Z7951 Long term (current) use of inhaled steroids: Secondary | ICD-10-CM | POA: Insufficient documentation

## 2014-11-24 DIAGNOSIS — R0981 Nasal congestion: Secondary | ICD-10-CM | POA: Diagnosis present

## 2014-11-24 DIAGNOSIS — M1990 Unspecified osteoarthritis, unspecified site: Secondary | ICD-10-CM | POA: Diagnosis not present

## 2014-11-24 MED ORDER — OXYCODONE-ACETAMINOPHEN 5-325 MG PO TABS: 2.0000 | ORAL_TABLET | ORAL | Status: DC | PRN

## 2014-11-24 MED ORDER — SULFAMETHOXAZOLE-TRIMETHOPRIM 800-160 MG PO TABS: 1.0000 | ORAL_TABLET | Freq: Two times a day (BID) | ORAL | Status: DC

## 2014-11-24 MED ORDER — DEXTROMETHORPHAN POLISTIREX 30 MG/5ML PO LQCR: 30.0000 mg | ORAL | Status: DC | PRN

## 2014-11-24 MED ORDER — OXYCODONE-ACETAMINOPHEN 5-325 MG PO TABS
2.0000 | ORAL_TABLET | Freq: Once | ORAL | Status: AC
  Administered 2014-11-24: 2 via ORAL
  Filled 2014-11-24: qty 2

## 2014-11-24 NOTE — ED Notes (Signed)
PA at bedside.

## 2014-11-24 NOTE — Discharge Instructions (Signed)
Take bactrim as directed until gone. Refer to attached documents for more information. Follow up with your doctor. Return to the ED with worsening or concerning symptoms.

## 2014-11-24 NOTE — ED Provider Notes (Signed)
CSN: [id number]     Arrival date & time 11/24/14  1136 History  This chart was scribed for non-physician practitioner, Alvina Chou, PA-C, working with Virgel Manifold, MD, by Stephania Fragmin, ED Scribe. This patient was seen in room TR06C/TR06C and the patient's care was started at 1:28 PM.    Chief Complaint  Patient presents with  . Cough  . Nasal Congestion  . Generalized Body Aches   The history is provided by the patient. No language interpreter was used.    HPI Comments: Amanda Christensen is a 54 y.o. female with cold symptoms beginning one week ago, starting with a sore throat and followed by dry cough and generalized body aches. She endorses nasal congestion. Patient has tried Copywriter, advertising Plus, daytime and nighttime, with no relief. She states she has had sick contact with her grandson one week ago, and her husband who started to have dry cough.   Past Medical History  Diagnosis Date  . Hypertension   . Depression   . Arthritis   . COPD (chronic obstructive pulmonary disease)   . Asthma   . Heart murmur   . Seizures     was on medications  . 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 Hernia repair    . Cesarean section    . Total knee arthroplasty Right   . Rotator cuff repair Left   . Joint replacement     Family History  Problem Relation Age of Onset  . Hypertension      family history  . Cancer      family history  . Drug abuse      family history  . Other      family history of psychiatric problems, disabilities  . Cancer Father    History  Substance Use Topics  . Smoking status: Former Smoker    Quit date: 11/20/2008  . Smokeless tobacco: Former Systems developer  . Alcohol Use: No   OB History    No data available     Review of Systems  HENT: Positive for congestion and sore throat.   Respiratory: Positive for cough.   Musculoskeletal: Positive for myalgias.  All other  systems reviewed and are negative.     Allergies  Penicillins and Tomato  Home Medications   Prior to Admission medications   Medication Sig Start Date End Date Taking? Authorizing Provider  albuterol (PROVENTIL HFA;VENTOLIN HFA) 108 (90 BASE) MCG/ACT inhaler Inhale 2 puffs into the lungs every 6 (six) hours as needed for wheezing or shortness of breath.     Historical Provider, MD  cetirizine (ZYRTEC) 10 MG tablet Take 10 mg by mouth at bedtime.     Historical Provider, MD  cyclobenzaprine (FLEXERIL) 10 MG tablet Take 10 mg by mouth 3 (three) times daily as needed for muscle spasms.    Historical Provider, MD  diclofenac sodium (VOLTAREN) 1 % GEL Apply 2 g topically daily as needed (muscle pain).     Historical Provider, MD  fluticasone (FLONASE) 50 MCG/ACT nasal spray Place 1 spray into both nostrils daily as needed for allergies or rhinitis.    Historical Provider, MD  HYDROcodone-acetaminophen (NORCO/VICODIN) 5-325 MG per tablet Take 1 tablet by mouth every 6 (six) hours as needed for moderate pain. 08/04/14   Thurnell Lose, MD  LORazepam (ATIVAN) 1 MG tablet Take 1 tablet (1 mg total) by  mouth every 8 (eight) hours as needed (muscle spasm). 07/28/14   Virgel Manifold, MD  terconazole (TERAZOL 7) 0.4 % vaginal cream Place 1 applicator vaginally at bedtime.    Historical Provider, MD  traZODone (DESYREL) 100 MG tablet Take 100 mg by mouth at bedtime.     Historical Provider, MD  verapamil (VERELAN PM) 240 MG 24 hr capsule Take 240 mg by mouth at bedtime.     Historical Provider, MD   BP 140/78 mmHg  Pulse 64  Temp(Src) 97.4 F (36.3 C) (Oral)  Resp 16  Ht 5' (1.524 m)  Wt 222 lb (100.699 kg)  BMI 43.36 kg/m2  SpO2 100% Physical Exam  Constitutional: She is oriented to person, place, and time. She appears well-developed and well-nourished. No distress.  HENT:  Head: Normocephalic and atraumatic.  Mouth/Throat: No oropharyngeal exudate.  Eyes: Conjunctivae and EOM are normal.   Neck: Neck supple. No tracheal deviation present.  Cardiovascular: Normal rate.   Pulmonary/Chest: Effort normal. No respiratory distress.  Lungs are clear.  Musculoskeletal: Normal range of motion.  Neurological: She is alert and oriented to person, place, and time.  Skin: Skin is warm and dry.  Psychiatric: She has a normal mood and affect. Her behavior is normal.  Nursing note and vitals reviewed.   ED Course  Procedures (including critical care time)  DIAGNOSTIC STUDIES: Oxygen Saturation is 100% on room air, normal by my interpretation.    COORDINATION OF CARE: 1:31 PM-Discussed treatment plan which includes treatment for sinus infection with pt at bedside and pt agreed to plan.     Imaging Review Dg Chest 2 View  11/24/2014   CLINICAL DATA:  Dry cough starting last week, body ache  EXAM: CHEST  2 VIEW  COMPARISON:  None.  FINDINGS: Cardiomediastinal silhouette is stable. Again noted mild hyperinflation and probable chronic mild interstitial prominence. No acute infiltrate or pulmonary edema. Degenerative changes left shoulder again noted.  IMPRESSION: No active disease. Stable mild hyperinflation and chronic interstitial prominence. Degenerative changes left shoulder.   Electronically Signed   By: Lahoma Crocker M.D.   On: 11/24/2014 13:34    MDM   Final diagnoses:  Cough  Fever  Acute maxillary sinusitis, recurrence not specified   1:53 PM Chest xray unremarkable for acute changes. Vitals stable and patient afebrile. Patient likely has a sinus infection and will be treated with bactrim. No other concerning symptoms at this time. Patient instructed to return with worsening or concerning symptoms.   I personally performed the services described in this documentation, which was scribed in my presence. The recorded information has been reviewed and is accurate.      Alvina Chou, PA-C 11/24/14 [place], MD 11/25/14 [phone]

## 2014-11-24 NOTE — ED Notes (Signed)
Pt states that she has chronic pain hips, lower back, shoulders. Pt states that today she is feeling like she aches all over. Pt states that she is having chest pain, mostly when she coughs, and describes the pain as stiffness. Pt states that she feels like her head and nose are stopped up. Pt states that she had a sore throat 3 days ago, but she finally got rid of it.  Pt states that she was taking alkseltzer plus, day time and night time, but it was not helping.

## 2014-11-24 NOTE — ED Notes (Signed)
Pt states that she has had to take her inhaler because she feels like chest is "heavy".

## 2014-11-24 NOTE — ED Notes (Signed)
Pt here for cold, cough, and generalized body aches, denies any fever. Has been taking OTC meds but none has been helping.

## 2014-11-24 NOTE — ED Notes (Signed)
Pt returned via wheelchair from X-ray

## 2014-12-15 ENCOUNTER — Encounter (HOSPITAL_COMMUNITY): Payer: Self-pay | Admitting: *Deleted

## 2014-12-15 ENCOUNTER — Emergency Department (HOSPITAL_COMMUNITY): Payer: Medicaid Other

## 2014-12-15 ENCOUNTER — Emergency Department (HOSPITAL_COMMUNITY)
Admission: EM | Admit: 2014-12-15 | Discharge: 2014-12-15 | Disposition: A | Discharge status: Home / Self Care | Payer: Medicaid Other | Attending: Emergency Medicine | Admitting: Emergency Medicine

## 2014-12-15 DIAGNOSIS — R011 Cardiac murmur, unspecified: Secondary | ICD-10-CM | POA: Diagnosis not present

## 2014-12-15 DIAGNOSIS — Z87891 Personal history of nicotine dependence: Secondary | ICD-10-CM | POA: Insufficient documentation

## 2014-12-15 DIAGNOSIS — G8929 Other chronic pain: Secondary | ICD-10-CM | POA: Insufficient documentation

## 2014-12-15 DIAGNOSIS — Z880 Allergy status to penicillin: Secondary | ICD-10-CM | POA: Insufficient documentation

## 2014-12-15 DIAGNOSIS — E669 Obesity, unspecified: Secondary | ICD-10-CM | POA: Diagnosis not present

## 2014-12-15 DIAGNOSIS — I10 Essential (primary) hypertension: Secondary | ICD-10-CM | POA: Diagnosis not present

## 2014-12-15 DIAGNOSIS — F329 Major depressive disorder, single episode, unspecified: Secondary | ICD-10-CM | POA: Insufficient documentation

## 2014-12-15 DIAGNOSIS — J441 Chronic obstructive pulmonary disease with (acute) exacerbation: Secondary | ICD-10-CM | POA: Diagnosis not present

## 2014-12-15 DIAGNOSIS — M25461 Effusion, right knee: Secondary | ICD-10-CM | POA: Diagnosis not present

## 2014-12-15 DIAGNOSIS — G40909 Epilepsy, unspecified, not intractable, without status epilepticus: Secondary | ICD-10-CM | POA: Insufficient documentation

## 2014-12-15 DIAGNOSIS — R0602 Shortness of breath: Secondary | ICD-10-CM | POA: Diagnosis present

## 2014-12-15 DIAGNOSIS — M1990 Unspecified osteoarthritis, unspecified site: Secondary | ICD-10-CM | POA: Insufficient documentation

## 2014-12-15 DIAGNOSIS — Z86718 Personal history of other venous thrombosis and embolism: Secondary | ICD-10-CM | POA: Diagnosis not present

## 2014-12-15 DIAGNOSIS — Z79899 Other long term (current) drug therapy: Secondary | ICD-10-CM | POA: Diagnosis not present

## 2014-12-15 HISTORY — DX: Other chronic pain: G89.29

## 2014-12-15 HISTORY — DX: Obesity, unspecified: E66.9

## 2014-12-15 LAB — CBC
HCT: 44.7 % (ref 36.0–46.0)
Hemoglobin: 14.4 g/dL (ref 12.0–15.0)
MCH: 28.1 pg (ref 26.0–34.0)
MCHC: 32.2 g/dL (ref 30.0–36.0)
MCV: 87.3 fL (ref 78.0–100.0)
PLATELETS: 196 10*3/uL (ref 150–400)
RBC: 5.12 MIL/uL — ABNORMAL HIGH (ref 3.87–5.11)
RDW: 12.6 % (ref 11.5–15.5)
WBC: 4.5 10*3/uL (ref 4.0–10.5)

## 2014-12-15 LAB — BRAIN NATRIURETIC PEPTIDE: B Natriuretic Peptide: 9.4 pg/mL (ref 0.0–100.0)

## 2014-12-15 LAB — BASIC METABOLIC PANEL
Anion gap: 11 (ref 5–15)
BUN: 10 mg/dL (ref 6–23)
CHLORIDE: 105 mmol/L (ref 96–112)
CO2: 26 mmol/L (ref 19–32)
Calcium: 10.1 mg/dL (ref 8.4–10.5)
Creatinine, Ser: 0.93 mg/dL (ref 0.50–1.10)
GFR calc Af Amer: 79 mL/min — ABNORMAL LOW (ref >90)
GFR calc non Af Amer: 68 mL/min — ABNORMAL LOW (ref >90)
Glucose, Bld: 120 mg/dL — ABNORMAL HIGH (ref 70–99)
Potassium: 3.8 mmol/L (ref 3.5–5.1)
Sodium: 142 mmol/L (ref 135–145)

## 2014-12-15 LAB — I-STAT TROPONIN, ED
TROPONIN I, POC: 0 ng/mL (ref 0.00–0.08)
Troponin i, poc: 0.01 ng/mL (ref 0.00–0.08)

## 2014-12-15 LAB — CK: Total CK: 105 U/L (ref 7–177)

## 2014-12-15 LAB — D-DIMER, QUANTITATIVE: D-Dimer, Quant: 0.29 ug/mL-FEU (ref 0.00–0.48)

## 2014-12-15 MED ORDER — ALBUTEROL SULFATE HFA 108 (90 BASE) MCG/ACT IN AERS: 1.0000 | INHALATION_SPRAY | Freq: Four times a day (QID) | RESPIRATORY_TRACT | Status: AC | PRN

## 2014-12-15 MED ORDER — CYCLOBENZAPRINE HCL 10 MG PO TABS
5.0000 mg | ORAL_TABLET | Freq: Once | ORAL | Status: AC
  Administered 2014-12-15: 5 mg via ORAL
  Filled 2014-12-15: qty 1

## 2014-12-15 MED ORDER — CYCLOBENZAPRINE HCL 5 MG PO TABS: 5.0000 mg | ORAL_TABLET | Freq: Three times a day (TID) | ORAL | Status: DC | PRN

## 2014-12-15 MED ORDER — PREDNISONE 10 MG PO TABS: 30.0000 mg | ORAL_TABLET | Freq: Every day | ORAL | Status: DC

## 2014-12-15 MED ORDER — OXYCODONE-ACETAMINOPHEN 5-325 MG PO TABS
1.0000 | ORAL_TABLET | Freq: Once | ORAL | Status: AC
  Administered 2014-12-15: 1 via ORAL
  Filled 2014-12-15: qty 1

## 2014-12-15 NOTE — ED Notes (Signed)
Lab at bedside

## 2014-12-15 NOTE — ED Notes (Signed)
Pt reports going to pcp for check up today, was sent here due to chest heaviness and sob. occ and also to r/o dvt. ekg done at triage and airway intact,.

## 2014-12-15 NOTE — ED Notes (Signed)
MD at bedside. 

## 2014-12-15 NOTE — ED Provider Notes (Signed)
CSN: [id number]     Arrival date & time 12/15/14  1133 History   First MD Initiated Contact with Patient 12/15/14 1622     Chief Complaint  Patient presents with  . Shortness of Breath  . Chest Pain     Patient is a 54 y.o. female presenting with shortness of breath and chest pain. The history is provided by the patient. No language interpreter was used.  Shortness of Breath Associated symptoms: chest pain   Chest Pain Associated symptoms: shortness of breath    [name] presents for evaluation of shortness of breath. She states that she has chronic shortness of breath but it has been worse over the last 4 days. Symptoms are intermittent, they can occur with activity or rest. She does have a nonproductive cough intermittently as well. She denies any fevers, vomiting, diarrhea. She did have an illness 5 or 6 days ago with vomiting diarrhea which has since resolved. 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 She is not currently on any anticoagulation. She also has muscle spasms. She has ongoing history of muscle spasms and these have been present in her jaw, neck, chest, and back. These muscle spasms or waxing and waning with no clear triggers. The muscle spasms in her chest feel like pressure and heaviness in her chest. Symptoms are moderate, intermittent, worsening. She saw her primary care physician today and they wanted her to get ruled out for DVT.  Past Medical History  Diagnosis Date  . Hypertension   . Depression   . Arthritis   . COPD (chronic obstructive pulmonary disease)   . Asthma   . Heart murmur   . Seizures     was on medications  . 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 Obesity   . Chronic pain    Past Surgical History  Procedure Laterality Date  . Hernia repair    . Cesarean section    . Total knee arthroplasty Right   . Rotator cuff repair Left   .  Joint replacement     Family History  Problem Relation Age of Onset  . Hypertension      family history  . Cancer      family history  . Drug abuse      family history  . Other      family history of psychiatric problems, disabilities  . Cancer Father    History  Substance Use Topics  . Smoking status: Former Smoker    Quit date: 11/20/2008  . Smokeless tobacco: Former Systems developer  . Alcohol Use: No   OB History    No data available     Review of Systems  Respiratory: Positive for shortness of breath.   Cardiovascular: Positive for chest pain.  All other systems reviewed and are negative.     Allergies  Penicillins and Tomato  Home Medications   Prior to Admission medications   Medication Sig Start Date End Date Taking? Authorizing Provider  albuterol (PROVENTIL HFA;VENTOLIN HFA) 108 (90 BASE) MCG/ACT inhaler Inhale 2 puffs into the lungs every 6 (six) hours as needed for wheezing or shortness of breath.     Historical Provider, MD  cetirizine (ZYRTEC) 10 MG tablet Take 10 mg by mouth at bedtime.     Historical Provider, MD  cyclobenzaprine (FLEXERIL) 10 MG tablet Take 10 mg  by mouth 3 (three) times daily as needed for muscle spasms.    Historical Provider, MD  dextromethorphan (DELSYM) 30 MG/5ML liquid Take 5 mLs (30 mg total) by mouth as needed for cough. 11/24/14   Kaitlyn Szekalski, PA-C  diclofenac sodium (VOLTAREN) 1 % GEL Apply 2 g topically daily as needed (muscle pain).     Historical Provider, MD  fluticasone (FLONASE) 50 MCG/ACT nasal spray Place 1 spray into both nostrils daily as needed for allergies or rhinitis.    Historical Provider, MD  HYDROcodone-acetaminophen (NORCO/VICODIN) 5-325 MG per tablet Take 1 tablet by mouth every 6 (six) hours as needed for moderate pain. 08/04/14   Thurnell Lose, MD  LORazepam (ATIVAN) 1 MG tablet Take 1 tablet (1 mg total) by mouth every 8 (eight) hours as needed (muscle spasm). 07/28/14   Virgel Manifold, MD   oxyCODONE-acetaminophen (PERCOCET/ROXICET) 5-325 MG per tablet Take 2 tablets by mouth every 4 (four) hours as needed for moderate pain or severe pain. 11/24/14   Kaitlyn Szekalski, PA-C  sulfamethoxazole-trimethoprim (SEPTRA DS) 800-160 MG per tablet Take 1 tablet by mouth every 12 (twelve) hours. 11/24/14   Kaitlyn Szekalski, PA-C  terconazole (TERAZOL 7) 0.4 % vaginal cream Place 1 applicator vaginally at bedtime.    Historical Provider, MD  traZODone (DESYREL) 100 MG tablet Take 100 mg by mouth at bedtime.     Historical Provider, MD  verapamil (VERELAN PM) 240 MG 24 hr capsule Take 240 mg by mouth at bedtime.     Historical Provider, MD   BP 144/88 mmHg  Pulse 86  Temp(Src) 97.9 F (36.6 C) (Oral)  Resp 18  SpO2 100% Physical Exam  Constitutional: She is oriented to person, place, and time. She appears well-developed and well-nourished.  HENT:  Head: Normocephalic and atraumatic.  Cardiovascular: Normal rate and regular rhythm.   No murmur heard. Pulmonary/Chest: Effort normal and breath sounds normal. No respiratory distress.  Abdominal: Soft. There is no tenderness. There is no rebound and no guarding.  Musculoskeletal:  Mild swelling and tenderness of the right knee without diffuse swelling of the right lower extremity.  Neurological: She is alert and oriented to person, place, and time.  Skin: Skin is warm and dry.  Psychiatric: She has a normal mood and affect. Her behavior is normal.  Nursing note and vitals reviewed.   ED Course  Procedures (including critical care time) Labs Review Labs Reviewed  CBC - Abnormal; Notable for the following:    RBC 5.12 (*)    All other components within normal limits  BASIC METABOLIC PANEL - Abnormal; Notable for the following:    Glucose, Bld 120 (*)    GFR calc non Af Amer 68 (*)    GFR calc Af Amer 79 (*)    All other components within normal limits  BRAIN NATRIURETIC PEPTIDE  CK  D-DIMER, QUANTITATIVE  I-STAT TROPOININ, ED   I-STAT TROPOININ, ED    Imaging Review Dg Chest 2 View  12/15/2014   CLINICAL DATA:  Chest pain and difficulty breathing for 1 week  EXAM: CHEST  2 VIEW  COMPARISON:  November 24, 2014 and August 10, 2014  FINDINGS: The interstitium is mildly prominent without edema or consolidation. Heart size and pulmonary vascularity are within normal limits. No adenopathy. No bone lesions.  IMPRESSION: Mild interstitial prominence, most likely reflecting chronic inflammatory type change. No frank edema or consolidation.   Electronically Signed   By: Lowella Grip M.D.   On: 12/15/2014 13:42  EKG Interpretation   Date/Time:  Tuesday December 15 2014 11:36:21 EST Ventricular Rate:  77 PR Interval:  138 QRS Duration: 76 QT Interval:  388 QTC Calculation: 439 R Axis:   43 Text Interpretation:  Normal sinus rhythm Nonspecific T wave abnormality  Abnormal ECG Confirmed by Hazle Coca [id number]) on 12/15/2014 4:32:36 PM      MDM   Final diagnoses:  Shortness of breath   patient here for evaluation of acute on chronic shortness of breath as well as muscle spasms. Terms of shortness of breath, lungs are clear with no respiratory distress on exam there is no evidence of acute COPD exacerbation at this time. Patient did state that she had a breathing treatment prior to ED arrival. Bleeding for possible mild COPD exacerbation wished trial of steroids and albuterol MDI with close pulmonary follow-up. There is no evidence of acute CHF, ACS, pneumonia. Extreme presentation is not consistent with PE or DVT, patient is slow to intermediate risk and d-dimer is negative, additional imaging is not warranted at this time. There is no evidence of rhabdomyolysis - CK checked given patient's history of rhabdomyolysis with muscle spasms previously. Discussed with patient importance of PCP follow-up as well as return precautions. Patient was given doses of Flexeril as well as prescription for Flexeril given her muscle  spasms.  Quintella Reichert, MD 12/15/14 [phone]

## 2014-12-15 NOTE — ED Notes (Signed)
Spoke with EDP flu 06/2014 and pna 03/2014.

## 2014-12-15 NOTE — Discharge Instructions (Signed)

## 2014-12-24 ENCOUNTER — Institutional Professional Consult (permissible substitution): Payer: Medicaid Other | Admitting: Internal Medicine

## 2014-12-28 ENCOUNTER — Other Ambulatory Visit (HOSPITAL_COMMUNITY): Payer: Self-pay | Admitting: *Deleted

## 2014-12-28 NOTE — Pre-Procedure Instructions (Addendum)
Amanda Christensen  12/28/2014   Your procedure is scheduled on:  Friday, January 08, 2015 at 7:30 AM.   Report to [facility] Entrance "A" Admitting Office at 5:30 AM.   Call this number if you have problems the morning of surgery: [phone]               Any questions prior to day of surgery, please call [phone] between 8 & 4 PM.    Remember:   Do not eat food or drink liquids after midnight Thursday, 01/07/15.   Take these medicines the morning of surgery with A SIP OF WATER: cyclobenzaprine (FLEXERIL) - if needed, Albuterol inhaler - if needed,( bring with you) Flonase - if needed, Hydrocodone - if needed.    STOP all herbel meds, nsaids (aleve,naproxen,advil,ibuprofen) 5 days prior to surgery starting 01/03/15 including vitamins, aspirin.   Do not wear jewelry, make-up or nail polish.  Do not wear lotions, powders, or perfumes. You NOT may wear deodorant.  Do not shave 48 hours prior to surgery.   Do not bring valuables to the hospital.  [facility] is not responsible                  for any belongings or valuables.               Contacts, dentures or bridgework may not be worn into surgery.  Leave suitcase in the car. After surgery it may be brought to your room.  For patients admitted to the hospital, discharge time is determined by your                treatment team.             Special Instructions: [place] - Preparing for Surgery  Before surgery, you can play an important role.  Because skin is not sterile, your skin needs to be as free of germs as possible.  You can reduce the number of germs on you skin by washing with CHG (chlorahexidine gluconate) soap before surgery.  CHG is an antiseptic cleaner which kills germs and bonds with the skin to continue killing germs even after washing.  Please DO NOT use if you have an allergy to CHG or antibacterial soaps.  If your skin becomes reddened/irritated stop using the CHG and inform your nurse when you arrive at  Short Stay.  Do not shave (including legs and underarms) for at least 48 hours prior to the first CHG shower.  You may shave your face.  Please follow these instructions carefully:   1.  Shower with CHG Soap the night before surgery and the                                morning of Surgery.  2.  If you choose to wash your hair, wash your hair first as usual with your       normal shampoo.  3.  After you shampoo, rinse your hair and body thoroughly to remove the                      Shampoo.  4.  Use CHG as you would any other liquid soap.  You can apply chg directly       to the skin and wash gently with scrungie or a clean washcloth.  5.  Apply the CHG Soap to your body ONLY FROM THE  NECK DOWN.        Do not use on open wounds or open sores.  Avoid contact with your eyes, ears, mouth and genitals (private parts).  Wash genitals (private parts) with your normal soap.  6.  Wash thoroughly, paying special attention to the area where your surgery        will be performed.  7.  Thoroughly rinse your body with warm water from the neck down.  8.  DO NOT shower/wash with your normal soap after using and rinsing off       the CHG Soap.  9.  Pat yourself dry with a clean towel.            10.  Wear clean pajamas.            11.  Place clean sheets on your bed the night of your first shower and do not        sleep with pets.  Day of Surgery  Do not apply any lotions/deodorants the morning of surgery.  Please wear clean clothes to the hospital.     Please read over the following fact sheets that you were given: Pain Booklet, Coughing and Deep Breathing, Blood Transfusion Information, MRSA Information and Surgical Site Infection Prevention

## 2014-12-29 ENCOUNTER — Encounter (HOSPITAL_COMMUNITY): Payer: Self-pay

## 2014-12-29 ENCOUNTER — Encounter (HOSPITAL_COMMUNITY)
Admission: RE | Admit: 2014-12-29 | Discharge: 2014-12-29 | Disposition: A | Discharge status: Home / Self Care | Payer: Medicaid Other | Source: Ambulatory Visit | Attending: Orthopedic Surgery | Admitting: Orthopedic Surgery

## 2014-12-29 DIAGNOSIS — Z0183 Encounter for blood typing: Secondary | ICD-10-CM | POA: Diagnosis not present

## 2014-12-29 DIAGNOSIS — J449 Chronic obstructive pulmonary disease, unspecified: Secondary | ICD-10-CM | POA: Diagnosis not present

## 2014-12-29 DIAGNOSIS — I10 Essential (primary) hypertension: Secondary | ICD-10-CM | POA: Diagnosis not present

## 2014-12-29 DIAGNOSIS — Z01812 Encounter for preprocedural laboratory examination: Secondary | ICD-10-CM | POA: Insufficient documentation

## 2014-12-29 DIAGNOSIS — M19012 Primary osteoarthritis, left shoulder: Secondary | ICD-10-CM | POA: Insufficient documentation

## 2014-12-29 DIAGNOSIS — I739 Peripheral vascular disease, unspecified: Secondary | ICD-10-CM | POA: Diagnosis not present

## 2014-12-29 DIAGNOSIS — Z87891 Personal history of nicotine dependence: Secondary | ICD-10-CM | POA: Insufficient documentation

## 2014-12-29 DIAGNOSIS — J45909 Unspecified asthma, uncomplicated: Secondary | ICD-10-CM | POA: Insufficient documentation

## 2014-12-29 DIAGNOSIS — Z86718 Personal history of other venous thrombosis and embolism: Secondary | ICD-10-CM | POA: Diagnosis not present

## 2014-12-29 HISTORY — DX: Reserved for inherently not codable concepts without codable children: IMO0001

## 2014-12-29 HISTORY — DX: Peripheral vascular disease, unspecified: I73.9

## 2014-12-29 LAB — CBC
HCT: 43.7 % (ref 36.0–46.0)
HEMOGLOBIN: 14.1 g/dL (ref 12.0–15.0)
MCH: 28.3 pg (ref 26.0–34.0)
MCHC: 32.3 g/dL (ref 30.0–36.0)
MCV: 87.6 fL (ref 78.0–100.0)
Platelets: 224 10*3/uL (ref 150–400)
RBC: 4.99 MIL/uL (ref 3.87–5.11)
RDW: 12.8 % (ref 11.5–15.5)
WBC: 3.5 10*3/uL — ABNORMAL LOW (ref 4.0–10.5)

## 2014-12-29 LAB — PROTIME-INR
INR: 0.97 (ref 0.00–1.49)
Prothrombin Time: 13 seconds (ref 11.6–15.2)

## 2014-12-29 LAB — BASIC METABOLIC PANEL
Anion gap: 8 (ref 5–15)
BUN: 9 mg/dL (ref 6–23)
CHLORIDE: 104 mmol/L (ref 96–112)
CO2: 27 mmol/L (ref 19–32)
Calcium: 9.9 mg/dL (ref 8.4–10.5)
Creatinine, Ser: 0.94 mg/dL (ref 0.50–1.10)
GFR calc non Af Amer: 67 mL/min — ABNORMAL LOW (ref >90)
GFR, EST AFRICAN AMERICAN: 78 mL/min — AB (ref >90)
Glucose, Bld: 106 mg/dL — ABNORMAL HIGH (ref 70–99)
POTASSIUM: 4 mmol/L (ref 3.5–5.1)
SODIUM: 139 mmol/L (ref 135–145)

## 2014-12-29 LAB — TYPE AND SCREEN
ABO/RH(D): O POS
ANTIBODY SCREEN: NEGATIVE

## 2014-12-29 LAB — APTT: aPTT: 30 seconds (ref 24–37)

## 2014-12-29 LAB — ABO/RH: ABO/RH(D): O POS

## 2014-12-29 LAB — SURGICAL PCR SCREEN
MRSA, PCR: NEGATIVE
Staphylococcus aureus: NEGATIVE

## 2014-12-29 NOTE — Progress Notes (Signed)
   12/29/14 1018  OBSTRUCTIVE SLEEP APNEA  Have you ever been diagnosed with sleep apnea through a sleep study? No  Do you snore loudly (loud enough to be heard through closed doors)?  0  Do you often feel tired, fatigued, or sleepy during the daytime? 0  Has anyone observed you stop breathing during your sleep? 1  Do you have, or are you being treated for high blood pressure? 1  BMI more than 35 kg/m2? 1  Age over 56 years old? 1  Neck circumference greater than 40 cm/16 inches? 0 (14)  Gender: 0  Obstructive Sleep Apnea Score 4  Score 4 or greater  Results sent to PCP

## 2014-12-29 NOTE — Progress Notes (Signed)
Patient had appt with pulmonary dr (unknown name) but canceled.

## 2014-12-29 NOTE — H&P (Signed)
Amanda Christensen is an 54 y.o. female.    Chief Complaint: left shoulder pain  HPI: Pt is a 54 y.o. female complaining of left shoulder pain for multiple years. Pain had continually increased since the beginning. X-rays in the clinic show end-stage arthritic changes of the left shoulder. Pt has tried various conservative treatments which have failed to alleviate their symptoms, including therapy and injections. Various options are discussed with the patient. Risks, benefits and expectations were discussed with the patient. Patient understand the risks, benefits and expectations and wishes to proceed with surgery.   PCP:  Elizabeth Palau, MD  D/C Plans:  Home with HHPT  PMH: Past Medical History  Diagnosis Date  . Hypertension   . Depression   . Arthritis   . COPD (chronic obstructive pulmonary disease)   . Asthma   . Heart murmur   . Seizures     was on medications  . 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 Obesity   . Chronic pain     PSH: Past Surgical History  Procedure Laterality Date  . Hernia repair    . Cesarean section    . Total knee arthroplasty Right   . Rotator cuff repair Left   . Joint replacement      Social History:  reports that she quit smoking about 6 years ago. She has quit using smokeless tobacco. She reports that she does not drink alcohol or use illicit drugs.  Allergies:  Allergies  Allergen Reactions  . Penicillins Other (See Comments)    syncope  . Tomato Hives    From acid in food    Medications: No current facility-administered medications for this encounter.   Current Outpatient Prescriptions  Medication Sig Dispense Refill  . albuterol (PROVENTIL HFA;VENTOLIN HFA) 108 (90 BASE) MCG/ACT inhaler Inhale 1-2 puffs into the lungs every 6 (six) hours as needed for wheezing or shortness of breath. 1 Inhaler 0  . Ascorbic Acid (VITAMIN C PO) Take 1 tablet by mouth daily.    . cetirizine (ZYRTEC)  10 MG tablet Take 10 mg by mouth at bedtime.     . Cholecalciferol (VITAMIN D PO) Take 1 tablet by mouth daily.    . cyclobenzaprine (FLEXERIL) 5 MG tablet Take 1 tablet (5 mg total) by mouth 3 (three) times daily as needed for muscle spasms. 12 tablet 0  . diclofenac sodium (VOLTAREN) 1 % GEL Apply 2 g topically daily as needed (muscle pain).     . fluticasone (FLONASE) 50 MCG/ACT nasal spray Place 1 spray into both nostrils daily as needed for allergies or rhinitis.    Marland Kitchen HYDROcodone-acetaminophen (NORCO/VICODIN) 5-325 MG per tablet Take 1 tablet by mouth daily.  0  . montelukast (SINGULAIR) 10 MG tablet Take 10 mg by mouth at bedtime.    . Multiple Vitamins-Minerals (MULTIVITAMIN PO) Take 1 tablet by mouth daily.    . verapamil (VERELAN PM) 240 MG 24 hr capsule Take 240 mg by mouth at bedtime.     . predniSONE (DELTASONE) 10 MG tablet Take 3 tablets (30 mg total) by mouth daily. (Patient not taking: Reported on 12/28/2014) 12 tablet 0  . traZODone (DESYREL) 100 MG tablet Take 100 mg by mouth at bedtime.      No results found for this or any previous visit (from the past 48 hour(s)). No results found.  ROS: Pain with rom of the left upper extremity  Physical Exam:  Alert and oriented 54 y.o. female in no acute distress Cranial nerves 2-12 intact Cervical spine: full rom with no tenderness, nv intact distally Chest: active breath sounds bilaterally, no wheeze rhonchi or rales Heart: regular rate and rhythm, no murmur Abd: non tender non distended with active bowel sounds Hip is stable with rom  Left shoulder with limited rom due to pain nv intact distally No rashes or edema Strength 4/5 with ER and IR  Assessment/Plan Assessment: left shoulder end stage osteoarthritis  Plan: Patient will undergo a left total shoulder arthroplasty by Dr. Veverly Fells at [facility] Risks benefits and expectations were discussed with the patient. Patient understand risks, benefits and expectations and  wishes to proceed.

## 2014-12-31 NOTE — Progress Notes (Signed)
Anesthesia Chart Review:  Pt is 54 year old female scheduled for L total shoulder arthroplasty on 01/08/2015 with Dr. Veverly Fells.   PMH includes: HTN, heart murmur, PVD, DVT, COPD, asthma, seizures. Former smoker.  BMI 42  Preoperative labs reviewed.    EKG 12/15/2014: NSR. Nonspecific T wave abnormality.   Nuclear stress test 03/27/2014: Reduced activity along the inferior wall on stress and rest images, diaphragmatic attenuation is favored over RCA distribution scar. Left ventricular rest ejection fraction is calculated at 74%  Echo 03/05/2007: - Overall left ventricular systolic function was normal. Left ventricular ejection fraction was estimated , range being 55% to 65%. There was no diagnostic evidence of left ventricular regional wall motion abnormalities. Left ventricular diastolic function parameters were normal. - There was trivial aortic valvular regurgitation. - There was mild mitral valvular regurgitation. - A tendinous band is noted in the mid-right ventricle. The estimated peak right ventricular systolic pressure was near the upper limit of normal.  If no changes, I anticipate pt can proceed with surgery as scheduled.   Amanda Kirsch, FNP-BC [facility]/Anesthesiology Phone: [phone] 12/31/2014 1:39 PM

## 2015-01-07 MED ORDER — CLINDAMYCIN PHOSPHATE 900 MG/50ML IV SOLN
900.0000 mg | INTRAVENOUS | Status: AC
  Administered 2015-01-08: 900 mg via INTRAVENOUS
  Filled 2015-01-07: qty 50

## 2015-01-08 ENCOUNTER — Inpatient Hospital Stay (HOSPITAL_COMMUNITY): Payer: Medicaid Other | Admitting: Vascular Surgery

## 2015-01-08 ENCOUNTER — Encounter (HOSPITAL_COMMUNITY): Payer: Self-pay | Admitting: *Deleted

## 2015-01-08 ENCOUNTER — Encounter (HOSPITAL_COMMUNITY)
Admission: RE | Disposition: A | Discharge status: Home / Self Care | Payer: Self-pay | Source: Ambulatory Visit | Attending: Orthopedic Surgery

## 2015-01-08 ENCOUNTER — Inpatient Hospital Stay (HOSPITAL_COMMUNITY)
Admission: RE | Admit: 2015-01-08 | Discharge: 2015-01-12 | DRG: 483 | Disposition: A | Discharge status: Home Health | Payer: Medicaid Other | Source: Ambulatory Visit | Attending: Orthopedic Surgery | Admitting: Orthopedic Surgery

## 2015-01-08 ENCOUNTER — Inpatient Hospital Stay (HOSPITAL_COMMUNITY): Payer: Medicaid Other

## 2015-01-08 ENCOUNTER — Inpatient Hospital Stay (HOSPITAL_COMMUNITY): Payer: Medicaid Other | Admitting: Anesthesiology

## 2015-01-08 DIAGNOSIS — Z880 Allergy status to penicillin: Secondary | ICD-10-CM

## 2015-01-08 DIAGNOSIS — G8929 Other chronic pain: Secondary | ICD-10-CM | POA: Diagnosis present

## 2015-01-08 DIAGNOSIS — Z96651 Presence of right artificial knee joint: Secondary | ICD-10-CM | POA: Diagnosis present

## 2015-01-08 DIAGNOSIS — Z96619 Presence of unspecified artificial shoulder joint: Secondary | ICD-10-CM

## 2015-01-08 DIAGNOSIS — F329 Major depressive disorder, single episode, unspecified: Secondary | ICD-10-CM | POA: Diagnosis present

## 2015-01-08 DIAGNOSIS — E6609 Other obesity due to excess calories: Secondary | ICD-10-CM | POA: Diagnosis present

## 2015-01-08 DIAGNOSIS — Z87891 Personal history of nicotine dependence: Secondary | ICD-10-CM

## 2015-01-08 DIAGNOSIS — I10 Essential (primary) hypertension: Secondary | ICD-10-CM | POA: Diagnosis present

## 2015-01-08 DIAGNOSIS — Z86718 Personal history of other venous thrombosis and embolism: Secondary | ICD-10-CM | POA: Diagnosis not present

## 2015-01-08 DIAGNOSIS — M19012 Primary osteoarthritis, left shoulder: Secondary | ICD-10-CM | POA: Diagnosis present

## 2015-01-08 DIAGNOSIS — Z79899 Other long term (current) drug therapy: Secondary | ICD-10-CM | POA: Diagnosis not present

## 2015-01-08 DIAGNOSIS — J449 Chronic obstructive pulmonary disease, unspecified: Secondary | ICD-10-CM | POA: Diagnosis present

## 2015-01-08 DIAGNOSIS — M25512 Pain in left shoulder: Secondary | ICD-10-CM | POA: Diagnosis present

## 2015-01-08 DIAGNOSIS — Z91018 Allergy to other foods: Secondary | ICD-10-CM

## 2015-01-08 DIAGNOSIS — Z96612 Presence of left artificial shoulder joint: Secondary | ICD-10-CM

## 2015-01-08 HISTORY — PX: TOTAL SHOULDER ARTHROPLASTY: SHX126

## 2015-01-08 SURGERY — ARTHROPLASTY, SHOULDER, TOTAL
Anesthesia: General | Site: Shoulder | Laterality: Left

## 2015-01-08 MED ORDER — ONDANSETRON HCL 4 MG PO TABS: 4.0000 mg | ORAL_TABLET | Freq: Four times a day (QID) | ORAL | Status: DC | PRN

## 2015-01-08 MED ORDER — MEPERIDINE HCL 25 MG/ML IJ SOLN: 6.2500 mg | INTRAMUSCULAR | Status: DC | PRN

## 2015-01-08 MED ORDER — LIDOCAINE HCL (CARDIAC) 20 MG/ML IV SOLN
INTRAVENOUS | Status: AC
  Filled 2015-01-08: qty 5

## 2015-01-08 MED ORDER — ACETAMINOPHEN 325 MG PO TABS
650.0000 mg | ORAL_TABLET | Freq: Four times a day (QID) | ORAL | Status: DC | PRN
  Administered 2015-01-09 – 2015-01-11 (×5): 650 mg via ORAL
  Filled 2015-01-08 (×5): qty 2

## 2015-01-08 MED ORDER — METOCLOPRAMIDE HCL 5 MG PO TABS
5.0000 mg | ORAL_TABLET | Freq: Three times a day (TID) | ORAL | Status: DC | PRN
  Filled 2015-01-08: qty 2

## 2015-01-08 MED ORDER — ROCURONIUM BROMIDE 50 MG/5ML IV SOLN
INTRAVENOUS | Status: AC
  Filled 2015-01-08: qty 1

## 2015-01-08 MED ORDER — PROPOFOL 10 MG/ML IV BOLUS
INTRAVENOUS | Status: AC
  Filled 2015-01-08: qty 20

## 2015-01-08 MED ORDER — VERAPAMIL HCL ER 240 MG PO TBCR
240.0000 mg | EXTENDED_RELEASE_TABLET | Freq: Every day | ORAL | Status: DC
Start: 2015-01-08 — End: 2015-01-12
  Administered 2015-01-08 – 2015-01-11 (×3): 240 mg via ORAL
  Filled 2015-01-08 (×7): qty 1

## 2015-01-08 MED ORDER — PHENYLEPHRINE HCL 10 MG/ML IJ SOLN
10.0000 mg | INTRAVENOUS | Status: DC | PRN
  Administered 2015-01-08: 25 ug/min via INTRAVENOUS

## 2015-01-08 MED ORDER — MIDAZOLAM HCL 5 MG/5ML IJ SOLN
INTRAMUSCULAR | Status: DC | PRN
  Administered 2015-01-08: 2 mg via INTRAVENOUS

## 2015-01-08 MED ORDER — BUPIVACAINE-EPINEPHRINE (PF) 0.25% -1:200000 IJ SOLN
INTRAMUSCULAR | Status: AC
  Filled 2015-01-08: qty 30

## 2015-01-08 MED ORDER — METHOCARBAMOL 1000 MG/10ML IJ SOLN
500.0000 mg | INTRAVENOUS | Status: DC
  Filled 2015-01-08: qty 5

## 2015-01-08 MED ORDER — ACETAMINOPHEN 10 MG/ML IV SOLN
INTRAVENOUS | Status: AC
  Administered 2015-01-08: 1000 mg via INTRAVENOUS
  Filled 2015-01-08: qty 100

## 2015-01-08 MED ORDER — ACETAMINOPHEN 650 MG RE SUPP: 650.0000 mg | Freq: Four times a day (QID) | RECTAL | Status: DC | PRN

## 2015-01-08 MED ORDER — ALBUTEROL SULFATE HFA 108 (90 BASE) MCG/ACT IN AERS
INHALATION_SPRAY | RESPIRATORY_TRACT | Status: AC
  Filled 2015-01-08: qty 6.7

## 2015-01-08 MED ORDER — FENTANYL CITRATE 0.05 MG/ML IJ SOLN
25.0000 ug | INTRAMUSCULAR | Status: DC | PRN
  Administered 2015-01-08 (×2): 50 ug via INTRAVENOUS

## 2015-01-08 MED ORDER — CLINDAMYCIN PHOSPHATE 600 MG/50ML IV SOLN
600.0000 mg | Freq: Four times a day (QID) | INTRAVENOUS | Status: AC
  Administered 2015-01-08 – 2015-01-09 (×3): 600 mg via INTRAVENOUS
  Filled 2015-01-08 (×4): qty 50

## 2015-01-08 MED ORDER — HYDROCODONE-ACETAMINOPHEN 5-325 MG PO TABS: 1.0000 | ORAL_TABLET | Freq: Every day | ORAL | Status: DC

## 2015-01-08 MED ORDER — LIDOCAINE HCL (CARDIAC) 20 MG/ML IV SOLN
INTRAVENOUS | Status: DC | PRN
  Administered 2015-01-08: 100 mg via INTRAVENOUS

## 2015-01-08 MED ORDER — NEOSTIGMINE METHYLSULFATE 10 MG/10ML IV SOLN
INTRAVENOUS | Status: DC | PRN
  Administered 2015-01-08: 5 mg via INTRAVENOUS

## 2015-01-08 MED ORDER — SODIUM CHLORIDE 0.9 % IR SOLN
Status: DC | PRN
  Administered 2015-01-08: 1000 mL

## 2015-01-08 MED ORDER — CYCLOBENZAPRINE HCL 10 MG PO TABS
5.0000 mg | ORAL_TABLET | Freq: Three times a day (TID) | ORAL | Status: DC | PRN
  Administered 2015-01-09 – 2015-01-12 (×2): 5 mg via ORAL
  Filled 2015-01-08 (×3): qty 1

## 2015-01-08 MED ORDER — METHOCARBAMOL 500 MG PO TABS: 500.0000 mg | ORAL_TABLET | Freq: Three times a day (TID) | ORAL | Status: DC | PRN

## 2015-01-08 MED ORDER — THROMBIN 5000 UNITS EX SOLR
OROMUCOSAL | Status: DC | PRN
  Administered 2015-01-08: 09:00:00 via TOPICAL

## 2015-01-08 MED ORDER — METHOCARBAMOL 500 MG PO TABS
500.0000 mg | ORAL_TABLET | Freq: Four times a day (QID) | ORAL | Status: DC | PRN
  Administered 2015-01-08 – 2015-01-12 (×13): 500 mg via ORAL
  Filled 2015-01-08 (×13): qty 1

## 2015-01-08 MED ORDER — LACTATED RINGERS IV SOLN
INTRAVENOUS | Status: DC | PRN
  Administered 2015-01-08 (×2): via INTRAVENOUS

## 2015-01-08 MED ORDER — HYDROMORPHONE HCL 1 MG/ML IJ SOLN
INTRAMUSCULAR | Status: AC
  Filled 2015-01-08: qty 1

## 2015-01-08 MED ORDER — DEXAMETHASONE SODIUM PHOSPHATE 4 MG/ML IJ SOLN
INTRAMUSCULAR | Status: AC
  Filled 2015-01-08: qty 2

## 2015-01-08 MED ORDER — CHLORHEXIDINE GLUCONATE 4 % EX LIQD
60.0000 mL | Freq: Once | CUTANEOUS | Status: DC
  Filled 2015-01-08: qty 60

## 2015-01-08 MED ORDER — MONTELUKAST SODIUM 10 MG PO TABS
10.0000 mg | ORAL_TABLET | Freq: Every day | ORAL | Status: DC
  Administered 2015-01-08 – 2015-01-11 (×4): 10 mg via ORAL
  Filled 2015-01-08 (×4): qty 1

## 2015-01-08 MED ORDER — MENTHOL 3 MG MT LOZG
1.0000 | LOZENGE | OROMUCOSAL | Status: DC | PRN
Start: 2015-01-08 — End: 2015-01-12

## 2015-01-08 MED ORDER — EPHEDRINE SULFATE 50 MG/ML IJ SOLN
INTRAMUSCULAR | Status: AC
  Filled 2015-01-08: qty 1

## 2015-01-08 MED ORDER — THROMBIN 5000 UNITS EX SOLR
CUTANEOUS | Status: AC
Start: 2015-01-08 — End: 2015-01-08
  Filled 2015-01-08: qty 5000

## 2015-01-08 MED ORDER — GLYCOPYRROLATE 0.2 MG/ML IJ SOLN
INTRAMUSCULAR | Status: DC | PRN
  Administered 2015-01-08: .8 mg via INTRAVENOUS

## 2015-01-08 MED ORDER — ALBUTEROL SULFATE (2.5 MG/3ML) 0.083% IN NEBU: 3.0000 mL | INHALATION_SOLUTION | Freq: Four times a day (QID) | RESPIRATORY_TRACT | Status: DC | PRN

## 2015-01-08 MED ORDER — METHOCARBAMOL 1000 MG/10ML IJ SOLN
500.0000 mg | Freq: Four times a day (QID) | INTRAVENOUS | Status: DC | PRN
  Filled 2015-01-08: qty 5

## 2015-01-08 MED ORDER — PROMETHAZINE HCL 25 MG/ML IJ SOLN: 6.2500 mg | INTRAMUSCULAR | Status: DC | PRN

## 2015-01-08 MED ORDER — PROPOFOL 10 MG/ML IV BOLUS
INTRAVENOUS | Status: DC | PRN
  Administered 2015-01-08: 160 mg via INTRAVENOUS
  Administered 2015-01-08: 40 mg via INTRAVENOUS

## 2015-01-08 MED ORDER — PREDNISONE 20 MG PO TABS: 30.0000 mg | ORAL_TABLET | Freq: Every day | ORAL | Status: DC

## 2015-01-08 MED ORDER — HYDROMORPHONE HCL 1 MG/ML IJ SOLN
0.5000 mg | INTRAMUSCULAR | Status: DC | PRN
  Administered 2015-01-08 (×2): 1 mg via INTRAVENOUS
  Administered 2015-01-08: 0.5 mg via INTRAVENOUS
  Administered 2015-01-08 – 2015-01-09 (×6): 1 mg via INTRAVENOUS
  Filled 2015-01-08 (×8): qty 1

## 2015-01-08 MED ORDER — ONDANSETRON HCL 4 MG/2ML IJ SOLN
4.0000 mg | Freq: Four times a day (QID) | INTRAMUSCULAR | Status: DC | PRN
  Administered 2015-01-08: 4 mg via INTRAVENOUS
  Filled 2015-01-08: qty 2

## 2015-01-08 MED ORDER — ROCURONIUM BROMIDE 100 MG/10ML IV SOLN
INTRAVENOUS | Status: DC | PRN
  Administered 2015-01-08: 50 mg via INTRAVENOUS

## 2015-01-08 MED ORDER — TRAZODONE HCL 100 MG PO TABS
100.0000 mg | ORAL_TABLET | Freq: Every day | ORAL | Status: DC
  Administered 2015-01-08 – 2015-01-11 (×4): 100 mg via ORAL
  Filled 2015-01-08 (×4): qty 1

## 2015-01-08 MED ORDER — FLUTICASONE PROPIONATE 50 MCG/ACT NA SUSP: 1.0000 | Freq: Every day | NASAL | Status: DC | PRN

## 2015-01-08 MED ORDER — ONDANSETRON HCL 4 MG/2ML IJ SOLN
INTRAMUSCULAR | Status: AC
  Filled 2015-01-08: qty 2

## 2015-01-08 MED ORDER — VITAMIN D3 25 MCG (1000 UNIT) PO TABS
1000.0000 [IU] | ORAL_TABLET | Freq: Every day | ORAL | Status: DC
  Administered 2015-01-08 – 2015-01-12 (×5): 1000 [IU] via ORAL
  Filled 2015-01-08 (×8): qty 1

## 2015-01-08 MED ORDER — FENTANYL CITRATE 0.05 MG/ML IJ SOLN
INTRAMUSCULAR | Status: AC
  Filled 2015-01-08: qty 5

## 2015-01-08 MED ORDER — WHITE PETROLATUM GEL
Status: AC
  Filled 2015-01-08: qty 1

## 2015-01-08 MED ORDER — FENTANYL CITRATE 0.05 MG/ML IJ SOLN
INTRAMUSCULAR | Status: DC | PRN
  Administered 2015-01-08 (×4): 50 ug via INTRAVENOUS

## 2015-01-08 MED ORDER — METOCLOPRAMIDE HCL 5 MG/ML IJ SOLN
5.0000 mg | Freq: Three times a day (TID) | INTRAMUSCULAR | Status: DC | PRN
  Administered 2015-01-08: 10 mg via INTRAVENOUS
  Filled 2015-01-08: qty 2

## 2015-01-08 MED ORDER — LORATADINE 10 MG PO TABS
10.0000 mg | ORAL_TABLET | Freq: Every day | ORAL | Status: DC
  Administered 2015-01-09 – 2015-01-12 (×4): 10 mg via ORAL
  Filled 2015-01-08 (×4): qty 1

## 2015-01-08 MED ORDER — MIDAZOLAM HCL 2 MG/2ML IJ SOLN
INTRAMUSCULAR | Status: AC
  Filled 2015-01-08: qty 2

## 2015-01-08 MED ORDER — METHOCARBAMOL 500 MG PO TABS
ORAL_TABLET | ORAL | Status: AC
  Filled 2015-01-08: qty 1

## 2015-01-08 MED ORDER — ONDANSETRON HCL 4 MG/2ML IJ SOLN
INTRAMUSCULAR | Status: DC | PRN
  Administered 2015-01-08: 4 mg via INTRAVENOUS

## 2015-01-08 MED ORDER — OXYCODONE-ACETAMINOPHEN 5-325 MG PO TABS
ORAL_TABLET | ORAL | Status: AC
  Filled 2015-01-08: qty 2

## 2015-01-08 MED ORDER — NEOSTIGMINE METHYLSULFATE 10 MG/10ML IV SOLN
INTRAVENOUS | Status: AC
  Filled 2015-01-08: qty 1

## 2015-01-08 MED ORDER — FENTANYL CITRATE 0.05 MG/ML IJ SOLN
INTRAMUSCULAR | Status: AC
  Filled 2015-01-08: qty 2

## 2015-01-08 MED ORDER — OXYCODONE-ACETAMINOPHEN 5-325 MG PO TABS: 1.0000 | ORAL_TABLET | ORAL | Status: DC | PRN

## 2015-01-08 MED ORDER — VITAMIN C 500 MG PO TABS
1000.0000 mg | ORAL_TABLET | Freq: Every day | ORAL | Status: DC
  Administered 2015-01-09 – 2015-01-12 (×4): 1000 mg via ORAL
  Filled 2015-01-08 (×4): qty 2

## 2015-01-08 MED ORDER — PHENYLEPHRINE HCL 10 MG/ML IJ SOLN
INTRAMUSCULAR | Status: DC | PRN
  Administered 2015-01-08 (×5): 80 ug via INTRAVENOUS

## 2015-01-08 MED ORDER — SODIUM CHLORIDE 0.9 % IV SOLN: INTRAVENOUS | Status: DC

## 2015-01-08 MED ORDER — PHENYLEPHRINE 40 MCG/ML (10ML) SYRINGE FOR IV PUSH (FOR BLOOD PRESSURE SUPPORT)
PREFILLED_SYRINGE | INTRAVENOUS | Status: AC
  Filled 2015-01-08: qty 10

## 2015-01-08 MED ORDER — GLYCOPYRROLATE 0.2 MG/ML IJ SOLN
INTRAMUSCULAR | Status: AC
  Filled 2015-01-08: qty 3

## 2015-01-08 MED ORDER — SODIUM CHLORIDE 0.9 % IJ SOLN
INTRAMUSCULAR | Status: AC
  Filled 2015-01-08: qty 10

## 2015-01-08 MED ORDER — DEXAMETHASONE SODIUM PHOSPHATE 4 MG/ML IJ SOLN
INTRAMUSCULAR | Status: DC | PRN
  Administered 2015-01-08: 8 mg via INTRAVENOUS

## 2015-01-08 MED ORDER — PHENOL 1.4 % MT LIQD: 1.0000 | OROMUCOSAL | Status: DC | PRN

## 2015-01-08 MED ORDER — OXYCODONE-ACETAMINOPHEN 5-325 MG PO TABS
1.0000 | ORAL_TABLET | ORAL | Status: DC | PRN
  Administered 2015-01-08 – 2015-01-09 (×5): 2 via ORAL
  Filled 2015-01-08 (×4): qty 2

## 2015-01-08 MED ORDER — BUPIVACAINE-EPINEPHRINE (PF) 0.5% -1:200000 IJ SOLN
INTRAMUSCULAR | Status: DC | PRN
  Administered 2015-01-08: 20 mL via PERINEURAL

## 2015-01-08 SURGICAL SUPPLY — 75 items
BLADE SAW SAG 73X25 THK (BLADE) ×2
BLADE SAW SGTL 73X25 THK (BLADE) ×1 IMPLANT
BUR SURG 4X8 MED (BURR) IMPLANT
BURR SURG 4MMX8MM MEDIUM (BURR)
BURR SURG 4X8 MED (BURR)
CAPT SHLDR TOTAL 2 ×2 IMPLANT
CEMENT BONE DEPUY (Cement) ×3 IMPLANT
CLOSURE STERI-STRIP 1/2X4 (GAUZE/BANDAGES/DRESSINGS) ×1
CLOSURE WOUND 1/2 X4 (GAUZE/BANDAGES/DRESSINGS) ×1
CLSR STERI-STRIP ANTIMIC 1/2X4 (GAUZE/BANDAGES/DRESSINGS) ×1 IMPLANT
COVER SURGICAL LIGHT HANDLE (MISCELLANEOUS) ×3 IMPLANT
DRAPE IMP U-DRAPE 54X76 (DRAPES) ×3 IMPLANT
DRAPE INCISE IOBAN 66X45 STRL (DRAPES) ×9 IMPLANT
DRAPE U-SHAPE 47X51 STRL (DRAPES) ×3 IMPLANT
DRAPE X-RAY CASS 24X20 (DRAPES) IMPLANT
DRILL BIT 5/64 (BIT) ×3 IMPLANT
DRSG ADAPTIC 3X8 NADH LF (GAUZE/BANDAGES/DRESSINGS) ×3 IMPLANT
DRSG PAD ABDOMINAL 8X10 ST (GAUZE/BANDAGES/DRESSINGS) ×4 IMPLANT
DURAPREP 26ML APPLICATOR (WOUND CARE) ×3 IMPLANT
ELECT BLADE 4.0 EZ CLEAN MEGAD (MISCELLANEOUS) ×3
ELECT NDL TIP 2.8 STRL (NEEDLE) ×1 IMPLANT
ELECT NEEDLE TIP 2.8 STRL (NEEDLE) ×3 IMPLANT
ELECT REM PT RETURN 9FT ADLT (ELECTROSURGICAL) ×3
ELECTRODE BLDE 4.0 EZ CLN MEGD (MISCELLANEOUS) ×1 IMPLANT
ELECTRODE REM PT RTRN 9FT ADLT (ELECTROSURGICAL) ×1 IMPLANT
GAUZE SPONGE 4X4 12PLY STRL (GAUZE/BANDAGES/DRESSINGS) ×3 IMPLANT
GLOVE BIOGEL PI ORTHO PRO 7.5 (GLOVE) ×4
GLOVE BIOGEL PI ORTHO PRO SZ8 (GLOVE) ×2
GLOVE ORTHO TXT STRL SZ7.5 (GLOVE) ×3 IMPLANT
GLOVE PI ORTHO PRO STRL 7.5 (GLOVE) ×1 IMPLANT
GLOVE PI ORTHO PRO STRL SZ8 (GLOVE) ×1 IMPLANT
GLOVE SURG ORTHO 8.5 STRL (GLOVE) ×6 IMPLANT
GOWN STRL REUS W/ TWL XL LVL3 (GOWN DISPOSABLE) ×3 IMPLANT
GOWN STRL REUS W/TWL XL LVL3 (GOWN DISPOSABLE) ×9
HANDPIECE INTERPULSE COAX TIP (DISPOSABLE)
KIT BASIN OR (CUSTOM PROCEDURE TRAY) ×3 IMPLANT
KIT ROOM TURNOVER OR (KITS) ×3 IMPLANT
MANIFOLD NEPTUNE II (INSTRUMENTS) ×3 IMPLANT
NDL 1/2 CIR MAYO (NEEDLE) ×1 IMPLANT
NDL HYPO 25GX1X1/2 BEV (NEEDLE) ×1 IMPLANT
NDL SUT 6 .5 CRC .975X.05 MAYO (NEEDLE) ×1 IMPLANT
NEEDLE 1/2 CIR MAYO (NEEDLE) ×3 IMPLANT
NEEDLE HYPO 25GX1X1/2 BEV (NEEDLE) ×3 IMPLANT
NEEDLE MAYO TAPER (NEEDLE) ×3
NS IRRIG 1000ML POUR BTL (IV SOLUTION) ×3 IMPLANT
PACK SHOULDER (CUSTOM PROCEDURE TRAY) ×3 IMPLANT
PACK UNIVERSAL I (CUSTOM PROCEDURE TRAY) ×3 IMPLANT
PAD ARMBOARD 7.5X6 YLW CONV (MISCELLANEOUS) ×6 IMPLANT
PIN METAGLENE 2.5 (PIN) IMPLANT
SET HNDPC FAN SPRY TIP SCT (DISPOSABLE) IMPLANT
SLING ARM IMMOBILIZER LRG (SOFTGOODS) ×3 IMPLANT
SLING ARM IMMOBILIZER MED (SOFTGOODS) IMPLANT
SMARTMIX MINI TOWER (MISCELLANEOUS) ×3
SPONGE LAP 18X18 X RAY DECT (DISPOSABLE) ×3 IMPLANT
SPONGE LAP 4X18 X RAY DECT (DISPOSABLE) ×3 IMPLANT
SPONGE SURGIFOAM ABS GEL SZ50 (HEMOSTASIS) IMPLANT
STRIP CLOSURE SKIN 1/2X4 (GAUZE/BANDAGES/DRESSINGS) ×2 IMPLANT
SUCTION FRAZIER TIP 10 FR DISP (SUCTIONS) ×3 IMPLANT
SUT BONE WAX W31G (SUTURE) ×2 IMPLANT
SUT FIBERWIRE #2 38 T-5 BLUE (SUTURE) ×6
SUT MNCRL AB 4-0 PS2 18 (SUTURE) ×3 IMPLANT
SUT VIC AB 0 CT1 27 (SUTURE) ×3
SUT VIC AB 0 CT1 27XBRD ANBCTR (SUTURE) ×1 IMPLANT
SUT VIC AB 2-0 CT1 27 (SUTURE) ×3
SUT VIC AB 2-0 CT1 TAPERPNT 27 (SUTURE) ×1 IMPLANT
SUT VICRYL AB 2 0 TIES (SUTURE) ×3 IMPLANT
SUTURE FIBERWR #2 38 T-5 BLUE (SUTURE) ×2 IMPLANT
SYR CONTROL 10ML LL (SYRINGE) ×3 IMPLANT
TAPE CLOTH SURG 6X10 WHT LF (GAUZE/BANDAGES/DRESSINGS) ×2 IMPLANT
TOWEL OR 17X24 6PK STRL BLUE (TOWEL DISPOSABLE) ×3 IMPLANT
TOWEL OR 17X26 10 PK STRL BLUE (TOWEL DISPOSABLE) ×3 IMPLANT
TOWER SMARTMIX MINI (MISCELLANEOUS) ×1 IMPLANT
TRAY FOLEY CATH 16FRSI W/METER (SET/KITS/TRAYS/PACK) ×3 IMPLANT
WATER STERILE IRR 1000ML POUR (IV SOLUTION) ×3 IMPLANT
YANKAUER SUCT BULB TIP NO VENT (SUCTIONS) ×2 IMPLANT

## 2015-01-08 NOTE — Brief Op Note (Signed)
01/08/2015  10:22 AM  PATIENT:  Amanda Christensen  54 y.o. female  PRE-OPERATIVE DIAGNOSIS:  left shoulder osteoarthritis, end stage  POST-OPERATIVE DIAGNOSIS:  left shoulder osteoarthritis, end stage  PROCEDURE:  Procedure(s): LEFT TOTAL SHOULDER ARTHROPLASTY (Left) [employer], APG glenoid   SURGEON:  Surgeon(s) and Role:    * Augustin Schooling, MD - Primary  PHYSICIAN ASSISTANT:   ASSISTANTS: Ventura Bruns, PA-C   ANESTHESIA:   regional and general  EBL:  Total I/O In: 1200 [I.V.:1200] Out: 100 [Blood:100]  BLOOD ADMINISTERED:none  DRAINS: none   LOCAL MEDICATIONS USED:  MARCAINE     SPECIMEN:  No Specimen  DISPOSITION OF SPECIMEN:  N/A  COUNTS:  YES  TOURNIQUET:  * No tourniquets in log *  DICTATION: .Other Dictation: Dictation Number [id number]  PLAN OF CARE: admit   PATIENT DISPOSITION:  PACU - hemodynamically stable.   Delay start of Pharmacological VTE agent (>24hrs) due to surgical blood loss or risk of bleeding: no

## 2015-01-08 NOTE — Anesthesia Preprocedure Evaluation (Addendum)
Anesthesia Evaluation  Patient identified by MRN, date of birth, ID band  Reviewed: Allergy & Precautions, NPO status , Patient's Chart, lab work & pertinent test results, reviewed documented beta blocker date and time   Airway Mallampati: II   Neck ROM: Full    Dental  (+) Teeth Intact, Dental Advisory Given   Pulmonary asthma , COPD COPD inhaler, former smoker (quit 2014),          Cardiovascular hypertension, Pt. on medications Rhythm:Regular  EF 74%, STRESS   Neuro/Psych Seizures -,  Depression    GI/Hepatic negative GI ROS, Neg liver ROS,   Endo/Other    Renal/GU GFR 69     Musculoskeletal   Abdominal (+)  Abdomen: soft.    Peds  Hematology   Anesthesia Other Findings   Reproductive/Obstetrics                            Anesthesia Physical Anesthesia Plan  ASA: III  Anesthesia Plan: General   Post-op Pain Management: MAC Combined w/ Regional for Post-op pain   Induction: Intravenous  Airway Management Planned: Oral ETT  Additional Equipment:   Intra-op Plan:   Post-operative Plan:   Informed Consent: I have reviewed the patients History and Physical, chart, labs and discussed the procedure including the risks, benefits and alternatives for the proposed anesthesia with the patient or authorized representative who has indicated his/her understanding and acceptance.     Plan Discussed with:   Anesthesia Plan Comments:         Anesthesia Quick Evaluation

## 2015-01-08 NOTE — Transfer of Care (Signed)
Immediate Anesthesia Transfer of Care Note  Patient: Amanda Christensen  Procedure(s) Performed: Procedure(s): LEFT TOTAL SHOULDER ARTHROPLASTY (Left)  Patient Location: PACU  Anesthesia Type:GA combined with regional for post-op pain  Level of Consciousness: awake, alert , oriented and patient cooperative  Airway & Oxygen Therapy: Patient Spontanous Breathing and Patient connected to nasal cannula oxygen  Post-op Assessment: Report given to RN and Post -op Vital signs reviewed and stable  Post vital signs: Reviewed and stable  Last Vitals:  Filed Vitals:   01/08/15 0545  BP: 150/87  Pulse: 65  Temp: 36.4 C  Resp: 20    Complications: No apparent anesthesia complications

## 2015-01-08 NOTE — Anesthesia Procedure Notes (Signed)
Anesthesia Regional Block:  Supraclavicular block  Pre-Anesthetic Checklist: ,, timeout performed, Correct Patient, Correct Site, Correct Laterality, Correct Procedure, Correct Position, site marked, Risks and benefits discussed,  Surgical consent,  Pre-op evaluation,  At surgeon's request and post-op pain management  Laterality: Left and Upper  Prep: Maximum Sterile Barrier Precautions used, chloraprep and Dura Prep       Needles:  Injection technique: Single-shot     Needle Length: 10cm 10 cm Needle Gauge: 21 and 21 G    Additional Needles:  Procedures: ultrasound guided (picture in chart) and nerve stimulator Supraclavicular block  Nerve Stimulator or Paresthesia:  Response: 0.5 mA,   Additional Responses:   Narrative:  Start time: 01/08/2015 6:50 AM End time: 01/08/2015 7:00 AM Anesthesiologist: Alexis Frock  Additional Notes: L IS block [place], stim, multiple asp, talked with patient throughout, 20 ml .5% marcaine with epi

## 2015-01-08 NOTE — Interval H&P Note (Signed)
History and Physical Interval Note:  01/08/2015 7:24 AM  Amanda Christensen  has presented today for surgery, with the diagnosis of left shoulder osteoarthritis  The various methods of treatment have been discussed with the patient and family. After consideration of risks, benefits and other options for treatment, the patient has consented to  Procedure(s): LEFT TOTAL SHOULDER ARTHROPLASTY (Left) as a surgical intervention .  The patient's history has been reviewed, patient examined, no change in status, stable for surgery.  I have reviewed the patient's chart and labs.  Questions were answered to the patient's satisfaction.     Jacarius Handel,STEVEN R

## 2015-01-08 NOTE — Anesthesia Postprocedure Evaluation (Signed)
  Anesthesia Post-op Note  Patient: Amanda Christensen  Procedure(s) Performed: Procedure(s): LEFT TOTAL SHOULDER ARTHROPLASTY (Left)  Patient Location: PACU  Anesthesia Type:General  Level of Consciousness: awake and alert   Airway and Oxygen Therapy: Patient Spontanous Breathing and Patient connected to nasal cannula oxygen  Post-op Pain: mild  Post-op Assessment: Post-op Vital signs reviewed, Patient's Cardiovascular Status Stable, Respiratory Function Stable, Patent Airway and No signs of Nausea or vomiting  Post-op Vital Signs: Reviewed and stable  Last Vitals:  Filed Vitals:   01/08/15 0545  BP: 150/87  Pulse: 65  Temp: 36.4 C  Resp: 20    Complications: No apparent anesthesia complications

## 2015-01-08 NOTE — Discharge Instructions (Signed)
Ice the shoulder constantly.  Keep the incision clean and dry and covered for one week, then ok to get wet in the shower.  Do exercises every hour while awake.  Do heavy lifting pushing or pulling with the left arm.  Activity of daily living is ok.  Follow up in the office in two weeks  [phone]

## 2015-01-09 ENCOUNTER — Encounter (HOSPITAL_COMMUNITY): Payer: Self-pay | Admitting: Orthopedic Surgery

## 2015-01-09 LAB — BASIC METABOLIC PANEL
Anion gap: 5 (ref 5–15)
BUN: 9 mg/dL (ref 6–23)
CALCIUM: 9 mg/dL (ref 8.4–10.5)
CHLORIDE: 103 mmol/L (ref 96–112)
CO2: 30 mmol/L (ref 19–32)
Creatinine, Ser: 0.91 mg/dL (ref 0.50–1.10)
GFR calc Af Amer: 81 mL/min — ABNORMAL LOW (ref >90)
GFR, EST NON AFRICAN AMERICAN: 70 mL/min — AB (ref >90)
GLUCOSE: 125 mg/dL — AB (ref 70–99)
Potassium: 4.5 mmol/L (ref 3.5–5.1)
SODIUM: 138 mmol/L (ref 135–145)

## 2015-01-09 LAB — HEMOGLOBIN AND HEMATOCRIT, BLOOD
HCT: 37.2 % (ref 36.0–46.0)
Hemoglobin: 11.7 g/dL — ABNORMAL LOW (ref 12.0–15.0)

## 2015-01-09 MED ORDER — OXYCODONE HCL 5 MG PO TABS
5.0000 mg | ORAL_TABLET | ORAL | Status: DC | PRN
  Administered 2015-01-09: 10 mg via ORAL
  Administered 2015-01-10 (×3): 15 mg via ORAL
  Administered 2015-01-10: 10 mg via ORAL
  Administered 2015-01-10 – 2015-01-11 (×2): 15 mg via ORAL
  Administered 2015-01-11 (×3): 10 mg via ORAL
  Administered 2015-01-12 (×2): 15 mg via ORAL
  Administered 2015-01-12 (×2): 10 mg via ORAL
  Administered 2015-01-12: 15 mg via ORAL
  Filled 2015-01-09 (×2): qty 3
  Filled 2015-01-09 (×2): qty 2
  Filled 2015-01-09: qty 3
  Filled 2015-01-09: qty 2
  Filled 2015-01-09 (×2): qty 3
  Filled 2015-01-09: qty 2
  Filled 2015-01-09: qty 3
  Filled 2015-01-09 (×2): qty 2
  Filled 2015-01-09: qty 3
  Filled 2015-01-09: qty 2
  Filled 2015-01-09: qty 3

## 2015-01-09 MED ORDER — HYDROCODONE-ACETAMINOPHEN 5-325 MG PO TABS
1.0000 | ORAL_TABLET | ORAL | Status: DC | PRN
  Administered 2015-01-09: 2 via ORAL
  Filled 2015-01-09: qty 2

## 2015-01-09 MED ORDER — DIPHENHYDRAMINE HCL 25 MG PO CAPS
25.0000 mg | ORAL_CAPSULE | Freq: Four times a day (QID) | ORAL | Status: DC | PRN
  Administered 2015-01-09: 50 mg via ORAL
  Administered 2015-01-09 – 2015-01-10 (×3): 25 mg via ORAL
  Administered 2015-01-11: 50 mg via ORAL
  Filled 2015-01-09 (×2): qty 1
  Filled 2015-01-09: qty 2
  Filled 2015-01-09: qty 1

## 2015-01-09 MED ORDER — DIPHENHYDRAMINE HCL 12.5 MG/5ML PO ELIX
12.5000 mg | ORAL_SOLUTION | Freq: Once | ORAL | Status: AC
  Administered 2015-01-09: 12.5 mg via ORAL
  Filled 2015-01-09: qty 10

## 2015-01-09 MED ORDER — OXYCODONE HCL 5 MG PO TABS: 5.0000 mg | ORAL_TABLET | ORAL | Status: DC

## 2015-01-09 MED ORDER — HYDROXYZINE HCL 25 MG PO TABS
25.0000 mg | ORAL_TABLET | Freq: Four times a day (QID) | ORAL | Status: DC | PRN
  Administered 2015-01-09: 25 mg via ORAL
  Filled 2015-01-09: qty 1

## 2015-01-09 MED ORDER — DIPHENHYDRAMINE HCL 25 MG PO CAPS
25.0000 mg | ORAL_CAPSULE | Freq: Four times a day (QID) | ORAL | Status: DC | PRN
  Filled 2015-01-09 (×2): qty 1

## 2015-01-09 NOTE — Progress Notes (Signed)
Amanda Christensen reports that percocet makes her stomach upset.  She has taken Vicodin in the past without problems.

## 2015-01-09 NOTE — Progress Notes (Signed)
Patient ID: Amanda Christensen, female   DOB: 1961/01/16, 54 y.o.   MRN: [id number] Subjective: 1 Day Post-Op Procedure(s) (LRB): LEFT TOTAL SHOULDER ARTHROPLASTY (Left)    Patient reports pain as moderate to severe.  Not much sleep last night due to pain.  Lives with daughter who works  Objective:   VITALS:   Filed Vitals:   01/09/15 0415  BP: 99/58  Pulse: 64  Temp: 98.4 F (36.9 C)  Resp: 16    Neurovascular intact Incision: dressing C/D/I, LUE Moving finders wrist fine  LABS No results for input(s): HGB, HCT, WBC, PLT in the last 72 hours.  No results for input(s): NA, K, BUN, CREATININE, GLUCOSE in the last 72 hours.  No results for input(s): LABPT, INR in the last 72 hours.   Assessment/Plan: 1 Day Post-Op Procedure(s) (LRB): LEFT TOTAL SHOULDER ARTHROPLASTY (Left)   Up with therapy Plan for discharge tomorrow, based on pain level will benefit from another day to get pain under control If something changes with regards to pain control and she wishes to leave today contact us

## 2015-01-09 NOTE — Op Note (Signed)
NAMESENDY, PLUTA                ACCOUNT NO.:  [id number]  MEDICAL RECORD NO.:  [id number]  LOCATION:  5N25C                        FACILITY:  [facility]  PHYSICIAN:  Doran Heater. Veverly Fells, M.D. DATE OF BIRTH:  June 02, 1961  DATE OF PROCEDURE:  01/08/2015 DATE OF DISCHARGE:                              OPERATIVE REPORT   PREOPERATIVE DIAGNOSIS:  Left shoulder end-stage arthritis.  POSTOPERATIVE DIAGNOSIS:  Left shoulder end-stage arthritis.  PROCEDURE PERFORMED:  Left total shoulder arthroplasty using Wright.  ATTENDING SURGEON:  Doran Heater. Veverly Fells, MD.  ASSISTANT:  Abbott Pao. Dixon, PA-C who scrubbed the entire procedure and necessary for satisfactory completion of surgery.  ANESTHESIA:  General anesthesia was used plus interscalene block.  ESTIMATED BLOOD LOSS:  100 mL.  FLUID REPLACEMENT:  1200 mL crystalloid.  INSTRUMENT COUNTS:  Correct.  COMPLICATIONS:  There were no complications.  ANTIBIOTICS:  Perioperative antibiotics were given.  INDICATIONS:  The patient is a 54 year old female with worsening left shoulder pain and dysfunction secondary to end-stage arthritis.  The patient has bone-on-bone on radiographs and MRI scan indicating thin but intact rotator cuff.  I counseled the patient regarding options for management.  The patient is having disabling pain and limited function with her shoulder and desires total shoulder arthroplasty to eliminate pain and restore function.  Informed consent was obtained.  DESCRIPTION OF PROCEDURE:  After an adequate level of anesthesia achieved, the patient was positioned in a modified beach chair position. Left shoulder correctly identified and sterilely prepped and draped in the usual manner.  Time-out called.  We entered the shoulder using standard deltopectoral incision starting at the coracoid process and extending down into the anterior humerus, dissection down to subcutaneous tissues using Bovie.  We identified  the cephalic vein and took it laterally with the deltoid and pectoralis was taken medially. Conjoined tendon identified and retracted medially.  We released subscapularis subperiosteally off the lesser tuberosity and placed #2 FiberWire suture in the end of the tendon for repair at the end.  We then went ahead and progressively externally rotated the humerus, releasing the inferior capsule off the humeral neck.  We then were able to place a T-handled Crego elevator over the top of the humeral head. There were large osteophytes noted and no cartilage. We protected the rotator cuff.  I externally rotated the elbow or the shoulder 30 degrees and made a cut with a neck resection guide protecting the axillary pouch with the large Crego elevator.  Once we had that head resection completed, we are pleased with that resection right at the level of the insertion of the rotator cuff.  We went ahead then and removed the remaining large osteophytes off the humeral side and this came out easily.  We then placed our Brown retractor and a large Crego and extended the shoulder delivering the proximal humerus to where we could repair.  We used sequential reamers up to size 12 and then broached for the size 12 GLOBAL UNITE Stem. We impacted that stem into place, matured and seated fully and then removed the stem and subluxed the humerus posteriorly.  We removed the biceps tendon and the remaining labral tissue.  We removed what little cartilage was left, which was primarily on the anterior rim of the glenoid and found our inferior scapular neck for appropriate version and alignment of the glenoid component.  We then placed our central guide pin and then reamed for the 40 glenoid, which was the appropriate size.  We protect the axillary nerve.  We did our peripheral hand reaming and then drilled our central peg hole for the APG glenoid component.  We then drilled our peripheral 3 holes using the jig and  then went ahead and trialed our size 48 PEG glenoid trial.  Once that seated fully we replaced with that and we removed that and irrigated the shoulder fully and then placed Gelfoam soaked in thrombin on the peripheral 3 holes.  We dried the glenoid very well and then cemented this peripheral 3 holes with a DePuy 1 cement.  We then impacted the APG glenoid in place with a good seating and firm fit.  We allowed the cement to harden and then went ahead and brought the humeral shaft back into the appropriate position to place the components.  We drilled 3 drill holes and placed #2 FiberWire suture into the lesser tuberosities to repair the subscapularis anatomically to the bone.  We then went ahead and used impaction grafting technique and impacted the real size 12 Global Unite Stem into place with the appropriate 25 degrees eversion.  We then at this point selected the head size to a 40 eccentric by 18 mm and reduced the shoulder and had nice soft tissue balancing with ability to displace the humerus 50% posteriorly and 50% inferiorly.  We then removed the trial component and impacted the real 40 eccentric by 18 mm head in place with the eccentricity dealt at [facility] to the rotator cuff.  This provided perfect bony coverage, and we then reduced the shoulder, and we were again happy with soft tissue balancing and repaired the subscapularis and rotator interval anatomically with #2 FiberWire suture to the bone.  We ranged the shoulder and had nice smooth mobility.  The rotator cuff was thin that was only concern that had for the functioning of the rotator cuff.  The muscle looked decent on the MRI and it was not perfect.  There was some atrophy in the supraspinatus but overall it felt like cuff integrity was enough to proceed with the standard shoulder placement in this 54 year old active individual.  We thoroughly irrigated.  We then closed deltopectoral interval with 0 Vicryl suture followed  by 2-0 Vicryl subcutaneous closure and 4-0 Monocryl for skin.  Steri-Strips applied, followed by sterile dressing, and shoulder sling.  The patient tolerated the surgery well.     Doran Heater. Veverly Fells, M.D.     SRN/MEDQ  D:  01/08/2015  T:  01/09/2015  Job:  [id number]

## 2015-01-09 NOTE — Evaluation (Signed)
Physical Therapy Evaluation Patient Details Name: Amanda Christensen MRN: [id number] DOB: [birth year] Today's Date: 01/09/2015   History of Present Illness  s/p L TSA; PMH: HTN, COPD, arthritis, asthma, seizures, chronic pain, h/o DVT after R  knee surgery  Clinical Impression  Pt admitted with above diagnosis. Pt currently with functional limitations due to the deficits listed below (see PT Problem List).  Pt will benefit from skilled PT to increase their independence and safety with mobility to allow discharge to the venue listed below.       Follow Up Recommendations Home health PT;Supervision/Assistance - 24 hour    Equipment Recommendations  Other (comment) (quad cane)    Recommendations for Other Services       Precautions / Restrictions Precautions Precautions: Shoulder;Fall Type of Shoulder Precautions: NWB, no AROM LUE Shoulder Interventions: Shoulder sling/immobilizer;Off for dressing/bathing/exercises Required Braces or Orthoses: Sling Restrictions LUE Weight Bearing: Non weight bearing      Mobility  Bed Mobility         Supine to sit: HOB elevated;Mod assist Sit to supine: Min assist;HOB elevated   General bed mobility comments: assist to power up trunk, verbal cues for sequencing  Transfers   Equipment used: 1 person hand held assist   Sit to Stand: Min assist         General transfer comment: Pt steady with initial stance.  Ambulation/Gait Ambulation/Gait assistance: Min assist Ambulation Distance (Feet): 15 Feet Assistive device: 1 person hand held assist Gait Pattern/deviations: Step-through pattern;Decreased stride length Gait velocity: decreased   General Gait Details: Gait distance limited by dizziness. Pt received IV pain meds at beginning of PT eval.  Stairs            Wheelchair Mobility    Modified Rankin (Stroke Patients Only)       Balance   Sitting-balance support: No upper extremity supported;Feet supported Sitting  balance-Leahy Scale: Good     Standing balance support: Single extremity supported;During functional activity Standing balance-Leahy Scale: Fair                               Pertinent Vitals/Pain Pain Assessment: 0-10 Pain Score: 7  Pain Location: L shoulder Pain Intervention(s): RN gave pain meds during session;Monitored during session;Limited activity within patient's tolerance    Home Living Family/patient expects to be discharged to:: Private residence Living Arrangements: Children;Spouse/significant other Available Help at Discharge: Family;Personal care attendant;Available 24 hours/day Type of Home: House Home Access: Stairs to enter Entrance Stairs-Rails: None Entrance Stairs-Number of Steps: 1 Home Layout: One level Home Equipment: Walker - 2 wheels;Amanda Christensen - single point Additional Comments: home aide 3 hours/day 7 days a week; adult son and daughter will provide 24 hour assist; spouse works    Prior Function Level of Independence: Needs assistance   Gait / Transfers Assistance Needed: pt notes she occasionally uses a cane or RW if she feels she needs one.    ADL's / Homemaking Assistance Needed: home aide assist with shower transfers due to R knee deficits, also assist with dressing and bathing due to deficits in R knee and L shoulder        Hand Dominance        Extremity/Trunk Assessment   Upper Extremity Assessment: Defer to OT evaluation           Lower Extremity Assessment: Overall WFL for tasks assessed      Cervical / Trunk Assessment: Normal  Communication  Communication: No difficulties  Cognition Arousal/Alertness: Awake/alert Behavior During Therapy: WFL for tasks assessed/performed Overall Cognitive Status: Within Functional Limits for tasks assessed                      General Comments      Exercises        Assessment/Plan    PT Assessment Patient needs continued PT services  PT Diagnosis Difficulty  walking;Acute pain   PT Problem List Decreased activity tolerance;Decreased balance;Decreased mobility;Pain;Decreased knowledge of precautions;Decreased knowledge of use of DME  PT Treatment Interventions DME instruction;Gait training;Stair training;Functional mobility training;Therapeutic activities;Therapeutic exercise;Patient/family education;Balance training   PT Goals (Current goals can be found in the Care Plan section) Acute Rehab PT Goals Patient Stated Goal: home PT Goal Formulation: With patient Time For Goal Achievement: 01/09/15 Potential to Achieve Goals: Good    Frequency Min 5X/week   Barriers to discharge        Co-evaluation               End of Session Equipment Utilized During Treatment: Gait belt;Other (comment) (sling LUE) Activity Tolerance: Treatment limited secondary to medical complications (Comment) (dizziness) Patient left: in bed;with call bell/phone within reach Nurse Communication: Mobility status         Time: 7408-1448 PT Time Calculation (min) (ACUTE ONLY): 16 min   Charges:   PT Evaluation $Initial PT Evaluation Tier I: 1 Procedure     PT G CodesLorriane Christensen 01/09/2015, 3:05 PM

## 2015-01-09 NOTE — Evaluation (Signed)
Occupational Therapy Evaluation Patient Details Name: Amanda Christensen MRN: [id number] DOB: Feb 06, 1961 Today's Date: 01/09/2015    History of Present Illness s/p L TSA; PMH: HTN, COPD, arthritis, asthma, seizures, chronic pain, h/o DVT after R  knee surgery   Clinical Impression   Pt admitted with the above diagnoses and presents with below problem list. Pt will benefit from continued acute OT to address the below listed deficits and maximize independence with basic ADLs prior to d/c home with family. PTA pt needed some assistance with shower transfers, bathing and dressing due to deficits in R knee and L shoulder. Pt is currently at mod A level for most ADLs and for stand pivot transfers. Ambulation not assessed this session due to increased dizziness in standing. Requesting PT consult.      Follow Up Recommendations  Supervision/Assistance - 24 hour;Home health OT    Equipment Recommendations  3 in 1 bedside comode;Tub/shower bench    Recommendations for Other Services PT consult     Precautions / Restrictions Precautions Precautions: Shoulder;Fall Type of Shoulder Precautions: NWB, no AROM LUE Shoulder Interventions: Shoulder sling/immobilizer;Off for dressing/bathing/exercises Precaution Booklet Issued: Yes (comment) Precaution Comments: reviewed handout with spouse present Required Braces or Orthoses: Sling Restrictions Weight Bearing Restrictions: Yes LUE Weight Bearing: Non weight bearing      Mobility Bed Mobility Overal bed mobility: Needs Assistance Bed Mobility: Supine to Sit;Sit to Supine     Supine to sit: HOB elevated;Mod assist Sit to supine: Min assist;HOB elevated   General bed mobility comments: Pt used bed rails and hand held assist with power up to EOB. Mod A mostly for powering up trunk.   Transfers Overall transfer level: Needs assistance Equipment used: 1 person hand held assist Transfers: Sit to/from Stand Sit to Stand: Mod assist;From  elevated surface         General transfer comment: mod A for balance; pt with h/o R knee deficits(surgery 2010) and limited AROM of R knee; ambulates with cane at baseline;  R knee block during power up to standing    Balance Overall balance assessment: Needs assistance Sitting-balance support: Single extremity supported;Feet supported Sitting balance-Leahy Scale: Fair Sitting balance - Comments: dizzy upon initial supine>EOB   Standing balance support: Single extremity supported Standing balance-Leahy Scale: Poor Standing balance comment: mod A to stand, R knee noted to buckle, knee block provided to assist with standing balance; pt c/o dizziness in standing bp 139/75 in standing                            ADL Overall ADL's : Needs assistance/impaired Eating/Feeding: Set up;Sitting   Grooming: Sitting;Minimal assistance   Upper Body Bathing: Moderate assistance;Sitting   Lower Body Bathing: Moderate assistance;Sit to/from stand   Upper Body Dressing : Minimal assistance;Sitting   Lower Body Dressing: Moderate assistance;Sit to/from stand   Toilet Transfer: Moderate assistance;Stand-pivot;BSC Toilet Transfer Details (indicate cue type and reason): 1 person HHA Toileting- Clothing Manipulation and Hygiene: Moderate assistance;Sit to/from stand   Tub/ Banker: Moderate assistance;Stand-pivot;3 in 1 Tub/Shower Transfer Details (indicate cue type and reason): 1 person HHA   General ADL Comments: Educated pt and spouse on techniques for safe ADL completion with shoulder precautions and how to direct caregivers. Shoulder protocal handout provided. Pt has family and home aide to assist with ADLs. Ambulation not attempted this session due to dizziness in standing. Requested PT consult.     Vision     Perception  Praxis      Pertinent Vitals/Pain Pain Assessment: 0-10 Pain Score: 8  Pain Location: L shoulder Pain Descriptors / Indicators:  Constant;Operative site guarding;Grimacing Pain Intervention(s): Limited activity within patient's tolerance;Monitored during session;Premedicated before session;Repositioned;Utilized relaxation techniques;Ice applied;Other (comment) (sling adjusted)     Hand Dominance     Extremity/Trunk Assessment Upper Extremity Assessment Upper Extremity Assessment: LUE deficits/detail LUE Deficits / Details: s/p TSA   Lower Extremity Assessment Lower Extremity Assessment: Defer to PT evaluation       Communication Communication Communication: No difficulties   Cognition Arousal/Alertness: Awake/alert Behavior During Therapy: WFL for tasks assessed/performed Overall Cognitive Status: Within Functional Limits for tasks assessed                     General Comments       Exercises Exercises: Shoulder     Shoulder Instructions Shoulder Instructions Donning/doffing shirt without moving shoulder:  (educated) Method for sponge bathing under operated UE: Patient able to independently direct caregiver (educated) Donning/doffing sling/immobilizer:  (educated) Correct positioning of sling/immobilizer:  (educated) Pendulum exercises (written home exercise program):  (educated - have someone beside her and next to a counter) ROM for elbow, wrist and digits of operated UE: Modified independent Sling wearing schedule (on at all times/off for ADL's):  (educated) Proper positioning of operated UE when showering:  (educated) Positioning of UE while sleeping:  (educated)    Home Living Family/patient expects to be discharged to:: Private residence Living Arrangements: Children Available Help at Discharge: Family;Personal care attendant;Available 24 hours/day Type of Home: House Home Access: Stairs to enter [employer] of Steps: 1 Entrance Stairs-Rails: None Home Layout: One level     Bathroom Shower/Tub: Teacher, early years/pre: Standard     Home Equipment:  Environmental consultant - 2 wheels;Kasandra Knudsen - single point   Additional Comments: home aide 3 hours/day 7 days a week; adult son and daughter will provide 24 hour assist; spouse works      Prior Functioning/Environment Level of Independence: Needs assistance  Gait / Transfers Assistance Needed: pt notes she occasionally uses a cane or RW if she feels she needs one.   ADL's / Homemaking Assistance Needed: home aide assist with shower transfers due to R knee deficits, also assist with dressing and bathing due to deficits in R knee and L shoulder        OT Diagnosis: Acute pain   OT Problem List: Decreased range of motion;Decreased activity tolerance;Impaired balance (sitting and/or standing);Decreased knowledge of use of DME or AE;Decreased knowledge of precautions;Impaired UE functional use;Pain   OT Treatment/Interventions: Self-care/ADL training;Therapeutic exercise;Energy conservation;DME and/or AE instruction;Therapeutic activities;Patient/family education;Balance training;Other (comment) (Shoulder protocol)    OT Goals(Current goals can be found in the care plan section) Acute Rehab OT Goals Patient Stated Goal: not stated OT Goal Formulation: With patient Time For Goal Achievement: 01/16/15 Potential to Achieve Goals: Good ADL Goals Pt Will Perform Upper Body Dressing: with min assist;with adaptive equipment;sitting Pt Will Perform Lower Body Dressing: with min assist;with adaptive equipment;sit to/from stand Pt Will Transfer to Toilet: with supervision;ambulating (3n1 over toilet) Pt Will Perform Toileting - Clothing Manipulation and hygiene: with supervision;sit to/from stand Pt/caregiver will Perform Home Exercise Program: With written HEP provided;Left upper extremity;With minimal assist  OT Frequency: Min 3X/week   Barriers to D/C:            Co-evaluation              End of Session Equipment Utilized During  Treatment: Gait belt;Other (comment);Oxygen (sling) Nurse Communication:  Other (comment) (PT consult; dizziness; pain level)  Activity Tolerance: Patient limited by pain;Patient tolerated treatment well Patient left: in bed;with call bell/phone within reach;with family/visitor present;Other (comment) (SCDs )   Time: 6967-8938 OT Time Calculation (min): 41 min Charges:  OT General Charges $OT Visit: 1 Procedure OT Evaluation $Initial OT Evaluation Tier I: 1 Procedure OT Treatments $Self Care/Home Management : 8-22 mins $Therapeutic Exercise: 8-22 mins G-Codes:    Hortencia Pilar 01-23-15, 11:11 AM

## 2015-01-09 NOTE — Progress Notes (Signed)
OT Cancellation Note  Patient Details Name: Amanda Christensen MRN: [id number] DOB: 1961/11/02   Cancelled Treatment:    Reason Eval/Treat Not Completed: Pain limiting ability to participate. Pt reports she just received pain medication. Will reattempt.  Hortencia Pilar 01/09/2015, 8:34 AM

## 2015-01-10 NOTE — Progress Notes (Signed)
Physical Therapy Treatment Patient Details Name: Amanda Christensen MRN: [id number] DOB: [birth year] Today's Date: 01/10/2015    History of Present Illness s/p L TSA; PMH: HTN, COPD, arthritis, asthma, seizures, chronic pain, h/o DVT after R  knee surgery    PT Comments    Pt agreeable to participate in PT session.  Continues to report feeling dizzy when she moves & gets up to walk however BP WNL with sitting, standing, & after walking.  Pt states she will be home majority of day by herself & will not have sufficient assistance therefore this clinician feels pt would benefit from ST-SNF to maximize independence prior to d/c home.  D/c plans updated to SNF.       Follow Up Recommendations  SNF;Supervision/Assistance - 24 hour;Home health PT     Equipment Recommendations   (quad cane)    Recommendations for Other Services       Precautions / Restrictions Precautions Precautions: Shoulder;Fall Type of Shoulder Precautions: NWB; elbow, wrist, hand AROM; shoulder FF and ER AAROM within the limits of pain; pendulums Veverly Fells Protocol) Shoulder Interventions: Shoulder sling/immobilizer;Off for dressing/bathing/exercises Precaution Comments: reviewed handout with spouse present Required Braces or Orthoses: Sling Restrictions Weight Bearing Restrictions: No LUE Weight Bearing: Non weight bearing    Mobility  Bed Mobility               General bed mobility comments: Pt sitting in recliner upon arrival  Transfers Overall transfer level: Needs assistance Equipment used: None Transfers: Sit to/from Stand Sit to Stand: Min assist         General transfer comment: (A) to stand.  Pt continues to be dizzy with transitional movements & ambulating.    Ambulation/Gait Ambulation/Gait assistance: Min assist Ambulation Distance (Feet): 65 Feet Assistive device: 1 person hand held assist Gait Pattern/deviations: Step-through pattern;Decreased step length - right;Decreased step length  - left;Decreased stride length Gait velocity: decreased   General Gait Details: Pt continues to report dizziness with standing & ambulation however BP WNL and pt does not report dizziness worsening the longer she stands therefore proceeded to walk but with cautiou & with chair directly behind pt.     Stairs            Wheelchair Mobility    Modified Rankin (Stroke Patients Only)       Balance                                    Cognition Arousal/Alertness: Awake/alert Behavior During Therapy: WFL for tasks assessed/performed Overall Cognitive Status: Within Functional Limits for tasks assessed                      Exercises    General Comments        Pertinent Vitals/Pain Pain Assessment: 0-10 Pain Score: 6  Pain Location: Lt shoulder Pain Descriptors / Indicators: Aching;Constant;Grimacing Pain Intervention(s): Monitored during session;Premedicated before session;Repositioned;Ice applied    Home Living                      Prior Function            PT Goals (current goals can now be found in the care plan section) Acute Rehab PT Goals Patient Stated Goal: home PT Goal Formulation: With patient Time For Goal Achievement: 01/09/15 Potential to Achieve Goals: Good Progress towards PT goals: Progressing toward goals  Frequency  Min 5X/week    PT Plan Discharge plan needs to be updated    Co-evaluation             End of Session Equipment Utilized During Treatment: Gait belt;Other (comment) (sling LUE) Activity Tolerance: Patient tolerated treatment well;Treatment limited secondary to medical complications (Comment) ((dizziness)) Patient left: in chair;with call bell/phone within reach;with family/visitor present     Time: 1130-1153 PT Time Calculation (min) (ACUTE ONLY): 23 min  Charges:  $Gait Training: 23-37 mins                    G Codes:      Sena Hitch 01/10/2015, 12:11 PM   Sarajane Marek,  PTA [phone] 01/10/2015

## 2015-01-10 NOTE — Progress Notes (Signed)
Subjective: 2 Days Post-Op Procedure(s) (LRB): LEFT TOTAL SHOULDER ARTHROPLASTY (Left) Patient reports pain as 6 on 0-10 scale.   Patient states dizzy when standing. Objective: Vital signs in last 24 hours: Temp:  [98.2 F (36.8 C)-99.2 F (37.3 C)] 99.2 F (37.3 C) (02/21 0454) Pulse Rate:  [63-66] 65 (02/21 0454) Resp:  [16] 16 (02/21 0454) BP: (109-118)/(58-67) 109/60 mmHg (02/21 0454) SpO2:  [96 %-100 %] 96 % (02/21 0454)  Intake/Output from previous day: 02/20 0701 - 02/21 0700 In: 860 [P.O.:860] Out: -  Intake/Output this shift:     Recent Labs  01/09/15 0624  HGB 11.7*    Recent Labs  01/09/15 0624  HCT 37.2    Recent Labs  01/09/15 0624  NA 138  K 4.5  CL 103  CO2 30  BUN 9  CREATININE 0.91  GLUCOSE 125*  CALCIUM 9.0   No results for input(s): LABPT, INR in the last 72 hours.  Incision: dressing C/D/I Pulses intact and moves hand well.  Assessment/Plan: 2 Days Post-Op Procedure(s) (LRB): LEFT TOTAL SHOULDER ARTHROPLASTY (Left) Up with therapy Pt uncomfortable going home today. Discussed nned to drink fluids minimuize pain meds and ambulate but on;y with assistance.  Kamala Kolton ANDREW 01/10/2015, 9:58 AM

## 2015-01-10 NOTE — Progress Notes (Signed)
Occupational Therapy Treatment Patient Details Name: NEERA TENG MRN: [id number] DOB: 15-Jun-1961 Today's Date: 01/10/2015    History of present illness s/p L TSA; PMH: HTN, COPD, arthritis, asthma, seizures, chronic pain, h/o DVT after R  knee surgery   OT comments  Pt seen today for therapeutic exercises and ADLs. Pt continues to be limited by dizziness and was able to ambulate to hallway, however needed to sit suddenly due to dizziness. Due to dizziness, limited R knee ROM, and Left TSA, feel that pt is a fall risk. Pt will be at home alone most of the day. OT is now recommending SNF as safest d/c venue. Acute OT will continue to address OT goals and assist with d/c planning as pt progresses. BP and vitals stable throughout session.    Follow Up Recommendations  SNF;Supervision/Assistance - 24 hour    Equipment Recommendations  3 in 1 bedside comode;Tub/shower bench    Recommendations for Other Services      Precautions / Restrictions Precautions Precautions: Shoulder;Fall Type of Shoulder Precautions: NWB; elbow, wrist, hand AROM; shoulder FF and ER AAROM within the limits of pain; pendulums Veverly Fells Protocol) Shoulder Interventions: Shoulder sling/immobilizer;Off for dressing/bathing/exercises Precaution Comments: reviewed handout with spouse present Required Braces or Orthoses: Sling Restrictions Weight Bearing Restrictions: No LUE Weight Bearing: Non weight bearing       Mobility Bed Mobility               General bed mobility comments: Pt ambulating from bathroom when OT arrived- reaching for door frame. Provided hand held assist  Transfers Overall transfer level: Needs assistance Equipment used: 1 person hand held assist Transfers: Sit to/from Stand Sit to Stand: Min assist         General transfer comment: Min A to stand. Pt continues to be dizzy when ambulating and limited to very short distances. Feel that pt is a fall risk.         ADL Overall  ADL's : Needs assistance/impaired     Grooming: Sitting;Minimal assistance                   Toilet Transfer: Minimal assistance;Ambulation Toilet Transfer Details (indicate cue type and reason): 1 person HHA         Functional mobility during ADLs: Minimal assistance (Hand held assist) General ADL Comments: Functional mobility continues to be limited by dizziness. Pt attempted to ambulate to hallway, however once in hallway pt too dizzy to continue and had to sit. Strongly concerned about pt's safety and independence at home, as she will be alone most of the day.                 Cognition  Arousal/Alertness: Awake/Alert Behavior During Therapy: WFL for tasks assessed/performed Overall Cognitive Status: Within Functional Limits for tasks assessed                         Exercises Shoulder Exercises Pendulum Exercise:  (deferred due to safety concerns with balance) Shoulder Flexion: AAROM;Left;10 reps;Seated (via lap slides) Shoulder Extension: AAROM;Left;10 reps;Seated (via lap slides) Shoulder External Rotation: AAROM;Left;10 reps;Seated (therapist assist) Elbow Flexion: AAROM;Left;10 reps;Seated (pt used RUE to assist) Elbow Extension: AAROM;Left;10 reps;Seated (pt used RUE to assist) Wrist Flexion: AROM;Left;10 reps;Seated Wrist Extension: AROM;Left;10 reps;Seated Digit Composite Flexion: AROM;Left;10 reps;Seated Composite Extension: AROM;Left;10 reps;Seated Donning/doffing shirt without moving shoulder: Maximal assistance Donning/doffing sling/immobilizer: Maximal assistance Correct positioning of sling/immobilizer: Maximal assistance Pendulum exercises (written home exercise program):  (not  completed due to safety concern with balance) ROM for elbow, wrist and digits of operated UE: Minimal assistance (for elbow ROM due to pain) Proper positioning of operated UE when showering: Moderate assistance   Shoulder Instructions Shoulder  Instructions Donning/doffing shirt without moving shoulder: Maximal assistance Donning/doffing sling/immobilizer: Maximal assistance Correct positioning of sling/immobilizer: Maximal assistance Pendulum exercises (written home exercise program):  (not completed due to safety concern with balance) ROM for elbow, wrist and digits of operated UE: Minimal assistance (for elbow ROM due to pain) Proper positioning of operated UE when showering: Moderate assistance          Pertinent Vitals/ Pain       Pain Assessment: 0-10 Pain Score: 7  Pain Location: L shoulder Pain Descriptors / Indicators: Aching;Constant;Sharp;Grimacing Pain Intervention(s): Limited activity within patient's tolerance;Monitored during session;Repositioned;Premedicated before session;Ice applied         Frequency Min 3X/week     Progress Toward Goals  OT Goals(current goals can now be found in the care plan section)  Progress towards OT goals: Progressing toward goals (slowly)  Acute Rehab OT Goals Patient Stated Goal: home OT Goal Formulation: With patient Time For Goal Achievement: 01/16/15 Potential to Achieve Goals: Good  Plan Discharge plan needs to be updated       End of Session Equipment Utilized During Treatment: Gait belt;Other (comment) (sling)   Activity Tolerance Patient limited by pain   Patient Left in chair;with call bell/phone within reach   Nurse Communication Other (comment) (change in d/c rec)        Time: 2336-1224 OT Time Calculation (min): 44 min  Charges: OT General Charges $OT Visit: 1 Procedure OT Treatments $Self Care/Home Management : 8-22 mins $Therapeutic Exercise: 23-37 mins  Juluis Rainier 01/10/2015, 11:43 AM  Cyndie Chime, OTR/L Occupational Therapist [phone] (pager)

## 2015-01-10 NOTE — Progress Notes (Signed)
Utilization review completed.  

## 2015-01-10 NOTE — Progress Notes (Signed)
RN helped patient back to bed from chair, placed a new ice bag on patients shoulder, brought patient ice cups with ginger ale, gave patient pain medication, cleaned up bedside table and picked up trash located on the floor. Patient thanked Therapist, sports. Nursing will continue to monitor patients pain and comfort level.

## 2015-01-10 NOTE — Progress Notes (Signed)
Orthopedics Progress Note  Subjective: Patient reports feeling better this PM but still a little dizzy getting up and around.  She does not feel safe going home and would like to go to SNF which has been recommended by PT, OT.  Objective:  Filed Vitals:   01/10/15 1705  BP: 129/73  Pulse: 85  Temp: 99.4 F (37.4 C)  Resp: 18    General: Awake and alert  Musculoskeletal: Left shoulder dressing changed. Wound benign. NVI incl ax nerve Neurovascularly intact  Lab Results  Component Value Date   WBC 3.5* 12/29/2014   HGB 11.7* 01/09/2015   HCT 37.2 01/09/2015   MCV 87.6 12/29/2014   PLT 224 12/29/2014       Component Value Date/Time   NA 138 01/09/2015 0624   K 4.5 01/09/2015 0624   CL 103 01/09/2015 0624   CO2 30 01/09/2015 0624   GLUCOSE 125* 01/09/2015 0624   BUN 9 01/09/2015 0624   CREATININE 0.91 01/09/2015 0624   CALCIUM 9.0 01/09/2015 0624   GFRNONAA 70* 01/09/2015 0624   GFRAA 81* 01/09/2015 0624    Lab Results  Component Value Date   INR 0.97 12/29/2014   INR 2.17* 03/27/2014   INR 3.83* 03/26/2014    Assessment/Plan: POD #2 s/p Procedure(s): LEFT TOTAL SHOULDER ARTHROPLASTY INR supratherapeutic. Adjustment per pharmacy.  Plan is for D/C tomorrow if bed available to SNF rehab. Continue mobilization and pulmonary toilet.  Doran Heater. Veverly Fells, MD 01/10/2015 6:40 PM

## 2015-01-11 NOTE — Progress Notes (Signed)
   Subjective: 3 Days Post-Op Procedure(s) (LRB): LEFT TOTAL SHOULDER ARTHROPLASTY (Left)  Pt c/o mild dizziness when she gets out of bed Mild to moderate pain in left shoulder Therapy recommending SNF placement Patient reports pain as moderate.  Objective:   VITALS:   Filed Vitals:   01/11/15 0417  BP: 110/56  Pulse: 87  Temp: 98.8 F (37.1 C)  Resp: 18    Left shoulder incision healing well nv intact distally Sling in place  LABS  Recent Labs  01/09/15 0624  HGB 11.7*  HCT 37.2     Recent Labs  01/09/15 0624  NA 138  K 4.5  BUN 9  CREATININE 0.91  GLUCOSE 125*     Assessment/Plan: 3 Days Post-Op Procedure(s) (LRB): LEFT TOTAL SHOULDER ARTHROPLASTY (Left) Recommend SNF placement to work on mobility and make sure she is safe to go home Continue PT/OT Pt in agreement     [place], MPAS, PA-C  01/11/2015, 9:49 AM

## 2015-01-11 NOTE — Progress Notes (Signed)
Physical Therapy Treatment Patient Details Name: Amanda Christensen MRN: [id number] DOB: December 07, 1960 Today's Date: 01/11/2015    History of Present Illness s/p L TSA; PMH: HTN, COPD, arthritis, asthma, seizures, chronic pain, h/o DVT after R  knee surgery    PT Comments    Pt seen for vestibular assessment. 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 Pt given VORx1 exercises as well as gaze stability compensatory technique to assist with minimizing dizziness during transfers and ambulation. See other section for details. Pt remains unsafe to return home alone at this time due to increase falls risk and would benefit from vestibular rehab at ST-SNF to address hypofunction and improve dizziness to minimize falls risk.   Follow Up Recommendations  SNF;Supervision/Assistance - 24 hour     Equipment Recommendations       Recommendations for Other Services       Precautions / Restrictions Precautions Precautions: Shoulder;Fall Type of Shoulder Precautions: NWB; elbow, wrist, hand AROM; shoulder FF and ER AAROM within the limits of pain; pendulums Veverly Fells Protocol) Shoulder Interventions: Shoulder sling/immobilizer;Off for dressing/bathing/exercises Required Braces or Orthoses: Sling Restrictions Weight Bearing Restrictions: No LUE Weight Bearing: Non weight bearing    Mobility  Bed Mobility Overal bed mobility: Needs Assistance Bed Mobility: Supine to Sit;Sit to Supine     Supine to sit: HOB elevated;Mod assist Sit to supine: Min assist;HOB elevated   General bed mobility comments: pt assisted into/out of dix hallpike maneuver  Transfers Overall transfer level: Needs assistance Equipment used: None Transfers: Sit to/from Stand Sit to Stand: Min assist         General transfer comment: pt with improved ability to stand with gaze stability exercises  Ambulation/Gait Ambulation/Gait assistance: Min assist Ambulation Distance (Feet): 150 Feet Assistive  device: 1 person hand held assist Gait Pattern/deviations: Step-through pattern Gait velocity: slow and guarded   General Gait Details: due to noted vestibular hypofunction pt given compensatory technique of gaze stabilty and pt able to tolerate increased ambulation with this exercise   Stairs            Wheelchair Mobility    Modified Rankin (Stroke Patients Only)       Balance Overall balance assessment: Needs assistance         Standing balance support: Single extremity supported Standing balance-Leahy Scale: Fair Standing balance comment: improved with gaze stability exercise                    Cognition Arousal/Alertness: Awake/alert Behavior During Therapy: WFL for tasks assessed/performed Overall Cognitive Status: Within Functional Limits for tasks assessed                      Exercises      General Comments General comments (skin integrity, edema, etc.): completed vesitbular assessment: pt with positive vestibular hypofunction however unclear as to which side. Pt negative for nystagmus during horizontal roll test and dix hallpike maneuvers for BBPV assessment. Pt with improved dizziness during transfers and ambulation with gaze stability technique.      Pertinent Vitals/Pain Pain Assessment: 0-10 Pain Score: 9  Pain Location: L shoulder Pain Descriptors / Indicators: Sharp Pain Intervention(s): Patient requesting pain meds-RN notified    Home Living                      Prior Function            PT Goals (current goals can now be found  in the care plan section) Acute Rehab PT Goals Patient Stated Goal: home Progress towards PT goals: Progressing toward goals    Frequency  Min 5X/week    PT Plan Discharge plan needs to be updated    Co-evaluation             End of Session Equipment Utilized During Treatment: Gait belt;Other (comment) Activity Tolerance: Patient tolerated treatment well;Treatment limited  secondary to medical complications (Comment) Patient left: in chair;with call bell/phone within reach;with family/visitor present     Time: 1414-1500 PT Time Calculation (min) (ACUTE ONLY): 46 min  Charges:  $Gait Training: 8-22 mins $Therapeutic Activity: 8-22 mins $Canalith Rep Proc: 8-22 mins                    G Codes:      Kingsley Callander 01/11/2015, 3:20 PM  Kittie Plater, PT, DPT Pager #: [phone] Office #: [phone]

## 2015-01-11 NOTE — Progress Notes (Signed)
Occupational Therapy Treatment Patient Details Name: Amanda Christensen MRN: [id number] DOB: [birth year] Today's Date: 01/11/2015    History of present illness s/p L TSA; PMH: HTN, COPD, arthritis, asthma, seizures, chronic pain, h/o DVT after R  knee surgery   OT comments  Pt c/o feeling like the room is spinning when she moves her head or changes positions. ? If pt has BPPV. Contacted therapist who specializes with BPPV and she is to see pt @ lunchtime to further assess c/o dizziness. Pt progressing with LUE rehab. Continue to follow acutely and assist with D/C planning to appropriate venue.   Follow Up Recommendations  SNF;Supervision/Assistance - 24 hour    Equipment Recommendations  3 in 1 bedside comode;Tub/shower bench    Recommendations for Other Services PT consult;Other (comment) (vestibular eval)    Precautions / Restrictions Precautions Precautions: Shoulder;Fall Type of Shoulder Precautions: NWB; elbow, wrist, hand AROM; shoulder FF and ER AAROM within the limits of pain; pendulums Veverly Fells Protocol) Shoulder Interventions: Shoulder sling/immobilizer;Off for dressing/bathing/exercises Required Braces or Orthoses: Sling Restrictions Weight Bearing Restrictions: No LUE Weight Bearing: Non weight bearing       Mobility Bed Mobility    mod I with heavy use of rails. Pt instructed to use gaze stabilization during mobility. Pt staes it helped a "lettle bit". C/o dizziness resolved after 2 min of sitting.              Transfers    min A                  Balance      min A due to c/o feeling room is spinning                             ADL    Pt instructed on upper body dressing techniques. Educated on proper positioning LUE while sitting. Mod A to donn sling                                            Vision                     Perception     Praxis      Cognition   Behavior During Therapy: [facility] for tasks  assessed/performed Overall Cognitive Status: Within Functional Limits for tasks assessed                       Extremity/Trunk Assessment     c/o LUE feeling "stiff". Pt states she has only completed her wrist exercises on Sunday.  Able to achieve @ 60 FF AAROM supine and 20 ER AAROM LUE supine        Exercises Shoulder Exercises Pendulum Exercise: AAROM;Left;10 reps;Standing Shoulder Flexion: AAROM;Left;10 reps;Supine Shoulder External Rotation: AAROM;Left;10 reps - hold at end range x 10 seconds Elbow Flexion: AROM;AAROM;Left;10 reps;Supine Elbow Extension: AROM;AAROM;Left;Supine Wrist Flexion: Left;AROM;10 reps Wrist Extension: Left;AROM;5 reps Digit Composite Flexion: AROM;Left;10 reps Composite Extension: AROM;Left;10 reps Neck Flexion: AROM;5 reps;Supine Neck Extension: AROM;5 reps;Supine Neck Lateral Flexion - Right: AROM;5 reps;Supine Neck Lateral Flexion - Left: AROM;5 reps;Supine   Shoulder Instructions  see above Allowed to compelte Lshoulder AAROM within pain toleracne     General Comments  Pt with complaints of dizziness. In supine, noted nystagmus with head turning.  Pertinent Vitals/ Pain       Pain Assessment: 0-10 Pain Score: 9  Pain Location: L shoulder Pain Descriptors / Indicators: Sharp Pain Intervention(s): Limited activity within patient's tolerance;Monitored during session;Repositioned;Ice applied  Home Living                                          Prior Functioning/Environment              Frequency Min 3X/week     Progress Toward Goals  OT Goals(current goals can now be found in the care plan section)  Progress towards OT goals: Progressing toward goals  Acute Rehab OT Goals Patient Stated Goal: home OT Goal Formulation: With patient Time For Goal Achievement: 01/16/15 Potential to Achieve Goals: Good ADL Goals Pt Will Perform Upper Body Dressing: with min assist;with adaptive  equipment;sitting Pt Will Perform Lower Body Dressing: with min assist;with adaptive equipment;sit to/from stand Pt Will Transfer to Toilet: with supervision;ambulating Pt Will Perform Toileting - Clothing Manipulation and hygiene: with supervision;sit to/from stand Pt/caregiver will Perform Home Exercise Program: With written HEP provided;Left upper extremity;With minimal assist  Plan Discharge plan remains appropriate    Co-evaluation                 End of Session Equipment Utilized During Treatment: Gait belt   Activity Tolerance Patient tolerated treatment well   Patient Left in chair;with call bell/phone within reach;with family/visitor present   Nurse Communication Precautions;Weight bearing status;Mobility status        Time: 1694-5038 OT Time Calculation (min): 34 min  Charges: OT General Charges $OT Visit: 1 Procedure OT Treatments $Therapeutic Activity: 8-22 mins $Therapeutic Exercise: 8-22 mins  Gohan Collister,HILLARY 01/11/2015, 11:31 AM   Maurie Boettcher, OTR/L  [phone] 01/11/2015

## 2015-01-12 MED ORDER — POLYETHYLENE GLYCOL 3350 17 G PO PACK
17.0000 g | PACK | Freq: Every day | ORAL | Status: DC
  Administered 2015-01-12: 17 g via ORAL
  Filled 2015-01-12: qty 1

## 2015-01-12 NOTE — Progress Notes (Signed)
   Subjective: 4 Days Post-Op Procedure(s) (LRB): LEFT TOTAL SHOULDER ARTHROPLASTY (Left)  Pt resting comfortably in no acute distress Mild pain to left shoulder Denies any new symptoms or issues Patient reports pain as mild.  Objective:   VITALS:   Filed Vitals:   01/12/15 0540  BP: 104/53  Pulse: 89  Temp: 98.2 F (36.8 C)  Resp:     Left shoulder incision healing well nv intact distally No rashes or edema  LABS No results for input(s): HGB, HCT, WBC, PLT in the last 72 hours.  No results for input(s): NA, K, BUN, CREATININE, GLUCOSE in the last 72 hours.   Assessment/Plan: 4 Days Post-Op Procedure(s) (LRB): LEFT TOTAL SHOULDER ARTHROPLASTY (Left) D/c to snf today  F/u in 2 weeks Continue PT/OT    Merla Riches, MPAS, PA-C  01/12/2015, 10:14 AM

## 2015-01-12 NOTE — Discharge Summary (Signed)
Physician Discharge Summary   Patient ID: Amanda Christensen MRN: [id number] DOB/AGE: 1961/07/25 54 y.o.  Admit date: 01/08/2015 Discharge date: 01/12/2015  Admission Diagnoses:  Active Problems:   S/P shoulder replacement   Discharge Diagnoses:  Same   Surgeries: Procedure(s): LEFT TOTAL SHOULDER ARTHROPLASTY on 01/08/2015   Consultants: PT/OT  Discharged Condition: Stable  Hospital Course: Amanda Christensen is an 54 y.o. female who was admitted 01/08/2015 with a chief complaint of No chief complaint on file. , and found to have a diagnosis of <principal problem not specified>.  They were brought to the operating room on 01/08/2015 and underwent the above named procedures.    The patient had an uncomplicated hospital course and was stable for discharge.  Recent vital signs:  Filed Vitals:   01/12/15 0540  BP: 104/53  Pulse: 89  Temp: 98.2 F (36.8 C)  Resp:     Recent laboratory studies:  Results for orders placed or performed during the hospital encounter of 01/08/15  Hemoglobin and hematocrit, blood  Result Value Ref Range   Hemoglobin 11.7 (L) 12.0 - 15.0 g/dL   HCT 37.2 36.0 - 78.2 %  Basic metabolic panel  Result Value Ref Range   Sodium 138 135 - 145 mmol/L   Potassium 4.5 3.5 - 5.1 mmol/L   Chloride 103 96 - 112 mmol/L   CO2 30 19 - 32 mmol/L   Glucose, Bld 125 (H) 70 - 99 mg/dL   BUN 9 6 - 23 mg/dL   Creatinine, Ser 0.91 0.50 - 1.10 mg/dL   Calcium 9.0 8.4 - 10.5 mg/dL   GFR calc non Af Amer 70 (L) >90 mL/min   GFR calc Af Amer 81 (L) >90 mL/min   Anion gap 5 5 - 15    Discharge Medications:     Medication List    STOP taking these medications        predniSONE 10 MG tablet  Commonly known as:  DELTASONE      TAKE these medications        albuterol 108 (90 BASE) MCG/ACT inhaler  Commonly known as:  PROVENTIL HFA;VENTOLIN HFA  Inhale 1-2 puffs into the lungs every 6 (six) hours as needed for wheezing or shortness of breath.     cetirizine 10 MG  tablet  Commonly known as:  ZYRTEC  Take 10 mg by mouth at bedtime.     cyclobenzaprine 5 MG tablet  Commonly known as:  FLEXERIL  Take 1 tablet (5 mg total) by mouth 3 (three) times daily as needed for muscle spasms.     diclofenac sodium 1 % Gel  Commonly known as:  VOLTAREN  Apply 2 g topically daily as needed (muscle pain).     fluticasone 50 MCG/ACT nasal spray  Commonly known as:  FLONASE  Place 1 spray into both nostrils daily as needed for allergies or rhinitis.     HYDROcodone-acetaminophen 5-325 MG per tablet  Commonly known as:  NORCO/VICODIN  Take 1 tablet by mouth daily.     methocarbamol 500 MG tablet  Commonly known as:  ROBAXIN  Take 1 tablet (500 mg total) by mouth 3 (three) times daily as needed.     montelukast 10 MG tablet  Commonly known as:  SINGULAIR  Take 10 mg by mouth at bedtime.     MULTIVITAMIN PO  Take 1 tablet by mouth daily.     oxyCODONE-acetaminophen 5-325 MG per tablet  Commonly known as:  ROXICET  Take 1-2 tablets  by mouth every 4 (four) hours as needed for severe pain.     traZODone 100 MG tablet  Commonly known as:  DESYREL  Take 100 mg by mouth at bedtime.     verapamil 240 MG 24 hr capsule  Commonly known as:  VERELAN PM  Take 240 mg by mouth at bedtime.     VITAMIN C PO  Take 1 tablet by mouth daily.     VITAMIN D PO  Take 1 tablet by mouth daily.        Diagnostic Studies: Dg Chest 2 View  12/15/2014   CLINICAL DATA:  Chest pain and difficulty breathing for 1 week  EXAM: CHEST  2 VIEW  COMPARISON:  November 24, 2014 and August 10, 2014  FINDINGS: The interstitium is mildly prominent without edema or consolidation. Heart size and pulmonary vascularity are within normal limits. No adenopathy. No bone lesions.  IMPRESSION: Mild interstitial prominence, most likely reflecting chronic inflammatory type change. No frank edema or consolidation.   Electronically Signed   By: Lowella Grip M.D.   On: 12/15/2014 13:42   Dg  Shoulder Left  01/08/2015   CLINICAL DATA:  Postop from left shoulder arthroplasty.  EXAM: LEFT SHOULDER - 2+ VIEW  COMPARISON:  None.  FINDINGS: A left shoulder prosthesis is seen in expected position. No evidence of fracture or dislocation.  IMPRESSION: Expected postoperative appearance of left shoulder prosthesis.   Electronically Signed   By: Earle Gell M.D.   On: 01/08/2015 11:53    Disposition: 01-Home or Self Care      Discharge Instructions    Call MD / Call 911    Complete by:  As directed   If you experience chest pain or shortness of breath, CALL 911 and be transported to the hospital emergency room.  If you develope a fever above 101 F, pus (white drainage) or increased drainage or redness at the wound, or calf pain, call your surgeon's office.     Constipation Prevention    Complete by:  As directed   Drink plenty of fluids.  Prune juice may be helpful.  You may use a stool softener, such as Colace (over the counter) 100 mg twice a day.  Use MiraLax (over the counter) for constipation as needed.     Diet - low sodium heart healthy    Complete by:  As directed      Increase activity slowly as tolerated    Complete by:  As directed            Follow-up Information    Follow up with NORRIS,STEVEN R, MD. Call in 2 weeks.   Specialty:  Orthopedic Surgery   Why:  [phone]   Contact information:   [address] [place] [address] [phone]        Signed: Ventura Bruns 01/12/2015, 7:31 AM

## 2015-01-12 NOTE — Progress Notes (Signed)
Occupational Therapy Treatment Patient Details Name: Amanda Christensen MRN: [id number] DOB: [birth year] Today's Date: 01/12/2015    History of present illness s/p L TSA; PMH: HTN, COPD, arthritis, asthma, seizures, chronic pain, h/o DVT after R  knee surgery   OT comments  Pt making steady progress. Long discussion with pt regarding D/C concerns. Pt does not want to D/C to SNF. Pt able to arrange enough support for safe D/C home. Pt has PCA at least 3 hrs/day. Pt will need HHOT/PT, 3 in 1 and tub bench, in addition to short base quad cane for D/C home. Discussed with SW and case Freight forwarder.   Follow Up Recommendations  Home health OT;Supervision/Assistance - 24 hour (close to 24/7 initially)    Equipment Recommendations  3 in 1 bedside comode;Tub/shower bench    Recommendations for Other Services      Precautions / Restrictions Precautions Precautions: Shoulder;Fall Type of Shoulder Precautions: NWB; elbow, wrist, hand AROM; shoulder FF and ER AAROM within the limits of pain; pendulums Veverly Fells Protocol) Shoulder Interventions: Shoulder sling/immobilizer;Off for dressing/bathing/exercises Precaution Booklet Issued: Yes (comment) Precaution Comments: reviewed handout with patient Restrictions Weight Bearing Restrictions: No LUE Weight Bearing: Non weight bearing       Mobility Bed Mobility Overal bed mobility: Needs Assistance Bed Mobility: Supine to Sit     Supine to sit: Supervision     General bed mobility comments: Pt c/o dizziness with initially sitting EOB.   Transfers Overall transfer level: Needs assistance Equipment used: None Transfers: Sit to/from [facility] Sit to Stand: Supervision Stand pivot transfers: Supervision       General transfer comment: Pt holding onto chair for bed to chair trasnfer    Balance Overall balance assessment: Needs assistance         Standing balance support: Single extremity supported Standing balance-Leahy  Scale: Fair                     ADL Overall ADL's : Needs assistance/impaired                                Did not complain about dizziness when up and walking. States it seems to be a "little better".     Functional mobility during ADLs: Min guard General ADL Comments: Pt issued dressing stick. sock aid, long handled sponge and toilet aid to increase her safety and independence with ADL to facilitate safe D/C home. Reviewed UB dressing/bathing techniques. Pt verbalizes understanding. Pt states she has an aid 3hrs/day to assist her with ADL. Discussed using 3 in 1 by recliner and tub bench for transfers. Recommended to only walk with family until dizziness "resolves" Pt discussing with family to work out assistance at home as needed. Pt ready for D/C once home needs set up.                                       Cognition   Behavior During Therapy: WFL for tasks assessed/performed Overall Cognitive Status: Within Functional Limits for tasks assessed                       Extremity/Trunk Assessment   LUE A/AAROM improving AAROM FF to 75 ER to 20 Able to complete hand to "ear/mouth" using AAROM  Exercises Shoulder Exercises Pendulum Exercise: AAROM;Left;Standing Shoulder Flexion: AAROM;Left;10 reps;Supine Shoulder External Rotation: AAROM;Left;10 reps;Seated "lap slides" Elbow Flexion: AAROM;10 reps;Supine (with dowell) Elbow Extension: AROM;Left;15 reps Wrist Flexion: AROM;Left;15 reps Wrist Extension: AROM;Left;10 reps Digit Composite Flexion: AROM;Left;10 reps Composite Extension: AROM;Left;10 reps Neck Flexion: AROM;10 reps Donning/doffing shirt without moving shoulder: Moderate assistance Method for sponge bathing under operated UE: Patient able to independently direct caregiver Donning/doffing sling/immobilizer: Minimal assistance Correct positioning of sling/immobilizer: Minimal assistance Pendulum exercises  (written home exercise program): Supervision/safety ROM for elbow, wrist and digits of operated UE: Supervision/safety Sling wearing schedule (on at all times/off for ADL's): Supervision/safety Proper positioning of operated UE when showering: Supervision/safety Positioning of UE while sleeping: Supervision/safety   Shoulder Instructions Shoulder Instructions Donning/doffing shirt without moving shoulder: Moderate assistance Method for sponge bathing under operated UE: Patient able to independently direct caregiver Donning/doffing sling/immobilizer: Minimal assistance Correct positioning of sling/immobilizer: Minimal assistance Pendulum exercises (written home exercise program): Supervision/safety ROM for elbow, wrist and digits of operated UE: Supervision/safety Sling wearing schedule (on at all times/off for ADL's): Supervision/safety Proper positioning of operated UE when showering: Supervision/safety Positioning of UE while sleeping: Supervision/safety     General Comments      Pertinent Vitals/ Pain       Pain Assessment: 0-10 Pain Score: 5  Pain Location: L shoulder Pain Descriptors / Indicators: Aching Pain Intervention(s): Limited activity within patient's tolerance;Monitored during session;Repositioned                                                          Frequency Min 3X/week     Progress Toward Goals  OT Goals(current goals can now be found in the care plan section)  Progress towards OT goals: Progressing toward goals  Acute Rehab OT Goals Patient Stated Goal: home OT Goal Formulation: With patient Time For Goal Achievement: 01/16/15 Potential to Achieve Goals: Good ADL Goals Pt Will Perform Upper Body Dressing: with min assist;with adaptive equipment;sitting Pt Will Perform Lower Body Dressing: with min assist;with adaptive equipment;sit to/from stand Pt Will Transfer to Toilet: with supervision;ambulating Pt Will Perform  Toileting - Clothing Manipulation and hygiene: with supervision;sit to/from stand Pt/caregiver will Perform Home Exercise Program: With written HEP provided;Left upper extremity;With minimal assist  Plan Discharge plan needs to be updated    Co-evaluation                 End of Session     Activity Tolerance Patient tolerated treatment well   Patient Left in chair;with call bell/phone within reach   Nurse Communication Mobility status;Other (comment);Weight bearing status;Precautions (D/C plans)        Time: 5102-5852 OT Time Calculation (min): 54 min  Charges: OT General Charges $OT Visit: 1 Procedure OT Treatments $Self Care/Home Management : 23-37 mins $Therapeutic Activity: 8-22 mins $Therapeutic Exercise: 8-22 mins  Lyriq Finerty,HILLARY 01/12/2015, 11:24 AM   Maurie Boettcher, OTR/L  [phone] 01/12/2015

## 2015-01-12 NOTE — Clinical Social Work Note (Signed)
CSW met with patient at bedside to discuss [facility] bed offers. Patient informed CSW patient will be discharging home with home health services. Per patient, patient's son and daughter are able to assist with patient's care once discharged home. Patient reports patient's son to provide transportation at time of discharge.  CSW updated RNCM regarding change in discharge disposition. CSW signing off.  Lubertha Sayres, [place] [phone]) Licensed Clinical Social Worker Orthopedics [phone]) and Surgical [phone])

## 2015-01-12 NOTE — Progress Notes (Signed)
Physical Therapy Treatment Patient Details Name: Amanda Christensen MRN: [id number] DOB: [birth year] Today's Date: 01/12/2015    History of Present Illness s/p L TSA; PMH: HTN, COPD, arthritis, asthma, seizures, chronic pain, h/o DVT after R  knee surgery.  Post op course complicated by vertigo/dizziness.     PT Comments    Pt is starting to implement compensatory strategies for her vertigo, and is moving slowly with min guard assist and quad cane.  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  Add to that narcotic pain meds, and a h/o of strabismus as a child and she is likely a little more susceptible to vertigo than most patients.  Continue compensatory strategies and x 1 seated VOR exercises.  PT will continue to follow acutely and recommend that the HHPT be a vestibular trained therapist if able.  Pt reported to OT that family is stepping in to provide care at discharge.    Follow Up Recommendations  Home health PT;Supervision/Assistance - 24 hour (if able vestibular therapist at home)     Equipment Recommendations  Cane;3in1 (PT)    Recommendations for Other Services   NA     Precautions / Restrictions Precautions Precautions: Shoulder;Fall Type of Shoulder Precautions: NWB; elbow, wrist, hand AROM; shoulder FF and ER AAROM within the limits of pain; pendulums Amanda Christensen Protocol) Shoulder Interventions: Shoulder sling/immobilizer;Off for dressing/bathing/exercises Required Braces or Orthoses: Sling Restrictions LUE Weight Bearing: Non weight bearing    Mobility  Bed Mobility Overal bed mobility: Needs Assistance Bed Mobility: Supine to Sit     Supine to sit: Supervision Sit to supine: Supervision   General bed mobility comments: supervision for safety.  Pt relying on bed rail for leverage to get to sitting EOB.  Reports she feels like she is on a boat  (swaing)_once sitting up.   Transfers Overall transfer level: Needs assistance Equipment used: Quad cane Transfers: Sit to/from Stand Sit to Stand: Supervision         General transfer comment: supervision for safety due to reports of dizziness, slow transitions. reliance on hands for balance.  Pt needs a few seconds to find her target and let the dizziness subside before continuing on with gait.   Ambulation/Gait Ambulation/Gait assistance: Min guard Ambulation Distance (Feet): 100 Feet Assistive device: Quad cane Gait Pattern/deviations: Step-to pattern Gait velocity: decreased Gait velocity interpretation: Below normal speed for age/gender General Gait Details: Pt with step to pattern.  Using target finding in hallway to limit dizziness during gait.  Guarded and elevated shoulders increasing neck tension and HA.  Min guard assist for safety and the occational 2-3 small stepping LOB.            Balance Overall balance assessment: Needs assistance Sitting-balance support: Feet supported;No upper extremity supported Sitting balance-Leahy Scale: Good     Standing balance support: Single extremity supported Standing balance-Leahy Scale: Fair                      Cognition Arousal/Alertness: Awake/alert Behavior During Therapy: WFL for tasks assessed/performed Overall Cognitive Status: Within Functional Limits for tasks assessed                         General Comments General comments (skin integrity, edema, etc.): Pt reports compliance wiht x1 exercises.  Further vestibular testing preformed.  Grossly, right ear with  decreased hearing compared to left ear, also, reports of fullness in the ear (no ringing).  Pt reports recent URI/sinus infection 2 weeks ago that she took an antibiotic.  Occulomotor test revealed normal tracking (no nystagmus or saccades), but (+) increase in symptoms while tracking.  VOR head shaking horizontal much more difficult and  symptomatic than vertical and significantly slower than normal. She can maintain focus, but it makes her dizzy and she has to preform slowly.  Also, of note, pt reports she has a h/o strbismus as a child (never surgically corrected) and may have some baseline asymmetry.  Saccadic testing with some undershooting in all directions.  No reports of double vision right now, but (+) blurry vision (she wears glasses to read).  BPPV testing preformed yesterday and was (-).  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  This is complicated by use of pain medication and a baseline h/o strabismus.  As she both completely gets over the sinus infection, is able to tolerate non-narcotic pain medication and continues to move around, I believe that the dizziness with subside.  Compensatory techniques and exercises to supress the sensation of dizziness/strengthen her vestibular system have been initiated and pt is implementing them well.       Pertinent Vitals/Pain Pain Assessment: 0-10 Pain Score: 5  Pain Location: left shoulder, head Pain Descriptors / Indicators: Aching Pain Intervention(s): Limited activity within patient's tolerance;Monitored during session;Repositioned           PT Goals (current goals can now be found in the care plan section) Acute Rehab PT Goals Patient Stated Goal: home Time For Goal Achievement: 01/16/15 Progress towards PT goals: Progressing toward goals    Frequency  Min 5X/week    PT Plan Discharge plan needs to be updated       End of Session Equipment Utilized During Treatment: Other (comment) (left arm sling) Activity Tolerance: Other (comment) (limited by dizziness. ) Patient left: in bed;with call bell/phone within reach     Time: 1413-1439 PT Time Calculation (min) (ACUTE ONLY): 26 min  Charges:  $Gait Training: 8-22 mins $Therapeutic Activity: 8-22 mins                      Amanda Christensen  B. [place], [place], DPT [phone]   01/12/2015, 3:02 PM

## 2015-01-12 NOTE — Clinical Social Work Placement (Signed)
Clinical Social Work Department CLINICAL SOCIAL WORK PLACEMENT NOTE 01/12/2015  Patient:  Amanda Christensen, Amanda Christensen  Account Number:  [id number] [place] date:  01/08/2015  Clinical Social Worker:  Durward Fortes, CLINICAL SOCIAL WORKER  Date/time:  01/12/2015 11:17 AM  Clinical Social Work is seeking post-discharge placement for this patient at the following level of care:   SKILLED NURSING   (*CSW will update this form in Epic as items are completed)   01/12/2015  Patient/family provided with [place] Department of Clinical Social Work's list of facilities offering this level of care within the geographic area requested by the patient (or if unable, by the patient's family).  01/12/2015  Patient/family informed of their freedom to choose among providers that offer the needed level of care, that participate in Medicare, Medicaid or managed care program needed by the patient, have an available bed and are willing to accept the patient.  01/12/2015  Patient/family informed of [facility]' ownership interest in [facility], as well as of the fact that they are under no obligation to receive care at this facility.  PASARR submitted to EDS on 01/12/2015 PASARR number received on 01/12/2015  FL2 transmitted to all facilities in geographic area requested by pt/family on  01/12/2015 FL2 transmitted to all facilities within larger geographic area on 01/12/2015  Patient informed that his/her managed care company has contracts with or will negotiate with  certain facilities, including the following:     Patient/family informed of bed offers received:  01/12/2015 Patient chooses bed at  Physician recommends and patient chooses bed at    Patient to be transferred to  on  01/12/2015 Patient to be transferred to facility by [facility] Patient and family notified of transfer on 01/12/2015 Name of family member notified:    The following physician request were entered in Epic:   Additional  Comments:  Aidenjames Heckmann S. Cyara Devoto, BSW-Intern

## 2015-01-12 NOTE — Care Management Note (Signed)
    Page 1 of 2   01/12/2015     2:28:07 PM CARE MANAGEMENT NOTE 01/12/2015  Patient:  Amanda Christensen, Amanda Christensen   Account Number:  [id number]  Date Initiated:  01/12/2015  Documentation initiated by:  Lorne Skeens  Subjective/Objective Assessment:   Patient was admitted for left shoulder osteoarthritis. Lives at home. Has a Medicaid personal care assistant 3 hrs daily.     Action/Plan:   Will follow for discharge needs.   Anticipated DC Date:  01/12/2015   Anticipated DC Plan:  SKILLED NURSING FACILITY  In-house referral  Clinical Social Worker      DC Planning Services  CM consult      Choice offered to / List presented to:  C-1 Patient   DME arranged  3-N-1  [place]      DME agency  [place] arranged  [facility]   Status of service:  Completed, signed off Medicare Important Message given?   (If response is "NO", the following Medicare IM given date fields will be blank) Date Medicare IM given:   Medicare IM given by:   Date Additional Medicare IM given:   Additional Medicare IM given by:    Discharge Disposition:  [place]  Per UR Regulation:  Reviewed for med. necessity/level of care/duration of stay  If discussed at [place] of Stay Meetings, dates discussed:    Comments:  01/12/15 [place], MSN, CM- Met with patient to discuss discharge planning. Patient is declining SNF placement at this time, preferring to discharge home with home health.  [facility] was notified of need for equipment prior to discharge.  Patient has chosen [facility]. Mary with [facility] was notified and has accepted the referral for discharge home today.
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
## 2015-02-11 ENCOUNTER — Ambulatory Visit: Payer: Medicaid Other | Attending: Physician Assistant | Admitting: Physical Therapy

## 2015-02-11 DIAGNOSIS — M25512 Pain in left shoulder: Secondary | ICD-10-CM | POA: Insufficient documentation

## 2015-02-11 DIAGNOSIS — R29898 Other symptoms and signs involving the musculoskeletal system: Secondary | ICD-10-CM | POA: Diagnosis not present

## 2015-02-11 DIAGNOSIS — M25612 Stiffness of left shoulder, not elsewhere classified: Secondary | ICD-10-CM

## 2015-02-11 DIAGNOSIS — R293 Abnormal posture: Secondary | ICD-10-CM | POA: Insufficient documentation

## 2015-02-11 NOTE — Therapy (Signed)
Amanda Christensen [place], [place], [address] Phone: [phone]   Fax:  [phone]  Physical Therapy Evaluation  Patient Details  Name: Amanda Christensen MRN: [id number] Date of Birth: 03/04/1961 Referring Provider:  Merla Riches, PA-C  Encounter Date: 02/11/2015      PT End of Session - 02/11/15 1731    Visit Number 1   Number of Visits 4   Date for PT Re-Evaluation 04/08/15   PT Start Time 1630   PT Stop Time 1718   PT Time Calculation (min) 48 min   Activity Tolerance Patient tolerated treatment well;Patient limited by pain   Behavior During Therapy [facility] for tasks assessed/performed      Past Medical History  Diagnosis Date  . Hypertension   . Depression   . Arthritis   . COPD (chronic obstructive pulmonary disease)   . Heart murmur   . 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 Obesity   . Chronic pain   . Peripheral vascular disease 10    rt leg dvt   . Shortness of breath dyspnea   . Asthma   . Seizures 03    was on medications none now off  since 11    Past Surgical History  Procedure Laterality Date  . Cesarean section    . Total knee arthroplasty Right   . Rotator cuff repair Left   . Joint replacement    . Hernia repair      umbilical  . Miscarriage    . Dilation and curettage of uterus    . Total shoulder arthroplasty Left 01/08/2015    dr Veverly Fells  . Total shoulder arthroplasty Left 01/08/2015    Procedure: LEFT TOTAL SHOULDER ARTHROPLASTY;  Surgeon: Augustin Schooling, MD;  Location: [place];  Service: Orthopedics;  Laterality: Left;    There were no vitals filed for this visit.  Visit Diagnosis:  Left shoulder pain - Plan: PT plan of care cert/re-cert  Weakness of left arm - Plan: PT plan of care cert/re-cert  Decreased ROM of left shoulder - Plan: PT plan of care cert/re-cert  Abnormal posture - Plan: PT plan of care cert/re-cert      Subjective  Assessment - 02/11/15 1638    Symptoms pt is a 54 y.o F with CC of Left shoulder pain s/p left TSR on 01/08/2015. she reports she had some complications with vertigo following the surgery, and didn't want to come to OPPT and recieved HHPT. she stated she had  previous RC surgery 2007.   Limitations Lifting;Standing;Walking;House hold activities;Sitting   How long can you sit comfortably? 30 min   How long can you stand comfortably? 10 min   How long can you walk comfortably? 10 min   Diagnostic tests x-ray per pt report impression was messed up rotator cuff tear.    Patient Stated Goals to be able to move shoulder as much as possible,    Currently in Pain? Yes   Pain Score 4   pt took medication for pain prior to todays visit   Pain Location Shoulder   Pain Orientation Left;Anterior;Posterior;Lateral   Pain Descriptors / Indicators Stabbing;Sharp;Other (Comment)  heaviness   Pain Type Surgical pain   Pain Onset More than a month ago   Pain Frequency Constant   Aggravating Factors  putting on clothes, movement in general    Pain Relieving Factors pain medication, ice   Effect  of Pain on Daily Activities limited strength and AROM   Multiple Pain Sites Yes   Pain Score 2   Pain Location Knee  hx or R TKR   Pain Orientation Right   Pain Descriptors / Indicators Aching   Pain Type Chronic pain   Pain Onset More than a month ago   Pain Frequency Constant            [facility] PT Assessment - 02/11/15 1649    Assessment   Medical Diagnosis s/p left shoulder replacement   Onset Date 01/08/15   Next MD Visit 02/24/2015   Prior Therapy yes, left shoulder, knee, and back   Precautions   Precautions None   Precaution Comments stay in sling when leaving the house, no external rotation,   Required Braces or Orthoses Other Brace/Splint   Other Brace/Splint left shoulder sling   Restrictions   Weight Bearing Restrictions No   Balance Screen   Has the patient fallen in the past 6 months No    Has the patient had a decrease in activity level because of a fear of falling?  No   Is the patient reluctant to leave their home because of a fear of falling?  No   Home Environment   Living Enviornment Private residence   Living Arrangements Children   Available Help at Discharge Other (Comment);Personal care attendant  daughter   Type of [place] to enter   Entrance Stairs-Number of Steps 3  1 step in the back   Entrance Stairs-Rails None   Home Layout One level   [place] - 2 wheels;Shower seat;Cane - single point;Cane - quad   Prior Function   Level of Independence Independent with basic ADLs;Independent with homemaking with ambulation;Independent with gait;Independent with transfers   Vocation On disability   Leisure singing and shopping, spending time with family   Observation/Other Assessments   Focus on Therapeutic Outcomes (FOTO)  75% limited (predicted 42%)   Sensation   Light Touch Appears Intact  impaired light touch sensation for LUE ant shldr   Posture/Postural Control   Posture/Postural Control Postural limitations   Postural Limitations Rounded Shoulders;Forward head;Increased lumbar lordosis   ROM / Strength   AROM / PROM / Strength AROM;PROM;Strength   AROM   Overall AROM  Other (comment)  R shoulder within functional limits   AROM Assessment Site Shoulder   Right/Left Shoulder Left;Right   Left Shoulder Flexion 10 Degrees  supine   Left Shoulder ABduction 41 Degrees  supine   Left Shoulder Internal Rotation 76 Degrees  AAROM with gravity; 45 degrees abduction   Left Shoulder External Rotation 0 Degrees  45 degrees abduction   PROM   Overall PROM Comments RUE shoulder PROM is WFL;   PROM Assessment Site Shoulder   Right/Left Shoulder Left   Left Shoulder Flexion 93 Degrees  supine   Left Shoulder ABduction 85 Degrees  supine; with significant shoulder hiking   Left Shoulder External Rotation 2 Degrees  45  degrees abduction;   Strength   Overall Strength Due to pain  pt limited with AROM in supine with gravity limited   Right/Left hand Left;Right   Grip (lbs) right: 22, Left 10   Palpation   Palpation tenderness located at the L upper trap, distal clavical greater tubercle, and surroung musculature  PT Education - 02/11/15 1729    Education provided Yes   Education Details evaluation findings, POC, goals, HEP   Person(s) Educated Patient   Methods Explanation   Comprehension Verbalized understanding          PT Short Term Goals - 02/11/15 1740    PT SHORT TERM GOAL #1   Title pt will be I with basic/advanced HEP (02/25/2015)   Baseline minimal home exercise program from home health physical therapy   Time 4   Period Weeks   Status New   PT SHORT TERM GOAL #2   Title pt will be able to verbalize and demonstrate techniques to reduce L shoulder inflammation via the RICE method (02/25/2015)   Baseline pt has trouble controlling pain without the use of pain medication   Time 4   Period Weeks   Status New           PT Long Term Goals - 02/11/15 1742    PT LONG TERM GOAL #1   Title Pt will increase L shoulder flexion/abduction AROM to > 45 degrees to assist with donning/ doffing clothing (04/08/2015)   Baseline pt AROM for flexion was 10 degrees and abduction was 41 degrees   Time 8   Period Weeks   Status New   PT LONG TERM GOAL #2   Title pt will increase decrease L shoulder pain to <6/10 without pain medication to assist with exercise progression (04/08/2015)   Baseline pt took pain medication before therapy pain was at 4/10   Time 8   Period Weeks   Status New   PT LONG TERM GOAL #3   Title Pt will increase L grip strength to >14 lbs to assist with ADLs and functional progression (04/08/2015)   Baseline pts L grip strength was 10 lbs.    Time 8   Period Weeks   Status New   PT LONG TERM GOAL #4   Title pt will increase FOTO  score by >8 points to help with functional capcity (04/08/2015)   Time 8   Period Weeks   Status New   PT LONG TERM GOAL #5   Title pt will be able to verbalize and demonstrate techniques to reduce risk or reinjury of the Left shoulder by proper lifting mechanics, pain control, HEP (04/08/2015)   Baseline pt demonsrates abnormal posture/ lifting mechanics, and limited pain control    Time 8   Period Weeks   Status New               Plan - 02/11/15 1732    Clinical Impression Statement Sammy presents to OPPT s/p L TSR on 01/08/2015.  She demonstrates limited AROM of the left shoulder due to pain and muscle weakness, she was able to tolerate more PROM but with increased pain at end ranges with significant guarding. the incision site appears intact and healing well. Due to pt being medicaid and has only 4 visits may try to see if following physical therapy she can get in at [facility] clinic for continued treatment. Todays evaluation was limited due to pain and limted AROM/PROM of the L shoulder. she would benefit from skill physical therapy to maxmize her function and decreased pain by address the impairments listed.    Pt will benefit from skilled therapeutic intervention in order to improve on the following deficits Difficulty walking;Decreased range of motion;Decreased endurance;Decreased activity tolerance;Decreased strength;Decreased mobility;Pain;Postural dysfunction;Impaired UE functional use   Rehab Potential Good   PT Frequency Biweekly  PT Duration 8 weeks   PT Treatment/Interventions ADLs/Self Care Home Management;Moist Heat;Therapeutic activities;Patient/family education;Scar mobilization;Therapeutic exercise;Passive range of motion;Manual techniques;Ultrasound;Cryotherapy;Neuromuscular re-education;Electrical Stimulation   PT Next Visit Plan assess response to HEP, PROM/AAROM of L shoulder, modalities for pain,    PT Home Exercise Plan supine/ sitting AAROM wand exercises,  pendulums   Consulted and Agree with Plan of Care Patient         Problem List Patient Active Problem List   Diagnosis Date Noted  . S/P shoulder replacement 01/08/2015  . Acute renal failure 08/02/2014  . Rhabdomyolysis 08/02/2014  . Hypotension 08/02/2014  . Lactic acidosis 08/02/2014  . Arthritis 08/02/2014  . Hyperlipidemia 08/02/2014  . Asthma 08/02/2014  . Chest pain 03/26/2014  . COPD (chronic obstructive pulmonary disease) 03/25/2014  . Hypertension 03/25/2014  . Chest tightness or pressure 03/25/2014  . Supratherapeutic INR 03/25/2014  . Osteoarthritis 03/25/2014  . History of DVT (deep vein thrombosis) 03/25/2014   Starr Lake PT, DPT, LAT, ATC  02/11/2015  5:57 PM   [place] [facility] [address] [place], [place], [address] Phone: [phone]   Fax:  [phone]

## 2015-02-11 NOTE — Patient Instructions (Addendum)
Lateral Reach With Wand   Reach wand as far as possible to one side, and then to the other. Hold __2-3__ seconds each position. Repeat _10___ times. Do _2___ sessions per day.  Copyright  VHI. All rights reserved.  ROM: Abduction - Wand   Holding wand with left hand palm up, push wand directly out to side, leading with other hand palm down, until stretch is felt. Hold ____ seconds. Repeat _10___ times per set. Do _2__ sets per session. Do _2___ sessions per day.  http://orth.exer.us/747   Copyright  VHI. All rights reserved.  ROM: Flexion - Wand (Supine)   Lie on back holding wand. Raise arms over head.  Repeat __10__ times per set. Do __2__ sets per session. Do _2Pendulum Circular   Bend forward 90 at waist, leaning on table for support with right hand. ROCK BODY in a circular pattern to move arm clockwise __10__ times then counterclockwise _10___ times.  Hold a soup can or water bottle in left hand for a little weight if needed Do __2__ sessions per day.  Copyright  VHI. All rights reserved.  ___ sessions per day.  http://orth.exer.us/929   Copyright  VHI. All rights reserved.   Starr Lake PT, DPT, LAT, ATC  [facility] Phone: [phone]

## 2015-02-24 ENCOUNTER — Ambulatory Visit: Payer: Medicaid Other | Attending: Physician Assistant | Admitting: Physical Therapy

## 2015-02-24 DIAGNOSIS — R293 Abnormal posture: Secondary | ICD-10-CM | POA: Diagnosis not present

## 2015-02-24 DIAGNOSIS — M25512 Pain in left shoulder: Secondary | ICD-10-CM

## 2015-02-24 DIAGNOSIS — M25612 Stiffness of left shoulder, not elsewhere classified: Secondary | ICD-10-CM

## 2015-02-24 DIAGNOSIS — R29898 Other symptoms and signs involving the musculoskeletal system: Secondary | ICD-10-CM

## 2015-02-24 NOTE — Therapy (Signed)
[place], [place], [address] Phone: [phone]   Fax:  [phone]  Physical Therapy Treatment  Patient Details  Name: Amanda Christensen MRN: [id number] Date of Birth: 1961/02/04 Referring Provider:  Elizabeth Palau, *  Encounter Date: 02/24/2015      PT End of Session - 02/24/15 1733    Visit Number 2   Number of Visits 4   Date for PT Re-Evaluation 04/08/15   PT Start Time 0310   PT Stop Time 0400   PT Time Calculation (min) 50 min   Activity Tolerance Patient limited by pain;Patient limited by fatigue   Behavior During Therapy [facility] for tasks assessed/performed      Past Medical History  Diagnosis Date  . Hypertension   . Depression   . Arthritis   . COPD (chronic obstructive pulmonary disease)   . Heart murmur   . 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 Obesity   . Chronic pain   . Peripheral vascular disease 10    rt leg dvt   . Shortness of breath dyspnea   . Asthma   . Seizures 03    was on medications none now off  since 11    Past Surgical History  Procedure Laterality Date  . Cesarean section    . Total knee arthroplasty Right   . Rotator cuff repair Left   . Joint replacement    . Hernia repair      umbilical  . Miscarriage    . Dilation and curettage of uterus    . Total shoulder arthroplasty Left 01/08/2015    dr Veverly Fells  . Total shoulder arthroplasty Left 01/08/2015    Procedure: LEFT TOTAL SHOULDER ARTHROPLASTY;  Surgeon: Augustin Schooling, MD;  Location: [place];  Service: Orthopedics;  Laterality: Left;    There were no vitals filed for this visit.  Visit Diagnosis:  Left shoulder pain  Weakness of left arm  Abnormal posture  Decreased ROM of left shoulder      Subjective Assessment - 02/24/15 1518    Subjective pt reportst that she is in more pain today, she states she was taken off of the oxycodone.    Currently  in Pain? Yes   Pain Score 7    Pain Location Shoulder   Pain Orientation Left;Posterior;Lateral            [facility] PT Assessment - 02/24/15 0001    AROM   Left Shoulder Flexion 99 Degrees  AAROM with wand in supine   Left Shoulder ABduction 90 Degrees  AAROM in supine                   [facility] Adult PT Treatment/Exercise - 02/24/15 0001    Shoulder Exercises: Supine   Flexion AAROM;Strengthening;Left;10 reps  with wand    Shoulder Exercises: Seated   Extension AROM;Strengthening;Left;10 reps  isometric   Internal Rotation AROM;Strengthening;Left;10 reps  isometric   Flexion AROM;Strengthening;Left;10 reps  isometric   Abduction AROM;Strengthening;Left;10 reps  Isometric   Shoulder Exercises: ROM/Strengthening   UBE (Upper Arm Bike) Nu Step L1 x 4 min  holding on to handles for assited shoulder flex/ext.    Other ROM/Strengthening Exercises abduction with wand in sitting x 10   Modalities   Modalities Cryotherapy   Cryotherapy   Number Minutes Cryotherapy 15 Minutes   Cryotherapy Location Shoulder   Type of Cryotherapy Other (  comment)  vasopnumatic compression   Manual Therapy   Manual Therapy Joint mobilization   Joint Mobilization Glenohumeral inferior mobilization Grade 1-2 with grade 1 distraction/oscillations, Grade 1 oscillations during PROM in flexion/abduction  Grade 1 distraction mobs for pain relief during treatment                PT Education - 02/24/15 1733    Education provided Yes   Education Details continue with HEP   Person(s) Educated Patient   Methods Explanation   Comprehension Verbalized understanding          PT Short Term Goals - 02/11/15 1740    PT SHORT TERM GOAL #1   Title pt will be I with basic/advanced HEP (02/25/2015)   Baseline minimal home exercise program from home health physical therapy   Time 4   Period Weeks   Status New   PT SHORT TERM GOAL #2   Title pt will be able to verbalize and demonstrate  techniques to reduce L shoulder inflammation via the RICE method (02/25/2015)   Baseline pt has trouble controlling pain without the use of pain medication   Time 4   Period Weeks   Status New           PT Long Term Goals - 02/11/15 1742    PT LONG TERM GOAL #1   Title Pt will increase L shoulder flexion/abduction AROM to > 45 degrees to assist with donning/ doffing clothing (04/08/2015)   Baseline pt AROM for flexion was 10 degrees and abduction was 41 degrees   Time 8   Period Weeks   Status New   PT LONG TERM GOAL #2   Title pt will increase decrease L shoulder pain to <6/10 without pain medication to assist with exercise progression (04/08/2015)   Baseline pt took pain medication before therapy pain was at 4/10   Time 8   Period Weeks   Status New   PT LONG TERM GOAL #3   Title Pt will increase L grip strength to >14 lbs to assist with ADLs and functional progression (04/08/2015)   Baseline pts L grip strength was 10 lbs.    Time 8   Period Weeks   Status New   PT LONG TERM GOAL #4   Title pt will increase FOTO score by >8 points to help with functional capcity (04/08/2015)   Time 8   Period Weeks   Status New   PT LONG TERM GOAL #5   Title pt will be able to verbalize and demonstrate techniques to reduce risk or reinjury of the Left shoulder by proper lifting mechanics, pain control, HEP (04/08/2015)   Baseline pt demonsrates abnormal posture/ lifting mechanics, and limited pain control    Time 8   Period Weeks   Status New               Plan - 02/24/15 1734    Clinical Impression Statement Amanda Christensen was able to get 99 degress of AAROM flexion using wand in supine, and 90 degrees of Passive abduction. pt tolerated isometric strengthening with minimal complaint. she present to therapy today without her sling and continues to have limitation due to increased pain and tightness. Plan to encorporate pullies next visit to assist with AAROM.    PT Next Visit Plan PROM/AAROM  of L shoulder, modalities for pain, isometric strengthening   PT Home Exercise Plan same asn before   Consulted and Agree with Plan of Care Patient  Problem List Patient Active Problem List   Diagnosis Date Noted  . S/P shoulder replacement 01/08/2015  . Acute renal failure 08/02/2014  . Rhabdomyolysis 08/02/2014  . Hypotension 08/02/2014  . Lactic acidosis 08/02/2014  . Arthritis 08/02/2014  . Hyperlipidemia 08/02/2014  . Asthma 08/02/2014  . Chest pain 03/26/2014  . COPD (chronic obstructive pulmonary disease) 03/25/2014  . Hypertension 03/25/2014  . Chest tightness or pressure 03/25/2014  . Supratherapeutic INR 03/25/2014  . Osteoarthritis 03/25/2014  . History of DVT (deep vein thrombosis) 03/25/2014   Starr Lake PT, DPT, LAT, ATC  02/24/2015  5:38 PM   [facility] [address] [place], [place], [address] Phone: [phone]   Fax:  [phone]

## 2015-03-10 ENCOUNTER — Encounter: Payer: Medicaid Other | Admitting: Physical Therapy

## 2015-03-24 ENCOUNTER — Ambulatory Visit: Payer: Medicaid Other | Attending: Physician Assistant | Admitting: Physical Therapy

## 2015-03-24 DIAGNOSIS — R293 Abnormal posture: Secondary | ICD-10-CM

## 2015-03-24 DIAGNOSIS — R29898 Other symptoms and signs involving the musculoskeletal system: Secondary | ICD-10-CM | POA: Diagnosis not present

## 2015-03-24 DIAGNOSIS — M25512 Pain in left shoulder: Secondary | ICD-10-CM

## 2015-03-24 DIAGNOSIS — M25612 Stiffness of left shoulder, not elsewhere classified: Secondary | ICD-10-CM

## 2015-03-24 NOTE — Therapy (Signed)
[place], [place], [address] Phone: [phone]   Fax:  [phone]  Physical Therapy Treatment  Patient Details  Name: Amanda Christensen MRN: [id number] Date of Birth: 06-16-1961 Referring Provider:  Elizabeth Palau, *  Encounter Date: 03/24/2015      PT End of Session - 03/24/15 1153    Visit Number 3   Number of Visits 4   Date for PT Re-Evaluation 04/08/15   PT Start Time 9024   PT Stop Time 1112   PT Time Calculation (min) 57 min   Activity Tolerance Patient tolerated treatment well   Behavior During Therapy [facility] for tasks assessed/performed      Past Medical History  Diagnosis Date  . Hypertension   . Depression   . Arthritis   . COPD (chronic obstructive pulmonary disease)   . Heart murmur   . 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 Obesity   . Chronic pain   . Peripheral vascular disease 10    rt leg dvt   . Shortness of breath dyspnea   . Asthma   . Seizures 03    was on medications none now off  since 11    Past Surgical History  Procedure Laterality Date  . Cesarean section    . Total knee arthroplasty Right   . Rotator cuff repair Left   . Joint replacement    . Hernia repair      umbilical  . Miscarriage    . Dilation and curettage of uterus    . Total shoulder arthroplasty Left 01/08/2015    dr Veverly Fells  . Total shoulder arthroplasty Left 01/08/2015    Procedure: LEFT TOTAL SHOULDER ARTHROPLASTY;  Surgeon: Augustin Schooling, MD;  Location: [place];  Service: Orthopedics;  Laterality: Left;    There were no vitals filed for this visit.  Visit Diagnosis:  Left shoulder pain  Weakness of left arm  Abnormal posture  Decreased ROM of left shoulder      Subjective Assessment - 03/24/15 1020    Subjective pt reports that her shoulder has gotten a little better since the last visit but reports that she continues to notice  soreness.    Currently in Pain? Yes   Pain Score 6    Pain Location Shoulder   Pain Orientation Left;Posterior;Lateral   Pain Descriptors / Indicators Aching   Pain Type Surgical pain   Pain Onset More than a month ago   Pain Frequency Intermittent   Pain Score 9   Pain Location Back   Pain Orientation Lower;Mid   Pain Descriptors / Indicators Stabbing   Pain Type Chronic pain   Pain Onset More than a month ago   Pain Frequency Intermittent            [facility] PT Assessment - 03/24/15 1022    AROM   Left Shoulder Flexion 88 Degrees  in supine   Left Shoulder ABduction 84 Degrees  pain at endrange   PROM   Left Shoulder Flexion 98 Degrees  pain at endrange, 120 degrees   Left Shoulder ABduction 92 Degrees  pain and tightness at endrange, 130 degrees.                     [facility] Adult PT Treatment/Exercise - 03/24/15 0001    Shoulder Exercises: Seated   Row AROM;Strengthening;Both;10 reps;Theraband   Theraband Level (Shoulder  Row) Level 1 (Yellow)   External Rotation AROM;Strengthening;Left;5 reps;Theraband   Theraband Level (Shoulder External Rotation) Level 1 (Yellow)   Internal Rotation AROM;Strengthening;Left;5 reps;Theraband   Theraband Level (Shoulder Internal Rotation) Level 1 (Yellow)   Flexion AROM;Strengthening;Left;10 reps   Abduction AROM;Strengthening;Left;10 reps   Shoulder Exercises: Standing   Other Standing Exercises pendulums 2 x 1 min  1 x CW, 1 x CCW   Shoulder Exercises: Pulleys   Flexion 3 minutes   Flexion Limitations VC to maintain good seated posture  pain at endrange   Cryotherapy   Number Minutes Cryotherapy 15 Minutes   Cryotherapy Location Shoulder   Type of Cryotherapy Ice pack   Manual Therapy   Manual Therapy Joint mobilization   Joint Mobilization Glenohumeral inferior mobilization Grade 1-2 with grade 1 distraction/oscillations, Grade 1 oscillations during PROM in flexion/abduction  grade 2 in all planes.                  PT Education - 03/24/15 1153    Education provided Yes   Education Details shoulder IR and ER in nuetral   Person(s) Educated Patient   Methods Explanation   Comprehension Verbalized understanding          PT Short Term Goals - 03/24/15 1216    PT SHORT TERM GOAL #1   Title pt will be I with basic/advanced HEP (02/25/2015)   Baseline minimal home exercise program from home health physical therapy   Time 4   Period Weeks   Status Achieved   PT SHORT TERM GOAL #2   Title pt will be able to verbalize and demonstrate techniques to reduce L shoulder inflammation via the RICE method (02/25/2015)   Baseline pt has trouble controlling pain without the use of pain medication   Period Weeks   Status Achieved           PT Long Term Goals - 03/24/15 1217    PT LONG TERM GOAL #1   Title Pt will increase L shoulder flexion/abduction AROM to > 45 degrees to assist with donning/ doffing clothing (04/08/2015)   Baseline pt AROM for flexion was 10 degrees and abduction was 41 degrees   Time 8   Period Weeks   Status On-going   PT LONG TERM GOAL #2   Baseline pt took pain medication before therapy pain was at 4/10   Time 8   Period Weeks   Status On-going   PT LONG TERM GOAL #3   Title Pt will increase L grip strength to >14 lbs to assist with ADLs and functional progression (04/08/2015)   Baseline pts L grip strength was 10 lbs.    Time 8   Period Weeks   Status On-going   PT LONG TERM GOAL #4   Title pt will increase FOTO score by >8 points to help with functional capcity (04/08/2015)   Time 8   Period Weeks   Status On-going   PT LONG TERM GOAL #5   Title pt will be able to verbalize and demonstrate techniques to reduce risk or reinjury of the Left shoulder by proper lifting mechanics, pain control, HEP (04/08/2015)   Baseline pt demonsrates abnormal posture/ lifting mechanics, and limited pain control    Time 8   Period Weeks   Status New                Plan - 03/24/15 1154    Clinical Impression Statement [name] presents to therapy today stating that she is doing  a little better since the last visit. She met STG 1 and 2. She can get to 88 degrees of flexion and 84 degrees of abduction actively, and PROM of flexion of 120 degrees with pain at endrange and 130 degrees of abduction.  PT added shoulder IR and external rotation with yellow theraband. PT educated about purchasing a Publishing copy system at a medical supply company in [place].  spoke with pt abou getting he into the [facility] after her last visit to continue with therapy as tolerated.    PT Next Visit Plan PROM/AAROM of L shoulder, modalities for pain, isometric strengthening   PT Home Exercise Plan shoulder IR and ER with yellow theraband, pulley system for over door   Consulted and Agree with Plan of Care Patient        Problem List Patient Active Problem List   Diagnosis Date Noted  . S/P shoulder replacement 01/08/2015  . Acute renal failure 08/02/2014  . Rhabdomyolysis 08/02/2014  . Hypotension 08/02/2014  . Lactic acidosis 08/02/2014  . Arthritis 08/02/2014  . Hyperlipidemia 08/02/2014  . Asthma 08/02/2014  . Chest pain 03/26/2014  . COPD (chronic obstructive pulmonary disease) 03/25/2014  . Hypertension 03/25/2014  . Chest tightness or pressure 03/25/2014  . Supratherapeutic INR 03/25/2014  . Osteoarthritis 03/25/2014  . History of DVT (deep vein thrombosis) 03/25/2014   Starr Lake PT, DPT, LAT, ATC  03/24/2015  12:23 PM      [facility] [address] [place], [place], [address] Phone: [phone]   Fax:  [phone]

## 2015-03-24 NOTE — Patient Instructions (Addendum)
  Go to medical supply store and see if can purchase a overdoor pulley system for shoulder range of motion.    Starr Lake PT, DPT, LAT, ATC  [facility] Phone: [phone]

## 2015-03-31 ENCOUNTER — Ambulatory Visit: Payer: Medicaid Other | Admitting: Physical Therapy

## 2015-04-01 ENCOUNTER — Ambulatory Visit: Payer: Medicaid Other | Admitting: Physical Therapy

## 2015-04-01 DIAGNOSIS — R293 Abnormal posture: Secondary | ICD-10-CM

## 2015-04-01 DIAGNOSIS — R29898 Other symptoms and signs involving the musculoskeletal system: Secondary | ICD-10-CM

## 2015-04-01 DIAGNOSIS — M25612 Stiffness of left shoulder, not elsewhere classified: Secondary | ICD-10-CM

## 2015-04-01 DIAGNOSIS — M25512 Pain in left shoulder: Secondary | ICD-10-CM

## 2015-04-01 NOTE — Therapy (Addendum)
[place], [place], [address] Phone: [phone]   Fax:  [phone]  Physical Therapy Treatment  Patient Details  Name: Amanda Christensen MRN: [id number] Date of Birth: [birth year] Referring Provider:  Elizabeth Palau, *  Encounter Date: 04/01/2015      PT End of Session - 04/01/15 1827    Visit Number 4   Number of Visits 4   Date for PT Re-Evaluation 04/08/15   PT Start Time 2671   PT Stop Time 1615   PT Time Calculation (min) 60 min   Activity Tolerance Patient tolerated treatment well;Patient limited by pain      Past Medical History  Diagnosis Date  . Hypertension   . Depression   . Arthritis   . COPD (chronic obstructive pulmonary disease)   . Heart murmur   . 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 Obesity   . Chronic pain   . Peripheral vascular disease 10    rt leg dvt   . Shortness of breath dyspnea   . Asthma   . Seizures 03    was on medications none now off  since 11    Past Surgical History  Procedure Laterality Date  . Cesarean section    . Total knee arthroplasty Right   . Rotator cuff repair Left   . Joint replacement    . Hernia repair      umbilical  . Miscarriage    . Dilation and curettage of uterus    . Total shoulder arthroplasty Left 01/08/2015    dr Veverly Fells  . Total shoulder arthroplasty Left 01/08/2015    Procedure: LEFT TOTAL SHOULDER ARTHROPLASTY;  Surgeon: Augustin Schooling, MD;  Location: [place];  Service: Orthopedics;  Laterality: Left;    There were no vitals filed for this visit.  Visit Diagnosis:  Left shoulder pain  Weakness of left arm  Abnormal posture  Decreased ROM of left shoulder      Subjective Assessment - 04/01/15 1418    Subjective 5/10 pain.   Able to use arm a little.   Currently in Pain? Yes   Pain Score 5    Pain Location Shoulder   Pain Orientation Left;Posterior;Lateral;Anterior    Pain Descriptors / Indicators Aching;Burning   Pain Frequency Intermittent   Aggravating Factors  putting on clothes, sleeping on it. movement in general.   Pain Relieving Factors meds, ice, shower   Effect of Pain on Daily Activities limited use of arm, interrupts sleep                         [facility] Adult PT Treatment/Exercise - 04/01/15 1421    Shoulder Exercises: Seated   External Rotation --  added to home.  ER both tan band and yellow bands issued    Other Seated Exercises 4 way isometrics. 5 second holds 10 reps   Shoulder Exercises: Pulleys   Flexion 3 minutes   Electrical Stimulation   Electrical Stimulation Location Lt shoulder   Electrical Stimulation Action IFC   Electrical Stimulation Parameters 7   Electrical Stimulation Goals Pain   Manual Therapy   Manual Therapy --  levator scapular trigger point.  upper trap, peri shoulder s     Passive flexion to 80 degrees Lt.           PT Education - 04/01/15 1826  Education provided Yes   Education Details ER band both hands   Methods Explanation;Demonstration;Tactile cues;Verbal cues;Handout   Comprehension Verbalized understanding;Returned demonstration          PT Short Term Goals - 04/01/15 1830    PT SHORT TERM GOAL #1   Title pt will be I with basic/advanced HEP (02/25/2015)   Time 4   Period Weeks   Status Achieved   PT SHORT TERM GOAL #2   Title pt will be able to verbalize and demonstrate techniques to reduce L shoulder inflammation via the RICE method (02/25/2015)   Time 4   Status Achieved           PT Long Term Goals - 04/01/15 1830    PT LONG TERM GOAL #1   Title Pt will increase L shoulder flexion/abduction AROM to > 45 degrees to assist with donning/ doffing clothing (04/08/2015)   Baseline very limited   Time 8   Period Weeks   Status On-going   PT LONG TERM GOAL #2   Title pt will increase decrease L shoulder pain to <6/10 without pain medication to assist with  exercise progression (04/08/2015)   Baseline inconsistant   Time 8   Status On-going   PT LONG TERM GOAL #3   Title Pt will increase L grip strength to >14 lbs to assist with ADLs and functional progression (04/08/2015)   Time 8   Period Weeks   Status On-going   PT LONG TERM GOAL #4   Title pt will increase FOTO score by >8 points to help with functional capcity (04/08/2015)   Time 8   Status Unable to assess   PT LONG TERM GOAL #5   Title pt will be able to verbalize and demonstrate techniques to reduce risk or reinjury of the Left shoulder by proper lifting mechanics, pain control, HEP (04/08/2015)   Time 8   Period Weeks   Status On-going               Plan - 04/01/15 1828    Clinical Impression Statement I more visit approved (do not count first visit)  Progress toward home exercise goal,  Plans for [place] clinic in motion.   PT Next Visit Plan PROM/AAROM of L shoulder, modalities for pain, isometric strengthening.  Check to see if patient has contacted [place] clinic   Consulted and Agree with Plan of Care Patient        Problem List Patient Active Problem List   Diagnosis Date Noted  . S/P shoulder replacement 01/08/2015  . Acute renal failure 08/02/2014  . Rhabdomyolysis 08/02/2014  . Hypotension 08/02/2014  . Lactic acidosis 08/02/2014  . Arthritis 08/02/2014  . Hyperlipidemia 08/02/2014  . Asthma 08/02/2014  . Chest pain 03/26/2014  . COPD (chronic obstructive pulmonary disease) 03/25/2014  . Hypertension 03/25/2014  . Chest tightness or pressure 03/25/2014  . Supratherapeutic INR 03/25/2014  . Osteoarthritis 03/25/2014  . History of DVT (deep vein thrombosis) 03/25/2014    [facility] 04/01/2015, 6:34 PM  [facility] [address] [place], [place], [address] Phone: [phone]   Fax:  [phone]  Melvenia Needles, PTA 04/01/2015 6:34 PM Phone: [phone] Fax:  [phone]                 PHYSICAL THERAPY DISCHARGE SUMMARY  Visits from Start of Care: 4  Current functional level related to goals / functional outcomes: FOTO 75% limited   Remaining deficits: Shoulder pain, limited AROm, decreased strength  Education / Equipment: HEP  Plan: Patient agrees to discharge.  Patient goals were not met. Patient is being discharged due to financial reasons.  ?????         Kristoffer Leamon PT, DPT, LAT, ATC  07/29/2015  8:44 AM

## 2015-04-05 ENCOUNTER — Ambulatory Visit: Payer: Medicaid Other | Admitting: Physical Therapy

## 2015-05-12 ENCOUNTER — Other Ambulatory Visit: Payer: Self-pay

## 2015-05-12 DIAGNOSIS — Z1231 Encounter for screening mammogram for malignant neoplasm of breast: Secondary | ICD-10-CM

## 2015-06-01 ENCOUNTER — Ambulatory Visit: Payer: Medicaid Other

## 2015-09-28 ENCOUNTER — Encounter (HOSPITAL_COMMUNITY): Payer: Self-pay | Admitting: Emergency Medicine

## 2015-09-28 ENCOUNTER — Emergency Department (EMERGENCY_DEPARTMENT_HOSPITAL): Payer: Medicaid Other

## 2015-09-28 ENCOUNTER — Emergency Department (HOSPITAL_COMMUNITY)
Admission: EM | Admit: 2015-09-28 | Discharge: 2015-09-28 | Disposition: A | Discharge status: Home / Self Care | Payer: Medicaid Other | Attending: Emergency Medicine | Admitting: Emergency Medicine

## 2015-09-28 DIAGNOSIS — Z87891 Personal history of nicotine dependence: Secondary | ICD-10-CM | POA: Diagnosis not present

## 2015-09-28 DIAGNOSIS — M7989 Other specified soft tissue disorders: Secondary | ICD-10-CM | POA: Diagnosis not present

## 2015-09-28 DIAGNOSIS — Z7952 Long term (current) use of systemic steroids: Secondary | ICD-10-CM | POA: Insufficient documentation

## 2015-09-28 DIAGNOSIS — M1990 Unspecified osteoarthritis, unspecified site: Secondary | ICD-10-CM | POA: Diagnosis not present

## 2015-09-28 DIAGNOSIS — F329 Major depressive disorder, single episode, unspecified: Secondary | ICD-10-CM | POA: Diagnosis not present

## 2015-09-28 DIAGNOSIS — R51 Headache: Secondary | ICD-10-CM | POA: Insufficient documentation

## 2015-09-28 DIAGNOSIS — Z7951 Long term (current) use of inhaled steroids: Secondary | ICD-10-CM | POA: Insufficient documentation

## 2015-09-28 DIAGNOSIS — J449 Chronic obstructive pulmonary disease, unspecified: Secondary | ICD-10-CM | POA: Insufficient documentation

## 2015-09-28 DIAGNOSIS — I10 Essential (primary) hypertension: Secondary | ICD-10-CM | POA: Insufficient documentation

## 2015-09-28 DIAGNOSIS — Z880 Allergy status to penicillin: Secondary | ICD-10-CM | POA: Insufficient documentation

## 2015-09-28 DIAGNOSIS — R011 Cardiac murmur, unspecified: Secondary | ICD-10-CM | POA: Diagnosis not present

## 2015-09-28 DIAGNOSIS — Z79899 Other long term (current) drug therapy: Secondary | ICD-10-CM | POA: Insufficient documentation

## 2015-09-28 DIAGNOSIS — Z86718 Personal history of other venous thrombosis and embolism: Secondary | ICD-10-CM | POA: Diagnosis not present

## 2015-09-28 DIAGNOSIS — E669 Obesity, unspecified: Secondary | ICD-10-CM | POA: Diagnosis not present

## 2015-09-28 DIAGNOSIS — R2241 Localized swelling, mass and lump, right lower limb: Secondary | ICD-10-CM | POA: Insufficient documentation

## 2015-09-28 LAB — COMPREHENSIVE METABOLIC PANEL
ALT: 16 U/L (ref 14–54)
AST: 26 U/L (ref 15–41)
Albumin: 3.6 g/dL (ref 3.5–5.0)
Alkaline Phosphatase: 93 U/L (ref 38–126)
Anion gap: 9 (ref 5–15)
BILIRUBIN TOTAL: 0.7 mg/dL (ref 0.3–1.2)
BUN: 10 mg/dL (ref 6–20)
CO2: 30 mmol/L (ref 22–32)
Calcium: 9.6 mg/dL (ref 8.9–10.3)
Chloride: 100 mmol/L — ABNORMAL LOW (ref 101–111)
Creatinine, Ser: 1.16 mg/dL — ABNORMAL HIGH (ref 0.44–1.00)
GFR calc Af Amer: 60 mL/min — ABNORMAL LOW (ref >60)
GFR, EST NON AFRICAN AMERICAN: 52 mL/min — AB (ref >60)
Glucose, Bld: 108 mg/dL — ABNORMAL HIGH (ref 65–99)
Potassium: 4 mmol/L (ref 3.5–5.1)
Sodium: 139 mmol/L (ref 135–145)
TOTAL PROTEIN: 6.5 g/dL (ref 6.5–8.1)

## 2015-09-28 LAB — CBC WITH DIFFERENTIAL/PLATELET
Basophils Absolute: 0 10*3/uL (ref 0.0–0.1)
Basophils Relative: 0 %
Eosinophils Absolute: 0.1 10*3/uL (ref 0.0–0.7)
Eosinophils Relative: 1 %
HEMATOCRIT: 43.4 % (ref 36.0–46.0)
Hemoglobin: 13.8 g/dL (ref 12.0–15.0)
LYMPHS ABS: 2.5 10*3/uL (ref 0.7–4.0)
LYMPHS PCT: 38 %
MCH: 27.9 pg (ref 26.0–34.0)
MCHC: 31.8 g/dL (ref 30.0–36.0)
MCV: 87.7 fL (ref 78.0–100.0)
MONO ABS: 0.9 10*3/uL (ref 0.1–1.0)
MONOS PCT: 13 %
NEUTROS ABS: 3.1 10*3/uL (ref 1.7–7.7)
NEUTROS PCT: 48 %
Platelets: 182 10*3/uL (ref 150–400)
RBC: 4.95 MIL/uL (ref 3.87–5.11)
RDW: 13.8 % (ref 11.5–15.5)
WBC: 6.5 10*3/uL (ref 4.0–10.5)

## 2015-09-28 MED ORDER — ACETAMINOPHEN 500 MG PO TABS
1000.0000 mg | ORAL_TABLET | Freq: Once | ORAL | Status: AC
  Administered 2015-09-28: 1000 mg via ORAL
  Filled 2015-09-28: qty 2

## 2015-09-28 MED ORDER — IBUPROFEN 800 MG PO TABS: 800.0000 mg | ORAL_TABLET | Freq: Three times a day (TID) | ORAL | Status: DC

## 2015-09-28 MED ORDER — DICLOFENAC SODIUM 1 % TD GEL: 2.0000 g | Freq: Four times a day (QID) | TRANSDERMAL | Status: DC

## 2015-09-28 NOTE — Discharge Instructions (Signed)
You have been seen today for leg swelling. Your ultrasound showed no signs of a blood clot in your leg. It is recommended that you follow up with orthopedics as soon as possible. Follow up with PCP as needed. Return to ED should symptoms worsen.

## 2015-09-28 NOTE — ED Notes (Signed)
Patient states R knee surgery in 2011 and for clots.   Patient states her leg has swollen larger than when she had clots previously.  Patient states swelling and pain began yesterday in the knee and surrounding area of leg.

## 2015-09-28 NOTE — Progress Notes (Signed)
VASCULAR LAB PRELIMINARY  PRELIMINARY  PRELIMINARY  PRELIMINARY  Right lower extremity venous duplex completed.    Preliminary report:  Right:  No evidence of DVT, superficial thrombosis, or Baker's cyst.  Cade Olberding, RVT 09/28/2015, 3:48 PM

## 2015-09-28 NOTE — ED Notes (Signed)
Patient Sats 100% states she needs o2. Patient does not wear o2 at home. Patient placed on 2L for comfort.

## 2015-09-28 NOTE — ED Provider Notes (Signed)
CSN: [id number]     Arrival date & time 09/28/15  1154 History   First MD Initiated Contact with Patient 09/28/15 1407     Chief Complaint  Patient presents with  . Leg Swelling     (Consider location/radiation/quality/duration/timing/severity/associated sxs/prior Treatment) HPI   Amanda Christensen is a 54 y.o. female, with a history of COPD, DVT, and PVD, presenting to the ED with right leg swelling and pain since yesterday. Pt was seen by Dr. Veverly Fells last week and was evaluated for some leg pain, but pt states the swelling is new. Swelling and pain is localized to the right knee. Rates pain at 7/10, achy, non-radiating. Pt is worse when pt ambulates. Pt states she has tried percocet for the pain, but this has not helped her. Pt adds that she has also had a migraine for since yesterday, but that it is consistent with her previous migraines. Pt denies shortness of breath, chest pain, N/V, rashes, or any other pain or complaints.     Past Medical History  Diagnosis Date  . Hypertension   . Depression   . Arthritis   . COPD (chronic obstructive pulmonary disease) ([place])   . Heart murmur   . DVT (deep venous thrombosis) ([facility])     h/o 4 blood clots after knee replacement RLE  . Obesity   . Chronic pain   . Peripheral vascular disease ([facility]) 10    rt leg dvt   . Shortness of breath dyspnea   . Asthma   . Seizures ([employer]) 03    was on medications none now off  since 11   Past Surgical History  Procedure Laterality Date  . Cesarean section    . Total knee arthroplasty Right   . Rotator cuff repair Left   . Joint replacement    . Hernia repair      umbilical  . Miscarriage    . Dilation and curettage of uterus    . Total shoulder arthroplasty Left 01/08/2015    dr Veverly Fells  . Total shoulder arthroplasty Left 01/08/2015    Procedure: LEFT TOTAL SHOULDER ARTHROPLASTY;  Surgeon: Augustin Schooling, MD;  Location: [facility];  Service: Orthopedics;  Laterality: Left;   Family History  Problem  Relation Age of Onset  . Hypertension      family history  . Cancer      family history  . Drug abuse      family history  . Other      family history of psychiatric problems, disabilities  . Cancer Father    Social History  Substance Use Topics  . Smoking status: Former Smoker -- 0.50 packs/day for 17 years    Types: Cigarettes    Quit date: 11/20/2012  . Smokeless tobacco: Former Systems developer  . Alcohol Use: No   OB History    No data available     Review of Systems  Respiratory: Negative for chest tightness and shortness of breath.   Cardiovascular: Positive for leg swelling. Negative for chest pain.  Neurological: Positive for headaches. Negative for dizziness and light-headedness.  All other systems reviewed and are negative.     Allergies  Penicillins; Tomato; and Hydrocodone  Home Medications   Prior to Admission medications   Medication Sig Start Date End Date Taking? Authorizing Provider  albuterol (PROVENTIL HFA;VENTOLIN HFA) 108 (90 BASE) MCG/ACT inhaler Inhale 1-2 puffs into the lungs every 6 (six) hours as needed for wheezing or shortness of breath. 12/15/14  Yes Benjamine Mola  Ralene Bathe, MD  ALPRAZolam Duanne Moron) 0.25 MG tablet Take 0.25 mg by mouth 3 (three) times daily. 09/15/15  Yes Historical Provider, MD  Ascorbic Acid (VITAMIN C PO) Take 1 tablet by mouth daily.   Yes Historical Provider, MD  cetirizine (ZYRTEC) 10 MG tablet Take 10 mg by mouth at bedtime.    Yes Historical Provider, MD  Cholecalciferol (VITAMIN D PO) Take 1 tablet by mouth daily.   Yes Historical Provider, MD  diclofenac sodium (VOLTAREN) 1 % GEL Apply 2 g topically daily as needed (muscle pain).    Yes Historical Provider, MD  DULoxetine (CYMBALTA) 30 MG capsule Take 30 mg by mouth daily. 09/15/15  Yes Historical Provider, MD  fluticasone (FLONASE) 50 MCG/ACT nasal spray Place 1 spray into both nostrils daily as needed for allergies or rhinitis.   Yes Historical Provider, MD  HYDROcodone-acetaminophen  (NORCO/VICODIN) 5-325 MG per tablet Take 1 tablet by mouth 2 (two) times daily as needed for moderate pain.  12/21/14  Yes Historical Provider, MD  lisinopril (PRINIVIL,ZESTRIL) 20 MG tablet Take 20 mg by mouth daily.   Yes Historical Provider, MD  montelukast (SINGULAIR) 10 MG tablet Take 10 mg by mouth at bedtime.   Yes Historical Provider, MD  traZODone (DESYREL) 100 MG tablet Take 100 mg by mouth at bedtime.   Yes Historical Provider, MD  triamcinolone cream (KENALOG) 0.1 % Apply 1 application topically 2 (two) times daily.   Yes Historical Provider, MD  verapamil (VERELAN PM) 240 MG 24 hr capsule Take 240 mg by mouth at bedtime.    Yes Historical Provider, MD  cyclobenzaprine (FLEXERIL) 5 MG tablet Take 1 tablet (5 mg total) by mouth 3 (three) times daily as needed for muscle spasms. Patient not taking: Reported on 09/28/2015 12/15/14   Quintella Reichert, MD  diclofenac sodium (VOLTAREN) 1 % GEL Apply 2 g topically 4 (four) times daily. 09/28/15   Edla Para C Aadan Chenier, PA-C  ibuprofen (ADVIL,MOTRIN) 800 MG tablet Take 1 tablet (800 mg total) by mouth 3 (three) times daily. 09/28/15   Aleeyah Bensen C Leily Capek, PA-C  methocarbamol (ROBAXIN) 500 MG tablet Take 1 tablet (500 mg total) by mouth 3 (three) times daily as needed. Patient not taking: Reported on 03/24/2015 01/08/15   Netta Cedars, MD  oxyCODONE-acetaminophen (ROXICET) 5-325 MG per tablet Take 1-2 tablets by mouth every 4 (four) hours as needed for severe pain. Patient not taking: Reported on 03/24/2015 01/08/15   Netta Cedars, MD   BP 118/59 mmHg  Pulse 51  Temp(Src) 98.3 F (36.8 C) (Oral)  Resp 18  Ht 5' (1.524 m)  Wt 201 lb (91.173 kg)  BMI 39.26 kg/m2  SpO2 100% Physical Exam  Constitutional: She appears well-developed and well-nourished. No distress.  HENT:  Head: Normocephalic and atraumatic.  Eyes: Conjunctivae and EOM are normal. Pupils are equal, round, and reactive to light.  Cardiovascular: Normal rate, regular rhythm and normal heart sounds.    Pulmonary/Chest: Effort normal and breath sounds normal. No respiratory distress.  Abdominal: Soft. Bowel sounds are normal.  Musculoskeletal: She exhibits no tenderness.  Very little ability to flex at the right knee. Full ROM in all other extremities. Swelling noted to right knee and extending into upper leg and down into her lower leg. No fluid shift consistent with an effusion appreciated. No pitting edema. Increased warmth in the area of swelling on right leg. Tenderness present as well.  Neurological: She is alert. She has normal reflexes.  Skin: Skin is warm and dry. She is  not diaphoretic.  Nursing note and vitals reviewed.   ED Course  Procedures (including critical care time) Labs Review Labs Reviewed  COMPREHENSIVE METABOLIC PANEL - Abnormal; Notable for the following:    Chloride 100 (*)    Glucose, Bld 108 (*)    Creatinine, Ser 1.16 (*)    GFR calc non Af Amer 52 (*)    GFR calc Af Amer 60 (*)    All other components within normal limits  CBC WITH DIFFERENTIAL/PLATELET    Imaging Review No results found. I have personally reviewed and evaluated these images and lab results as part of my medical decision-making.   EKG Interpretation None      MDM   Final diagnoses:  Right leg swelling  History of DVT of lower extremity    Donivan Scull presents with right leg swelling for two days.   Findings and plan of care discussed with Zachery Dakins, MD.  [facility] DVT criteria puts this patient in high risk category. Suspect DVT versus patellar joint effusion.  Since patient denies injury suspect DVT, over joint effusion, to be causing patient's leg swelling. Patient has no symptoms or signs of PE. Plan to rule out DVT. If duplex ultrasound is negative, will discharge patient and refer to outpatient orthopedics. 3:44 PM patient reassessed and states that her pain is now manageable. The plan of care was explained to the patient, patient agreed to this plan and is  comfortable with discharge. Ultrasound has been resulted and no sign of DVT was found. Patient be discharged.   Lorayne Bender, PA-C 09/28/15 1636  Leonard Schwartz, MD 09/29/15 [phone]

## 2016-07-07 ENCOUNTER — Other Ambulatory Visit: Payer: Self-pay | Admitting: Family Medicine

## 2016-07-07 DIAGNOSIS — Z1231 Encounter for screening mammogram for malignant neoplasm of breast: Secondary | ICD-10-CM

## 2016-07-12 ENCOUNTER — Ambulatory Visit
Admission: RE | Admit: 2016-07-12 | Discharge: 2016-07-12 | Disposition: A | Discharge status: Home / Self Care | Payer: Medicaid Other | Source: Ambulatory Visit | Attending: Family Medicine | Admitting: Family Medicine

## 2016-07-12 DIAGNOSIS — Z1231 Encounter for screening mammogram for malignant neoplasm of breast: Secondary | ICD-10-CM

## 2016-07-20 ENCOUNTER — Other Ambulatory Visit: Payer: Self-pay | Admitting: Specialist

## 2016-07-20 DIAGNOSIS — R928 Other abnormal and inconclusive findings on diagnostic imaging of breast: Secondary | ICD-10-CM

## 2016-07-25 ENCOUNTER — Ambulatory Visit
Admission: RE | Admit: 2016-07-25 | Discharge: 2016-07-25 | Disposition: A | Discharge status: Home / Self Care | Payer: Medicaid Other | Source: Ambulatory Visit | Attending: Specialist | Admitting: Specialist

## 2016-07-25 ENCOUNTER — Other Ambulatory Visit: Payer: Self-pay | Admitting: Specialist

## 2016-07-25 DIAGNOSIS — R928 Other abnormal and inconclusive findings on diagnostic imaging of breast: Secondary | ICD-10-CM

## 2016-07-25 DIAGNOSIS — N6320 Unspecified lump in the left breast, unspecified quadrant: Secondary | ICD-10-CM

## 2016-07-26 ENCOUNTER — Other Ambulatory Visit: Payer: Self-pay | Admitting: Specialist

## 2016-07-26 DIAGNOSIS — N6320 Unspecified lump in the left breast, unspecified quadrant: Secondary | ICD-10-CM

## 2016-07-28 ENCOUNTER — Ambulatory Visit
Admission: RE | Admit: 2016-07-28 | Discharge: 2016-07-28 | Disposition: A | Discharge status: Home / Self Care | Payer: Medicaid Other | Source: Ambulatory Visit | Attending: Specialist | Admitting: Specialist

## 2016-07-28 DIAGNOSIS — N6320 Unspecified lump in the left breast, unspecified quadrant: Secondary | ICD-10-CM

## 2016-08-03 ENCOUNTER — Encounter: Payer: Self-pay | Admitting: Surgery

## 2016-08-03 ENCOUNTER — Ambulatory Visit: Payer: Self-pay | Admitting: Surgery

## 2016-08-03 DIAGNOSIS — D369 Benign neoplasm, unspecified site: Secondary | ICD-10-CM

## 2016-08-03 NOTE — H&P (Signed)
Amanda Christensen 08/03/2016 1:14 PM Location: [facility] Patient #: [id number] DOB: May 21, 1961 Separated / Language: Amanda Christensen / Race: Black or African American Female  History of Present Illness (Amanda Christensen A. 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 She denies any symptoms whatsoever, denies noticing any mass, denies nipple discharge or skin changes, denies noticing any swelling in her axilla bilaterally. She is a former smoker but quit 5 years ago. No family history of breast cancer and she has never had a prior biopsy. Began menstruating at age 35 and went through menopause at age 48. First live birth at age 39. Denies any use of hormone replacement therapy. Surgical history notable for multiple orthopedic surgeries and umbilical hernia repair as well as C-section and a D&C. Medical history is positive for seizures, DVT which was treated with Coumadin but for which she now takes an aspirin daily, obesity hypertension and COPD.   ADDENDUM REPORT: 07/31/2016 14:17 ADDENDUM: Pathology revealed INTRADUCTAL PAPILLOMA of the Left breast at the 3:30 o'clock location, with excision recommended. This was found to be concordant by Dr. Everlean Christensen. Pathology results were discussed with the patient by telephone. The patient reported doing well after the biopsy with tenderness at the site. Post biopsy instructions and care were reviewed and questions were answered. The patient was encouraged to call [facility] for any additional concerns. Surgical consultation has been arranged with Dr. Romana Christensen at [facility] on August 03, 2016. Pathology results reported by Amanda Purser, RN on 07/31/2016. Electronically Signed By: Amanda Christensen M.D. On: 07/31/2016 14:17 Addended by Amanda Alstrom, MD on 08/01/2016 8:43 AM  Study  Result CLINICAL DATA: 55 year old female with an intraductal mass in the left breast at the 3:30 position. EXAM: ULTRASOUND GUIDED LEFT BREAST CORE NEEDLE BIOPSY COMPARISON: Previous exam(s). FINDINGS: I met with the patient and we discussed the procedure of ultrasound-guided biopsy, including benefits and alternatives. We discussed the high likelihood of a successful procedure. We discussed the risks of the procedure, including infection, bleeding, tissue injury, clip migration, and inadequate sampling. Informed written consent was given. The usual time-out protocol was performed immediately prior to the procedure. Using sterile technique and 1% Lidocaine as local anesthetic, under direct ultrasound visualization, a 12 gauge spring-loaded device was used to perform biopsy of the mass in the left breast at the 3:30 position using a lateral to medial approach. At the conclusion of the procedure a ribbon shaped tissue marker clip was deployed into the biopsy cavity. Follow up 2 view mammogram was performed and dictated separately. IMPRESSION: Ultrasound guided biopsy of the intraductal mass in the left breast at the 3:30 position. No apparent complications. Electronically Signed: By: Amanda Christensen M.D. On: 07/28/2016 13:32   FINDINGS: Mammographic images were obtained following ultrasound guided biopsy of an intraductal mass in the left breast at the approximate 3:30 position. A ribbon shaped biopsy marking clip is present at the targeted site of the biopsied mass. Note that the mass is obscured by a moderate sized hematoma.  IMPRESSION: Ribbon shaped biopsy marking clip at targeted site of biopsied mass in the left breast at the 3:30 position. The biopsied mass is obscured by a moderate sized hematoma.  Final Assessment: Post Procedure Mammograms for Marker Placement  CLINICAL DATA: Patient returns after outside screening study for evaluation of possible bilateral  masses.  EXAM: 2D DIGITAL DIAGNOSTIC BILATERAL MAMMOGRAM WITH  CAD AND ADJUNCT TOMO  ULTRASOUND BILATERAL BREAST  COMPARISON: Multiple prior studies including 05/15/2016 and earlier  ACR Breast Density Category b: There are scattered areas of fibroglandular density.  FINDINGS: Tomosynthesis images are performed, confirming presence of a circumscribed nodule in the upper-outer quadrant of the right breast. And oval circumscribed nodule is confirmed in the lateral portion of the left breast.  Mammographic images were processed with CAD.  Targeted ultrasound is performed, showing a small group of cysts in the 10 o'clock location of the right breast 3 cm from nipple. These measure 0.4 x 0.3 x 0.4 cm.  In the 330 o'clock location of the left breast 2 cm from nipple there is a small solid mass within the knee mildly dilated duct measuring 0.7 x 0.3 by 0.4 cm. There is small focus of vascularity within this mass identified above. Evaluation of the axilla is negative.  IMPRESSION: 1. Small area of fibrocystic change in the 10 o'clock location of the right breast consistent with benign process. 2. Small intraductal mass in the 330 o'clock location of the left breast warranting tissue diagnosis.  RECOMMENDATION: Ultrasound-guided core biopsy of left breast lesion is recommended. This will be scheduled at the patient's convenience.  I have discussed the findings and recommendations with the patient. Results were also provided in writing at the conclusion of the visit. If applicable, a reminder letter will be sent to the patient regarding the next appointment.  BI-RADS CATEGORY 4: Suspicious.  The patient is a 55 year old female.   Allergies Amanda Christensen, CMA; 08/03/2016 1:21 PM) Penicillins OxyCODONE HCl *ANALGESICS - OPIOID*  Medication History Amanda Christensen, CMA; 08/03/2016 1:25 PM) TraMADol HCl (50MG Tablet, Oral) Active. Meloxicam (15MG Tablet, Oral)  Active. Lidocaine (5% Patch, External) Active. Cyclobenzaprine HCl (10MG Tablet, Oral) Active. Albuterol Sulfate (0.63MG/3ML Nebulized Soln, Inhalation) Active. Montelukast Sodium (10MG Tablet, Oral) Active. ProAir HFA (108 (90 Base)MCG/ACT Aerosol Soln, Inhalation) Active. Medications Reconciled     Review of Systems (Romesha Scherer A. Amanda Heller MD; 08/03/2016 1:39 PM) All other systems negative  Vitals (Eddiena Albanese CMA; 08/03/2016 1:19 PM) 08/03/2016 1:18 PM Weight: 198 lb Height: 60in Body Surface Area: 1.86 m Body Mass Index: 38.67 kg/m  Temp.: 98.60F(Oral)  Pulse: 68 (Regular)  Resp.: 18 (Unlabored)  BP: 138/72 (Sitting, Left Arm, Standard)      Physical Exam (Saoirse Legere A. Amanda Heller MD; 08/03/2016 1:47 PM)  The physical exam findings are as follows: Note:She is alert and oriented in no acute distress Extra motion intact pupils equally round and reactive. Anicteric. Moist mucous membranes Neck no masses or thyromegaly Unlabored respirations, clear to auscultation bilaterally Regular rate and rhythm, palpable distal pulses Abdomen is soft nontender nondistended, no organomegaly, obese Extremities are warm and well perfused, no deformity or edema. 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 The left breast is tender laterally where there is a organizing hematoma under her needle biopsy site, accompanied by large ecchymosis. Otherwise no nipple discharge no other skin abnormalities and no other palpable mass.    Assessment & Plan (Marua Qin A. Amanda Heller MD; 08/03/2016 1:41 PM)  INTRADUCTAL PAPILLOMA OF BREAST, LEFT (Principal Diagnosis) (D24.2) Story: Left breast intraductal  papilloma. We'll plan for needle localized lumpectomy. I discussed the natural history of intraductal  papilloma and that while at a benign lesion, excision is recommended to exclude any further abnormality. We discussed the risk of bleeding, infection, pain, scarring, and we discussed the process of a wire localized lumpectomy. She is agreeable to proceed.

## 2016-09-07 ENCOUNTER — Encounter (HOSPITAL_COMMUNITY)
Admission: RE | Admit: 2016-09-07 | Discharge: 2016-09-07 | Disposition: A | Discharge status: Home / Self Care | Payer: Medicaid Other | Source: Ambulatory Visit | Attending: Surgery | Admitting: Surgery

## 2016-09-07 ENCOUNTER — Encounter (HOSPITAL_COMMUNITY): Payer: Self-pay

## 2016-09-07 DIAGNOSIS — Z01812 Encounter for preprocedural laboratory examination: Secondary | ICD-10-CM | POA: Insufficient documentation

## 2016-09-07 DIAGNOSIS — I10 Essential (primary) hypertension: Secondary | ICD-10-CM | POA: Insufficient documentation

## 2016-09-07 DIAGNOSIS — Z96651 Presence of right artificial knee joint: Secondary | ICD-10-CM | POA: Diagnosis not present

## 2016-09-07 DIAGNOSIS — Z79899 Other long term (current) drug therapy: Secondary | ICD-10-CM | POA: Insufficient documentation

## 2016-09-07 DIAGNOSIS — J449 Chronic obstructive pulmonary disease, unspecified: Secondary | ICD-10-CM | POA: Diagnosis not present

## 2016-09-07 DIAGNOSIS — D242 Benign neoplasm of left breast: Secondary | ICD-10-CM | POA: Insufficient documentation

## 2016-09-07 DIAGNOSIS — Z87891 Personal history of nicotine dependence: Secondary | ICD-10-CM | POA: Diagnosis not present

## 2016-09-07 DIAGNOSIS — Z96612 Presence of left artificial shoulder joint: Secondary | ICD-10-CM | POA: Diagnosis not present

## 2016-09-07 DIAGNOSIS — Z86718 Personal history of other venous thrombosis and embolism: Secondary | ICD-10-CM | POA: Diagnosis not present

## 2016-09-07 DIAGNOSIS — E669 Obesity, unspecified: Secondary | ICD-10-CM | POA: Diagnosis not present

## 2016-09-07 DIAGNOSIS — Z6839 Body mass index (BMI) 39.0-39.9, adult: Secondary | ICD-10-CM | POA: Insufficient documentation

## 2016-09-07 DIAGNOSIS — Z01818 Encounter for other preprocedural examination: Secondary | ICD-10-CM | POA: Insufficient documentation

## 2016-09-07 DIAGNOSIS — Z7951 Long term (current) use of inhaled steroids: Secondary | ICD-10-CM | POA: Diagnosis not present

## 2016-09-07 HISTORY — DX: Personal history of other mental and behavioral disorders: Z86.59

## 2016-09-07 HISTORY — DX: Personal history of other diseases of the respiratory system: Z87.09

## 2016-09-07 LAB — BASIC METABOLIC PANEL
Anion gap: 7 (ref 5–15)
BUN: 16 mg/dL (ref 6–20)
CALCIUM: 9.6 mg/dL (ref 8.9–10.3)
CO2: 29 mmol/L (ref 22–32)
Chloride: 105 mmol/L (ref 101–111)
Creatinine, Ser: 0.84 mg/dL (ref 0.44–1.00)
GFR calc Af Amer: >60 mL/min (ref >60)
GLUCOSE: 80 mg/dL (ref 65–99)
POTASSIUM: 3.5 mmol/L (ref 3.5–5.1)
Sodium: 141 mmol/L (ref 135–145)

## 2016-09-07 LAB — CBC
HEMATOCRIT: 43.3 % (ref 36.0–46.0)
Hemoglobin: 13.9 g/dL (ref 12.0–15.0)
MCH: 28.1 pg (ref 26.0–34.0)
MCHC: 32.1 g/dL (ref 30.0–36.0)
MCV: 87.7 fL (ref 78.0–100.0)
Platelets: 192 10*3/uL (ref 150–400)
RBC: 4.94 MIL/uL (ref 3.87–5.11)
RDW: 12.8 % (ref 11.5–15.5)
WBC: 3.5 10*3/uL — ABNORMAL LOW (ref 4.0–10.5)

## 2016-09-07 NOTE — Pre-Procedure Instructions (Addendum)
IVOR PANDA  09/07/2016      [facility] [id number] [place] [address] Phone: [phone] Fax: [phone]  [facility], [place] [employer] [address] Phone: [phone] Fax: [place] [id number] [place] [id number] [place] [address] Phone: [phone] Fax: [phone]    Your procedure is scheduled on Wednesday, October 25.   Report to [facility] Admitting at 9:00 AM                 Your surgery or procedure is scheduled for 11:00 AM   Call this number if you have problems the morning of surgery:[phone]                  For any other questions, please call [phone], Monday - Friday 8 AM - 4 PM.   Remember:  Do not eat food or drink liquids after midnight Tuesday, October 24.   Take these medicines the morning of surgery with A SIP OF WATER: May use Eye Drops.                Take if needed: acetaminophen (TYLENOL), traMADol (ULTRAM), methocarbamol (ROBAXIN).                May use Albuterol inhaler and please bring it to the hospital with you.                1 Week prior to surgery STOP taking Aspirin , Aspirin Products (Goody Powder, Excedrin Migraine), Ibuprofen (Advil), Naproxen (Aleve), meloxicam (MOBIC),diclofenac sodium (VOLTAREN),  Vitams and Herbal Products (ie Fish Oil).                       [facility] Preparing For Surgery  Before surgery, you can play an important role. Because skin is not sterile, your skin needs to be as free of germs as possible. You can reduce the number of germs on your skin by washing with CHG (chlorahexidine gluconate) Soap before surgery.  CHG is an antiseptic cleaner which kills germs and bonds with the skin to continue killing germs even after washing.  Please do not use if you have an  allergy to CHG or antibacterial soaps. If your skin becomes reddened/irritated stop using the CHG.  Do not shave (including legs and underarms) for at least 48 hours prior to first CHG shower. It is OK to shave your face.  Please follow these instructions carefully.   1. Shower the NIGHT BEFORE SURGERY and the MORNING OF SURGERY with CHG.   2. If you chose to wash your hair, wash your hair first as usual with your normal shampoo.  3. After you shampoo, rinse your hair and body thoroughly to remove the shampoo.  4. Use CHG as you would any other liquid soap. You can apply CHG directly to the skin and wash gently with a scrungie or a clean washcloth.   5. Apply the CHG Soap to your body ONLY FROM THE NECK DOWN.  Do not use on open wounds or open sores. Avoid contact with your eyes, ears, mouth and genitals (private parts). Wash genitals (private parts) with your normal soap.  6. Wash thoroughly, paying special attention to the area where your surgery will be performed.  7. Thoroughly rinse your body with warm water from the neck down.  8. DO NOT shower/wash with your normal soap after using and rinsing off the CHG Soap.  9. Pat yourself dry with a CLEAN TOWEL.   10. Wear CLEAN PAJAMAS   11. Place CLEAN SHEETS on your bed the night of your first shower and DO NOT SLEEP WITH PETS.  Day of Surgery: Do not apply any deodorants/lotions. Please wear clean clothes to the hospital/surgery center.     Do not wear jewelry, make-up or nail polish.  Do not wear lotions, powders, or perfumes, or deodorant.  Do not shave 48 hours prior to surgery.    Do not bring valuables to the hospital.  [facility] is not responsible for any belongings or valuables.  Contacts, dentures or bridgework may not be worn into surgery.  Leave your suitcase in the car.  After surgery it may be brought to your room.  For patients admitted to the hospital, discharge time will be determined by your treatment  team.  Patients discharged the day of surgery will not be allowed to drive home.

## 2016-09-07 NOTE — Progress Notes (Signed)
PCP - Dr. Elizabeth Palau Cardiologist - denies  EKG - 09/07/16 CXR - denies Echo-  2008 Stress test - 2015 Cardiac Cath - denies  Patient denies chest pain and shortness of breath at PAT appointment.

## 2016-09-07 NOTE — Progress Notes (Signed)
Notified Abigail Butts at [facility] surgery of patient's allergy to penicillin and order for Ancef.  Abigail Butts stated that she would inform Dr. Kae Heller.

## 2016-09-08 NOTE — Progress Notes (Signed)
Anesthesia Chart Review: Patient is 55 year old female scheduled for left breast needle localized lumpectomy on 09/13/16 by Dr. Romana Juniper. DX: left breast intraductal papilloma.  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   PCP is listed as Dr. Lindwood Qua.  Meds include albuterol, ASA 81 mg (on hold), Zyrtec, Flonase, Advair, lisinopril, Robaxin, Singulair, tramadol, trazodone, verapamil.  BP 132/82   Pulse 79   Temp 36.8 C   Resp 20   Ht 5' (1.524 m)   Wt 200 lb 9.6 oz (91 kg)   SpO2 100%   BMI 39.18 kg/m   EKG 09/07/2016: NSR. Nonspecific T wave abnormality.   Nuclear stress test 03/26/2014: Reduced activity along the inferior wall on stress and rest images, diaphragmatic attenuation is favored over RCA distribution scar. Left ventricular rest ejection fraction is calculated at 74%  Echo 03/05/2007: - Overall left ventricular systolic function was normal. Left ventricular ejection fraction was estimated , range being 55% to 65%. There was no diagnostic evidence of left ventricular regional wall motion abnormalities. Left ventricular diastolic function parameters were normal. - There was trivial aortic valvular regurgitation. - There was mild mitral valvular regurgitation. - A tendinous band is noted in the mid-right ventricle. The estimated peak right ventricular systolic pressure was near the upper limit of normal.  Preoperative labs reviewed.    If no acute changes then I anticipate that she can proceed as planned.  George Hugh [facility]/Anesthesiology Phone [phone] 09/08/2016 11:27 AM

## 2016-09-12 HISTORY — PX: BREAST EXCISIONAL BIOPSY: SUR124

## 2016-09-13 ENCOUNTER — Ambulatory Visit (HOSPITAL_COMMUNITY): Payer: Medicaid Other | Admitting: Vascular Surgery

## 2016-09-13 ENCOUNTER — Ambulatory Visit
Admission: RE | Admit: 2016-09-13 | Discharge: 2016-09-13 | Disposition: A | Discharge status: Home / Self Care | Payer: Medicaid Other | Source: Ambulatory Visit | Attending: Surgery | Admitting: Surgery

## 2016-09-13 ENCOUNTER — Other Ambulatory Visit: Payer: Self-pay | Admitting: Surgery

## 2016-09-13 ENCOUNTER — Encounter (HOSPITAL_COMMUNITY): Payer: Self-pay | Admitting: *Deleted

## 2016-09-13 ENCOUNTER — Encounter (HOSPITAL_COMMUNITY)
Admission: RE | Disposition: A | Discharge status: Home / Self Care | Payer: Self-pay | Source: Ambulatory Visit | Attending: Surgery

## 2016-09-13 ENCOUNTER — Ambulatory Visit (HOSPITAL_COMMUNITY)
Admission: RE | Admit: 2016-09-13 | Discharge: 2016-09-13 | Disposition: A | Discharge status: Home / Self Care | Payer: Medicaid Other | Source: Ambulatory Visit | Attending: Surgery | Admitting: Surgery

## 2016-09-13 ENCOUNTER — Ambulatory Visit (HOSPITAL_COMMUNITY): Payer: Medicaid Other | Admitting: Anesthesiology

## 2016-09-13 DIAGNOSIS — Z96612 Presence of left artificial shoulder joint: Secondary | ICD-10-CM | POA: Diagnosis not present

## 2016-09-13 DIAGNOSIS — E669 Obesity, unspecified: Secondary | ICD-10-CM | POA: Insufficient documentation

## 2016-09-13 DIAGNOSIS — Z87891 Personal history of nicotine dependence: Secondary | ICD-10-CM | POA: Insufficient documentation

## 2016-09-13 DIAGNOSIS — Z86718 Personal history of other venous thrombosis and embolism: Secondary | ICD-10-CM | POA: Diagnosis not present

## 2016-09-13 DIAGNOSIS — J449 Chronic obstructive pulmonary disease, unspecified: Secondary | ICD-10-CM | POA: Diagnosis not present

## 2016-09-13 DIAGNOSIS — I10 Essential (primary) hypertension: Secondary | ICD-10-CM | POA: Diagnosis not present

## 2016-09-13 DIAGNOSIS — D242 Benign neoplasm of left breast: Secondary | ICD-10-CM | POA: Insufficient documentation

## 2016-09-13 DIAGNOSIS — Z6839 Body mass index (BMI) 39.0-39.9, adult: Secondary | ICD-10-CM | POA: Diagnosis not present

## 2016-09-13 DIAGNOSIS — G8929 Other chronic pain: Secondary | ICD-10-CM | POA: Diagnosis not present

## 2016-09-13 DIAGNOSIS — Z96651 Presence of right artificial knee joint: Secondary | ICD-10-CM | POA: Diagnosis not present

## 2016-09-13 DIAGNOSIS — I739 Peripheral vascular disease, unspecified: Secondary | ICD-10-CM | POA: Diagnosis not present

## 2016-09-13 DIAGNOSIS — Z880 Allergy status to penicillin: Secondary | ICD-10-CM | POA: Diagnosis not present

## 2016-09-13 DIAGNOSIS — Z79899 Other long term (current) drug therapy: Secondary | ICD-10-CM | POA: Diagnosis not present

## 2016-09-13 DIAGNOSIS — D369 Benign neoplasm, unspecified site: Secondary | ICD-10-CM

## 2016-09-13 DIAGNOSIS — M1990 Unspecified osteoarthritis, unspecified site: Secondary | ICD-10-CM | POA: Diagnosis not present

## 2016-09-13 HISTORY — PX: BREAST LUMPECTOMY WITH NEEDLE LOCALIZATION: SHX5759

## 2016-09-13 SURGERY — BREAST LUMPECTOMY WITH NEEDLE LOCALIZATION
Anesthesia: General | Site: Breast | Laterality: Left

## 2016-09-13 MED ORDER — BUPIVACAINE HCL (PF) 0.25 % IJ SOLN
INTRAMUSCULAR | Status: AC
  Filled 2016-09-13: qty 30

## 2016-09-13 MED ORDER — PROPOFOL 10 MG/ML IV BOLUS
INTRAVENOUS | Status: AC
  Filled 2016-09-13: qty 20

## 2016-09-13 MED ORDER — PHENYLEPHRINE HCL 10 MG/ML IJ SOLN
INTRAMUSCULAR | Status: DC | PRN
  Administered 2016-09-13: 80 ug via INTRAVENOUS

## 2016-09-13 MED ORDER — LIDOCAINE 2% (20 MG/ML) 5 ML SYRINGE
INTRAMUSCULAR | Status: AC
  Filled 2016-09-13: qty 5

## 2016-09-13 MED ORDER — ONDANSETRON HCL 4 MG/2ML IJ SOLN
INTRAMUSCULAR | Status: AC
  Filled 2016-09-13: qty 2

## 2016-09-13 MED ORDER — BUPIVACAINE HCL (PF) 0.25 % IJ SOLN
INTRAMUSCULAR | Status: DC | PRN
  Administered 2016-09-13: 2 mL

## 2016-09-13 MED ORDER — HYDROCODONE-ACETAMINOPHEN 5-325 MG PO TABS: 1.0000 | ORAL_TABLET | ORAL | Status: DC | PRN

## 2016-09-13 MED ORDER — DIPHENHYDRAMINE HCL 50 MG/ML IJ SOLN
INTRAMUSCULAR | Status: AC
  Filled 2016-09-13: qty 1

## 2016-09-13 MED ORDER — DOCUSATE SODIUM 100 MG PO CAPS: 100.0000 mg | ORAL_CAPSULE | Freq: Two times a day (BID) | ORAL | 0 refills | Status: AC

## 2016-09-13 MED ORDER — DEXAMETHASONE SODIUM PHOSPHATE 10 MG/ML IJ SOLN
INTRAMUSCULAR | Status: DC | PRN
  Administered 2016-09-13: 5 mg via INTRAVENOUS

## 2016-09-13 MED ORDER — CHLORHEXIDINE GLUCONATE 4 % EX LIQD: 60.0000 mL | Freq: Once | CUTANEOUS | Status: DC

## 2016-09-13 MED ORDER — MIDAZOLAM HCL 5 MG/5ML IJ SOLN
INTRAMUSCULAR | Status: DC | PRN
  Administered 2016-09-13: 2 mg via INTRAVENOUS

## 2016-09-13 MED ORDER — ONDANSETRON HCL 4 MG/2ML IJ SOLN
INTRAMUSCULAR | Status: DC | PRN
  Administered 2016-09-13: 4 mg via INTRAVENOUS

## 2016-09-13 MED ORDER — LACTATED RINGERS IV SOLN
INTRAVENOUS | Status: DC
  Administered 2016-09-13: 09:00:00 via INTRAVENOUS

## 2016-09-13 MED ORDER — PROPOFOL 10 MG/ML IV BOLUS
INTRAVENOUS | Status: DC | PRN
  Administered 2016-09-13: 200 mg via INTRAVENOUS

## 2016-09-13 MED ORDER — FENTANYL CITRATE (PF) 100 MCG/2ML IJ SOLN
INTRAMUSCULAR | Status: DC | PRN
  Administered 2016-09-13 (×3): 50 ug via INTRAVENOUS

## 2016-09-13 MED ORDER — FENTANYL CITRATE (PF) 100 MCG/2ML IJ SOLN
INTRAMUSCULAR | Status: AC
  Filled 2016-09-13: qty 2

## 2016-09-13 MED ORDER — MIDAZOLAM HCL 2 MG/2ML IJ SOLN
INTRAMUSCULAR | Status: AC
  Filled 2016-09-13: qty 2

## 2016-09-13 MED ORDER — SUGAMMADEX SODIUM 200 MG/2ML IV SOLN
INTRAVENOUS | Status: AC
  Filled 2016-09-13: qty 2

## 2016-09-13 MED ORDER — DIPHENHYDRAMINE HCL 50 MG/ML IJ SOLN
INTRAMUSCULAR | Status: DC | PRN
  Administered 2016-09-13: 25 mg via INTRAVENOUS

## 2016-09-13 MED ORDER — LIDOCAINE HCL (CARDIAC) 20 MG/ML IV SOLN
INTRAVENOUS | Status: DC | PRN
  Administered 2016-09-13: 100 mg via INTRATRACHEAL

## 2016-09-13 MED ORDER — FENTANYL CITRATE (PF) 100 MCG/2ML IJ SOLN
25.0000 ug | INTRAMUSCULAR | Status: DC | PRN
  Administered 2016-09-13 (×4): 25 ug via INTRAVENOUS

## 2016-09-13 MED ORDER — 0.9 % SODIUM CHLORIDE (POUR BTL) OPTIME
TOPICAL | Status: DC | PRN
  Administered 2016-09-13: 1000 mL

## 2016-09-13 MED ORDER — VANCOMYCIN HCL IN DEXTROSE 1-5 GM/200ML-% IV SOLN
1000.0000 mg | INTRAVENOUS | Status: AC
  Administered 2016-09-13: 1000 mg via INTRAVENOUS
  Filled 2016-09-13: qty 200

## 2016-09-13 MED ORDER — CHLORHEXIDINE GLUCONATE CLOTH 2 % EX PADS: 6.0000 | MEDICATED_PAD | Freq: Once | CUTANEOUS | Status: DC

## 2016-09-13 MED ORDER — PROMETHAZINE HCL 25 MG/ML IJ SOLN: 6.2500 mg | INTRAMUSCULAR | Status: DC | PRN

## 2016-09-13 MED ORDER — FENTANYL CITRATE (PF) 100 MCG/2ML IJ SOLN
INTRAMUSCULAR | Status: AC
  Filled 2016-09-13: qty 4

## 2016-09-13 MED ORDER — HYDROCODONE-ACETAMINOPHEN 5-325 MG PO TABS: 1.0000 | ORAL_TABLET | Freq: Four times a day (QID) | ORAL | 0 refills | Status: DC | PRN

## 2016-09-13 MED ORDER — DEXAMETHASONE SODIUM PHOSPHATE 10 MG/ML IJ SOLN
INTRAMUSCULAR | Status: AC
  Filled 2016-09-13: qty 1

## 2016-09-13 SURGICAL SUPPLY — 43 items
APL SKNCLS STERI-STRIP NONHPOA (GAUZE/BANDAGES/DRESSINGS)
APPLIER CLIP 9.375 MED OPEN (MISCELLANEOUS)
APR CLP MED 9.3 20 MLT OPN (MISCELLANEOUS)
BENZOIN TINCTURE PRP APPL 2/3 (GAUZE/BANDAGES/DRESSINGS) ×1 IMPLANT
BINDER BREAST LRG (GAUZE/BANDAGES/DRESSINGS) IMPLANT
BINDER BREAST XLRG (GAUZE/BANDAGES/DRESSINGS) ×2 IMPLANT
BLADE SURG ROTATE 9660 (MISCELLANEOUS) IMPLANT
CHLORAPREP W/TINT 26ML (MISCELLANEOUS) ×3 IMPLANT
CLIP APPLIE 9.375 MED OPEN (MISCELLANEOUS) IMPLANT
CLIP TI WIDE RED SMALL 24 (CLIP) IMPLANT
CLOSURE WOUND 1/2 X4 (GAUZE/BANDAGES/DRESSINGS)
CONT SPEC 4OZ CLIKSEAL STRL BL (MISCELLANEOUS) ×1 IMPLANT
COVER SURGICAL LIGHT HANDLE (MISCELLANEOUS) ×3 IMPLANT
DEVICE DUBIN SPECIMEN MAMMOGRA (MISCELLANEOUS) ×2 IMPLANT
DRAPE LAPAROTOMY TRNSV 102X78 (DRAPE) ×3 IMPLANT
DRAPE UTILITY XL STRL (DRAPES) ×4 IMPLANT
ELECT REM PT RETURN 9FT ADLT (ELECTROSURGICAL) ×3
ELECTRODE REM PT RTRN 9FT ADLT (ELECTROSURGICAL) ×1 IMPLANT
GAUZE SPONGE 4X4 12PLY STRL (GAUZE/BANDAGES/DRESSINGS) ×3 IMPLANT
GLOVE BIO SURGEON STRL SZ7 (GLOVE) ×3 IMPLANT
GLOVE BIOGEL PI IND STRL 7.5 (GLOVE) ×1 IMPLANT
GLOVE BIOGEL PI INDICATOR 7.5 (GLOVE) ×2
GOWN STRL REUS W/ TWL LRG LVL3 (GOWN DISPOSABLE) ×2 IMPLANT
GOWN STRL REUS W/TWL LRG LVL3 (GOWN DISPOSABLE) ×6
KIT BASIN OR (CUSTOM PROCEDURE TRAY) ×3 IMPLANT
KIT MARKER MARGIN INK (KITS) ×2 IMPLANT
KIT ROOM TURNOVER OR (KITS) ×3 IMPLANT
LIGHT WAVEGUIDE WIDE FLAT (MISCELLANEOUS) ×1 IMPLANT
NDL HYPO 25GX1X1/2 BEV (NEEDLE) ×1 IMPLANT
NEEDLE HYPO 25GX1X1/2 BEV (NEEDLE) ×3 IMPLANT
NS IRRIG 1000ML POUR BTL (IV SOLUTION) ×3 IMPLANT
PACK GENERAL/GYN (CUSTOM PROCEDURE TRAY) ×3 IMPLANT
PAD ARMBOARD 7.5X6 YLW CONV (MISCELLANEOUS) ×6 IMPLANT
SPECIMEN JAR MEDIUM (MISCELLANEOUS) ×3 IMPLANT
SPONGE LAP 4X18 X RAY DECT (DISPOSABLE) ×3 IMPLANT
STAPLER VISISTAT 35W (STAPLE) ×1 IMPLANT
STRIP CLOSURE SKIN 1/2X4 (GAUZE/BANDAGES/DRESSINGS) ×1 IMPLANT
SUT MNCRL AB 4-0 PS2 18 (SUTURE) ×3 IMPLANT
SUT VIC AB 3-0 SH 27 (SUTURE) ×3
SUT VIC AB 3-0 SH 27X BRD (SUTURE) ×1 IMPLANT
SYR CONTROL 10ML LL (SYRINGE) ×3 IMPLANT
TOWEL OR 17X24 6PK STRL BLUE (TOWEL DISPOSABLE) ×3 IMPLANT
TOWEL OR 17X26 10 PK STRL BLUE (TOWEL DISPOSABLE) ×1 IMPLANT

## 2016-09-13 NOTE — Transfer of Care (Signed)
Immediate Anesthesia Transfer of Care Note  Patient: Amanda Christensen  Procedure(s) Performed: Procedure(s): LEFT BREAST LUMPECTOMY WITH NEEDLE LOCALIZATION (Left)  Patient Location: PACU  Anesthesia Type:General  Level of Consciousness: awake, alert  and oriented  Airway & Oxygen Therapy: Patient Spontanous Breathing and Patient connected to nasal cannula oxygen  Post-op Assessment: Report given to RN and Post -op Vital signs reviewed and stable  Post vital signs: Reviewed and stable  Last Vitals:  Vitals:   09/13/16 0911 09/13/16 1052  BP: (!) 248/73   Pulse: 76   Resp: 20   Temp: 36.5 C (P) 36.5 C    Last Pain:  Vitals:   09/13/16 0911  TempSrc: Oral         Complications: No apparent anesthesia complications

## 2016-09-13 NOTE — Anesthesia Procedure Notes (Signed)
Procedure Name: LMA Insertion Date/Time: 09/13/2016 9:59 AM Performed by: Mariea Clonts Pre-anesthesia Checklist: Patient identified, Emergency Drugs available, Suction available and Patient being monitored Patient Re-evaluated:Patient Re-evaluated prior to inductionOxygen Delivery Method: Circle System Utilized Preoxygenation: Pre-oxygenation with 100% oxygen Intubation Type: IV induction Ventilation: Mask ventilation without difficulty LMA: LMA inserted LMA Size: 4.0 Number of attempts: 1 Airway Equipment and Method: Bite block Placement Confirmation: positive ETCO2 Tube secured with: Tape Dental Injury: Teeth and Oropharynx as per pre-operative assessment

## 2016-09-13 NOTE — Op Note (Signed)
Operative Note  Amanda Christensen 55 y.o. female [id number]  09/13/2016  Surgeon: Clovis Riley   Assistant: none  Procedure performed: needle localized lumpectomy  Preop diagnosis: intraductal papilloma- 0.7x0.3x0.2cm, appx 2cm from the nipple.  Post-op diagnosis/intraop findings: same  Specimens: left breast lumpectomy specimen  EBL: minimal  Complications: none  Description of procedure: After obtaining informed consent the patient was brought to the operating room. Prophylactic antibiotics and subcutaneous heparin were administered. SCD's were applied. General endotracheal anesthesia was initiated and a formal time-out was performed. The left breast and chest were prepped and draped in the usual sterile fashion.  After injection of local a 3cm incision was made in the left breast lateral to the areola and the soft tissues were dissected with cautery until the wire could be brought into the wound from the skin. Dissection was continued using cautery to excise the specimen which was xray'd in the OR and found to include both the clip and the wire. Hemostasis was meticulously ensured within the wound. Closure with deep dermal 3-0 vicryls and subcuticular monocryl followed by application of dermabond. The patient was then awakened, extubated and taken to PACU in stable condition.  All counts were correct at the completion of the case.

## 2016-09-13 NOTE — Anesthesia Preprocedure Evaluation (Addendum)
Anesthesia Evaluation  Patient identified by MRN, date of birth, ID band Patient awake    Reviewed: Allergy & Precautions, NPO status , Patient's Chart, lab work & pertinent test results  History of Anesthesia Complications Negative for: history of anesthetic complications  Airway Mallampati: II  TM Distance: >3 FB Neck ROM: Full    Dental  (+) Teeth Intact, Dental Advisory Given   Pulmonary asthma , COPD,  COPD inhaler, former smoker,    Pulmonary exam normal breath sounds clear to auscultation       Cardiovascular hypertension, Pt. on medications + Peripheral Vascular Disease and + DVT  Normal cardiovascular exam Rhythm:Regular Rate:Normal     Neuro/Psych Seizures -,  PSYCHIATRIC DISORDERS Depression    GI/Hepatic negative GI ROS, Neg liver ROS,   Endo/Other  Obesity   Renal/GU negative Renal ROS     Musculoskeletal  (+) Arthritis , Osteoarthritis,    Abdominal   Peds  Hematology negative hematology ROS (+)   Anesthesia Other Findings Day of surgery medications reviewed with the patient.  Reproductive/Obstetrics                            Anesthesia Physical Anesthesia Plan  ASA: II  Anesthesia Plan: General   Post-op Pain Management:    Induction: Intravenous  Airway Management Planned: LMA  Additional Equipment:   Intra-op Plan:   Post-operative Plan: Extubation in OR  Informed Consent: I have reviewed the patients History and Physical, chart, labs and discussed the procedure including the risks, benefits and alternatives for the proposed anesthesia with the patient or authorized representative who has indicated his/her understanding and acceptance.   Dental advisory given  Plan Discussed with: CRNA  Anesthesia Plan Comments: (Risks/benefits of general anesthesia discussed with patient including risk of damage to teeth, lips, gum, and tongue, nausea/vomiting, allergic  reactions to medications, and the possibility of heart attack, stroke and death.  All patient questions answered.  Patient wishes to proceed.)        Anesthesia Quick Evaluation

## 2016-09-13 NOTE — Transfer of Care (Deleted)
Immediate Anesthesia Transfer of Care Note  Patient: Amanda Christensen  Procedure(s) Performed: Procedure(s): LEFT BREAST LUMPECTOMY WITH NEEDLE LOCALIZATION (Left)  Patient Location: PACU  Anesthesia Type:General  Level of Consciousness: awake, alert  and oriented  Airway & Oxygen Therapy: Patient Spontanous Breathing and Patient connected to nasal cannula oxygen  Post-op Assessment: Report given to RN, Post -op Vital signs reviewed and stable and Patient moving all extremities X 4  Post vital signs: Reviewed and stable  Last Vitals:  Vitals:   09/13/16 0911 09/13/16 1052  BP: (!) 248/73   Pulse: 76   Resp: 20   Temp: 36.5 C (P) 36.5 C    Last Pain:  Vitals:   09/13/16 0911  TempSrc: Oral         Complications: No apparent anesthesia complications

## 2016-09-13 NOTE — Discharge Instructions (Signed)
[facility] Office Phone Number [phone]  BREAST BIOPSY/ PARTIAL MASTECTOMY: POST OP INSTRUCTIONS  Always review your discharge instruction sheet given to you by the facility where your surgery was performed.  IF YOU HAVE DISABILITY OR FAMILY LEAVE FORMS, YOU MUST BRING THEM TO THE OFFICE FOR PROCESSING.  DO NOT GIVE THEM TO YOUR DOCTOR.  1. A prescription for pain medication may be given to you upon discharge.  Take your pain medication as prescribed, if needed.  If narcotic pain medicine is not needed, then you may take acetaminophen (Tylenol) or ibuprofen (Advil) as needed. 2. Take your usually prescribed medications unless otherwise directed 3. If you need a refill on your pain medication, please contact your pharmacy.  They will contact our office to request authorization.  Prescriptions will not be filled after 5pm or on week-ends. 4. You should eat very light the first 24 hours after surgery, such as soup, crackers, pudding, etc.  Resume your normal diet the day after surgery. 5. Most patients will experience some swelling and bruising in the breast.  Ice packs and a good support bra will help.  Swelling and bruising can take several days to resolve.  6. It is common to experience some constipation if taking pain medication after surgery.  Increasing fluid intake and taking a stool softener will usually help or prevent this problem from occurring.  A mild laxative (Milk of Magnesia or Miralax) should be taken according to package directions if there are no bowel movements after 48 hours. 7. Unless discharge instructions indicate otherwise, you may remove your bandages 24-48 hours after surgery, and you may shower at that time.  You may have steri-strips (small skin tapes) in place directly over the incision.  These strips should be left on the skin for 7-10 days.  If your surgeon used skin glue on the incision, you may shower in 24 hours.  The glue will flake off over the  next 2-3 weeks.  Any sutures or staples will be removed at the office during your follow-up visit. 8. ACTIVITIES:  You may resume regular daily activities (gradually increasing) beginning the next day.  Wearing a good support bra or sports bra minimizes pain and swelling.  You may have sexual intercourse when it is comfortable. a. You may drive when you no longer are taking prescription pain medication, you can comfortably wear a seatbelt, and you can safely maneuver your car and apply brakes. b. RETURN TO WORK:  _____2 weeks_________________________________________________________________________________ 9. You should see your doctor in the office for a follow-up appointment approximately two weeks after your surgery.  Your doctors nurse will typically make your follow-up appointment when she calls you with your pathology report.  Expect your pathology report 2-3 business days after your surgery.  You may call to check if you do not hear from [place] after three days. 10. OTHER INSTRUCTIONS: _______________________________________________________________________________________________ _____________________________________________________________________________________________________________________________________ _____________________________________________________________________________________________________________________________________ _____________________________________________________________________________________________________________________________________  WHEN TO CALL YOUR DOCTOR: 1. Fever over 101.0 2. Nausea and/or vomiting. 3. Extreme swelling or bruising. 4. Continued bleeding from incision. 5. Increased pain, redness, or drainage from the incision.  The clinic staff is available to answer your questions during regular business hours.  Please dont hesitate to call and ask to speak to one of the nurses for clinical concerns.  If you have a medical emergency, go to the  nearest emergency room or call 911.  A surgeon from [facility] is always on call at the hospital.  For further questions, please visit [email or web address]

## 2016-09-13 NOTE — H&P (Signed)
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 No changes since office visit last month.  Allergies  Allergen Reactions  . Penicillins Other (See Comments)    syncope Has patient had a PCN reaction causing immediate rash, facial/tongue/throat swelling, SOB or lightheadedness with hypotension: Yes Has patient had a PCN reaction causing severe rash involving mucus membranes or skin necrosis: No Has patient had a PCN reaction that required hospitalization No Has patient had a PCN reaction occurring within the last 10 years: No If all of the above answers are "NO", then may proceed with Cephalosporin use.   . Tomato Hives    From acid in food  . Oxycodone Itching    Past Medical History:  Diagnosis Date  . Arthritis   . Asthma   . Chronic pain   . COPD (chronic obstructive pulmonary disease) ([place])   . Depression    history of   . 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 Heart murmur   . History of anxiety    "used to have anxiety attacks"  . History of bronchitis   . Hypertension   . Obesity   . Peripheral vascular disease ([facility]) 10   rt leg dvt   . Seizures ([place]) 03   was on medications none now off  since 2011  . Shortness of breath dyspnea     Past Surgical History:  Procedure Laterality Date  . CESAREAN SECTION    . COLONOSCOPY    . DILATION AND CURETTAGE OF UTERUS    . HERNIA REPAIR     umbilical  . JOINT REPLACEMENT    . miscarriage    . ROTATOR CUFF REPAIR Left   . TOTAL KNEE ARTHROPLASTY Right   . TOTAL SHOULDER ARTHROPLASTY Left 01/08/2015   dr Veverly Fells  . TOTAL SHOULDER ARTHROPLASTY Left 01/08/2015   Procedure: LEFT TOTAL SHOULDER ARTHROPLASTY;  Surgeon: Augustin Schooling, MD;  Location: [facility];  Service: Orthopedics;  Laterality: Left;    Family History  Problem Relation Age of Onset  . Cancer Father   . Hypertension       family history  . Cancer      family history  . Drug abuse      family history  . Other      family history of psychiatric problems, disabilities    Social History   Social History  . Marital status: Legally Separated    Spouse name: N/A  . Number of children: N/A  . Years of education: N/A   Social History Main Topics  . Smoking status: Former Smoker    Packs/day: 0.50    Years: 17.00    Types: Cigarettes    Quit date: 11/20/2012  . Smokeless tobacco: Former Systems developer  . Alcohol use No  . Drug use: No  . Sexual activity: Yes   Other Topics Concern  . None   Social History Narrative  . None    No current facility-administered medications on file prior to encounter.    Current Outpatient Prescriptions on File Prior to Encounter  Medication Sig Dispense Refill  . Ascorbic Acid (VITAMIN C PO) Take 1 tablet by mouth at bedtime.     . cetirizine (ZYRTEC) 10 MG tablet Take 10 mg by mouth at bedtime.     . Cholecalciferol (VITAMIN D PO) Take 1 tablet by mouth at bedtime.     Marland Kitchen  diclofenac sodium (VOLTAREN) 1 % GEL Apply 2 g topically 4 (four) times daily. 100 g 0  . fluticasone (FLONASE) 50 MCG/ACT nasal spray Place 1 spray into both nostrils daily as needed for allergies or rhinitis.    Marland Kitchen lisinopril (PRINIVIL,ZESTRIL) 20 MG tablet Take 20 mg by mouth daily.    . methocarbamol (ROBAXIN) 500 MG tablet Take 1 tablet (500 mg total) by mouth 3 (three) times daily as needed. 60 tablet 1  . montelukast (SINGULAIR) 10 MG tablet Take 10 mg by mouth at bedtime.    . traZODone (DESYREL) 100 MG tablet Take 100 mg by mouth at bedtime.    . triamcinolone cream (KENALOG) 0.1 % Apply 1 application topically 2 (two) times daily.    . verapamil (VERELAN PM) 240 MG 24 hr capsule Take 240 mg by mouth at bedtime.     Marland Kitchen albuterol (PROVENTIL HFA;VENTOLIN HFA) 108 (90 BASE) MCG/ACT inhaler Inhale 1-2 puffs into the lungs every 6 (six) hours as needed for wheezing or shortness of breath. 1 Inhaler 0     Review of Systems: a complete, 10pt review of systems was completed with pertinent positives and negatives as documented in the HPI.   Physical Exam: Vitals:   09/13/16 0911  BP: (!) 248/73  Pulse: 76  Resp: 20  Temp: 97.7 F (36.5 C)   Gen: A&Ox3, no distress  H&N: normocephalic, atraumatic, EOMI, anicteric. Neck supple without mass or thyromegaly Chest: unlabored respirations  RRR with palpable distal pulses Abdomen: s/nt/nd Extremities: warm, without edema, no deformities  Neuro: grossly intact Psych: appropriate mood and affect  Breast: left breast s/p wire loc. Marked.   CBC Latest Ref Rng & Units 09/07/2016 09/28/2015 01/09/2015  WBC 4.0 - 10.5 K/uL 3.5(L) 6.5 -  Hemoglobin 12.0 - 15.0 g/dL 13.9 13.8 11.7(L)  Hematocrit 36.0 - 46.0 % 43.3 43.4 37.2  Platelets 150 - 400 K/uL 192 182 -    CMP Latest Ref Rng & Units 09/07/2016 09/28/2015 01/09/2015  Glucose 65 - 99 mg/dL 80 108(H) 125(H)  BUN 6 - 20 mg/dL 16 10 9   Creatinine 0.44 - 1.00 mg/dL 0.84 1.16(H) 0.91  Sodium 135 - 145 mmol/L 141 139 138  Potassium 3.5 - 5.1 mmol/L 3.5 4.0 4.5  Chloride 101 - 111 mmol/L 105 100(L) 103  CO2 22 - 32 mmol/L 29 30 30   Calcium 8.9 - 10.3 mg/dL 9.6 9.6 9.0  Total Protein 6.5 - 8.1 g/dL - 6.5 -  Total Bilirubin 0.3 - 1.2 mg/dL - 0.7 -  Alkaline Phos 38 - 126 U/L - 93 -  AST 15 - 41 U/L - 26 -  ALT 14 - 54 U/L - 16 -    Lab Results  Component Value Date   INR 0.97 12/29/2014   INR 2.17 (H) 03/27/2014   INR 3.83 (H) 03/26/2014    Imaging: Needle loc images  A/P: L breast intraductal papilloma for needle localized excision. Proceed to [place]. Discharge home post op.    Romana Juniper, MD [facility], [place] Pager [phone]

## 2016-09-14 ENCOUNTER — Encounter (HOSPITAL_COMMUNITY): Payer: Self-pay | Admitting: Surgery

## 2016-09-14 NOTE — Anesthesia Postprocedure Evaluation (Signed)
Anesthesia Post Note  Patient: Amanda Christensen  Procedure(s) Performed: Procedure(s) (LRB): LEFT BREAST LUMPECTOMY WITH NEEDLE LOCALIZATION (Left)  Patient location during evaluation: PACU Anesthesia Type: General Level of consciousness: awake and alert Pain management: pain level controlled Vital Signs Assessment: post-procedure vital signs reviewed and stable Respiratory status: spontaneous breathing, nonlabored ventilation, respiratory function stable and patient connected to nasal cannula oxygen Cardiovascular status: stable and blood pressure returned to baseline Postop Assessment: no signs of nausea or vomiting Anesthetic complications: no    Last Vitals:  Vitals:   09/13/16 1201 09/13/16 1208  BP:  (!) 150/85  Pulse:  (!) 55  Resp:  16  Temp: 36.3 C     Last Pain:  Vitals:   09/13/16 1208  TempSrc:   PainSc: 2                  Catalina Gravel

## 2016-09-15 ENCOUNTER — Encounter (HOSPITAL_COMMUNITY): Payer: Self-pay

## 2016-09-15 ENCOUNTER — Encounter (HOSPITAL_COMMUNITY)
Admission: RE | Admit: 2016-09-15 | Discharge: 2016-09-15 | Disposition: A | Discharge status: Home / Self Care | Payer: Medicaid Other | Source: Ambulatory Visit | Attending: Orthopedic Surgery | Admitting: Orthopedic Surgery

## 2016-09-15 ENCOUNTER — Ambulatory Visit (HOSPITAL_COMMUNITY)
Admission: RE | Admit: 2016-09-15 | Discharge: 2016-09-15 | Disposition: A | Discharge status: Home / Self Care | Payer: Medicaid Other | Source: Ambulatory Visit | Attending: Orthopedic Surgery | Admitting: Orthopedic Surgery

## 2016-09-15 DIAGNOSIS — Z01818 Encounter for other preprocedural examination: Secondary | ICD-10-CM | POA: Diagnosis not present

## 2016-09-15 DIAGNOSIS — M1712 Unilateral primary osteoarthritis, left knee: Secondary | ICD-10-CM | POA: Diagnosis not present

## 2016-09-15 DIAGNOSIS — Z0183 Encounter for blood typing: Secondary | ICD-10-CM | POA: Insufficient documentation

## 2016-09-15 DIAGNOSIS — Z96612 Presence of left artificial shoulder joint: Secondary | ICD-10-CM | POA: Insufficient documentation

## 2016-09-15 DIAGNOSIS — Z01812 Encounter for preprocedural laboratory examination: Secondary | ICD-10-CM | POA: Diagnosis not present

## 2016-09-15 HISTORY — DX: Insomnia, unspecified: G47.00

## 2016-09-15 LAB — CBC WITH DIFFERENTIAL/PLATELET
Basophils Absolute: 0 10*3/uL (ref 0.0–0.1)
Basophils Relative: 0 %
EOS ABS: 0.1 10*3/uL (ref 0.0–0.7)
EOS PCT: 2 %
HCT: 42.4 % (ref 36.0–46.0)
HEMOGLOBIN: 13.7 g/dL (ref 12.0–15.0)
LYMPHS ABS: 3.1 10*3/uL (ref 0.7–4.0)
LYMPHS PCT: 57 %
MCH: 28.4 pg (ref 26.0–34.0)
MCHC: 32.3 g/dL (ref 30.0–36.0)
MCV: 88 fL (ref 78.0–100.0)
MONOS PCT: 12 %
Monocytes Absolute: 0.7 10*3/uL (ref 0.1–1.0)
NEUTROS PCT: 29 %
Neutro Abs: 1.6 10*3/uL — ABNORMAL LOW (ref 1.7–7.7)
Platelets: 197 10*3/uL (ref 150–400)
RBC: 4.82 MIL/uL (ref 3.87–5.11)
RDW: 12.7 % (ref 11.5–15.5)
WBC: 5.5 10*3/uL (ref 4.0–10.5)

## 2016-09-15 LAB — TYPE AND SCREEN
ABO/RH(D): O POS
Antibody Screen: NEGATIVE

## 2016-09-15 LAB — URINALYSIS, ROUTINE W REFLEX MICROSCOPIC
Bilirubin Urine: NEGATIVE
GLUCOSE, UA: NEGATIVE mg/dL
HGB URINE DIPSTICK: NEGATIVE
Ketones, ur: NEGATIVE mg/dL
Nitrite: NEGATIVE
PH: 6 (ref 5.0–8.0)
PROTEIN: NEGATIVE mg/dL
SPECIFIC GRAVITY, URINE: 1.024 (ref 1.005–1.030)

## 2016-09-15 LAB — COMPREHENSIVE METABOLIC PANEL
ALK PHOS: 86 U/L (ref 38–126)
ALT: 18 U/L (ref 14–54)
ANION GAP: 9 (ref 5–15)
AST: 29 U/L (ref 15–41)
Albumin: 4.1 g/dL (ref 3.5–5.0)
BUN: 15 mg/dL (ref 6–20)
CALCIUM: 9.4 mg/dL (ref 8.9–10.3)
CO2: 25 mmol/L (ref 22–32)
CREATININE: 0.93 mg/dL (ref 0.44–1.00)
Chloride: 106 mmol/L (ref 101–111)
Glucose, Bld: 89 mg/dL (ref 65–99)
Potassium: 3.4 mmol/L — ABNORMAL LOW (ref 3.5–5.1)
SODIUM: 140 mmol/L (ref 135–145)
TOTAL PROTEIN: 7.1 g/dL (ref 6.5–8.1)
Total Bilirubin: 0.7 mg/dL (ref 0.3–1.2)

## 2016-09-15 LAB — URINE MICROSCOPIC-ADD ON

## 2016-09-15 LAB — PROTIME-INR
INR: 0.95
PROTHROMBIN TIME: 12.7 s (ref 11.4–15.2)

## 2016-09-15 LAB — APTT: aPTT: 29 seconds (ref 24–36)

## 2016-09-15 LAB — SURGICAL PCR SCREEN
MRSA, PCR: NEGATIVE
Staphylococcus aureus: NEGATIVE

## 2016-09-15 NOTE — H&P (Signed)
CHIEF COMPLAINT:  Painful left knee.   HPI:  Amanda Christensen is a 55 year old African American female whom I see today for evaluation of her left knee. She has been recently seen with several months of worsening knee pain and discomfort in the left knee. She has had problems over the past several years and at one point she was told she had arthritis. She is status post a right total knee arthroplasty which she does not have full motion. She has had continued pain and discomfort in the knee. She had a corticosteroid injection on 09-03-16 and this only worked for a very short period of time. She was seen in the office on 08-11-16 and at that time she was not having pain to the point she was having pain with every step as well as nighttime pain. She is having pain with activities of daily living and requires the use of Tramadol and other anti-inflammatories.  She has not had viscosupplementation and actually had a possible scheduling for that but her symptoms are worsened to the point now she has pain with her activities of daily living. She has tried activity modification which has not been helpful either. She is seen today for evaluation.  Her past medical history and general health is fair.   Hospitalizations:   1. Q000111Q for umbilical herniorrhaphy.  2.  1987 miscarriage. 3. 1989 C-section. 4. 2012 right total knee arthroplasty. 5. 2016 left shoulder repair and replacement. 6. Three children.    Medications:  Folic Acid Q000111Q mg daily. Aspirin 81 mg daily. Tramadol 50 mg every six hours p.r.n. for pain.  Meloxicam 15 mg daily. Zyrtec 10 mg one daily. Verapamil 240 mg HS. Trazodone 100 mg HS. Flexeril 10 mg daily. Singulair 10 mg HS. Lisinopril 20 mg q am.  Vitamin C 500 mg and D daily. Restasis every 12 hours to the eyes.  ALLERGIES:   PERCOCET WHICH CAUSED HER ITCHING. SHE HAD A SYNCOPAL EPISODE ONCE WITH PENICILLIN AND THEY TOLD HER SHE HAD ALLERGIES TO THAT.  Review of systems: The fourteen  point review of systems is positive for a history of chronic obstructive lung disease secondary to cigarette smoking. She has an occasional shortness of breath and she states that is only with excitement.  The remainder of her review of systems is benign.   Family history:  Mother is alive at the age of 31 and has hypertension. Father died at the age of 63 from prostate cancer with metastasis to the bone.  One brother age 47 and healthy. Two sisters ages 17 and 32 also healthy.  Social History: Amanda Christensen is a 55 year old African American female who is separated and disabled. She quit smoking cigarettes in 2009, but prior to that she was smoking one pack a day for 17 years. She denies the use of alcohol.   EXAMINATION: General:  She is a 55 year old African American female who is well-developed, well-nourished, cooperative and in mild distress secondary to left knee pain. Height: 5\' 0"    Weight: 197 pounds.   Vital Signs: Temperature:  97.7 Pulse: 67 Respirations:  Normal.  Oxygen Saturation:  95%.  Blood pressure 112/71.  Head: Normocephalic. Eyes: PERRLA with extraocular motion is intact. Ears, nose and throat:  Benign.  Chest: Good expansion.  Lungs:  Essentially clear.  Cardiac: She had a regular rhythm and rate. Normal S1 and S2. No murmur was appreciated.  Pulses:  2+ bilateral, symmetric in posterior tib bilaterally. Abdomen:  Scaphoid, soft  and nontender. No masses palpable. Normal bowel sounds present. Neck: Supple. No carotid bruits.  CNS:   She is oriented times three. Cranial nerves II-XII grossly intact.  Genital, rectal and breast exam: Not indicated for an orthopedic evaluation. Musculoskeletal:  Today, she has a trace effusion but this is difficult to tell for sure secondary to the size of her knee. She has a range of motion of 5-115 degrees. She has crepitus with range of motion about the joint.   She is tender above the joint line. She has a pseudolaxity where she opens medially  and does have a good endpoint.   X-RAYS: Radiographs reveal moderate degenerative changes in the left knee. Overall the alignment is mild varus. Her bone quality is good.   IMPRESSION:  1. Endstage osteoarthritis of the left knee.  2. Chronic obstructive lung disease.   PLAN:  At this time we feel she is a candidate for a left total knee arthroplasty. The patient's history reveals advanced degenerative joint disease of the left knee and she is failing conservative treatment. Clearance notes were reviewed. After discussion with the patient it was felt that a total knee arthroplasty was indicated. The procedure, risks and benefits of a total knee arthroscopy were presented and reviewed. The risks include but are not limited to aseptic loosening, infection, blood clots, vascular and nerve injury, stiffness, patella tracking problems and fracture complications among others discussed.  The patient acknowledged the explanation and agreed to a total knee arthroplasty.   Mike Craze Raksha Wolfgang, PA-C  09/15/2016 12:10 PM

## 2016-09-15 NOTE — Pre-Procedure Instructions (Signed)
Amanda Christensen  09/15/2016      [facility] [place], [id number] [place] [address] Phone: [phone] Fax: [phone]  [facility] [place], [place] [address] Phone: [phone] Fax: [place], [place] [id number] [place] [id number] [place] [address] Phone: [phone] Fax: [phone]    Your procedure is scheduled on Wed, Nov 8 @ 11:00 AM  Report to [place] at 9:00 AM  Call this number if you have problems the morning of surgery:  [phone]   Remember:  Do not eat food or drink liquids after midnight.  Take these medicines the morning of surgery with A SIP OF WATER Albuterol<Bring Your Inhaler With You>, Advair,Eye Drops,and Pain Pill(if needed)                Stop taking your Ibuprofen and Mobic. No Goody's,BC's,Aleve,Advil,Motrin,Fish Oil,or any Herbal Medications.    Do not wear jewelry, make-up or nail polish.  Do not wear lotions, powders,perfumes, or deoderant.  Do not shave 48 hours prior to surgery.    Do not bring valuables to the hospital.  [facility] is not responsible for any belongings or valuables.  Contacts, dentures or bridgework may not be worn into surgery.  Leave your suitcase in the car.  After surgery it may be brought to your room.  For patients admitted to the hospital, discharge time will be determined by your treatment team.  Patients discharged the day of surgery will not be allowed to drive home.    [facility]  Before surgery, you can play an important role.  Because skin is not sterile, your skin needs to be as free of germs as possible.  You can reduce the number of germs on you skin by washing with CHG (chlorahexidine gluconate) soap before surgery.  CHG is  an antiseptic cleaner which kills germs and bonds with the skin to continue killing germs even after washing.  Please DO NOT use if you have an allergy to CHG or antibacterial soaps.  If your skin becomes reddened/irritated stop using the CHG and inform your nurse when you arrive at Short Stay.  Do not shave (including legs and underarms) for at least 48 hours prior to the first CHG shower.  You may shave your face.  Please follow these instructions carefully:   1.  Shower with CHG Soap the night before surgery and the                                morning of Surgery.  2.  If you choose to wash your hair, wash your hair first as usual with your       normal shampoo.  3.  After you shampoo, rinse your hair and body thoroughly to remove the                      Shampoo.  4.  Use CHG as you would any other liquid soap.  You can apply chg directly       to the skin and wash gently with scrungie or a clean washcloth.  5.  Apply the CHG Soap to your body ONLY FROM THE  NECK DOWN.        Do not use on open wounds or open sores.  Avoid contact with your eyes,       ears, mouth and genitals (private parts).  Wash genitals (private parts)       with your normal soap.  6.  Wash thoroughly, paying special attention to the area where your surgery        will be performed.  7.  Thoroughly rinse your body with warm water from the neck down.  8.  DO NOT shower/wash with your normal soap after using and rinsing off       the CHG Soap.  9.  Pat yourself dry with a clean towel.            10.  Wear clean pajamas.            11.  Place clean sheets on your bed the night of your first shower and do not        sleep with pets.  Day of Surgery  Do not apply any lotions/deoderants the morning of surgery.  Please wear clean clothes to the hospital/surgery center.    Please read over the following fact sheets that you were given. Pain Booklet, Coughing and Deep Breathing, MRSA Information and Surgical Site  Infection Prevention

## 2016-09-16 LAB — URINE CULTURE: CULTURE: NO GROWTH

## 2016-09-26 MED ORDER — TRANEXAMIC ACID 1000 MG/10ML IV SOLN
2000.0000 mg | INTRAVENOUS | Status: AC
  Administered 2016-09-27: 2000 mg via TOPICAL
  Filled 2016-09-26: qty 20

## 2016-09-27 ENCOUNTER — Encounter (HOSPITAL_COMMUNITY): Payer: Self-pay | Admitting: *Deleted

## 2016-09-27 ENCOUNTER — Inpatient Hospital Stay (HOSPITAL_COMMUNITY): Payer: Medicaid Other | Admitting: Certified Registered Nurse Anesthetist

## 2016-09-27 ENCOUNTER — Encounter (HOSPITAL_COMMUNITY)
Admission: RE | Disposition: A | Discharge status: Skilled Nursing Facility | Payer: Self-pay | Source: Ambulatory Visit | Attending: Orthopedic Surgery

## 2016-09-27 ENCOUNTER — Inpatient Hospital Stay (HOSPITAL_COMMUNITY)
Admission: RE | Admit: 2016-09-27 | Discharge: 2016-09-29 | DRG: 470 | Disposition: A | Discharge status: Skilled Nursing Facility | Payer: Medicaid Other | Source: Ambulatory Visit | Attending: Orthopedic Surgery | Admitting: Orthopedic Surgery

## 2016-09-27 ENCOUNTER — Inpatient Hospital Stay (HOSPITAL_COMMUNITY): Payer: Medicaid Other

## 2016-09-27 DIAGNOSIS — G4700 Insomnia, unspecified: Secondary | ICD-10-CM | POA: Diagnosis present

## 2016-09-27 DIAGNOSIS — Z791 Long term (current) use of non-steroidal anti-inflammatories (NSAID): Secondary | ICD-10-CM

## 2016-09-27 DIAGNOSIS — J449 Chronic obstructive pulmonary disease, unspecified: Secondary | ICD-10-CM | POA: Diagnosis present

## 2016-09-27 DIAGNOSIS — E669 Obesity, unspecified: Secondary | ICD-10-CM | POA: Diagnosis present

## 2016-09-27 DIAGNOSIS — Z6839 Body mass index (BMI) 39.0-39.9, adult: Secondary | ICD-10-CM | POA: Diagnosis not present

## 2016-09-27 DIAGNOSIS — M1712 Unilateral primary osteoarthritis, left knee: Principal | ICD-10-CM | POA: Diagnosis present

## 2016-09-27 DIAGNOSIS — I739 Peripheral vascular disease, unspecified: Secondary | ICD-10-CM | POA: Diagnosis present

## 2016-09-27 DIAGNOSIS — Z86718 Personal history of other venous thrombosis and embolism: Secondary | ICD-10-CM

## 2016-09-27 DIAGNOSIS — R262 Difficulty in walking, not elsewhere classified: Secondary | ICD-10-CM

## 2016-09-27 DIAGNOSIS — Z96641 Presence of right artificial hip joint: Secondary | ICD-10-CM | POA: Diagnosis present

## 2016-09-27 DIAGNOSIS — Z7982 Long term (current) use of aspirin: Secondary | ICD-10-CM

## 2016-09-27 DIAGNOSIS — Z87891 Personal history of nicotine dependence: Secondary | ICD-10-CM | POA: Diagnosis not present

## 2016-09-27 DIAGNOSIS — Z885 Allergy status to narcotic agent status: Secondary | ICD-10-CM | POA: Diagnosis not present

## 2016-09-27 DIAGNOSIS — M25662 Stiffness of left knee, not elsewhere classified: Secondary | ICD-10-CM

## 2016-09-27 DIAGNOSIS — I10 Essential (primary) hypertension: Secondary | ICD-10-CM | POA: Diagnosis present

## 2016-09-27 DIAGNOSIS — Z96612 Presence of left artificial shoulder joint: Secondary | ICD-10-CM | POA: Diagnosis present

## 2016-09-27 DIAGNOSIS — R339 Retention of urine, unspecified: Secondary | ICD-10-CM | POA: Diagnosis not present

## 2016-09-27 DIAGNOSIS — M25562 Pain in left knee: Secondary | ICD-10-CM | POA: Diagnosis present

## 2016-09-27 DIAGNOSIS — Z96652 Presence of left artificial knee joint: Secondary | ICD-10-CM

## 2016-09-27 DIAGNOSIS — Z96659 Presence of unspecified artificial knee joint: Secondary | ICD-10-CM

## 2016-09-27 HISTORY — PX: TOTAL KNEE ARTHROPLASTY: SHX125

## 2016-09-27 SURGERY — ARTHROPLASTY, KNEE, TOTAL
Anesthesia: Monitor Anesthesia Care | Site: Knee | Laterality: Left

## 2016-09-27 MED ORDER — BUPIVACAINE HCL 0.5 % IJ SOLN
INTRAMUSCULAR | Status: DC | PRN
  Administered 2016-09-27: 10 mL

## 2016-09-27 MED ORDER — HYDROMORPHONE HCL 1 MG/ML IJ SOLN
0.2500 mg | INTRAMUSCULAR | Status: DC | PRN
  Administered 2016-09-27: 0.25 mg via INTRAVENOUS
  Administered 2016-09-27 (×2): 0.5 mg via INTRAVENOUS
  Administered 2016-09-27: 0.25 mg via INTRAVENOUS
  Administered 2016-09-27: 0.5 mg via INTRAVENOUS

## 2016-09-27 MED ORDER — TRAZODONE HCL 100 MG PO TABS
100.0000 mg | ORAL_TABLET | Freq: Every day | ORAL | Status: DC
  Administered 2016-09-27 – 2016-09-28 (×2): 100 mg via ORAL
  Filled 2016-09-27 (×2): qty 1

## 2016-09-27 MED ORDER — ACETAMINOPHEN 650 MG RE SUPP: 650.0000 mg | Freq: Four times a day (QID) | RECTAL | Status: DC | PRN

## 2016-09-27 MED ORDER — ONDANSETRON HCL 4 MG/2ML IJ SOLN: 4.0000 mg | Freq: Four times a day (QID) | INTRAMUSCULAR | Status: DC | PRN

## 2016-09-27 MED ORDER — POTASSIUM CHLORIDE IN NACL 20-0.9 MEQ/L-% IV SOLN
INTRAVENOUS | Status: DC
  Administered 2016-09-27: 23:00:00 via INTRAVENOUS
  Filled 2016-09-27: qty 1000

## 2016-09-27 MED ORDER — PROPOFOL 500 MG/50ML IV EMUL
INTRAVENOUS | Status: DC | PRN
  Administered 2016-09-27: 75 ug/kg/min via INTRAVENOUS

## 2016-09-27 MED ORDER — METHOCARBAMOL 500 MG PO TABS
ORAL_TABLET | ORAL | Status: AC
  Filled 2016-09-27: qty 1

## 2016-09-27 MED ORDER — BISACODYL 10 MG RE SUPP: 10.0000 mg | Freq: Every day | RECTAL | Status: DC | PRN

## 2016-09-27 MED ORDER — VERAPAMIL HCL ER 240 MG PO TBCR
240.0000 mg | EXTENDED_RELEASE_TABLET | Freq: Every day | ORAL | Status: DC
  Administered 2016-09-27 – 2016-09-28 (×2): 240 mg via ORAL
  Filled 2016-09-27 (×2): qty 1

## 2016-09-27 MED ORDER — SODIUM CHLORIDE 0.9 % IV SOLN
INTRAVENOUS | Status: DC | PRN
  Administered 2016-09-27: 30 ug/min via INTRAVENOUS

## 2016-09-27 MED ORDER — FENTANYL CITRATE (PF) 100 MCG/2ML IJ SOLN
INTRAMUSCULAR | Status: AC
  Filled 2016-09-27: qty 2

## 2016-09-27 MED ORDER — DEXAMETHASONE SODIUM PHOSPHATE 10 MG/ML IJ SOLN
10.0000 mg | Freq: Once | INTRAMUSCULAR | Status: AC
  Administered 2016-09-28: 10 mg via INTRAVENOUS
  Filled 2016-09-27: qty 1

## 2016-09-27 MED ORDER — 0.9 % SODIUM CHLORIDE (POUR BTL) OPTIME
TOPICAL | Status: DC | PRN
  Administered 2016-09-27: 1000 mL

## 2016-09-27 MED ORDER — APIXABAN 2.5 MG PO TABS: ORAL_TABLET | ORAL | 0 refills | Status: DC

## 2016-09-27 MED ORDER — HYDROMORPHONE HCL 2 MG/ML IJ SOLN
INTRAMUSCULAR | Status: AC
  Filled 2016-09-27: qty 1

## 2016-09-27 MED ORDER — MAGNESIUM CITRATE PO SOLN: 1.0000 | Freq: Once | ORAL | Status: DC | PRN

## 2016-09-27 MED ORDER — FENTANYL CITRATE (PF) 100 MCG/2ML IJ SOLN
INTRAMUSCULAR | Status: DC | PRN
  Administered 2016-09-27: 50 ug via INTRAVENOUS

## 2016-09-27 MED ORDER — FENTANYL CITRATE (PF) 100 MCG/2ML IJ SOLN
INTRAMUSCULAR | Status: AC
  Administered 2016-09-27: 50 ug via INTRAVENOUS
  Filled 2016-09-27: qty 2

## 2016-09-27 MED ORDER — METHOCARBAMOL 500 MG PO TABS
500.0000 mg | ORAL_TABLET | Freq: Four times a day (QID) | ORAL | Status: DC | PRN
  Administered 2016-09-27 – 2016-09-29 (×4): 500 mg via ORAL
  Filled 2016-09-27 (×3): qty 1

## 2016-09-27 MED ORDER — HYDROMORPHONE HCL 2 MG PO TABS
2.0000 mg | ORAL_TABLET | ORAL | Status: DC | PRN
  Administered 2016-09-27 – 2016-09-28 (×3): 4 mg via ORAL
  Filled 2016-09-27 (×3): qty 2

## 2016-09-27 MED ORDER — HYDROMORPHONE HCL 1 MG/ML IJ SOLN
INTRAMUSCULAR | Status: AC
  Filled 2016-09-27: qty 0.5

## 2016-09-27 MED ORDER — DIPHENHYDRAMINE HCL 12.5 MG/5ML PO ELIX
12.5000 mg | ORAL_SOLUTION | ORAL | Status: DC | PRN
  Administered 2016-09-28 (×3): 25 mg via ORAL
  Filled 2016-09-27 (×3): qty 10

## 2016-09-27 MED ORDER — ONDANSETRON HCL 4 MG PO TABS: 4.0000 mg | ORAL_TABLET | Freq: Three times a day (TID) | ORAL | 0 refills | Status: DC | PRN

## 2016-09-27 MED ORDER — ROPIVACAINE HCL 7.5 MG/ML IJ SOLN
INTRAMUSCULAR | Status: DC | PRN
  Administered 2016-09-27: 20 mL via PERINEURAL

## 2016-09-27 MED ORDER — ALBUTEROL SULFATE (2.5 MG/3ML) 0.083% IN NEBU: 2.5000 mg | INHALATION_SOLUTION | Freq: Four times a day (QID) | RESPIRATORY_TRACT | Status: DC | PRN

## 2016-09-27 MED ORDER — MIDAZOLAM HCL 2 MG/2ML IJ SOLN
1.0000 mg | INTRAMUSCULAR | Status: DC | PRN
  Administered 2016-09-27: 1 mg via INTRAVENOUS

## 2016-09-27 MED ORDER — APIXABAN 2.5 MG PO TABS
2.5000 mg | ORAL_TABLET | Freq: Two times a day (BID) | ORAL | Status: DC
  Administered 2016-09-28 – 2016-09-29 (×3): 2.5 mg via ORAL
  Filled 2016-09-27 (×3): qty 1

## 2016-09-27 MED ORDER — MENTHOL 3 MG MT LOZG: 1.0000 | LOZENGE | OROMUCOSAL | Status: DC | PRN

## 2016-09-27 MED ORDER — HYDROMORPHONE HCL 2 MG PO TABS: ORAL_TABLET | ORAL | 0 refills | Status: DC

## 2016-09-27 MED ORDER — ONDANSETRON HCL 4 MG PO TABS
4.0000 mg | ORAL_TABLET | Freq: Four times a day (QID) | ORAL | Status: DC | PRN
  Administered 2016-09-28: 4 mg via ORAL
  Filled 2016-09-27: qty 1

## 2016-09-27 MED ORDER — CELECOXIB 200 MG PO CAPS
200.0000 mg | ORAL_CAPSULE | Freq: Two times a day (BID) | ORAL | Status: DC
  Administered 2016-09-27 – 2016-09-29 (×4): 200 mg via ORAL
  Filled 2016-09-27 (×4): qty 1

## 2016-09-27 MED ORDER — METOCLOPRAMIDE HCL 5 MG PO TABS: 5.0000 mg | ORAL_TABLET | Freq: Three times a day (TID) | ORAL | Status: DC | PRN

## 2016-09-27 MED ORDER — PHENOL 1.4 % MT LIQD: 1.0000 | OROMUCOSAL | Status: DC | PRN

## 2016-09-27 MED ORDER — SODIUM CHLORIDE 0.9 % IJ SOLN
INTRAMUSCULAR | Status: DC | PRN
  Administered 2016-09-27: 10 mL

## 2016-09-27 MED ORDER — MONTELUKAST SODIUM 10 MG PO TABS
10.0000 mg | ORAL_TABLET | Freq: Every day | ORAL | Status: DC
  Administered 2016-09-27 – 2016-09-28 (×2): 10 mg via ORAL
  Filled 2016-09-27 (×2): qty 1

## 2016-09-27 MED ORDER — ALBUTEROL SULFATE (2.5 MG/3ML) 0.083% IN NEBU: 3.0000 mL | INHALATION_SOLUTION | Freq: Four times a day (QID) | RESPIRATORY_TRACT | Status: DC | PRN

## 2016-09-27 MED ORDER — HYDROMORPHONE HCL 2 MG/ML IJ SOLN
0.5000 mg | INTRAMUSCULAR | Status: DC | PRN
  Administered 2016-09-27 – 2016-09-29 (×16): 1 mg via INTRAVENOUS
  Filled 2016-09-27 (×16): qty 1

## 2016-09-27 MED ORDER — ACETAMINOPHEN 325 MG PO TABS
650.0000 mg | ORAL_TABLET | Freq: Four times a day (QID) | ORAL | Status: DC | PRN
  Administered 2016-09-27: 650 mg via ORAL
  Filled 2016-09-27: qty 2

## 2016-09-27 MED ORDER — LACTATED RINGERS IV SOLN
INTRAVENOUS | Status: DC
  Administered 2016-09-27: 11:00:00 via INTRAVENOUS

## 2016-09-27 MED ORDER — VANCOMYCIN HCL IN DEXTROSE 1-5 GM/200ML-% IV SOLN
1000.0000 mg | Freq: Two times a day (BID) | INTRAVENOUS | Status: AC
  Administered 2016-09-27: 1000 mg via INTRAVENOUS
  Filled 2016-09-27: qty 200

## 2016-09-27 MED ORDER — MIDAZOLAM HCL 2 MG/2ML IJ SOLN
INTRAMUSCULAR | Status: AC
  Filled 2016-09-27: qty 2

## 2016-09-27 MED ORDER — FENTANYL CITRATE (PF) 100 MCG/2ML IJ SOLN
50.0000 ug | INTRAMUSCULAR | Status: DC | PRN
  Administered 2016-09-27: 50 ug via INTRAVENOUS

## 2016-09-27 MED ORDER — LACTATED RINGERS IV SOLN
INTRAVENOUS | Status: DC | PRN
  Administered 2016-09-27 (×2): via INTRAVENOUS

## 2016-09-27 MED ORDER — LISINOPRIL 20 MG PO TABS
20.0000 mg | ORAL_TABLET | Freq: Every day | ORAL | Status: DC
  Administered 2016-09-28 – 2016-09-29 (×2): 20 mg via ORAL
  Filled 2016-09-27 (×2): qty 1

## 2016-09-27 MED ORDER — EPHEDRINE SULFATE 50 MG/ML IJ SOLN
INTRAMUSCULAR | Status: DC | PRN
  Administered 2016-09-27: 10 mg via INTRAVENOUS

## 2016-09-27 MED ORDER — ONDANSETRON HCL 4 MG/2ML IJ SOLN
INTRAMUSCULAR | Status: DC | PRN
  Administered 2016-09-27: 4 mg via INTRAVENOUS

## 2016-09-27 MED ORDER — METOCLOPRAMIDE HCL 5 MG/ML IJ SOLN: 5.0000 mg | Freq: Three times a day (TID) | INTRAMUSCULAR | Status: DC | PRN

## 2016-09-27 MED ORDER — ALUM & MAG HYDROXIDE-SIMETH 200-200-20 MG/5ML PO SUSP: 30.0000 mL | ORAL | Status: DC | PRN

## 2016-09-27 MED ORDER — MIDAZOLAM HCL 2 MG/2ML IJ SOLN
INTRAMUSCULAR | Status: AC
  Administered 2016-09-27: 1 mg via INTRAVENOUS
  Filled 2016-09-27: qty 2

## 2016-09-27 MED ORDER — SODIUM CHLORIDE 0.9 % IV SOLN: INTRAVENOUS | Status: DC

## 2016-09-27 MED ORDER — PHENYLEPHRINE HCL 10 MG/ML IJ SOLN
INTRAMUSCULAR | Status: DC | PRN
  Administered 2016-09-27 (×2): 120 ug via INTRAVENOUS
  Administered 2016-09-27: 40 ug via INTRAVENOUS
  Administered 2016-09-27: 120 ug via INTRAVENOUS

## 2016-09-27 MED ORDER — DOCUSATE SODIUM 100 MG PO CAPS
100.0000 mg | ORAL_CAPSULE | Freq: Two times a day (BID) | ORAL | Status: DC
  Administered 2016-09-27 – 2016-09-29 (×4): 100 mg via ORAL
  Filled 2016-09-27 (×4): qty 1

## 2016-09-27 MED ORDER — BUPIVACAINE IN DEXTROSE 0.75-8.25 % IT SOLN
INTRATHECAL | Status: DC | PRN
  Administered 2016-09-27: 2 mL via INTRATHECAL

## 2016-09-27 MED ORDER — VANCOMYCIN HCL IN DEXTROSE 1-5 GM/200ML-% IV SOLN
1000.0000 mg | INTRAVENOUS | Status: AC
  Administered 2016-09-27: 1000 mg via INTRAVENOUS
  Filled 2016-09-27: qty 200

## 2016-09-27 MED ORDER — BUPIVACAINE LIPOSOME 1.3 % IJ SUSP
20.0000 mL | INTRAMUSCULAR | Status: AC
  Administered 2016-09-27: 20 mL
  Filled 2016-09-27: qty 20

## 2016-09-27 MED ORDER — ALBUTEROL SULFATE (2.5 MG/3ML) 0.083% IN NEBU
INHALATION_SOLUTION | RESPIRATORY_TRACT | Status: AC
  Administered 2016-09-27: 2.5 mg
  Filled 2016-09-27: qty 3

## 2016-09-27 MED ORDER — PROPOFOL 1000 MG/100ML IV EMUL
INTRAVENOUS | Status: AC
  Filled 2016-09-27: qty 200

## 2016-09-27 MED ORDER — ZOLPIDEM TARTRATE 5 MG PO TABS
5.0000 mg | ORAL_TABLET | Freq: Every evening | ORAL | Status: DC | PRN
  Filled 2016-09-27: qty 1

## 2016-09-27 MED ORDER — BUPIVACAINE HCL (PF) 0.5 % IJ SOLN
INTRAMUSCULAR | Status: AC
  Filled 2016-09-27: qty 10

## 2016-09-27 MED ORDER — FLUTICASONE FUROATE-VILANTEROL 200-25 MCG/INH IN AEPB
1.0000 | INHALATION_SPRAY | Freq: Every day | RESPIRATORY_TRACT | Status: DC
  Administered 2016-09-28 – 2016-09-29 (×2): 1 via RESPIRATORY_TRACT
  Filled 2016-09-27: qty 28

## 2016-09-27 MED ORDER — SODIUM CHLORIDE 0.9 % IR SOLN
Status: DC | PRN
  Administered 2016-09-27: 3000 mL

## 2016-09-27 MED ORDER — MIDAZOLAM HCL 5 MG/5ML IJ SOLN
INTRAMUSCULAR | Status: DC | PRN
Start: 2016-09-27 — End: 2016-09-27
  Administered 2016-09-27: 2 mg via INTRAVENOUS

## 2016-09-27 MED ORDER — METHOCARBAMOL 1000 MG/10ML IJ SOLN
500.0000 mg | Freq: Four times a day (QID) | INTRAVENOUS | Status: DC | PRN
  Filled 2016-09-27: qty 5

## 2016-09-27 MED ORDER — POLYETHYLENE GLYCOL 3350 17 G PO PACK: 17.0000 g | PACK | Freq: Every day | ORAL | Status: DC | PRN

## 2016-09-27 SURGICAL SUPPLY — 72 items
APL SKNCLS STERI-STRIP NONHPOA (GAUZE/BANDAGES/DRESSINGS) ×1
BANDAGE ACE 4X5 VEL STRL LF (GAUZE/BANDAGES/DRESSINGS) ×3 IMPLANT
BANDAGE ACE 6X5 VEL STRL LF (GAUZE/BANDAGES/DRESSINGS) ×3 IMPLANT
BANDAGE ESMARK 6X9 LF (GAUZE/BANDAGES/DRESSINGS) ×1 IMPLANT
BENZOIN TINCTURE PRP APPL 2/3 (GAUZE/BANDAGES/DRESSINGS) ×3 IMPLANT
BLADE SAG 18X100X1.27 (BLADE) ×6 IMPLANT
BNDG CMPR 9X6 STRL LF SNTH (GAUZE/BANDAGES/DRESSINGS) ×1
BNDG ESMARK 6X9 LF (GAUZE/BANDAGES/DRESSINGS) ×3
BOWL SMART MIX CTS (DISPOSABLE) ×3 IMPLANT
CAPT KNEE TOTAL 3 ×2 IMPLANT
CEMENT BONE SIMPLEX SPEEDSET (Cement) ×6 IMPLANT
CLOSURE STERI-STRIP 1/2X4 (GAUZE/BANDAGES/DRESSINGS) ×1
CLOSURE WOUND 1/2 X4 (GAUZE/BANDAGES/DRESSINGS) ×2
CLSR STERI-STRIP ANTIMIC 1/2X4 (GAUZE/BANDAGES/DRESSINGS) ×1 IMPLANT
COVER SURGICAL LIGHT HANDLE (MISCELLANEOUS) ×3 IMPLANT
CUFF TOURNIQUET SINGLE 34IN LL (TOURNIQUET CUFF) ×3 IMPLANT
DECANTER SPIKE VIAL GLASS SM (MISCELLANEOUS) ×2 IMPLANT
DRAPE EXTREMITY T 121X128X90 (DRAPE) ×2 IMPLANT
DRAPE HALF SHEET 40X57 (DRAPES) ×3 IMPLANT
DRAPE IMP U-DRAPE 54X76 (DRAPES) ×3 IMPLANT
DRAPE PROXIMA HALF (DRAPES) ×5 IMPLANT
DRAPE U-SHAPE 47X51 STRL (DRAPES) ×3 IMPLANT
DRSG AQUACEL AG ADV 3.5X10 (GAUZE/BANDAGES/DRESSINGS) ×3 IMPLANT
DURAPREP 26ML APPLICATOR (WOUND CARE) ×4 IMPLANT
ELECT CAUTERY BLADE 6.4 (BLADE) ×3 IMPLANT
ELECT REM PT RETURN 9FT ADLT (ELECTROSURGICAL) ×3
ELECTRODE REM PT RTRN 9FT ADLT (ELECTROSURGICAL) ×1 IMPLANT
EVACUATOR 1/8 PVC DRAIN (DRAIN) IMPLANT
FACESHIELD WRAPAROUND (MASK) ×6 IMPLANT
FACESHIELD WRAPAROUND OR TEAM (MASK) ×2 IMPLANT
GLOVE BIOGEL PI IND STRL 7.0 (GLOVE) ×1 IMPLANT
GLOVE BIOGEL PI INDICATOR 7.0 (GLOVE) ×2
GLOVE ORTHO TXT STRL SZ7.5 (GLOVE) ×3 IMPLANT
GLOVE SURG ORTHO 7.0 STRL STRW (GLOVE) ×3 IMPLANT
GOWN STRL REUS W/ TWL LRG LVL3 (GOWN DISPOSABLE) ×2 IMPLANT
GOWN STRL REUS W/ TWL XL LVL3 (GOWN DISPOSABLE) ×1 IMPLANT
GOWN STRL REUS W/TWL LRG LVL3 (GOWN DISPOSABLE) ×6
GOWN STRL REUS W/TWL XL LVL3 (GOWN DISPOSABLE) ×3
HANDPIECE INTERPULSE COAX TIP (DISPOSABLE) ×3
IMMOBILIZER KNEE 20 (SOFTGOODS) ×3
IMMOBILIZER KNEE 20 THIGH 36 (SOFTGOODS) IMPLANT
IMMOBILIZER KNEE 22 UNIV (SOFTGOODS) ×3 IMPLANT
IMMOBILIZER KNEE 24 THIGH 36 (MISCELLANEOUS) IMPLANT
IMMOBILIZER KNEE 24 UNIV (MISCELLANEOUS)
KIT BASIN OR (CUSTOM PROCEDURE TRAY) ×3 IMPLANT
KIT ROOM TURNOVER OR (KITS) ×3 IMPLANT
MANIFOLD NEPTUNE II (INSTRUMENTS) ×3 IMPLANT
NDL 18GX1X1/2 (RX/OR ONLY) (NEEDLE) ×1 IMPLANT
NDL HYPO 25GX1X1/2 BEV (NEEDLE) ×1 IMPLANT
NEEDLE 18GX1X1/2 (RX/OR ONLY) (NEEDLE) ×3 IMPLANT
NEEDLE HYPO 25GX1X1/2 BEV (NEEDLE) ×3 IMPLANT
NS IRRIG 1000ML POUR BTL (IV SOLUTION) ×3 IMPLANT
PACK TOTAL JOINT (CUSTOM PROCEDURE TRAY) ×3 IMPLANT
PACK UNIVERSAL I (CUSTOM PROCEDURE TRAY) ×1 IMPLANT
PAD ARMBOARD 7.5X6 YLW CONV (MISCELLANEOUS) ×6 IMPLANT
SET HNDPC FAN SPRY TIP SCT (DISPOSABLE) ×1 IMPLANT
STRIP CLOSURE SKIN 1/2X4 (GAUZE/BANDAGES/DRESSINGS) ×4 IMPLANT
SUCTION FRAZIER HANDLE 10FR (MISCELLANEOUS) ×2
SUCTION TUBE FRAZIER 10FR DISP (MISCELLANEOUS) ×1 IMPLANT
SUT MNCRL AB 4-0 PS2 18 (SUTURE) ×3 IMPLANT
SUT VIC AB 0 CT1 27 (SUTURE)
SUT VIC AB 0 CT1 27XBRD ANBCTR (SUTURE) IMPLANT
SUT VIC AB 1 CTX 36 (SUTURE) ×3
SUT VIC AB 1 CTX36XBRD ANBCTR (SUTURE) ×1 IMPLANT
SUT VIC AB 2-0 CT1 27 (SUTURE) ×6
SUT VIC AB 2-0 CT1 TAPERPNT 27 (SUTURE) ×2 IMPLANT
SYR 50ML LL SCALE MARK (SYRINGE) ×3 IMPLANT
SYR CONTROL 10ML LL (SYRINGE) ×3 IMPLANT
TOWEL OR 17X24 6PK STRL BLUE (TOWEL DISPOSABLE) ×3 IMPLANT
TOWEL OR 17X26 10 PK STRL BLUE (TOWEL DISPOSABLE) ×3 IMPLANT
TRAY CATH 16FR W/PLASTIC CATH (SET/KITS/TRAYS/PACK) IMPLANT
WATER STERILE IRR 1000ML POUR (IV SOLUTION) ×1 IMPLANT

## 2016-09-27 NOTE — Anesthesia Preprocedure Evaluation (Signed)
Anesthesia Evaluation  Patient identified by MRN, date of birth, ID band Patient awake    Reviewed: Allergy & Precautions, NPO status , Patient's Chart, lab work & pertinent test results  Airway Mallampati: II  TM Distance: >3 FB Neck ROM: Full    Dental  (+) Teeth Intact, Dental Advisory Given   Pulmonary asthma , COPD, former smoker,    Pulmonary exam normal breath sounds clear to auscultation       Cardiovascular hypertension, Pt. on medications + Peripheral Vascular Disease and + DVT  Normal cardiovascular exam Rhythm:Regular Rate:Normal     Neuro/Psych Seizures -, Well Controlled,  PSYCHIATRIC DISORDERS Depression    GI/Hepatic negative GI ROS, Neg liver ROS,   Endo/Other  negative endocrine ROS  Renal/GU negative Renal ROS     Musculoskeletal  (+) Arthritis , Osteoarthritis,    Abdominal   Peds  Hematology negative hematology ROS (+) Plt 197k   Anesthesia Other Findings Day of surgery medications reviewed with the patient.  Reproductive/Obstetrics                             Anesthesia Physical Anesthesia Plan  ASA: II  Anesthesia Plan: Spinal, Regional and MAC   Post-op Pain Management:  Regional for Post-op pain   Induction:   Airway Management Planned:   Additional Equipment:   Intra-op Plan:   Post-operative Plan:   Informed Consent: I have reviewed the patients History and Physical, chart, labs and discussed the procedure including the risks, benefits and alternatives for the proposed anesthesia with the patient or authorized representative who has indicated his/her understanding and acceptance.   Dental advisory given  Plan Discussed with: CRNA, Anesthesiologist and Surgeon  Anesthesia Plan Comments: (Discussed risks and benefits of and differences between spinal and general. Discussed risks of spinal including headache, backache, failure, bleeding, infection,  and nerve damage. Patient consents to spinal. Questions answered. Coagulation studies and platelet count acceptable.  Discussed risks and benefits of adductor canal block including failure, bleeding, infection, nerve damage, weakness. Discussed that the block may not prevent all of the pain in the knee. Questions answered. Patient consents to block. )        Anesthesia Quick Evaluation

## 2016-09-27 NOTE — Transfer of Care (Signed)
Immediate Anesthesia Transfer of Care Note  Patient: Amanda Christensen  Procedure(s) Performed: Procedure(s): LEFT TOTAL KNEE ARTHROPLASTY (Left)  Patient Location: PACU  Anesthesia Type:MAC, Regional and Spinal  Level of Consciousness: awake, alert  and oriented  Airway & Oxygen Therapy: Patient connected to nasal cannula oxygen  Post-op Assessment: Report given to RN and Post -op Vital signs reviewed and stable  Post vital signs: Reviewed and stable  Last Vitals:  Vitals:   09/27/16 1100 09/27/16 1430  BP: 119/73   Pulse: (!) 58   Resp: 14   Temp:  (P) 36.7 C    Last Pain:  Vitals:   09/27/16 1013  TempSrc:   PainSc: 8       Patients Stated Pain Goal: 3 ([id number])  Complications: No apparent anesthesia complications

## 2016-09-27 NOTE — Interval H&P Note (Signed)
History and Physical Interval Note:  09/27/2016 10:52 AM  Amanda Christensen  has presented today for surgery, with the diagnosis of djd left knee  The various methods of treatment have been discussed with the patient and family. After consideration of risks, benefits and other options for treatment, the patient has consented to  Procedure(s): LEFT TOTAL KNEE ARTHROPLASTY (Left) as a surgical intervention .  The patient's history has been reviewed, patient examined, no change in status, stable for surgery.  I have reviewed the patient's chart and labs.  Questions were answered to the patient's satisfaction.     [name]

## 2016-09-27 NOTE — Discharge Summary (Addendum)
Patient ID: Amanda Christensen MRN: [id number] DOB/AGE: February 18, 1961 55 y.o.  Admit date: 09/27/2016 Discharge date: 09/29/2016  Admission Diagnoses:  Active Problems:   Total knee replacement status, left   Discharge Diagnoses:  Same  Past Medical History:  Diagnosis Date  . Arthritis   . Asthma   . Chronic pain   . COPD (chronic obstructive pulmonary disease) ([place])   . Depression    history of   . 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 Heart murmur   . History of anxiety    "used to have anxiety attacks"  . History of bronchitis   . Hypertension    takes Verapamil and Lisinopril  daily  . Insomnia    takes Trazodone nightly  . Obesity   . Peripheral vascular disease ([facility]) 10   rt leg dvt   . Seizures ([place]) 03   was on medications none now off  since 2011  . Shortness of breath dyspnea     Surgeries: Procedure(s): LEFT TOTAL KNEE ARTHROPLASTY on 09/27/2016   Consultants:   Discharged Condition: Improved  Hospital Course: Amanda Christensen is an 55 y.o. female who was admitted 09/27/2016 for operative treatment of primary localized osteoarthritis left knee. Patient has severe unremitting pain that affects sleep, daily activities, and work/hobbies. After pre-op clearance the patient was taken to the operating room on 09/27/2016 and underwent  Procedure(s): LEFT TOTAL KNEE ARTHROPLASTY.    Patient was given perioperative antibiotics:  Anti-infectives    Start     Dose/Rate Route Frequency Ordered Stop   09/27/16 2330  vancomycin (VANCOCIN) IVPB 1000 mg/200 mL premix     1,000 mg 200 mL/hr over 60 Minutes Intravenous Every 12 hours 09/27/16 1840 09/28/16 0026   09/27/16 1145  vancomycin (VANCOCIN) IVPB 1000 mg/200 mL premix     1,000 mg 200 mL/hr over 60 Minutes Intravenous To Surgery 09/27/16 1134 09/27/16 1140       Patient was given sequential compression devices, early ambulation, and chemoprophylaxis to prevent  DVT.  Patient benefited maximally from hospital stay and there were no complications.    Recent vital signs:  Patient Vitals for the past 24 hrs:  BP Temp Temp src Pulse Resp SpO2  09/29/16 0922 - - - - - 98 %  09/29/16 0612 126/68 98.6 F (37 C) Oral 75 18 98 %  09/28/16 2103 (!) 103/53 98.9 F (37.2 C) Oral 66 18 98 %  09/28/16 1356 113/69 97.2 F (36.2 C) Oral 62 - 100 %     Recent laboratory studies:   Recent Labs  09/28/16 0533 09/29/16 0204  WBC 6.8 9.4  HGB 12.6 12.1  HCT 39.6 36.6  PLT 174 167  NA 137 138  K 3.6 3.9  CL 105 106  CO2 28 26  BUN 7 7  CREATININE 0.83 0.69  GLUCOSE 123* 137*  CALCIUM 8.9 9.2     Discharge Medications:     Medication List    STOP taking these medications   acetaminophen 500 MG tablet Commonly known as:  TYLENOL   aspirin EC 81 MG tablet   HYDROcodone-acetaminophen 5-325 MG tablet Commonly known as:  NORCO/VICODIN   ibuprofen 200 MG tablet Commonly known as:  ADVIL,MOTRIN   meloxicam 15 MG tablet Commonly known as:  MOBIC   traMADol 50 MG tablet Commonly known as:  ULTRAM     TAKE these medications   albuterol 108 (90 Base)  MCG/ACT inhaler Commonly known as:  PROVENTIL HFA;VENTOLIN HFA Inhale 1-2 puffs into the lungs every 6 (six) hours as needed for wheezing or shortness of breath.   apixaban 2.5 MG Tabs tablet Commonly known as:  ELIQUIS Take 1 tab po q12 hours x 14 days following surgery to prevent blood clots   cetirizine 10 MG tablet Commonly known as:  ZYRTEC Take 10 mg by mouth at bedtime.   diclofenac sodium 1 % Gel Commonly known as:  VOLTAREN Apply 2 g topically 4 (four) times daily.   docusate sodium 100 MG capsule Commonly known as:  COLACE Take 1 capsule (100 mg total) by mouth 2 (two) times daily.   fluticasone 50 MCG/ACT nasal spray Commonly known as:  FLONASE Place 1 spray into both nostrils daily as needed for allergies or rhinitis.   Fluticasone-Salmeterol 250-50 MCG/DOSE  Aepb Commonly known as:  ADVAIR Inhale 1 puff into the lungs 2 (two) times daily.   HYDROmorphone 2 MG tablet Commonly known as:  DILAUDID Take 1-2 tabs po q4-6 hours prn pain   lisinopril 20 MG tablet Commonly known as:  PRINIVIL,ZESTRIL Take 20 mg by mouth daily.   methocarbamol 500 MG tablet Commonly known as:  ROBAXIN Take 1 tablet (500 mg total) by mouth 3 (three) times daily as needed.   montelukast 10 MG tablet Commonly known as:  SINGULAIR Take 10 mg by mouth at bedtime.   ondansetron 4 MG tablet Commonly known as:  ZOFRAN Take 1 tablet (4 mg total) by mouth every 8 (eight) hours as needed for nausea or vomiting.   REFRESH DRY EYE THERAPY OP Apply 1 drop to eye daily as needed (dry eyes).   RESTASIS MULTIDOSE 0.05 % ophthalmic emulsion Generic drug:  cycloSPORINE Place 1 drop into both eyes daily.   traZODone 100 MG tablet Commonly known as:  DESYREL Take 100 mg by mouth at bedtime.   triamcinolone cream 0.1 % Commonly known as:  KENALOG Apply 1 application topically 2 (two) times daily.   verapamil 240 MG 24 hr capsule Commonly known as:  VERELAN PM Take 240 mg by mouth at bedtime.   VITAMIN C PO Take 1 tablet by mouth at bedtime.   VITAMIN D PO Take 1 tablet by mouth at bedtime.       Diagnostic Studies: Dg Chest 2 View  Result Date: 09/15/2016 CLINICAL DATA:  Pre-op chest Total lt knee 09/2016 Hypertension Hx of bronchitis and seizures COPD/uses breathing machine at home X smoker /8 yrs/ smoked 1 ppd 20 yrs asthma Arthritis Heart murmur Shortness of breath upon exertion EXAM: CHEST  2 VIEW COMPARISON:  12/15/2014 FINDINGS: Heart size is normal. Lungs are clear. No pulmonary edema. Status post left shoulder arthroplasty. IMPRESSION: No evidence for acute cardiopulmonary abnormality. Electronically Signed   By: Nolon Nations M.D.   On: 09/15/2016 12:54   Mm Breast Surgical Specimen  Result Date: 09/13/2016 CLINICAL DATA:  Post surgical excision  of left breast lesion. EXAM: SPECIMEN RADIOGRAPH OF THE LEFT BREAST COMPARISON:  Previous exam(s). FINDINGS: Status post excision of the left breast. The wire tip and biopsy marker clip are present and are marked for pathology. IMPRESSION: Specimen radiograph of the left breast. Electronically Signed   By: Altamese Cabal M.D.   On: 09/13/2016 12:57   Dg Knee Left Port  Result Date: 09/27/2016 CLINICAL DATA:  Left knee replacement. EXAM: PORTABLE LEFT KNEE - 1-2 VIEW COMPARISON:  10/18/2013 . FINDINGS: Total left knee replacement.  Anatomic alignment.  Hardware  intact. IMPRESSION: Total left knee replacement.  Anatomic alignment. Electronically Signed   By: Marcello Moores  Register   On: 09/27/2016 15:09   Mm Lt Plc Breast Loc Dev   1st Lesion  Inc Mammo Guide  Result Date: 09/13/2016 CLINICAL DATA:  Preoperative localization for surgical excision of left breast lesion. EXAM: NEEDLE LOCALIZATION OF THE LEFT BREAST WITH MAMMO GUIDANCE COMPARISON:  Previous exams. FINDINGS: Patient presents for needle localization prior to surgical excision of left breast lesion. I met with the patient and we discussed the procedure of needle localization including benefits and alternatives. We discussed the high likelihood of a successful procedure. We discussed the risks of the procedure, including infection, bleeding, tissue injury, and further surgery. Informed, written consent was given. The usual time-out protocol was performed immediately prior to the procedure. Using mammographic guidance, sterile technique, 1% lidocaine and a 7 cm modified Kopans needle, the clip was localized using lateral approach. The images were marked for Dr. Kae Heller. IMPRESSION: Needle localization left breast. No apparent complications. Electronically Signed   By: Altamese Cabal M.D.   On: 09/13/2016 12:56    Disposition: 01-Home or Self Care    Follow-up Information    [name], MD. Schedule an appointment as soon as possible for a  visit in 2 week(s).   Specialty:  Orthopedic Surgery Contact information: [address] [address] [phone]            Signed: Fannie Knee 09/29/2016, 10:06 AM

## 2016-09-27 NOTE — Anesthesia Procedure Notes (Signed)
Spinal  Patient location during procedure: OR Staffing Anesthesiologist: Catalina Gravel Performed: anesthesiologist  Preanesthetic Checklist Completed: patient identified, surgical consent, pre-op evaluation, timeout performed, IV checked, risks and benefits discussed and monitors and equipment checked Spinal Block Patient position: sitting Prep: site prepped and draped and DuraPrep Patient monitoring: continuous pulse ox and blood pressure Approach: midline Location: L3-4 Injection technique: single-shot Needle Needle type: Pencan  Assessment Sensory level: T8 Additional Notes Functioning IV was confirmed and monitors were applied. Sterile prep and drape, including hand hygiene, mask and sterile gloves were used. The patient was positioned and the spine was prepped. The skin was anesthetized with lidocaine.  Free flow of clear CSF was obtained prior to injecting local anesthetic into the CSF.  The spinal needle aspirated freely following injection.  The needle was carefully withdrawn.  The patient tolerated the procedure well. Consent was obtained prior to procedure with all questions answered and concerns addressed. Risks including but not limited to bleeding, infection, nerve damage, paralysis, failed block, inadequate analgesia, allergic reaction, high spinal, itching and headache were discussed and the patient wished to proceed.   Hoy Morn, MD

## 2016-09-27 NOTE — Anesthesia Procedure Notes (Addendum)
Anesthesia Regional Block:  Adductor canal block  Pre-Anesthetic Checklist: ,, timeout performed, Correct Patient, Correct Site, Correct Laterality, Correct Procedure, Correct Position, site marked, Risks and benefits discussed,  Surgical consent,  Pre-op evaluation,  At surgeon's request and post-op pain management  Laterality: Left  Prep: chloraprep       Needles:  Injection technique: Single-shot  Needle Type: Echogenic Needle     Needle Length: 9cm 9 cm Needle Gauge: 21 and 21 G    Additional Needles:  Procedures: ultrasound guided (picture in chart) Adductor canal block Narrative:  Start time: 09/27/2016 12:25 PM End time: 09/27/2016 12:30 PM Injection made incrementally with aspirations every 5 mL.  Performed by: Personally  Anesthesiologist: Catalina Gravel  Additional Notes: No pain on injection. No increased resistance to injection. Injection made in 5cc increments.  Good needle visualization.  Patient tolerated procedure well.

## 2016-09-27 NOTE — Anesthesia Postprocedure Evaluation (Signed)
Anesthesia Post Note  Patient: Amanda Christensen  Procedure(s) Performed: Procedure(s) (LRB): LEFT TOTAL KNEE ARTHROPLASTY (Left)  Patient location during evaluation: PACU Anesthesia Type: Spinal and Regional Level of consciousness: awake Pain management: pain level controlled Vital Signs Assessment: post-procedure vital signs reviewed and stable Respiratory status: spontaneous breathing Cardiovascular status: stable Postop Assessment: no signs of nausea or vomiting and spinal receding Anesthetic complications: no    Last Vitals:  Vitals:   09/27/16 1800 09/27/16 1833  BP:    Pulse: 73 74  Resp: 17   Temp: 36.7 C 36.7 C    Last Pain:  Vitals:   09/27/16 1807  TempSrc:   PainSc: 4                  Orvis Stann

## 2016-09-27 NOTE — Progress Notes (Signed)
Orthopedic Tech Progress Note Patient Details:  Amanda Christensen 19-Feb-1961 [id number]  CPM Left Knee CPM Left Knee: On Left Knee Flexion (Degrees): 90 Left Knee Extension (Degrees): 0 Additional Comments: trapeze bar patient helper   Hildred Priest 09/27/2016, 3:48 PM viewed order from doctor's order list

## 2016-09-28 ENCOUNTER — Encounter (HOSPITAL_COMMUNITY): Payer: Self-pay | Admitting: *Deleted

## 2016-09-28 LAB — BASIC METABOLIC PANEL
ANION GAP: 4 — AB (ref 5–15)
BUN: 7 mg/dL (ref 6–20)
CALCIUM: 8.9 mg/dL (ref 8.9–10.3)
CO2: 28 mmol/L (ref 22–32)
CREATININE: 0.83 mg/dL (ref 0.44–1.00)
Chloride: 105 mmol/L (ref 101–111)
Glucose, Bld: 123 mg/dL — ABNORMAL HIGH (ref 65–99)
Potassium: 3.6 mmol/L (ref 3.5–5.1)
SODIUM: 137 mmol/L (ref 135–145)

## 2016-09-28 LAB — CBC
HCT: 39.6 % (ref 36.0–46.0)
Hemoglobin: 12.6 g/dL (ref 12.0–15.0)
MCH: 28.1 pg (ref 26.0–34.0)
MCHC: 31.8 g/dL (ref 30.0–36.0)
MCV: 88.4 fL (ref 78.0–100.0)
Platelets: 174 10*3/uL (ref 150–400)
RBC: 4.48 MIL/uL (ref 3.87–5.11)
RDW: 12.9 % (ref 11.5–15.5)
WBC: 6.8 10*3/uL (ref 4.0–10.5)

## 2016-09-28 MED ORDER — HYDROMORPHONE HCL 2 MG PO TABS: 2.0000 mg | ORAL_TABLET | ORAL | Status: DC | PRN

## 2016-09-28 MED ORDER — TAMSULOSIN HCL 0.4 MG PO CAPS
0.4000 mg | ORAL_CAPSULE | Freq: Every day | ORAL | Status: DC
  Administered 2016-09-28 – 2016-09-29 (×2): 0.4 mg via ORAL
  Filled 2016-09-28 (×2): qty 1

## 2016-09-28 MED ORDER — OXYCODONE HCL 5 MG PO TABS: 5.0000 mg | ORAL_TABLET | ORAL | Status: DC | PRN

## 2016-09-28 NOTE — Op Note (Signed)
NAMEDANELI, MUSACCHIO NO.:  [id number]  MEDICAL RECORD NO.:  [id number]  LOCATION:  5N06C                        FACILITY:  [facility]  PHYSICIAN:  [name], M.D. DATE OF BIRTH:  [birth year]  DATE OF PROCEDURE:  09/27/2016 DATE OF DISCHARGE:                              OPERATIVE REPORT   PREOPERATIVE DIAGNOSIS:  Left knee end-stage arthritis, primary generalized.  POSTOPERATIVE DIAGNOSES: 1. Left knee end-stage arthritis, primary generalized. 2. Exogenous obesity.  PROCEDURE: 1. Left knee modified minimally invasive total knee replacement,     Stryker triathlon prosthesis.  A cemented pegged cruciate     retaining. 2. Femoral component.  Cemented #2 tibial component, 11 mm CS insert.     Cemented resurfacing 29-mm patellar component.  ASSISTANT:  Elmyra Ricks, P.A., present throughout the entire case and necessary for timely completion of procedure.  ANESTHESIA:  General.  BLOOD LOSS:  Minimal.  SPECIMENS:  None.  COMPLICATIONS:  None.  DRESSING:  Soft compressive knee immobilizer.  TOURNIQUET TIME:  50 minutes.  PROCEDURE:  The patient was brought to operating room and after adequate anesthesia had been obtained, tourniquet applied.  Prepped and draped in usual sterile fashion.  Exsanguinated with elevation of Esmarch. Tourniquet inflated to 350 mmHg.  Straight incision above the patella down to tibial tubercle.  Medial arthrotomy, vastus splitting. Intramedullary guide, distal femur.  8 mm resection 5 degrees of valgus. Using epicondylar axis, the femur was sized, cut, and fitted for a cruciate retaining pegged #2 component.  Proximal tibial resection with extramedullary guide.  Size #2 component, rotation set with trials.  She already had recurvatum preop.  I utilized an 11 mm CS insert. Resurfacing of patella after 9 mm resection with 29 mm component.  At completion, I was very pleased by mechanical axis and patellar  tracking, stability, absolutely full motion.  Trials had been removed.  Copious irrigation with a pulse irrigating device.  Cement prepared, placed on all components, firmly seated.  Polyethylene attached to tibia, knee reduced.  Patella held with a clamp.  Once the cement hardened, the knee was irrigated again.  Soft tissue was injected with Exparel.  Arthrotomy closed #1 Vicryl with a subcutaneous subcuticular closure.  Margins were injected with Marcaine.  Sterile compressive dressing applied.  Tourniquet deflated and removed.  Knee immobilizer applied.  Anesthesia reversed.  Brought to the recovery room.  Tolerated the surgery well.  No complications.     [name], M.D.     DFM/MEDQ  D:  09/27/2016  T:  09/28/2016  Job:  [id number]

## 2016-09-28 NOTE — Progress Notes (Signed)
09/28/16 1701  PT Visit Information  Last PT Received On 09/28/16  Assistance Needed +1  History of Present Illness 55 y/o female presenting post-op Left Total knee arthroplasty. Pt has a past medical history of Arthritis; Asthma; Chronic pain; COPD ; Depression; DVT ; Heart murmur; anxiety; Hypertension; Insomnia; Obesity; Peripheral vascular disease ; Seizures; and Shortness of breath dyspnea. Had total Right knee surgery in 2012, and Left total shoulder in 2016.   Subjective Data  Subjective Pt reports pain management has been difficult.  She still feels she needs IV meds every 2 hours.   Patient Stated Goal to go to rehab before going home  Precautions  Precautions Knee  Precaution Booklet Issued Yes (comment)  Precaution Comments reviewed knee precaution  Required Braces or Orthoses Knee Immobilizer - Left  Restrictions  LLE Weight Bearing WBAT  Pain Assessment  Pain Assessment 0-10  Pain Score 8  Pain Location left knee  Pain Descriptors / Indicators Aching;Burning  Pain Intervention(s) Monitored during session;Limited activity within patient's tolerance;Repositioned;Ice applied  Cognition  Arousal/Alertness Awake/alert  Behavior During Therapy WFL for tasks assessed/performed  Overall Cognitive Status Within Functional Limits for tasks assessed  Bed Mobility  Overal bed mobility Needs Assistance  Bed Mobility Supine to Sit;Sit to Supine  Supine to sit Min assist  Sit to supine Min assist  General bed mobility comments Min assist to help progress LE EOB and lift it back into the bed to go to supine.   Transfers  Overall transfer level Needs assistance  Equipment used Rolling walker (2 wheeled)  Transfers Sit to/from Stand  Sit to Stand Min assist  General transfer comment Min assist to support trunk and stabilize RW during transitions to stand.   Ambulation/Gait  Ambulation/Gait assistance Min guard  Ambulation Distance (Feet) 65 Feet  Assistive device Rolling walker  (2 wheeled)  Gait Pattern/deviations Step-to pattern;Antalgic  General Gait Details Pt with much more confident steps this PM, better use of RW and less easily fatigued.    Gait velocity decreased  Gait velocity interpretation Below normal speed for age/gender  Balance  Overall balance assessment Needs assistance  Sitting-balance support Feet supported;No upper extremity supported  Sitting balance-Leahy Scale Good  Standing balance support Bilateral upper extremity supported  Standing balance-Leahy Scale Fair  Exercises  Exercises Total Joint  Total Joint Exercises  Short Arc Quad AROM;Left;10 reps  Hip ABduction/ADduction AAROM;Left;10 reps  Straight Leg Raises AAROM;Left;10 reps  PT - End of Session  Equipment Utilized During Treatment Gait belt;Left knee immobilizer  Activity Tolerance No increased pain  Patient left in bed;in CPM;with call bell/phone within reach;with family/visitor present  PT - Assessment/Plan  PT Plan Current plan remains appropriate  PT Frequency (ACUTE ONLY) 7X/week  Follow Up Recommendations SNF  PT equipment 3in1 (PT);Rolling walker with 5" wheels;Other (comment) (wide for both)  PT Goal Progression  Progress towards PT goals Progressing toward goals  PT Time Calculation  PT Start Time (ACUTE ONLY) 1630  PT Stop Time (ACUTE ONLY) 1659  PT Time Calculation (min) (ACUTE ONLY) 29 min  PT General Charges  $$ ACUTE PT VISIT 1 Procedure  PT Treatments  $Gait Training 8-22 mins  $Therapeutic Exercise 8-22 mins  A: Pt is progressing well with gait this PM.  More confident steps and was able to go further down the hallway.  She was able to progress HEP and increased ROM on CPM (left in CPM at the end of the session).  PT will continue to follow  acutely.  Barbarann Ehlers [place], [place], DPT [phone]

## 2016-09-28 NOTE — Care Management Note (Signed)
Case Management Note  Patient Details  Name: Amanda Christensen MRN: [id number] Date of Birth: 1961/06/27  Subjective/Objective:    55 yr old female s/p left total knee arthroplasty.                Action/Plan: Case manager spoke with patient concerning discharge plans. Patient states she will be going to SNF for shortterm rehab, wants [facility]. CM provided this information to Education officer, museum, will continue to follow.    Expected Discharge Date:   09/28/16               Expected Discharge Plan:  [place]  In-House Referral:  Clinical Social Work  Discharge planning Services  CM Consult  Post Acute Care Choice:  NA Choice offered to:  Patient  DME Arranged:  N/A DME Agency:  NA  HH Arranged:  NA HH Agency:  NA  Status of Service:  Completed, signed off  If discussed at [employer] of Stay Meetings, dates discussed:    Additional Comments:  Ninfa Meeker, RN 09/28/2016, 1:51 PM

## 2016-09-28 NOTE — Evaluation (Signed)
Occupational Therapy Evaluation Patient Details Name: Amanda Christensen MRN: [id number] DOB: 1961-07-12 Today's Date: 09/28/2016    History of Present Illness 55 y/o female presenting post-op Left Total knee arthroplasty. Pt has a past medical history of Arthritis; Asthma; Chronic pain; COPD ; Depression; DVT ; Heart murmur; anxiety; Hypertension; Insomnia; Obesity; Peripheral vascular disease ; Seizures; and Shortness of breath dyspnea. Had total Right knee surgery in 2012, and Left total shoulder in 2016.    Clinical Impression   PTA Pt was having an aide come in a few times a week to assist with ADL and IADL, and using a SPC for mobility. Pt currently min assist for ADL and mod assist for LB ADL with min assist/guard for mobility with RW. Pt motivated to work with therapy and gain independence. Pt will benefit from stay at SNF to maximize independence in ADL and all needs can be met at next venue of care. No further acute OT needs. Thank you for this referral.    Follow Up Recommendations  SNF;Supervision/Assistance - 24 hour    Equipment Recommendations  None recommended by OT;Other (comment) (TBD by next venue of care)    Recommendations for Other Services       Precautions / Restrictions Precautions Precautions: Knee Precaution Booklet Issued: No Precaution Comments: HEP provided previously by PT Required Braces or Orthoses: Knee Immobilizer - Left Knee Immobilizer - Left: Other (comment) (no orders, but in room and used during session) Restrictions Weight Bearing Restrictions: Yes LLE Weight Bearing: Weight bearing as tolerated      Mobility Bed Mobility Overal bed mobility: Needs Assistance Bed Mobility: Sit to Supine     Supine to sit: Min assist;HOB elevated Sit to supine: Min assist   General bed mobility comments: Min assist to lift BLE into the bed PT managing trunk and able to manaeuver up in the bed  Transfers Overall transfer level: Needs  assistance Equipment used: Rolling walker (2 wheeled) Transfers: Sit to/from Stand Sit to Stand: Min assist         General transfer comment: Min assist to support trunk during transition to stand.  Verbal cues for safe hand placement.     Balance Overall balance assessment: Needs assistance Sitting-balance support: No upper extremity supported;Feet supported Sitting balance-Leahy Scale: Good     Standing balance support: Bilateral upper extremity supported;During functional activity Standing balance-Leahy Scale: Poor Standing balance comment: sink level ADL                            ADL Overall ADL's : Needs assistance/impaired Eating/Feeding: Set up;Sitting   Grooming: Wash/dry hands;Minimal assistance;Standing Grooming Details (indicate cue type and reason): sink level     Lower Body Bathing: Moderate assistance;Sitting/lateral leans       Lower Body Dressing: Moderate assistance;Sit to/from stand   Toilet Transfer: Minimal assistance;Cueing for sequencing;Cueing for safety;Ambulation;Comfort height toilet;Grab bars;RW Armed forces technical officer Details (indicate cue type and reason): ambulated to bathroom Toileting- Clothing Manipulation and Hygiene: Supervision/safety;Sit to/from stand       Functional mobility during ADLs: Minimal assistance General ADL Comments: Pt with previous total knee, and so knowledgable about compensatory strategies     Vision     Perception     Praxis      Pertinent Vitals/Pain Pain Assessment: 0-10 Pain Score: 7  Pain Location: Left Knee Pain Descriptors / Indicators: Burning;Aching;Heaviness;Grimacing Pain Intervention(s): Limited activity within patient's tolerance;Monitored during session;Repositioned;Patient requesting pain meds-RN notified;Other (comment) (  in CPM at [facility])     Hand Dominance Right   Extremity/Trunk Assessment Upper Extremity Assessment Upper Extremity Assessment: LUE deficits/detail LUE Deficits /  Details: pt reports decreased left shoulder mobility. Abduction about 90 degrees   Lower Extremity Assessment Lower Extremity Assessment: LLE deficits/detail RLE Deficits / Details: right leg with h/o old TKA which pt reports locks up on her on the stairs.  She reports her current orthopedic physician states that the components are loose/ LLE Deficits / Details: left leg with normal post op pain and weakness, ankle at least 3/5, knee 2/5, hip 2+/5   Cervical / Trunk Assessment Cervical / Trunk Assessment: Normal   Communication Communication Communication: No difficulties   Cognition Arousal/Alertness: Awake/alert Behavior During Therapy: WFL for tasks assessed/performed Overall Cognitive Status: Within Functional Limits for tasks assessed                     General Comments       Exercises Exercises: Total Joint     Shoulder Instructions      Home Living Family/patient expects to be discharged to:: Skilled nursing facility Living Arrangements: Children (daughter and grandson)   Type of Home: Mobile home Home Access: Stairs to enter Entrance Stairs-Number of Steps: 4 Entrance Stairs-Rails: None Home Layout: One level     Bathroom Shower/Tub: Tub/shower unit;Walk-in shower Shower/tub characteristics: Curtain Biochemist, clinical: Programmer, systems: No   Home Equipment: Cane - single point   Additional Comments: Pt has not used tub, uses walk in shower that is very small - no room for shower seat      Prior Functioning/Environment Level of Independence: Needs assistance  Gait / Transfers Assistance Needed: used SPC depending on how she was feeling ADL's / Homemaking Assistance Needed: Has an aide that comes a few times a week to assist with  ADL and IADL   Comments: at times used a cane depending on the day        OT Problem List: Decreased strength;Decreased range of motion;Decreased activity tolerance;Impaired balance (sitting and/or  standing);Decreased safety awareness;Decreased knowledge of use of DME or AE;Obesity;Pain;Impaired UE functional use   OT Treatment/Interventions:      OT Goals(Current goals can be found in the care plan section) Acute Rehab OT Goals Patient Stated Goal: to go to rehab before going home OT Goal Formulation: With patient Time For Goal Achievement: 10/05/16 Potential to Achieve Goals: Good  OT Frequency:     Barriers to D/C:            Co-evaluation              End of Session Equipment Utilized During Treatment: Gait belt;Rolling walker;Left knee immobilizer CPM Left Knee CPM Left Knee: On Left Knee Flexion (Degrees): 70 Left Knee Extension (Degrees): 0 Nurse Communication: Patient requests pain meds (told Pt that RN was was another Pt and would be in ASAP)  Activity Tolerance: Patient tolerated treatment well Patient left: in bed;in CPM;with call bell/phone within reach;with nursing/sitter in room   Time: 1436-1520 OT Time Calculation (min): 44 min Charges:  OT General Charges $OT Visit: 1 Procedure OT Evaluation $OT Eval Moderate Complexity: 1 Procedure OT Treatments $Self Care/Home Management : 23-37 mins G-Codes:    Merri Ray Robbert Langlinais Oct 03, 2016, 3:34 PM  Hulda Humphrey OTR/L [phone]

## 2016-09-28 NOTE — Progress Notes (Signed)
Subjective: 1 Day Post-Op Procedure(s) (LRB): LEFT TOTAL KNEE ARTHROPLASTY (Left) Patient reports pain as moderate.    Objective: Vital signs in last 24 hours: Temp:  [97.5 F (36.4 C)-98.1 F (36.7 C)] 98 F (36.7 C) (11/09 0418) Pulse Rate:  [49-86] 60 (11/09 0418) Resp:  [11-23] 16 (11/09 0418) BP: (83-140)/(61-102) 112/63 (11/09 0418) SpO2:  [97 %-100 %] 100 % (11/09 0418) Weight:  [90.7 kg (200 lb)] 90.7 kg (200 lb) (11/08 0912)  Intake/Output from previous day: 11/08 0701 - 11/09 0700 In: 1400 [I.V.:1400] Out: 1580 [Urine:1570; Blood:10] Intake/Output this shift: No intake/output data recorded.   Recent Labs  09/28/16 0533  HGB 12.6    Recent Labs  09/28/16 0533  WBC 6.8  RBC 4.48  HCT 39.6  PLT 174    Recent Labs  09/28/16 0533  NA 137  K 3.6  CL 105  CO2 28  BUN 7  CREATININE 0.83  GLUCOSE 123*  CALCIUM 8.9   No results for input(s): LABPT, INR in the last 72 hours.  Neurologically intact Neurovascular intact Sensation intact distally Intact pulses distally Dorsiflexion/Plantar flexion intact Incision: dressing C/D/I No cellulitis present Compartment soft  Assessment/Plan: 1 Day Post-Op Procedure(s) (LRB): LEFT TOTAL KNEE ARTHROPLASTY (Left) Advance diet Up with therapy D/C IV fluids Discharge to SNF once approved by insurance WBAT LLE Dry dressing change prn Will order flomax for acute urinary retention Will increase frequency for po dialudid   Fannie Knee 09/28/2016, 7:27 AM

## 2016-09-28 NOTE — Clinical Social Work Note (Signed)
Clinical Social Work Assessment  Patient Details  Name: Amanda Christensen MRN: [id number] Date of Birth: [birth year]  Date of referral:  09/28/16               Reason for consult:  Facility Placement                Permission sought to share information with:   (Facilities) Permission granted to share information::   (Facilities)  Name::        Agency::     Relationship::     Contact Information:     Housing/Transportation Living arrangements for the past 2 months:  Single Family Home (Patient states that she lives at home in [place] with her daughter.) Source of Information:  Patient Patient Interpreter Needed:  None Criminal Activity/Legal Involvement Pertinent to Current Situation/Hospitalization:  No - Comment as needed Significant Relationships:  Adult Children Lives with:  Adult Children Do you feel safe going back to the place where you live?   (Patient is interested in SNF) Need for family participation in patient care:  No (Coment)  Care giving concerns:  Patient states that she needs assistance with ADLs. She states that she feels she would get the appropriate care from going to a SNF.   Social Worker assessment / plan:  SW met with patient at bedside. Daughter and ex husband were present. Patient states that she is interested in facility. Patient is alert and oriented. Patient is agreeable to be referred to SNF. SW will refer pt out.   Employment status:    Insurance information:  Medicaid [place] PT Recommendations:  [place] / Referral to community resources:   (SNF)  Patient/Family's Response to care:  Appropriate.   Patient/Family's Understanding of and Emotional Response to Diagnosis, Current Treatment, and Prognosis:  No questions.   Emotional Assessment Appearance:  Appears stated age Attitude/Demeanor/Rapport:   (Appropriate) Affect (typically observed):  Accepting Orientation:  Oriented to Self, Oriented to Place, Oriented to   Time, Oriented to Situation Alcohol / Substance use:  Not Applicable Psych involvement (Current and /or in the community):  No (Comment)  Discharge Needs  Concerns to be addressed:  No discharge needs identified Readmission within the last 30 days:  No Current discharge risk:  None Barriers to Discharge:  No Barriers Identified   Bernita Buffy 09/28/2016, 2:47 PM

## 2016-09-28 NOTE — NC FL2 (Signed)
[place] LEVEL OF CARE SCREENING TOOL     IDENTIFICATION  Patient Name: Amanda Christensen Birthdate: [birth year] Sex: female Admission Date (Current Location): 09/27/2016  [facility] and [place] Number:  Herbalist and Address:  [facility] [place], [place] [address], [place], [place] [address]      Provider Number: [id number]  Attending Physician Name and Address:  [name], MD  Relative Name and Phone Number:       Current Level of Care: Hospital Recommended Level of Care: [place] Prior Approval Number:    Date Approved/Denied:   PASRR Number:  ([id number])  Discharge Plan: SNF    Current Diagnoses: Patient Active Problem List   Diagnosis Date Noted  . Total knee replacement status, left 09/27/2016  . S/P shoulder replacement 01/08/2015  . Acute renal failure ([place]) 08/02/2014  . Rhabdomyolysis 08/02/2014  . Hypotension 08/02/2014  . Lactic acidosis 08/02/2014  . Arthritis 08/02/2014  . Hyperlipidemia 08/02/2014  . Asthma 08/02/2014  . Chest pain 03/26/2014  . COPD (chronic obstructive pulmonary disease) ([place]) 03/25/2014  . Hypertension 03/25/2014  . Chest tightness or pressure 03/25/2014  . Supratherapeutic INR 03/25/2014  . Osteoarthritis 03/25/2014  . History of DVT (deep vein thrombosis) 03/25/2014    Orientation RESPIRATION BLADDER Height & Weight     Self, Time, Situation, Place  Normal Continent Weight: 200 lb (90.7 kg) Height:  5' (152.4 cm)  BEHAVIORAL SYMPTOMS/MOOD NEUROLOGICAL BOWEL NUTRITION STATUS      Continent    AMBULATORY STATUS COMMUNICATION OF NEEDS Skin   Extensive Assist Verbally Normal                       Personal Care Assistance Level of Assistance  Bathing, Dressing Bathing Assistance: Maximum assistance   Dressing Assistance: Maximum assistance     Functional Limitations Info             SPECIAL CARE FACTORS FREQUENCY                        Contractures      Additional Factors Info   (Full)               Current Medications (09/28/2016):  This is the current hospital active medication list Current Facility-Administered Medications  Medication Dose Route Frequency Provider Last Rate Last Dose  . acetaminophen (TYLENOL) tablet 650 mg  650 mg Oral Q6H PRN Aundra Dubin, PA-C   650 mg at 09/27/16 2210   Or  . acetaminophen (TYLENOL) suppository 650 mg  650 mg Rectal Q6H PRN Aundra Dubin, PA-C      . albuterol (PROVENTIL) (2.5 MG/3ML) 0.083% nebulizer solution 3 mL  3 mL Inhalation Q6H PRN Aundra Dubin, PA-C      . alum & mag hydroxide-simeth (MAALOX/MYLANTA) 200-200-20 MG/5ML suspension 30 mL  30 mL Oral Q4H PRN Aundra Dubin, PA-C      . apixaban Arne [place]) tablet 2.5 mg  2.5 mg Oral Q12H Aundra Dubin, PA-C   2.5 mg at 09/28/16 0857  . bisacodyl (DULCOLAX) suppository 10 mg  10 mg Rectal Daily PRN Aundra Dubin, PA-C      . celecoxib (CELEBREX) capsule 200 mg  200 mg Oral Q12H Aundra Dubin, PA-C   200 mg at 09/28/16 0857  . diphenhydrAMINE (BENADRYL) 12.5 MG/5ML elixir 12.5-25 mg  12.5-25 mg Oral Q4H PRN Aundra Dubin, PA-C  25 mg at 09/28/16 0850  . docusate sodium (COLACE) capsule 100 mg  100 mg Oral BID Aundra Dubin, PA-C   100 mg at 09/28/16 0855  . fluticasone furoate-vilanterol (BREO ELLIPTA) 200-25 MCG/INH 1 puff  1 puff Inhalation Daily Aundra Dubin, PA-C   1 puff at 09/28/16 0909  . HYDROmorphone (DILAUDID) injection 0.5-1 mg  0.5-1 mg Intravenous Q2H PRN Aundra Dubin, PA-C   1 mg at 09/28/16 1244  . lisinopril (PRINIVIL,ZESTRIL) tablet 20 mg  20 mg Oral Daily Aundra Dubin, PA-C   20 mg at 09/28/16 0856  . magnesium citrate solution 1 Bottle  1 Bottle Oral Once PRN Aundra Dubin, PA-C      . menthol-cetylpyridinium (CEPACOL) lozenge 3 mg  1 lozenge Oral PRN Aundra Dubin, PA-C       Or  . phenol (CHLORASEPTIC) mouth spray 1 spray  1 spray Mouth/Throat PRN Aundra Dubin, PA-C       . methocarbamol (ROBAXIN) tablet 500 mg  500 mg Oral Q6H PRN Aundra Dubin, PA-C   500 mg at 09/28/16 0549   Or  . methocarbamol (ROBAXIN) 500 mg in dextrose 5 % 50 mL IVPB  500 mg Intravenous Q6H PRN Aundra Dubin, PA-C      . metoCLOPramide (REGLAN) tablet 5-10 mg  5-10 mg Oral Q8H PRN Aundra Dubin, PA-C       Or  . metoCLOPramide (REGLAN) injection 5-10 mg  5-10 mg Intravenous Q8H PRN Aundra Dubin, PA-C      . montelukast (SINGULAIR) tablet 10 mg  10 mg Oral QHS Aundra Dubin, PA-C   10 mg at 09/27/16 2210  . ondansetron (ZOFRAN) tablet 4 mg  4 mg Oral Q6H PRN Aundra Dubin, PA-C   4 mg at 09/28/16 0155   Or  . ondansetron (ZOFRAN) injection 4 mg  4 mg Intravenous Q6H PRN Aundra Dubin, PA-C      . polyethylene glycol (MIRALAX / GLYCOLAX) packet 17 g  17 g Oral Daily PRN Aundra Dubin, PA-C      . tamsulosin [facility]) capsule 0.4 mg  0.4 mg Oral Daily Aundra Dubin, PA-C   0.4 mg at 09/28/16 0856  . traZODone (DESYREL) tablet 100 mg  100 mg Oral QHS Aundra Dubin, PA-C   100 mg at 09/27/16 2210  . verapamil (CALAN-SR) CR tablet 240 mg  240 mg Oral QHS Aundra Dubin, PA-C   240 mg at 09/27/16 2221  . zolpidem (AMBIEN) tablet 5 mg  5 mg Oral QHS PRN Aundra Dubin, PA-C         Discharge Medications: Please see discharge summary for a list of discharge medications.  Relevant Imaging Results:  Relevant Lab Results:   Additional Information  (SS: [id number])  Venetia Maxon, Lynnda Child

## 2016-09-28 NOTE — Discharge Instructions (Signed)

## 2016-09-28 NOTE — Evaluation (Signed)
Physical Therapy Evaluation Patient Details Name: Amanda Christensen MRN: [id number] DOB: 08-21-1961 Today's Date: 09/28/2016   History of Present Illness  55 y/o female presenting post-op Left Total knee arthroplasty. Pt has a past medical history of Arthritis; Asthma; Chronic pain; COPD ; Depression; DVT ; Heart murmur; anxiety; Hypertension; Insomnia; Obesity; Peripheral vascular disease ; Seizures; and Shortness of breath dyspnea. Had total Right knee surgery in 2012, and Left total shoulder in 2016.   Clinical Impression  Pt is POD #1 and is limited by pain and weakness.  She was able to walk a short distance into the hallway and initiate LE HEP.  She plans to go to SNF for rehab and this plan is appropriate.  PT will follow acutely.     Follow Up Recommendations SNF    Equipment Recommendations  Rolling walker with 5" wheels;3in1 (PT) (wide for both)    Recommendations for Other Services   NA    Precautions / Restrictions Precautions Precautions: Knee Precaution Booklet Issued: Yes (comment) Precaution Comments: knee exercise handout given  Required Braces or Orthoses: Knee Immobilizer - Left Restrictions LLE Weight Bearing: Weight bearing as tolerated      Mobility  Bed Mobility Overal bed mobility: Needs Assistance Bed Mobility: Supine to Sit     Supine to sit: Min assist;HOB elevated     General bed mobility comments: Min assist to help progress left leg over EOB.  Pt managing her own trunk with use of the bed rail for leverage.  HOB mildly elevated.   Transfers Overall transfer level: Needs assistance   Transfers: Sit to/from Stand Sit to Stand: Min assist         General transfer comment: Min assist to support trunk during transition to stand.  Verbal cues for safe hand placement.   Ambulation/Gait Ambulation/Gait assistance: Min assist Ambulation Distance (Feet): 35 Feet Assistive device: Rolling walker (2 wheeled) Gait Pattern/deviations: Step-through  pattern;Antalgic Gait velocity: decreased Gait velocity interpretation: Below normal speed for age/gender General Gait Details: Pt with moderately antalgic gait pattern, mild lightheadedness and pain limiting gait distance.  Verbal cues for correct LE sequencing.          Balance Overall balance assessment: Needs assistance Sitting-balance support: Feet supported;No upper extremity supported Sitting balance-Leahy Scale: Good     Standing balance support: Bilateral upper extremity supported Standing balance-Leahy Scale: Poor                               Pertinent Vitals/Pain Pain Assessment: 0-10 Pain Score: 9  Pain Location: left knee Pain Descriptors / Indicators: Aching;Burning;Grimacing;Guarding Pain Intervention(s): Limited activity within patient's tolerance;Monitored during session;Repositioned    Home Living Family/patient expects to be discharged to:: Skilled nursing facility Living Arrangements: Children (daughter and grandson)   Type of Home: Mobile home Home Access: Stairs to enter Entrance Stairs-Rails: None Entrance Stairs-Number of Steps: 4 Home Layout: One level Home Equipment: Cane - single point      Prior Function Level of Independence: Independent with assistive device(s)         Comments: at times used a cane depending on the day     Hand Dominance   Dominant Hand: Right    Extremity/Trunk Assessment   Upper Extremity Assessment: LUE deficits/detail       LUE Deficits / Details: pt reports decreased left shoulder mobility.    Lower Extremity Assessment: LLE deficits/detail;RLE deficits/detail RLE Deficits / Details: right leg with  h/o old TKA which pt reports locks up on her on the stairs.  She reports her current orthopedic physician states that the components are loose/ LLE Deficits / Details: left leg with normal post op pain and weakness, ankle at least 3/5, knee 2/5, hip 2+/5     Communication   Communication: No  difficulties  Cognition Arousal/Alertness: Awake/alert Behavior During Therapy: WFL for tasks assessed/performed Overall Cognitive Status: Within Functional Limits for tasks assessed                         Exercises Total Joint Exercises Ankle Circles/Pumps: AROM;Both;10 reps Quad Sets: AROM;Left;10 reps Towel Squeeze: AROM;Both;10 reps Heel Slides: AAROM;Left;10 reps Goniometric ROM: 12-55   Assessment/Plan    PT Assessment Patient needs continued PT services  PT Problem List Decreased strength;Decreased range of motion;Decreased activity tolerance;Decreased balance;Decreased mobility;Decreased knowledge of use of DME;Decreased knowledge of precautions;Obesity;Pain          PT Treatment Interventions DME instruction;Gait training;Stair training;Functional mobility training;Therapeutic activities;Therapeutic exercise;Balance training;Patient/family education;Manual techniques;Modalities    PT Goals (Current goals can be found in the Care Plan section)  Acute Rehab PT Goals Patient Stated Goal: to go to rehab before going home PT Goal Formulation: With patient Time For Goal Achievement: 10/05/16 Potential to Achieve Goals: Good    Frequency 7X/week           End of Session Equipment Utilized During Treatment: Gait belt;Left knee immobilizer Activity Tolerance: Patient limited by pain Patient left: in chair;with call bell/phone within reach;with family/visitor present Nurse Communication: Mobility status;Patient requests pain meds         Time: 1144-1230 PT Time Calculation (min) (ACUTE ONLY): 46 min   Charges:   PT Evaluation $PT Eval Moderate Complexity: 1 Procedure PT Treatments $Gait Training: 8-22 mins $Therapeutic Exercise: 8-22 mins        Keaton Stirewalt B. Kelseigh Diver, PT, DPT [phone]   09/28/2016, 2:54 PM

## 2016-09-29 LAB — BASIC METABOLIC PANEL
ANION GAP: 6 (ref 5–15)
BUN: 7 mg/dL (ref 6–20)
CALCIUM: 9.2 mg/dL (ref 8.9–10.3)
CO2: 26 mmol/L (ref 22–32)
CREATININE: 0.69 mg/dL (ref 0.44–1.00)
Chloride: 106 mmol/L (ref 101–111)
GLUCOSE: 137 mg/dL — AB (ref 65–99)
Potassium: 3.9 mmol/L (ref 3.5–5.1)
Sodium: 138 mmol/L (ref 135–145)

## 2016-09-29 LAB — CBC
HCT: 36.6 % (ref 36.0–46.0)
Hemoglobin: 12.1 g/dL (ref 12.0–15.0)
MCH: 28.9 pg (ref 26.0–34.0)
MCHC: 33.1 g/dL (ref 30.0–36.0)
MCV: 87.6 fL (ref 78.0–100.0)
PLATELETS: 167 10*3/uL (ref 150–400)
RBC: 4.18 MIL/uL (ref 3.87–5.11)
RDW: 13.2 % (ref 11.5–15.5)
WBC: 9.4 10*3/uL (ref 4.0–10.5)

## 2016-09-29 NOTE — Progress Notes (Signed)
Physical Therapy Treatment Patient Details Name: Amanda Christensen MRN: [id number] DOB: Apr 01, 1961 Today's Date: 09/29/2016    History of Present Illness 55 y/o female presenting post-op Left Total knee arthroplasty. Pt has a past medical history of Arthritis; Asthma; Chronic pain; COPD ; Depression; DVT ; Heart murmur; anxiety; Hypertension; Insomnia; Obesity; Peripheral vascular disease ; Seizures; and Shortness of breath dyspnea. Had total Right knee surgery in 2012, and Left total shoulder in 2016.     PT Comments    Patient is progressing toward mobility goals. Current plan remains appropriate.   Follow Up Recommendations  SNF     Equipment Recommendations  3in1 (PT);Rolling walker with 5" wheels;Other (comment) (wide for both)    Recommendations for Other Services       Precautions / Restrictions Precautions Precautions: Knee Precaution Booklet Issued: Yes (comment) Precaution Comments: reviewed knee precaution Required Braces or Orthoses: Knee Immobilizer - Left Restrictions Weight Bearing Restrictions: Yes LLE Weight Bearing: Weight bearing as tolerated    Mobility  Bed Mobility Overal bed mobility: Modified Independent Bed Mobility: Supine to Sit           General bed mobility comments: increased time and effort; HOB elevated  Transfers Overall transfer level: Needs assistance Equipment used: Rolling walker (2 wheeled) Transfers: Sit to/from Stand Sit to Stand: Min guard         General transfer comment: cues for hand placement; min guard for safety  Ambulation/Gait Ambulation/Gait assistance: Min guard Ambulation Distance (Feet): 100 Feet Assistive device: Rolling walker (2 wheeled) Gait Pattern/deviations: Step-to pattern;Step-through pattern;Decreased stance time - left;Decreased step length - right;Decreased stride length;Decreased weight shift to left;Antalgic Gait velocity: decreased   General Gait Details: step to pattern initially with  ability to increase WB on L LE with increased time; cues for sequencing, posture, and L heel strike; worked on improving step Automotive engineer    Modified Rankin (Stroke Patients Only)       Balance     Sitting balance-Leahy Scale: Good       Standing balance-Leahy Scale: Fair                      Cognition Arousal/Alertness: Awake/alert Behavior During Therapy: WFL for tasks assessed/performed Overall Cognitive Status: Within Functional Limits for tasks assessed                      Exercises Total Joint Exercises Quad Sets: AROM;Left;10 reps;Supine Towel Squeeze: AROM;Left;5 reps;Seated;Other (comment) (10 sec holds) Heel Slides: Left;10 reps;AAROM;Supine Hip ABduction/ADduction: Left;10 reps;AROM;Supine Straight Leg Raises: Left;10 reps;AROM;Supine Goniometric ROM: 5-60    General Comments        Pertinent Vitals/Pain Pain Assessment: 0-10 Pain Score: 6  Pain Location: L knee Pain Descriptors / Indicators: Grimacing;Guarding;Sore Pain Intervention(s): Limited activity within patient's tolerance;Monitored during session;Repositioned;RN gave pain meds during session;Ice applied    Home Living                      Prior Function            PT Goals (current goals can now be found in the care plan section) Acute Rehab PT Goals Patient Stated Goal: to go to rehab before going home Progress towards PT goals: Progressing toward goals    Frequency    7X/week      PT Plan Current plan remains appropriate  Co-evaluation             End of Session Equipment Utilized During Treatment: Gait belt Activity Tolerance: Patient tolerated treatment well Patient left: with call bell/phone within reach;with family/visitor present;in chair     Time: 1100-1141 PT Time Calculation (min) (ACUTE ONLY): 41 min  Charges:  $Gait Training: 8-22 mins $Therapeutic Exercise: 8-22  mins $Therapeutic Activity: 8-22 mins                    G Codes:      [place] April, PTA Pager: [phone]   09/29/2016, 12:19 PM

## 2016-09-29 NOTE — NC FL2 (Signed)
[place] LEVEL OF CARE SCREENING TOOL     IDENTIFICATION  Patient Name: Amanda Christensen Birthdate: [birth year] Sex: female Admission Date (Current Location): 09/27/2016  [facility] [place] Number:  Herbalist and Address:  [facility], [place] [address] [place], [place] [address]      Provider Number: [id number]  Attending Physician Name and Address:  [name], MD  Relative Name and Phone Number:       Current Level of Care: SNF Recommended Level of Care: [place] Prior Approval Number:    Date Approved/Denied:   PASRR Number:  ([id number])  Discharge Plan: SNF    Current Diagnoses: Patient Active Problem List   Diagnosis Date Noted  . Total knee replacement status, left 09/27/2016  . S/P shoulder replacement 01/08/2015  . Acute renal failure ([place]) 08/02/2014  . Rhabdomyolysis 08/02/2014  . Hypotension 08/02/2014  . Lactic acidosis 08/02/2014  . Arthritis 08/02/2014  . Hyperlipidemia 08/02/2014  . Asthma 08/02/2014  . Chest pain 03/26/2014  . COPD (chronic obstructive pulmonary disease) ([place]) 03/25/2014  . Hypertension 03/25/2014  . Chest tightness or pressure 03/25/2014  . Supratherapeutic INR 03/25/2014  . Osteoarthritis 03/25/2014  . History of DVT (deep vein thrombosis) 03/25/2014    Orientation RESPIRATION BLADDER Height & Weight     Self, Time, Situation, Place  Normal Continent Weight: 200 lb (90.7 kg) Height:  5' (152.4 cm)  BEHAVIORAL SYMPTOMS/MOOD NEUROLOGICAL BOWEL NUTRITION STATUS      Continent    AMBULATORY STATUS COMMUNICATION OF NEEDS Skin   Limited Assist Verbally Normal                       Personal Care Assistance Level of Assistance  Bathing, Dressing Bathing Assistance: Limited assistance   Dressing Assistance: Limited assistance     Functional Limitations Info             SPECIAL CARE FACTORS FREQUENCY                       Contractures  Contractures Info: Present    Additional Factors Info   (Full)               Current Medications (09/29/2016):  This is the current hospital active medication list Current Facility-Administered Medications  Medication Dose Route Frequency Provider Last Rate Last Dose  . acetaminophen (TYLENOL) tablet 650 mg  650 mg Oral Q6H PRN Aundra Dubin, PA-C   650 mg at 09/27/16 2210   Or  . acetaminophen (TYLENOL) suppository 650 mg  650 mg Rectal Q6H PRN Aundra Dubin, PA-C      . albuterol (PROVENTIL) (2.5 MG/3ML) 0.083% nebulizer solution 3 mL  3 mL Inhalation Q6H PRN Aundra Dubin, PA-C      . alum & mag hydroxide-simeth (MAALOX/MYLANTA) 200-200-20 MG/5ML suspension 30 mL  30 mL Oral Q4H PRN Aundra Dubin, PA-C      . apixaban Arne [place]) tablet 2.5 mg  2.5 mg Oral Q12H Aundra Dubin, PA-C   2.5 mg at 09/29/16 0819  . bisacodyl (DULCOLAX) suppository 10 mg  10 mg Rectal Daily PRN Aundra Dubin, PA-C      . celecoxib (CELEBREX) capsule 200 mg  200 mg Oral Q12H Aundra Dubin, PA-C   200 mg at 09/29/16 0819  . diphenhydrAMINE (BENADRYL) 12.5 MG/5ML elixir 12.5-25 mg  12.5-25 mg Oral Q4H PRN Aundra Dubin,  PA-C   25 mg at 09/28/16 2235  . docusate sodium (COLACE) capsule 100 mg  100 mg Oral BID Aundra Dubin, PA-C   100 mg at 09/29/16 0820  . fluticasone furoate-vilanterol (BREO ELLIPTA) 200-25 MCG/INH 1 puff  1 puff Inhalation Daily Aundra Dubin, PA-C   1 puff at 09/29/16 [id number]  . HYDROmorphone (DILAUDID) injection 0.5-1 mg  0.5-1 mg Intravenous Q2H PRN Aundra Dubin, PA-C   1 mg at 09/29/16 [id number]  . lisinopril (PRINIVIL,ZESTRIL) tablet 20 mg  20 mg Oral Daily Aundra Dubin, PA-C   20 mg at 09/29/16 [id number]  . magnesium citrate solution 1 Bottle  1 Bottle Oral Once PRN Aundra Dubin, PA-C      . menthol-cetylpyridinium (CEPACOL) lozenge 3 mg  1 lozenge Oral PRN Aundra Dubin, PA-C       Or  . phenol (CHLORASEPTIC) mouth spray 1 spray  1 spray Mouth/Throat PRN Aundra Dubin, PA-C      . methocarbamol (ROBAXIN) tablet 500 mg  500 mg Oral Q6H PRN Aundra Dubin, PA-C   500 mg at 09/29/16 0820   Or  . methocarbamol (ROBAXIN) 500 mg in dextrose 5 % 50 mL IVPB  500 mg Intravenous Q6H PRN Aundra Dubin, PA-C      . metoCLOPramide (REGLAN) tablet 5-10 mg  5-10 mg Oral Q8H PRN Aundra Dubin, PA-C       Or  . metoCLOPramide (REGLAN) injection 5-10 mg  5-10 mg Intravenous Q8H PRN Aundra Dubin, PA-C      . montelukast (SINGULAIR) tablet 10 mg  10 mg Oral QHS Aundra Dubin, PA-C   10 mg at 09/28/16 2234  . ondansetron (ZOFRAN) tablet 4 mg  4 mg Oral Q6H PRN Aundra Dubin, PA-C   4 mg at 09/28/16 0155   Or  . ondansetron (ZOFRAN) injection 4 mg  4 mg Intravenous Q6H PRN Aundra Dubin, PA-C      . polyethylene glycol (MIRALAX / GLYCOLAX) packet 17 g  17 g Oral Daily PRN Aundra Dubin, PA-C      . tamsulosin [facility]) capsule 0.4 mg  0.4 mg Oral Daily Aundra Dubin, PA-C   0.4 mg at 09/29/16 0819  . traZODone (DESYREL) tablet 100 mg  100 mg Oral QHS Aundra Dubin, PA-C   100 mg at 09/28/16 2234  . verapamil (CALAN-SR) CR tablet 240 mg  240 mg Oral QHS Aundra Dubin, PA-C   240 mg at 09/28/16 2235  . zolpidem (AMBIEN) tablet 5 mg  5 mg Oral QHS PRN Aundra Dubin, PA-C         Discharge Medications: Please see discharge summary for a list of discharge medications.  Relevant Imaging Results:  Relevant Lab Results:   Additional Information  (SS: [id number])  Venetia Maxon, Lynnda Child

## 2016-09-29 NOTE — Progress Notes (Signed)
Subjective: 2 Days Post-Op Procedure(s) (LRB): LEFT TOTAL KNEE ARTHROPLASTY (Left) Patient reports pain as mild.    Objective: Vital signs in last 24 hours: Temp:  [97.2 F (36.2 C)-98.9 F (37.2 C)] 98.6 F (37 C) (11/10 0612) Pulse Rate:  [62-75] 75 (11/10 0612) Resp:  [18] 18 (11/10 0612) BP: (103-126)/(53-69) 126/68 (11/10 0612) SpO2:  [98 %-100 %] 98 % (11/10 0922)  Intake/Output from previous day: 11/09 0701 - 11/10 0700 In: 240 [P.O.:240] Out: 560 [Urine:560] Intake/Output this shift: No intake/output data recorded.   Recent Labs  09/28/16 0533 09/29/16 0204  HGB 12.6 12.1    Recent Labs  09/28/16 0533 09/29/16 0204  WBC 6.8 9.4  RBC 4.48 4.18  HCT 39.6 36.6  PLT 174 167    Recent Labs  09/28/16 0533 09/29/16 0204  NA 137 138  K 3.6 3.9  CL 105 106  CO2 28 26  BUN 7 7  CREATININE 0.83 0.69  GLUCOSE 123* 137*  CALCIUM 8.9 9.2   No results for input(s): LABPT, INR in the last 72 hours.  Neurologically intact Neurovascular intact Sensation intact distally Intact pulses distally Dorsiflexion/Plantar flexion intact Incision: dressing C/D/I No cellulitis present Compartment soft  Assessment/Plan: 2 Days Post-Op Procedure(s) (LRB): LEFT TOTAL KNEE ARTHROPLASTY (Left) Advance diet Up with therapy Discharge to SNF vs. hhpt (if insurance does not approve SNF) WBAT LLE   Fannie Knee 09/29/2016, 12:54 PM

## 2016-09-29 NOTE — Progress Notes (Signed)
Report given to Butch Penny at [facility]

## 2016-09-29 NOTE — Progress Notes (Signed)
Orthopedic Tech Progress Note Patient Details:  Amanda Christensen Apr 02, 1961 [id number]  CPM Left Knee CPM Left Knee: On Left Knee Flexion (Degrees): 75 Left Knee Extension (Degrees): 0 Additional Comments: trapeze bar patient helper Applied CPM 0-75.  Kristopher Oppenheim 09/29/2016, 6:15 AM

## 2016-09-29 NOTE — Progress Notes (Signed)
SW spoke with patient and informed her that transportation has been arranged. SW made nurse aware that transportation has been arranged for 3:00PM. Janie/Admissions is aware.  [facility] [phone]

## 2016-10-10 ENCOUNTER — Other Ambulatory Visit (HOSPITAL_COMMUNITY): Payer: Self-pay | Admitting: Orthopedic Surgery

## 2016-10-10 ENCOUNTER — Ambulatory Visit (HOSPITAL_COMMUNITY)
Admission: RE | Admit: 2016-10-10 | Discharge: 2016-10-10 | Disposition: A | Discharge status: Home / Self Care | Payer: Medicaid Other | Source: Ambulatory Visit | Attending: Orthopedic Surgery | Admitting: Orthopedic Surgery

## 2016-10-10 DIAGNOSIS — M7989 Other specified soft tissue disorders: Principal | ICD-10-CM

## 2016-10-10 DIAGNOSIS — Z96652 Presence of left artificial knee joint: Secondary | ICD-10-CM | POA: Diagnosis not present

## 2016-10-10 DIAGNOSIS — M79605 Pain in left leg: Secondary | ICD-10-CM | POA: Insufficient documentation

## 2016-10-10 NOTE — Progress Notes (Signed)
VASCULAR LAB PRELIMINARY  PRELIMINARY  PRELIMINARY  PRELIMINARY  Left lower extremity venous duplex completed.    Preliminary report:  Left:  No evidence of DVT, superficial thrombosis, or Baker's cyst.  Amanda Christensen, RVS 10/10/2016, 1:23 PM

## 2016-10-25 NOTE — Addendum Note (Signed)
Addendum  created 10/25/16 [id number] by Catalina Gravel, MD   Anesthesia Intra Blocks edited, Sign clinical note

## 2016-10-26 ENCOUNTER — Ambulatory Visit: Payer: Medicaid Other | Admitting: Physical Therapy

## 2016-11-01 ENCOUNTER — Ambulatory Visit: Payer: Medicaid Other | Attending: Orthopedic Surgery | Admitting: Physical Therapy

## 2016-11-01 DIAGNOSIS — M25662 Stiffness of left knee, not elsewhere classified: Secondary | ICD-10-CM

## 2016-11-01 DIAGNOSIS — R600 Localized edema: Secondary | ICD-10-CM

## 2016-11-01 DIAGNOSIS — M6281 Muscle weakness (generalized): Secondary | ICD-10-CM | POA: Diagnosis present

## 2016-11-01 DIAGNOSIS — R262 Difficulty in walking, not elsewhere classified: Secondary | ICD-10-CM | POA: Diagnosis present

## 2016-11-01 DIAGNOSIS — M25562 Pain in left knee: Secondary | ICD-10-CM

## 2016-11-01 NOTE — Therapy (Signed)
[facility] [place], [place], [address] Phone: [phone]   Fax:  [phone]  Physical Therapy Evaluation  Patient Details  Name: Amanda Christensen MRN: [id number] Date of Birth: 01/29/1961 Referring Provider: Dr Kathryne Hitch   Encounter Date: 11/01/2016      PT End of Session - 11/01/16 1349    Visit Number 1   Number of Visits 8   Date for PT Re-Evaluation 12/27/16   Authorization Type Medicaid ( has used 4 visits with home health)    PT Start Time 1015   PT Stop Time 1102   PT Time Calculation (min) 47 min   Activity Tolerance Patient tolerated treatment well   Behavior During Therapy [facility] for tasks assessed/performed      Past Medical History:  Diagnosis Date  . Arthritis   . Asthma   . Chronic pain   . COPD (chronic obstructive pulmonary disease) ([place])   . Depression    history of   . 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 Heart murmur   . History of anxiety    "used to have anxiety attacks"  . History of bronchitis   . Hypertension    takes Verapamil and Lisinopril  daily  . Insomnia    takes Trazodone nightly  . Obesity   . Peripheral vascular disease ([facility]) 10   rt leg dvt   . Seizures ([place]) 03   was on medications none now off  since 2011  . Shortness of breath dyspnea     Past Surgical History:  Procedure Laterality Date  . BREAST LUMPECTOMY WITH NEEDLE LOCALIZATION Left 09/13/2016   Procedure: LEFT BREAST LUMPECTOMY WITH NEEDLE LOCALIZATION;  Surgeon: Clovis Riley, MD;  Location: [place];  Service: General;  Laterality: Left;  . CESAREAN SECTION    . COLONOSCOPY    . DILATION AND CURETTAGE OF UTERUS    . HERNIA REPAIR     umbilical  . JOINT REPLACEMENT    . miscarriage    . ROTATOR CUFF REPAIR Left   . TOTAL KNEE ARTHROPLASTY Right   . TOTAL KNEE ARTHROPLASTY Left 09/27/2016   Procedure: LEFT TOTAL KNEE ARTHROPLASTY;  Surgeon:  [name], MD;  Location: [place];  Service: Orthopedics;  Laterality: Left;  . TOTAL SHOULDER ARTHROPLASTY Left 01/08/2015   dr Veverly Fells  . TOTAL SHOULDER ARTHROPLASTY Left 01/08/2015   Procedure: LEFT TOTAL SHOULDER ARTHROPLASTY;  Surgeon: Augustin Schooling, MD;  Location: [place];  Service: Orthopedics;  Laterality: Left;    There were no vitals filed for this visit.       Subjective Assessment - 11/01/16 1020    Subjective Patient had a left TKA on 09/27/2016. Since that point she reports knee pain but her biggest problem is pain in the back. Paitent is currently using a cane. Prior to her lknee replacement she did not require a cane. She has had 4 visits of home health. Her right knee was replaced in 2012. She has had continued problems with the knee. She is lacking ROM on the right and has had continued pain. Per patient her doctor feels like her knee need to be fixed. Her pain level can vary in the left leg. She has some difficulty finding a comfortable position at night.    Limitations Walking;Standing;Other (comment)  lying in bed   How long can you stand comfortably? < 10 minutes  How long can you walk comfortably? household distances before pain increases   Diagnostic tests Nothing post op    Patient Stated Goals to have less pain in her left leg and back with daily activity.    Currently in Pain? Yes   Pain Score 7   10/10 at worst    Pain Location Knee   Pain Orientation Left   Pain Descriptors / Indicators Aching;Stabbing;Sharp   Pain Type Surgical pain   Pain Onset More than a month ago   Pain Frequency Intermittent   Aggravating Factors  standing walking, bending    Pain Relieving Factors rest, ice    Effect of Pain on Daily Activities Difficulty perfroming daily activity    Multiple Pain Sites No            [facility] PT Assessment - 11/01/16 0001      Assessment   Medical Diagnosis L TKA 09/27/2016   Referring Provider Dr Kathryne Hitch    Onset Date/Surgical Date  10/30/16   Hand Dominance Right   Next MD Visit 2 weeks    Prior Therapy None      Precautions   Precautions None     Restrictions   Weight Bearing Restrictions No     Balance Screen   Has the patient fallen in the past 6 months No     Home Environment   Additional Comments Patient has 5 steps going up the front      Prior Function   Level of Independence Independent   Vocation On disability   Leisure walking      Cognition   Overall Cognitive Status Within Functional Limits for tasks assessed   Attention Focused   Focused Attention Appears intact   Memory Appears intact   Awareness Appears intact   Problem Solving Appears intact     Observation/Other Assessments   Observations Right knee valgus noted in sitting    Focus on Therapeutic Outcomes (FOTO)  69% limitation      Observation/Other Assessments-Edema    Edema Circumferential     Circumferential Edema   Circumferential - Right 52   Circumferential - Left  54.3     Sensation   Additional Comments No numbess into the left foot.      Coordination   Gross Motor Movements are Fluid and Coordinated Yes   Fine Motor Movements are Fluid and Coordinated Yes     ROM / Strength   AROM / PROM / Strength AROM;PROM;Strength     AROM   Overall AROM Comments Pain with hip flexion    AROM Assessment Site Knee;Hip   Right/Left Knee Right   Right Knee Extension 7   Right Knee Flexion 47     PROM   PROM Assessment Site Knee   Right/Left Knee Left;Right   Right Knee Extension 4   Right Knee Flexion 78   Left Knee Extension 5   Left Knee Flexion 55     Strength   Strength Assessment Site Knee;Hip   Right/Left Hip Right;Left   Right Hip Flexion 4+/5   Right Hip ABduction 4/5   Right Hip ADduction 5/5   Left Hip Flexion 3+/5   Left Hip Extension 3+/5   Left Hip ABduction 3/5   Left Hip ADduction 3+/5     Palpation   Patella mobility limited patella mobility bilateral    Palpation comment No significant  tenderness to palapation at this time     Ambulation/Gait   Gait Comments dcreased single  leg stance on both legs, valgus on the right, decreased stride length, slowed cadence.                    [place] Adult PT Treatment/Exercise - 11/01/16 0001      Knee/Hip Exercises: Stretches   Other Knee/Hip Stretches knee flexion stretch with strap 10x w/ 5 second hold.      Knee/Hip Exercises: Seated   Long Arc Quad Limitations 2x10    Other Seated Knee/Hip Exercises seated knee flexion stretch 3x20sec      Knee/Hip Exercises: Supine   Quad Sets Limitations x10 limited quad activiation   Short Arc Quad Sets Limitations 2x10      Modalities   Modalities Cryotherapy     Cryotherapy   Number Minutes Cryotherapy 10 Minutes   Cryotherapy Location Knee   Type of Cryotherapy Ice pack     Manual Therapy   Manual therapy comments PROM intop flexion and extension> Pain noted with both stretches.                   PT Short Term Goals - 11/01/16 1404      PT SHORT TERM GOAL #1   Title pt will be I with basic/advanced HEP   Baseline minimal home exercise program from home health physical therapy   Time 4   Period Weeks   Status New     PT SHORT TERM GOAL #2   Title Patient will increase knee flexion by 20 degrees    Baseline 55 degrees of flexion    Time 4   Period Weeks   Status New     PT SHORT TERM GOAL #3   Title Patient will increase left knee extension by 5 degrees    Baseline 5 degrees from straight    Time 4   Period Weeks   Status New     PT SHORT TERM GOAL #4   Title Patient will increase gross left lower extremity strength to 5/5    Time 4   Period Weeks   Status New           PT Long Term Goals - 11/01/16 1406      PT LONG TERM GOAL #1   Title Patient will dmeostrate 90 degrees of knee flexion in order to get in and out of a chair    Baseline 55 degrees of flexion    Time 8   Period Weeks   Status On-going     PT LONG TERM GOAL #2    Title Patient will go up/down 4 steps with reciprocal gait    Baseline can not go up/down the steps with left leg lead    Time 8   Period Weeks   Status New     PT LONG TERM GOAL #3   Title Patient will walk 1000' with least restrictive device without significant pain    Baseline difficulty walking short distances    Time 8   Period Weeks   Status New     PT LONG TERM GOAL #4   Title paytiet will show a 57 % limitation on FOTO    Baseline 69% limitation    Time 8   Period Weeks   Status New               Plan - 11/01/16 1353    Clinical Impression Statement Patient is a 55 year old female S/P L TKA on 09/27/2016. She presents with significant limitations  in flexion at this time. Her extension is progressing well. She has limited strength. She has pain in and limited motion in her right knee as well. She has an antalgic gait at this time. She is using a cane for mobility. She has pain that can reach a 10/10. She would benefit from furter skilled therapy to adress the above deficits. She only has 4 visits left per medicaid restrictions.    Rehab Potential Good   PT Frequency 2x / week   PT Duration 8 weeks   PT Treatment/Interventions ADLs/Self Care Home Management;Cryotherapy;Electrical Stimulation;Iontophoresis 4mg /ml Dexamethasone;Therapeutic activities;Therapeutic exercise;Neuromuscular re-education;Patient/family education;Dry needling;Manual techniques;Gait training;Ultrasound;Functional mobility training;Passive range of motion;Moist Heat;Taping;Splinting   PT Next Visit Plan Focus on regaining knee flexion; add standing exercises.  continue with ice, add exercise bike;    PT Home Exercise Plan stapr stretch, stretching at the edge of the table; quad sets, saq, laq    Consulted and Agree with Plan of Care Patient      Patient will benefit from skilled therapeutic intervention in order to improve the following deficits and impairments:  Abnormal gait, Difficulty  walking, Pain, Decreased safety awareness, Decreased activity tolerance, Decreased range of motion, Decreased strength, Increased edema  Visit Diagnosis: Acute pain of left knee - Plan: PT plan of care cert/re-cert  Stiffness of left knee, not elsewhere classified - Plan: PT plan of care cert/re-cert  Muscle weakness (generalized) - Plan: PT plan of care cert/re-cert  Difficulty in walking, not elsewhere classified - Plan: PT plan of care cert/re-cert  Localized edema - Plan: PT plan of care cert/re-cert      G-Codes - [id number]    Functional Assessment Tool Used FOTO, clinical decision making    Functional Limitation Mobility: Walking and moving around   Mobility: Walking and Moving Around Current Status [id number]) At least 60 percent but less than 80 percent impaired, limited or restricted   Mobility: Walking and Moving Around Goal Status [phone]) At least 40 percent but less than 60 percent impaired, limited or restricted       Problem List Patient Active Problem List   Diagnosis Date Noted  . Total knee replacement status, left 09/27/2016  . S/P shoulder replacement 01/08/2015  . Acute renal failure ([place]) 08/02/2014  . Rhabdomyolysis 08/02/2014  . Hypotension 08/02/2014  . Lactic acidosis 08/02/2014  . Arthritis 08/02/2014  . Hyperlipidemia 08/02/2014  . Asthma 08/02/2014  . Chest pain 03/26/2014  . COPD (chronic obstructive pulmonary disease) ([place]) 03/25/2014  . Hypertension 03/25/2014  . Chest tightness or pressure 03/25/2014  . Supratherapeutic INR 03/25/2014  . Osteoarthritis 03/25/2014  . History of DVT (deep vein thrombosis) 03/25/2014    Carney Living PT DPT  11/01/2016, 5:49 PM  [facility] [address] [place], [place], [address] Phone: [phone]   Fax:  [phone]  Name: Amanda Christensen MRN: [id number] Date of Birth: 16-Jun-1961

## 2016-11-07 ENCOUNTER — Ambulatory Visit: Payer: Medicaid Other | Admitting: Physical Therapy

## 2016-11-07 DIAGNOSIS — M25562 Pain in left knee: Secondary | ICD-10-CM | POA: Diagnosis not present

## 2016-11-07 DIAGNOSIS — R262 Difficulty in walking, not elsewhere classified: Secondary | ICD-10-CM

## 2016-11-07 DIAGNOSIS — M6281 Muscle weakness (generalized): Secondary | ICD-10-CM

## 2016-11-07 DIAGNOSIS — R600 Localized edema: Secondary | ICD-10-CM

## 2016-11-07 DIAGNOSIS — M25662 Stiffness of left knee, not elsewhere classified: Secondary | ICD-10-CM

## 2016-11-07 NOTE — Therapy (Signed)
[place], [place], [address] Phone: [phone]   Fax:  [phone]  Physical Therapy Treatment  Patient Details  Name: SANTIAGO LINEN MRN: [id number] Date of Birth: Jan 27, 1961 Referring Provider: Dr Kathryne Hitch   Encounter Date: 11/07/2016      PT End of Session - 11/07/16 1518    Visit Number 2   Number of Visits 5   Date for PT Re-Evaluation 12/27/16   Authorization Type Medicaid ( has used 4 visits with home health)    PT Start Time 1145   PT Stop Time 1242   PT Time Calculation (min) 57 min   Activity Tolerance Patient tolerated treatment well   Behavior During Therapy [facility] for tasks assessed/performed      Past Medical History:  Diagnosis Date  . Arthritis   . Asthma   . Chronic pain   . COPD (chronic obstructive pulmonary disease) ([place])   . Depression    history of   . 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 Heart murmur   . History of anxiety    "used to have anxiety attacks"  . History of bronchitis   . Hypertension    takes Verapamil and Lisinopril  daily  . Insomnia    takes Trazodone nightly  . Obesity   . Peripheral vascular disease (H[facility]) 10   rt leg dvt   . Seizures ([place]) 03   was on medications none now off  since 2011  . Shortness of breath dyspnea     Past Surgical History:  Procedure Laterality Date  . BREAST LUMPECTOMY WITH NEEDLE LOCALIZATION Left 09/13/2016   Procedure: LEFT BREAST LUMPECTOMY WITH NEEDLE LOCALIZATION;  Surgeon: Clovis Riley, MD;  Location: [place];  Service: General;  Laterality: Left;  . CESAREAN SECTION    . COLONOSCOPY    . DILATION AND CURETTAGE OF UTERUS    . HERNIA REPAIR     umbilical  . JOINT REPLACEMENT    . miscarriage    . ROTATOR CUFF REPAIR Left   . TOTAL KNEE ARTHROPLASTY Right   . TOTAL KNEE ARTHROPLASTY Left 09/27/2016   Procedure: LEFT TOTAL KNEE ARTHROPLASTY;  Surgeon:  [name], MD;  Location: [place];  Service: Orthopedics;  Laterality: Left;  . TOTAL SHOULDER ARTHROPLASTY Left 01/08/2015   dr Veverly Fells  . TOTAL SHOULDER ARTHROPLASTY Left 01/08/2015   Procedure: LEFT TOTAL SHOULDER ARTHROPLASTY;  Surgeon: Augustin Schooling, MD;  Location: [place];  Service: Orthopedics;  Laterality: Left;    There were no vitals filed for this visit.      Subjective Assessment - 11/07/16 1507    Subjective Ptient reports her knee has been sore. She saw the MD today who advised her to continue to work on motion and strengthening. seh was sore after the last visit.    Limitations Walking;Standing;Other (comment)   How long can you stand comfortably? < 10 minutes    How long can you walk comfortably? household distances before pain increases   Diagnostic tests Nothing post op    Patient Stated Goals to have less pain in her left leg and back with daily activity.    Currently in Pain? Yes   Pain Score 6    Pain Location Knee   Pain Orientation Left   Pain Descriptors / Indicators Aching   Pain Type Surgical pain   Pain Onset More than a  month ago   Pain Frequency Intermittent   Aggravating Factors  standing, walking , bending    Pain Relieving Factors rest, ice    Effect of Pain on Daily Activities difficulty performing daily activity    Multiple Pain Sites No                         [facility] Adult PT Treatment/Exercise - 11/07/16 0001      Ambulation/Gait   Gait Comments In parrellel bars walking with exagerated hip flexion. Ambualtion without upper extrmity support. No increase in pain with walkin. No loss of balance.      Knee/Hip Exercises: Standing   Heel Raises Limitations 2x10    Knee Flexion Limitations March 3x10 in mirror with verbal cues    Other Standing Knee Exercises mini squats 2x10 with cuing      Modalities   Modalities Cryotherapy     Cryotherapy   Number Minutes Cryotherapy 10 Minutes   Cryotherapy Location Knee   Type of  Cryotherapy Ice pack     Manual Therapy   Manual therapy comments PROM into flexion and extension> Pain noted with both stretches. Patellar mobilizations 4-way; Grade II and II PA and AP mobilizations to increae PROM                  PT Education - 11/07/16 1517    Education provided Yes   Education Details Importance of increasing strength, mobility, and stability    Person(s) Educated Patient   Methods Demonstration;Explanation;Tactile cues;Verbal cues   Comprehension Verbalized understanding;Returned demonstration;Tactile cues required;Need further instruction          PT Short Term Goals - 11/07/16 1524      PT SHORT TERM GOAL #1   Title pt will be I with basic/advanced HEP   Baseline minimal home exercise program from home health physical therapy   Time 4   Period Weeks   Status On-going     PT SHORT TERM GOAL #2   Title Patient will increase knee flexion by 20 degrees    Baseline 55 degrees of flexion    Time 4   Period Weeks   Status On-going     PT SHORT TERM GOAL #3   Title Patient will increase left knee extension by 5 degrees    Baseline 5 degrees from straight    Time 4   Period Weeks     PT SHORT TERM GOAL #4   Title Patient will increase gross left lower extremity strength to 5/5    Time 4   Period Weeks   Status On-going           PT Long Term Goals - 11/01/16 1406      PT LONG TERM GOAL #1   Title Patient will dmeostrate 90 degrees of knee flexion in order to get in and out of a chair    Baseline 55 degrees of flexion    Time 8   Period Weeks   Status On-going     PT LONG TERM GOAL #2   Title Patient will go up/down 4 steps with reciprocal gait    Baseline can not go up/down the steps with left leg lead    Time 8   Period Weeks   Status New     PT LONG TERM GOAL #3   Title Patient will walk 1000' with least restrictive device without significant pain    Baseline difficulty walking short distances  Time 8   Period Weeks    Status New     PT LONG TERM GOAL #4   Title paytiet will show a 57 % limitation on FOTO    Baseline 69% limitation    Time 8   Period Weeks   Status New               Plan - 11/07/16 1518    Clinical Impression Statement Patients PROM measured at 0-75 today. She had pain with end range stretching. She tolerated  standing xercises well. She is reporting hip pain with ambulation which is likley oming from her ldifficulty with hip flexion.    Rehab Potential Good   PT Frequency 2x / week   PT Duration 8 weeks   PT Treatment/Interventions ADLs/Self Care Home Management;Cryotherapy;Electrical Stimulation;Iontophoresis 4mg /ml Dexamethasone;Therapeutic activities;Therapeutic exercise;Neuromuscular re-education;Patient/family education;Dry needling;Manual techniques;Gait training;Ultrasound;Functional mobility training;Passive range of motion;Moist Heat;Taping;Splinting   PT Next Visit Plan Focus on regaining knee flexion; add standing exercises.  continue with ice, add exercise bike;    PT Home Exercise Plan stapr stretch, stretching at the edge of the table; quad sets, saq, laq    Consulted and Agree with Plan of Care Patient      Patient will benefit from skilled therapeutic intervention in order to improve the following deficits and impairments:  Abnormal gait, Difficulty walking, Pain, Decreased safety awareness, Decreased activity tolerance, Decreased range of motion, Decreased strength, Increased edema  Visit Diagnosis: Acute pain of left knee  Stiffness of left knee, not elsewhere classified  Muscle weakness (generalized)  Difficulty in walking, not elsewhere classified  Localized edema     Problem List Patient Active Problem List   Diagnosis Date Noted  . Total knee replacement status, left 09/27/2016  . S/P shoulder replacement 01/08/2015  . Acute renal failure ([place]) 08/02/2014  . Rhabdomyolysis 08/02/2014  . Hypotension 08/02/2014  . Lactic acidosis  08/02/2014  . Arthritis 08/02/2014  . Hyperlipidemia 08/02/2014  . Asthma 08/02/2014  . Chest pain 03/26/2014  . COPD (chronic obstructive pulmonary disease) ([place]) 03/25/2014  . Hypertension 03/25/2014  . Chest tightness or pressure 03/25/2014  . Supratherapeutic INR 03/25/2014  . Osteoarthritis 03/25/2014  . History of DVT (deep vein thrombosis) 03/25/2014    Carney Living PT DPT  11/07/2016, 3:34 PM  [facility] [address] [place], [place], [address] Phone: [phone]   Fax:  [phone]  Name: IVY SPRITZER MRN: [id number] Date of Birth: February 13, 1961

## 2016-11-15 ENCOUNTER — Ambulatory Visit: Payer: Medicaid Other | Admitting: Physical Therapy

## 2016-11-15 ENCOUNTER — Telehealth: Payer: Self-pay | Admitting: Physical Therapy

## 2016-11-15 NOTE — Telephone Encounter (Signed)
Spoke with patient regarding missed visit. Patient did not write the visit down on her calender. She does have her next visit written down.

## 2016-11-23 ENCOUNTER — Ambulatory Visit: Payer: Medicaid Other | Attending: Orthopedic Surgery | Admitting: Physical Therapy

## 2016-11-23 DIAGNOSIS — R262 Difficulty in walking, not elsewhere classified: Secondary | ICD-10-CM | POA: Diagnosis present

## 2016-11-23 DIAGNOSIS — M25562 Pain in left knee: Secondary | ICD-10-CM | POA: Insufficient documentation

## 2016-11-23 DIAGNOSIS — M6281 Muscle weakness (generalized): Secondary | ICD-10-CM | POA: Diagnosis present

## 2016-11-23 DIAGNOSIS — R600 Localized edema: Secondary | ICD-10-CM | POA: Insufficient documentation

## 2016-11-23 DIAGNOSIS — M25662 Stiffness of left knee, not elsewhere classified: Secondary | ICD-10-CM

## 2016-11-23 NOTE — Therapy (Signed)
[place], [place], [address] Phone: [phone]   Fax:  [phone]  Physical Therapy Treatment  Patient Details  Name: Amanda Christensen MRN: [id number] Date of Birth: [birth year] Referring Provider: Dr Kathryne Hitch   Encounter Date: 11/23/2016      PT End of Session - 11/23/16 1240    Visit Number 3   Number of Visits 5   Date for PT Re-Evaluation 12/27/16   PT Start Time 1147   PT Stop Time 9381   PT Time Calculation (min) 48 min   Activity Tolerance Patient tolerated treatment well   Behavior During Therapy [facility] for tasks assessed/performed      Past Medical History:  Diagnosis Date  . Arthritis   . Asthma   . Chronic pain   . COPD (chronic obstructive pulmonary disease) ([place])   . Depression    history of   . 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 Heart murmur   . History of anxiety    "used to have anxiety attacks"  . History of bronchitis   . Hypertension    takes Verapamil and Lisinopril  daily  . Insomnia    takes Trazodone nightly  . Obesity   . Peripheral vascular disease (H[facility]) 10   rt leg dvt   . Seizures ([place]) 03   was on medications none now off  since 2011  . Shortness of breath dyspnea     Past Surgical History:  Procedure Laterality Date  . BREAST LUMPECTOMY WITH NEEDLE LOCALIZATION Left 09/13/2016   Procedure: LEFT BREAST LUMPECTOMY WITH NEEDLE LOCALIZATION;  Surgeon: Clovis Riley, MD;  Location: [place];  Service: General;  Laterality: Left;  . CESAREAN SECTION    . COLONOSCOPY    . DILATION AND CURETTAGE OF UTERUS    . HERNIA REPAIR     umbilical  . JOINT REPLACEMENT    . miscarriage    . ROTATOR CUFF REPAIR Left   . TOTAL KNEE ARTHROPLASTY Right   . TOTAL KNEE ARTHROPLASTY Left 09/27/2016   Procedure: LEFT TOTAL KNEE ARTHROPLASTY;  Surgeon: [name], MD;  Location: [place];  Service: Orthopedics;  Laterality:  Left;  . TOTAL SHOULDER ARTHROPLASTY Left 01/08/2015   dr Veverly Fells  . TOTAL SHOULDER ARTHROPLASTY Left 01/08/2015   Procedure: LEFT TOTAL SHOULDER ARTHROPLASTY;  Surgeon: Augustin Schooling, MD;  Location: [place];  Service: Orthopedics;  Laterality: Left;    There were no vitals filed for this visit.      Subjective Assessment - 11/23/16 1157    Subjective See's MD the 30 th. Shje has been doing her exercises faithfully.  She has been picking up her feet with walking.  She was able to recite here whole exewrcise routine.   She went to MD and she is now taking a steroid Pack, I have noticed less swelling with the meds.    Currently in Pain? Yes   Pain Score 6   up to 8/10   Pain Location Knee   Pain Orientation Left   Pain Descriptors / Indicators Aching;Sore   Pain Type Surgical pain   Aggravating Factors  cold,  exercises   Pain Relieving Factors prednisone, oincreased muscle relaxers., rest            [facility] PT Assessment - 11/23/16 0001      PROM   Left Knee Extension 0   Left  Knee Flexion 75                     [facility] Adult PT Treatment/Exercise - 11/23/16 0001      Knee/Hip Exercises: Stretches   Quad Stretch 10 seconds  10 X standing step stretch   Gastroc Stretch 3 reps;30 seconds   Gastroc Stretch Limitations 2 on step both, 1 on floor left only   Other Knee/Hip Stretches bike, unable to make full r evolutions 6 minutes     Knee/Hip Exercises: Standing   Gait Training cues 30 feet X 10 arm swing heel strike, etc     Knee/Hip Exercises: Seated   Long Arc Quad 10 reps   Stool Scoot - Round Trips 10 feet, min assist on off for safetysa-q';     Knee/Hip Exercises: Supine   Quad Sets 10 reps;2 sets   Quad Sets Limitations POOR QUAD CONTRACTION,  USES GLUTEALS.,  contraction improved after education   Short Arc Quad Sets 1 set;10 reps   Heel Slides 10 reps   Heel Slides Limitations using strap     Manual Therapy   Manual therapy comments Strap  mobilization with movement for flexion to 75 degrees hard end feel,  patellar mobilizations.                PT Education - 11/23/16 1240    Education provided Yes   Education Details gait training, how quads need to work, how to [employer]) Educated Patient   Methods Explanation;Demonstration;Verbal cues   Comprehension Verbalized understanding;Returned demonstration          PT Short Term Goals - 11/23/16 1243      PT SHORT TERM GOAL #1   Title pt will be I with basic/advanced HEP   Baseline Has HEP memorized   Time 4   Period Weeks   Status Achieved     PT SHORT TERM GOAL #2   Title Patient will increase knee flexion by 20 degrees    Baseline PROM 75   Time 4   Period Weeks   Status On-going     PT SHORT TERM GOAL #3   Title Patient will increase left knee extension by 5 degrees    Baseline 0 degrees  PROM   Time 4   Period Weeks   Status On-going     PT SHORT TERM GOAL #4   Title Patient will increase gross left lower extremity strength to 5/5    Time 4   Period Weeks   Status Unable to assess           PT Long Term Goals - 11/01/16 1406      PT LONG TERM GOAL #1   Title Patient will dmeostrate 90 degrees of knee flexion in order to get in and out of a chair    Baseline 55 degrees of flexion    Time 8   Period Weeks   Status On-going     PT LONG TERM GOAL #2   Title Patient will go up/down 4 steps with reciprocal gait    Baseline can not go up/down the steps with left leg lead    Time 8   Period Weeks   Status New     PT LONG TERM GOAL #3   Title Patient will walk 1000' with least restrictive device without significant pain    Baseline difficulty walking short distances    Time 8   Period Weeks   Status  New     PT LONG TERM GOAL #4   Title paytiet will show a 57 % limitation on FOTO    Baseline 69% limitation    Time 8   Period Weeks   Status New               Plan - 11/23/16 1241    Clinical Impression  Statement PROM 75 flexion, full extension.  pain 5/10 post session.  She declined the need of modalities.  New meds may be helping.  No new goals met.   PT Next Visit Plan Focus on regaining knee flexion; add standing exercises.  continue with ice, continue bike   PT Home Exercise Plan step stretch, stretching at the edge of the table; quad sets, saq, laq    Consulted and Agree with Plan of Care Patient      Patient will benefit from skilled therapeutic intervention in order to improve the following deficits and impairments:  Abnormal gait, Difficulty walking, Pain, Decreased safety awareness, Decreased activity tolerance, Decreased range of motion, Decreased strength, Increased edema  Visit Diagnosis: Acute pain of left knee  Muscle weakness (generalized)  Stiffness of left knee, not elsewhere classified  Difficulty in walking, not elsewhere classified  Localized edema     Problem List Patient Active Problem List   Diagnosis Date Noted  . Total knee replacement status, left 09/27/2016  . S/P shoulder replacement 01/08/2015  . Acute renal failure ([place]) 08/02/2014  . Rhabdomyolysis 08/02/2014  . Hypotension 08/02/2014  . Lactic acidosis 08/02/2014  . Arthritis 08/02/2014  . Hyperlipidemia 08/02/2014  . Asthma 08/02/2014  . Chest pain 03/26/2014  . COPD (chronic obstructive pulmonary disease) ([place]) 03/25/2014  . Hypertension 03/25/2014  . Chest tightness or pressure 03/25/2014  . Supratherapeutic INR 03/25/2014  . Osteoarthritis 03/25/2014  . History of DVT (deep vein thrombosis) 03/25/2014    Tyrese Ficek  PTA 11/23/2016, 12:45 PM  [facility] [address] [place], [place], [address] Phone: [phone]   Fax:  [phone]  Name: Amanda Christensen MRN: [id number] Date of Birth: 03-30-1961

## 2016-11-24 ENCOUNTER — Encounter: Payer: Medicaid Other | Admitting: Physical Therapy

## 2016-11-30 ENCOUNTER — Ambulatory Visit: Payer: Medicaid Other | Admitting: Physical Therapy

## 2016-11-30 DIAGNOSIS — M25562 Pain in left knee: Secondary | ICD-10-CM

## 2016-11-30 DIAGNOSIS — M6281 Muscle weakness (generalized): Secondary | ICD-10-CM

## 2016-11-30 DIAGNOSIS — R600 Localized edema: Secondary | ICD-10-CM

## 2016-11-30 DIAGNOSIS — M25662 Stiffness of left knee, not elsewhere classified: Secondary | ICD-10-CM

## 2016-11-30 DIAGNOSIS — R262 Difficulty in walking, not elsewhere classified: Secondary | ICD-10-CM

## 2016-11-30 NOTE — Therapy (Signed)
[place], [place], [address] Phone: [phone]   Fax:  [phone]  Physical Therapy Treatment  Patient Details  Name: Amanda Christensen MRN: [id number] Date of Birth: 13-Aug-1961 Referring Provider: Dr Kathryne Hitch   Encounter Date: 11/30/2016      PT End of Session - 11/30/16 1034    Visit Number 4   Number of Visits 5   Date for PT Re-Evaluation 12/27/16   PT Start Time 0935   PT Stop Time 1037   PT Time Calculation (min) 62 min   Activity Tolerance Patient tolerated treatment well   Behavior During Therapy [facility] for tasks assessed/performed      Past Medical History:  Diagnosis Date  . Arthritis   . Asthma   . Chronic pain   . COPD (chronic obstructive pulmonary disease) ([place])   . Depression    history of   . 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 Heart murmur   . History of anxiety    "used to have anxiety attacks"  . History of bronchitis   . Hypertension    takes Verapamil and Lisinopril  daily  . Insomnia    takes Trazodone nightly  . Obesity   . Peripheral vascular disease ([facility]) 10   rt leg dvt   . Seizures ([place]) 03   was on medications none now off  since 2011  . Shortness of breath dyspnea     Past Surgical History:  Procedure Laterality Date  . BREAST LUMPECTOMY WITH NEEDLE LOCALIZATION Left 09/13/2016   Procedure: LEFT BREAST LUMPECTOMY WITH NEEDLE LOCALIZATION;  Surgeon: Clovis Riley, MD;  Location: [place];  Service: General;  Laterality: Left;  . CESAREAN SECTION    . COLONOSCOPY    . DILATION AND CURETTAGE OF UTERUS    . HERNIA REPAIR     umbilical  . JOINT REPLACEMENT    . miscarriage    . ROTATOR CUFF REPAIR Left   . TOTAL KNEE ARTHROPLASTY Right   . TOTAL KNEE ARTHROPLASTY Left 09/27/2016   Procedure: LEFT TOTAL KNEE ARTHROPLASTY;  Surgeon: [name], MD;  Location: [place];  Service: Orthopedics;  Laterality:  Left;  . TOTAL SHOULDER ARTHROPLASTY Left 01/08/2015   dr Veverly Fells  . TOTAL SHOULDER ARTHROPLASTY Left 01/08/2015   Procedure: LEFT TOTAL SHOULDER ARTHROPLASTY;  Surgeon: Augustin Schooling, MD;  Location: [place];  Service: Orthopedics;  Laterality: Left;    There were no vitals filed for this visit.      Subjective Assessment - 11/30/16 0941    Subjective 5/10 pain.  I  am walking smoother.  Steroid pack is done.  Edema reduced and started to return, it was helped again with a different medication.     Currently in Pain? Yes   Pain Score 5    Pain Orientation Left   Pain Descriptors / Indicators Aching;Sore  stiff   Pain Frequency Intermittent   Aggravating Factors  cold weather,  exercises   Pain Relieving Factors medication, manual    Multiple Pain Sites No            [facility] PT Assessment - 11/30/16 0001      Circumferential Edema   Circumferential - Left  53     PROM   Left Knee Flexion 90  with strap mobilization  [facility] Adult PT Treatment/Exercise - 11/30/16 0001      Knee/Hip Exercises: Stretches   Quad Stretch 5 reps  standing step stretch   Quad Stretch Limitations 8 inch step, cued for technique   Other Knee/Hip Stretches bike, for stretching. 5 minutes     Knee/Hip Exercises: Seated   Long Arc Quad 10 reps   Hamstring Curl 10 reps   Hamstring Limitations manual resistance     Knee/Hip Exercises: Supine   Heel Slides 1 set;10 reps   Heel Slides Limitations using strap     Modalities   Modalities Cryotherapy     Vasopneumatic   Number Minutes Vasopneumatic  15 minutes   Vasopnuematic Location  Knee   Vasopneumatic Pressure Low   Vasopneumatic Temperature  32  leg elevated     Manual Therapy   Manual therapy comments Mobilization for flexion grade s sstrap mobilization for flexion,  foam roller assisted, patellar mobssoft tissue work,  retrograde                  PT Short Term Goals - 11/30/16 1041       PT SHORT TERM GOAL #1   Title pt will be I with basic/advanced HEP   Time 4   Period Weeks   Status Achieved     PT SHORT TERM GOAL #2   Title Patient will increase knee flexion by 20 degrees    Baseline PROM 90   Time 4   Period Weeks   Status Achieved     PT SHORT TERM GOAL #3   Title Patient will increase left knee extension by 5 degrees    Baseline 0 degrees  PROM   Time 4   Period Weeks   Status On-going     PT SHORT TERM GOAL #4   Title Patient will increase gross left lower extremity strength to 5/5    Time 4   Period Weeks   Status Unable to assess           PT Long Term Goals - 11/01/16 1406      PT LONG TERM GOAL #1   Title Patient will dmeostrate 90 degrees of knee flexion in order to get in and out of a chair    Baseline 55 degrees of flexion    Time 8   Period Weeks   Status On-going     PT LONG TERM GOAL #2   Title Patient will go up/down 4 steps with reciprocal gait    Baseline can not go up/down the steps with left leg lead    Time 8   Period Weeks   Status New     PT LONG TERM GOAL #3   Title Patient will walk 1000' with least restrictive device without significant pain    Baseline difficulty walking short distances    Time 8   Period Weeks   Status New     PT LONG TERM GOAL #4   Title paytiet will show a 57 % limitation on FOTO    Baseline 69% limitation    Time 8   Period Weeks   Status New               Plan - 11/30/16 1037    Clinical Impression Statement PROM 90 flexion.  Patient thinks she may have felt an adhesion give away during manual stretches.  Edema reduced  to 53 cm. STG#2 MET.   PT Next Visit Plan Focus on regaining knee flexion;  add standing exercises.  continue with ice, continue bike.  Decide if a static stretch brace is needed.    PT Home Exercise Plan step stretch, stretching at the edge of the table; quad sets, saq, laq    Consulted and Agree with Plan of Care Patient      Patient will benefit from  skilled therapeutic intervention in order to improve the following deficits and impairments:  Abnormal gait, Difficulty walking, Pain, Decreased safety awareness, Decreased activity tolerance, Decreased range of motion, Decreased strength, Increased edema  Visit Diagnosis: Acute pain of left knee  Muscle weakness (generalized)  Stiffness of left knee, not elsewhere classified  Difficulty in walking, not elsewhere classified  Localized edema     Problem List Patient Active Problem List   Diagnosis Date Noted  . Total knee replacement status, left 09/27/2016  . S/P shoulder replacement 01/08/2015  . Acute renal failure ([place]) 08/02/2014  . Rhabdomyolysis 08/02/2014  . Hypotension 08/02/2014  . Lactic acidosis 08/02/2014  . Arthritis 08/02/2014  . Hyperlipidemia 08/02/2014  . Asthma 08/02/2014  . Chest pain 03/26/2014  . COPD (chronic obstructive pulmonary disease) ([place]) 03/25/2014  . Hypertension 03/25/2014  . Chest tightness or pressure 03/25/2014  . Supratherapeutic INR 03/25/2014  . Osteoarthritis 03/25/2014  . History of DVT (deep vein thrombosis) 03/25/2014    HARRIS,KAREN PTA 11/30/2016, 10:43 AM  [facility] [address] [place], [place], [address] Phone: [phone]   Fax:  [phone]  Name: Amanda Christensen MRN: [id number] Date of Birth: 01-06-1961

## 2016-12-07 ENCOUNTER — Ambulatory Visit: Payer: Medicaid Other | Admitting: Physical Therapy

## 2016-12-14 ENCOUNTER — Encounter: Payer: Self-pay | Admitting: Physical Therapy

## 2016-12-14 ENCOUNTER — Ambulatory Visit: Payer: Medicaid Other | Admitting: Physical Therapy

## 2016-12-14 DIAGNOSIS — M6281 Muscle weakness (generalized): Secondary | ICD-10-CM

## 2016-12-14 DIAGNOSIS — R600 Localized edema: Secondary | ICD-10-CM

## 2016-12-14 DIAGNOSIS — M25562 Pain in left knee: Secondary | ICD-10-CM | POA: Diagnosis not present

## 2016-12-14 DIAGNOSIS — M25662 Stiffness of left knee, not elsewhere classified: Secondary | ICD-10-CM

## 2016-12-14 DIAGNOSIS — R262 Difficulty in walking, not elsewhere classified: Secondary | ICD-10-CM

## 2016-12-14 NOTE — Therapy (Addendum)
[place], [place], [address] Phone: [phone]   Fax:  [phone]  Physical Therapy Treatment  Patient Details  Name: Amanda Christensen MRN: [id number] Date of Birth: October 27, 1961 Referring Provider: Dr Kathryne Hitch   Encounter Date: 12/14/2016      PT End of Session - 12/14/16 1106    Visit Number 5   Number of Visits 9   Date for PT Re-Evaluation 01/05/16   Authorization Type 8 visits from [facility]    PT Start Time 1100   PT Stop Time 1145   PT Time Calculation (min) 45 min   Activity Tolerance Patient tolerated treatment well   Behavior During Therapy [facility] for tasks assessed/performed      Past Medical History:  Diagnosis Date  . Arthritis   . Asthma   . Chronic pain   . COPD (chronic obstructive pulmonary disease) ([place])   . Depression    history of   . 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 Heart murmur   . History of anxiety    "used to have anxiety attacks"  . History of bronchitis   . Hypertension    takes Verapamil and Lisinopril  daily  . Insomnia    takes Trazodone nightly  . Obesity   . Peripheral vascular disease (HCC) 10   rt leg dvt   . Seizures ([place]) 03   was on medications none now off  since 2011  . Shortness of breath dyspnea     Past Surgical History:  Procedure Laterality Date  . BREAST LUMPECTOMY WITH NEEDLE LOCALIZATION Left 09/13/2016   Procedure: LEFT BREAST LUMPECTOMY WITH NEEDLE LOCALIZATION;  Surgeon: Clovis Riley, MD;  Location: [place];  Service: General;  Laterality: Left;  . CESAREAN SECTION    . COLONOSCOPY    . DILATION AND CURETTAGE OF UTERUS    . HERNIA REPAIR     umbilical  . JOINT REPLACEMENT    . miscarriage    . ROTATOR CUFF REPAIR Left   . TOTAL KNEE ARTHROPLASTY Right   . TOTAL KNEE ARTHROPLASTY Left 09/27/2016   Procedure: LEFT TOTAL KNEE ARTHROPLASTY;  Surgeon: [name], MD;   Location: [place];  Service: Orthopedics;  Laterality: Left;  . TOTAL SHOULDER ARTHROPLASTY Left 01/08/2015   dr Veverly Fells  . TOTAL SHOULDER ARTHROPLASTY Left 01/08/2015   Procedure: LEFT TOTAL SHOULDER ARTHROPLASTY;  Surgeon: Augustin Schooling, MD;  Location: [place];  Service: Orthopedics;  Laterality: Left;    There were no vitals filed for this visit.      Subjective Assessment - 12/14/16 1101    Subjective Patient reports that since sahe stopped taking the steroid pack she feels like herswelling has come back. She feels like her knee is tight. She reportds her pain is undercontrol today but she feels stiff.    Limitations Walking;Standing;Other (comment)   How long can you stand comfortably? < 10 minutes    How long can you walk comfortably? household distances before pain increases   Diagnostic tests Nothing post op    Patient Stated Goals to have less pain in her left leg and back with daily activity.    Currently in Pain? Yes   Pain Score 5    Pain Location Knee   Pain Orientation Left   Pain Descriptors / Indicators Aching;Sore   Pain Type Surgical pain   Pain Onset More than a  month ago   Pain Frequency Constant   Aggravating Factors  cold weather, activity    Pain Relieving Factors medication    Effect of Pain on Daily Activities difficulty perfroming daily activity             [facility] PT Assessment - 12/14/16 0001      PROM   Left Knee Extension 0   Left Knee Flexion 83                     [facility] Adult PT Treatment/Exercise - 12/14/16 0001      Knee/Hip Exercises: Stretches   Other Knee/Hip Stretches bike, for stretching. 5 minutes     Knee/Hip Exercises: Seated   Stool Scoot - Round Trips 10 feet, min assist on off for safetysa-q';   Hamstring Curl 10 reps   Hamstring Limitations manual resistance     Knee/Hip Exercises: Supine   Short Arc Quad Sets Limitations 2x10   Heel Slides 1 set;10 reps   Heel Slides Limitations using strap     Modalities    Modalities Cryotherapy     Cryotherapy   Number Minutes Cryotherapy 10 Minutes   Cryotherapy Location Knee   Type of Cryotherapy Ice pack     Manual Therapy   Manual therapy comments Mobilization for flexion grade  strap mobilization for flexion,  foam roller assisted, patellar mobssoft tissue work,                  PT Education - 12/14/16 1103    Education provided Yes   Education Details continue with gait training    Person(s) Educated Patient   Methods Explanation;Demonstration;Verbal cues   Comprehension Verbalized understanding          PT Short Term Goals - 11/30/16 1041      PT SHORT TERM GOAL #1   Title pt will be I with basic/advanced HEP   Time 4   Period Weeks   Status Achieved     PT SHORT TERM GOAL #2   Title Patient will increase knee flexion by 20 degrees    Baseline PROM 90   Time 4   Period Weeks   Status Achieved     PT SHORT TERM GOAL #3   Title Patient will increase left knee extension by 5 degrees    Baseline 0 degrees  PROM   Time 4   Period Weeks   Status On-going     PT SHORT TERM GOAL #4   Title Patient will increase gross left lower extremity strength to 5/5    Time 4   Period Weeks   Status Unable to assess           PT Long Term Goals - 11/01/16 1406      PT LONG TERM GOAL #1   Title Patient will dmeostrate 90 degrees of knee flexion in order to get in and out of a chair    Baseline 55 degrees of flexion    Time 8   Period Weeks   Status On-going     PT LONG TERM GOAL #2   Title Patient will go up/down 4 steps with reciprocal gait    Baseline can not go up/down the steps with left leg lead    Time 8   Period Weeks   Status New     PT LONG TERM GOAL #3   Title Patient will walk 1000' with least restrictive device without significant pain  Baseline difficulty walking short distances    Time 8   Period Weeks   Status New     PT LONG TERM GOAL #4   Title paytiet will show a 57 % limitation on FOTO     Baseline 69% limitation    Time 8   Period Weeks   Status New               Plan - 12/14/16 1139    Clinical Impression Statement Patient has not been seen for 2 weeks nd to snow. She has bectracked with her range of motion. She reports it has been swelling recently. Therapy educated her on te improtance of increaseing strength. She was strongly encouraged to try to do her exercises 2x per day to imporove motion and strength. No new goals met.    Rehab Potential Good   PT Frequency 2x / week   PT Duration 8 weeks   PT Treatment/Interventions ADLs/Self Care Home Management;Cryotherapy;Electrical Stimulation;Iontophoresis 40m/ml Dexamethasone;Therapeutic activities;Therapeutic exercise;Neuromuscular re-education;Patient/family education;Dry needling;Manual techniques;Gait training;Ultrasound;Functional mobility training;Passive range of motion;Moist Heat;Taping;Splinting   PT Next Visit Plan Focus on regaining knee flexion; add standing exercises.  continue with ice, continue bike.  Decide if a static stretch brace is needed.    PT Home Exercise Plan step stretch, stretching at the edge of the table; quad sets, saq, laq    Consulted and Agree with Plan of Care Patient      Patient will benefit from skilled therapeutic intervention in order to improve the following deficits and impairments:  Abnormal gait, Difficulty walking, Pain, Decreased safety awareness, Decreased activity tolerance, Decreased range of motion, Decreased strength, Increased edema  Visit Diagnosis: Acute pain of left knee  Muscle weakness (generalized)  Stiffness of left knee, not elsewhere classified  Difficulty in walking, not elsewhere classified  Localized edema    PHYSICAL THERAPY DISCHARGE SUMMARY  Visits from Start of Care: 5  Current functional level related to goals / functional outcomes: Patient did not return for the rest of her approved visit. Therapy did get her a JAS splint   Remaining  deficits: Continued stiffness of the knee   Education / Equipment: HEP Plan: Patient agrees to discharge.  Patient goals were not met. Patient is being discharged due to not returning since the last visit.  ?????      Problem List Patient Active Problem List   Diagnosis Date Noted  . Total knee replacement status, left 09/27/2016  . S/P shoulder replacement 01/08/2015  . Acute renal failure ([place] 08/02/2014  . Rhabdomyolysis 08/02/2014  . Hypotension 08/02/2014  . Lactic acidosis 08/02/2014  . Arthritis 08/02/2014  . Hyperlipidemia 08/02/2014  . Asthma 08/02/2014  . Chest pain 03/26/2014  . COPD (chronic obstructive pulmonary disease) ([place] 03/25/2014  . Hypertension 03/25/2014  . Chest tightness or pressure 03/25/2014  . Supratherapeutic INR 03/25/2014  . Osteoarthritis 03/25/2014  . History of DVT (deep vein thrombosis) 03/25/2014    DCarney Living1/25/2018, 3:20 PM  [facility]GBall[address] [place] [address]Phone: [phone]  Fax:  [phone] Name: AEASTYN SKALLAMRN: [id number] of Birth: [birth year]

## 2016-12-21 ENCOUNTER — Ambulatory Visit: Payer: Medicaid Other | Admitting: Physical Therapy

## 2016-12-28 ENCOUNTER — Ambulatory Visit: Payer: Medicaid Other | Admitting: Physical Therapy

## 2016-12-29 ENCOUNTER — Ambulatory Visit: Payer: Medicaid Other | Admitting: Physical Therapy

## 2017-01-04 ENCOUNTER — Encounter: Payer: Medicaid Other | Admitting: Physical Therapy

## 2017-07-04 ENCOUNTER — Other Ambulatory Visit: Payer: Self-pay | Admitting: Orthopedic Surgery

## 2017-07-04 DIAGNOSIS — M47817 Spondylosis without myelopathy or radiculopathy, lumbosacral region: Secondary | ICD-10-CM

## 2017-07-12 ENCOUNTER — Ambulatory Visit
Admission: RE | Admit: 2017-07-12 | Discharge: 2017-07-12 | Disposition: A | Discharge status: Home / Self Care | Payer: Medicaid Other | Source: Ambulatory Visit | Attending: Orthopedic Surgery | Admitting: Orthopedic Surgery

## 2017-07-12 DIAGNOSIS — M47817 Spondylosis without myelopathy or radiculopathy, lumbosacral region: Secondary | ICD-10-CM

## 2017-07-12 MED ORDER — IOPAMIDOL (ISOVUE-M 200) INJECTION 41%
1.0000 mL | Freq: Once | INTRAMUSCULAR | Status: AC
  Administered 2017-07-12: 1 mL via EPIDURAL

## 2017-07-12 MED ORDER — METHYLPREDNISOLONE ACETATE 40 MG/ML INJ SUSP (RADIOLOG
120.0000 mg | Freq: Once | INTRAMUSCULAR | Status: AC
  Administered 2017-07-12: 120 mg via EPIDURAL

## 2017-07-12 NOTE — Discharge Instructions (Signed)

## 2017-07-18 ENCOUNTER — Emergency Department (HOSPITAL_COMMUNITY)
Admission: EM | Admit: 2017-07-18 | Discharge: 2017-07-18 | Disposition: A | Discharge status: Home / Self Care | Payer: Medicaid Other | Attending: Emergency Medicine | Admitting: Emergency Medicine

## 2017-07-18 ENCOUNTER — Encounter (HOSPITAL_COMMUNITY): Payer: Self-pay | Admitting: *Deleted

## 2017-07-18 DIAGNOSIS — M62838 Other muscle spasm: Secondary | ICD-10-CM | POA: Diagnosis not present

## 2017-07-18 DIAGNOSIS — Z87891 Personal history of nicotine dependence: Secondary | ICD-10-CM | POA: Insufficient documentation

## 2017-07-18 DIAGNOSIS — I10 Essential (primary) hypertension: Secondary | ICD-10-CM | POA: Diagnosis not present

## 2017-07-18 DIAGNOSIS — Z7901 Long term (current) use of anticoagulants: Secondary | ICD-10-CM | POA: Insufficient documentation

## 2017-07-18 DIAGNOSIS — J45909 Unspecified asthma, uncomplicated: Secondary | ICD-10-CM | POA: Insufficient documentation

## 2017-07-18 DIAGNOSIS — R748 Abnormal levels of other serum enzymes: Secondary | ICD-10-CM | POA: Diagnosis not present

## 2017-07-18 DIAGNOSIS — Z79899 Other long term (current) drug therapy: Secondary | ICD-10-CM | POA: Insufficient documentation

## 2017-07-18 DIAGNOSIS — J449 Chronic obstructive pulmonary disease, unspecified: Secondary | ICD-10-CM | POA: Insufficient documentation

## 2017-07-18 DIAGNOSIS — Z96652 Presence of left artificial knee joint: Secondary | ICD-10-CM | POA: Insufficient documentation

## 2017-07-18 LAB — BASIC METABOLIC PANEL
Anion gap: 11 (ref 5–15)
BUN: 18 mg/dL (ref 6–20)
CALCIUM: 10.1 mg/dL (ref 8.9–10.3)
CO2: 27 mmol/L (ref 22–32)
CREATININE: 1.06 mg/dL — AB (ref 0.44–1.00)
Chloride: 100 mmol/L — ABNORMAL LOW (ref 101–111)
GFR calc non Af Amer: 57 mL/min — ABNORMAL LOW (ref >60)
Glucose, Bld: 104 mg/dL — ABNORMAL HIGH (ref 65–99)
Potassium: 4.2 mmol/L (ref 3.5–5.1)
SODIUM: 138 mmol/L (ref 135–145)

## 2017-07-18 MED ORDER — LORAZEPAM 2 MG/ML IJ SOLN
1.0000 mg | Freq: Once | INTRAMUSCULAR | Status: AC
Start: 2017-07-18 — End: 2017-07-18
  Administered 2017-07-18: 1 mg via INTRAVENOUS
  Filled 2017-07-18: qty 1

## 2017-07-18 MED ORDER — LORAZEPAM 2 MG/ML IJ SOLN
1.0000 mg | Freq: Once | INTRAMUSCULAR | Status: AC
  Administered 2017-07-18: 1 mg via INTRAVENOUS
  Filled 2017-07-18: qty 1

## 2017-07-18 NOTE — ED Notes (Addendum)
Pt able to ambulate to wheelchair. Alert and oriented x 4, though drowsy.

## 2017-07-18 NOTE — ED Notes (Signed)
Brought patient back to room via wheelchair.  Patient states she can not walk due to pain and spasms.  Patient ambulated from wheelchair to exam chair without difficulty when I left room and looked through door window.

## 2017-07-18 NOTE — Discharge Instructions (Signed)
You need to have your kidney function checked in the next 2 weeks

## 2017-07-18 NOTE — ED Provider Notes (Signed)
[facility] DEPT Provider Note   CSN: [id number] Arrival date & time: 07/18/17  1323     History   Chief Complaint Chief Complaint  Patient presents with  . Spasms    HPI Amanda Christensen is a 56 y.o. female.  Pt comes in with c/o spasms all over her body.she states that it started this morning and the symptoms are not relieved by flexeril. She has had this happen before. No fever, injury, numbness or weakness. She states that it goes from her abdomen, to her back, to her hands and the symptoms have not gotten any better.      Past Medical History:  Diagnosis Date  . Arthritis   . Asthma   . Chronic pain   . COPD (chronic obstructive pulmonary disease) ([place])   . Depression    history of   . 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 Heart murmur   . History of anxiety    "used to have anxiety attacks"  . History of bronchitis   . Hypertension    takes Verapamil and Lisinopril  daily  . Insomnia    takes Trazodone nightly  . Obesity   . Peripheral vascular disease ([facility]) 10   rt leg dvt   . Seizures ([place]) 03   was on medications none now off  since 2011  . Shortness of breath dyspnea     Patient Active Problem List   Diagnosis Date Noted  . Total knee replacement status, left 09/27/2016  . S/P shoulder replacement 01/08/2015  . Acute renal failure ([place]) 08/02/2014  . Rhabdomyolysis 08/02/2014  . Hypotension 08/02/2014  . Lactic acidosis 08/02/2014  . Arthritis 08/02/2014  . Hyperlipidemia 08/02/2014  . Asthma 08/02/2014  . Chest pain 03/26/2014  . COPD (chronic obstructive pulmonary disease) ([place]) 03/25/2014  . Hypertension 03/25/2014  . Chest tightness or pressure 03/25/2014  . Supratherapeutic INR 03/25/2014  . Osteoarthritis 03/25/2014  . History of DVT (deep vein thrombosis) 03/25/2014    Past Surgical History:  Procedure Laterality Date  . BREAST LUMPECTOMY WITH NEEDLE LOCALIZATION Left 09/13/2016     Procedure: LEFT BREAST LUMPECTOMY WITH NEEDLE LOCALIZATION;  Surgeon: Clovis Riley, MD;  Location: [place];  Service: General;  Laterality: Left;  . CESAREAN SECTION    . COLONOSCOPY    . DILATION AND CURETTAGE OF UTERUS    . HERNIA REPAIR     umbilical  . JOINT REPLACEMENT    . miscarriage    . ROTATOR CUFF REPAIR Left   . TOTAL KNEE ARTHROPLASTY Right   . TOTAL KNEE ARTHROPLASTY Left 09/27/2016   Procedure: LEFT TOTAL KNEE ARTHROPLASTY;  Surgeon: [name], MD;  Location: [place];  Service: Orthopedics;  Laterality: Left;  . TOTAL SHOULDER ARTHROPLASTY Left 01/08/2015   dr Veverly Fells  . TOTAL SHOULDER ARTHROPLASTY Left 01/08/2015   Procedure: LEFT TOTAL SHOULDER ARTHROPLASTY;  Surgeon: Augustin Schooling, MD;  Location: [place];  Service: Orthopedics;  Laterality: Left;    OB History    No data available       Home Medications    Prior to Admission medications   Medication Sig Start Date End Date Taking? Authorizing Provider  albuterol (PROVENTIL HFA;VENTOLIN HFA) 108 (90 BASE) MCG/ACT inhaler Inhale 1-2 puffs into the lungs every 6 (six) hours as needed for wheezing or shortness of breath. 12/15/14   Quintella Reichert, MD  apixaban (ELIQUIS) 2.5 MG TABS tablet Take  1 tab po q12 hours x 14 days following surgery to prevent blood clots 09/27/16   Aundra Dubin, PA-C  Ascorbic Acid (VITAMIN C PO) Take 1 tablet by mouth at bedtime.     [provider]  cetirizine (ZYRTEC) 10 MG tablet Take 10 mg by mouth at bedtime.     [provider]  Cholecalciferol (VITAMIN D PO) Take 1 tablet by mouth at bedtime.     [provider]  diclofenac sodium (VOLTAREN) 1 % GEL Apply 2 g topically 4 (four) times daily. 09/28/15   Joy, Shawn C, PA-C  fluticasone (FLONASE) 50 MCG/ACT nasal spray Place 1 spray into both nostrils daily as needed for allergies or rhinitis.    [provider]  Fluticasone-Salmeterol (ADVAIR) 250-50 MCG/DOSE AEPB Inhale 1 puff into the lungs 2  (two) times daily.    [provider]  Glycerin-Polysorbate 80 (REFRESH DRY EYE THERAPY OP) Apply 1 drop to eye daily as needed (dry eyes).    [provider]  HYDROmorphone (DILAUDID) 2 MG tablet Take 1-2 tabs po q4-6 hours prn pain 09/27/16   Aundra Dubin, PA-C  lisinopril (PRINIVIL,ZESTRIL) 20 MG tablet Take 20 mg by mouth daily.    [provider]  methocarbamol (ROBAXIN) 500 MG tablet Take 1 tablet (500 mg total) by mouth 3 (three) times daily as needed. 01/08/15   Netta Cedars, MD  montelukast (SINGULAIR) 10 MG tablet Take 10 mg by mouth at bedtime.    [provider]  ondansetron (ZOFRAN) 4 MG tablet Take 1 tablet (4 mg total) by mouth every 8 (eight) hours as needed for nausea or vomiting. 09/27/16   Aundra Dubin, PA-C  RESTASIS MULTIDOSE 0.05 % ophthalmic emulsion Place 1 drop into both eyes daily. 08/14/16   [provider]  traZODone (DESYREL) 100 MG tablet Take 100 mg by mouth at bedtime.    [provider]  triamcinolone cream (KENALOG) 0.1 % Apply 1 application topically 2 (two) times daily.    [provider]  verapamil (VERELAN PM) 240 MG 24 hr capsule Take 240 mg by mouth at bedtime.     [provider]    Family History Family History  Problem Relation Age of Onset  . Cancer Father   . Hypertension Unknown        family history  . Cancer Unknown        family history  . Drug abuse Unknown        family history  . Other Unknown        family history of psychiatric problems, disabilities    Social History Social History  Substance Use Topics  . Smoking status: Former Smoker    Packs/day: 0.50    Years: 17.00    Types: Cigarettes    Quit date: 11/20/2012  . Smokeless tobacco: Former Systems developer  . Alcohol use No     Allergies   Penicillins; Tomato; and Oxycodone   Review of Systems Review of Systems  All other systems reviewed and are negative.    Physical Exam Updated Vital Signs BP  122/73 (BP Location: Right Arm)   Pulse 89   Temp 98.4 F (36.9 C) (Oral)   Resp 20   SpO2 100%   Physical Exam  Constitutional: She is oriented to person, place, and time. She appears well-developed and well-nourished.  HENT:  Head: Normocephalic and atraumatic.  Eyes: Pupils are equal, round, and reactive to light.  Neck: Normal range of motion.  Neck supple.  Cardiovascular: Normal rate.   Pulmonary/Chest: Effort normal.  Abdominal: Soft. Bowel sounds are normal.  Neurological: She is alert and oriented to person, place, and time.  Skin: Skin is warm and dry.  Psychiatric:  tearful  Nursing note and vitals reviewed.    ED Treatments / Results  Labs (all labs ordered are listed, but only abnormal results are displayed) Labs Reviewed  BASIC METABOLIC PANEL    EKG  EKG Interpretation None       Radiology No results found.  Procedures Procedures (including critical care time)  Medications Ordered in ED Medications  LORazepam (ATIVAN) injection 1 mg (not administered)     Initial Impression / Assessment and Plan / ED Course  I have reviewed the triage vital signs and the nursing notes.  Pertinent labs & imaging results that were available during my care of the patient were reviewed by me and considered in my medical decision making (see chart for details).     4:08 PM. Pt is feeling a little better. Will given 1 more dose of ativan. No visible sign of cramping or spasms. Lab work has slight increase in cr. Will have pt check with pcp 4:26 PM Pt feeling better at this time Final Clinical Impressions(s) / ED Diagnoses   Final diagnoses:  None    New Prescriptions New Prescriptions   No medications on file     Glendell Docker, NP 07/18/17 [place]    Fredia Sorrow, MD 07/18/17 1714

## 2017-07-18 NOTE — ED Notes (Signed)
Pt states spasms only mildly improved.  PA notified.

## 2017-07-18 NOTE — ED Triage Notes (Signed)
Pt reports muscle spasms and cramping to entire body that started yesterday. Started in her legs and is now to her shoulders, back and torso. Hx of same occurring in past but does not know the cause.

## 2017-07-19 ENCOUNTER — Telehealth: Payer: Self-pay | Admitting: Radiology

## 2017-07-19 NOTE — Telephone Encounter (Signed)
Pt had lumbar injection on 07/12/17 and is now c/o cramps all over her body. Explained that leg cramps were not unusual but cramps in her shoulders and upper back should not be coming from her injection. Told her that yellow mustard or tonic water might help with the leg cramps.

## 2017-09-06 ENCOUNTER — Encounter: Payer: Self-pay | Admitting: Physical Therapy

## 2017-09-06 ENCOUNTER — Ambulatory Visit: Payer: Medicaid Other | Attending: Neurosurgery | Admitting: Physical Therapy

## 2017-09-06 DIAGNOSIS — R2689 Other abnormalities of gait and mobility: Secondary | ICD-10-CM

## 2017-09-06 DIAGNOSIS — G8929 Other chronic pain: Secondary | ICD-10-CM | POA: Diagnosis present

## 2017-09-06 DIAGNOSIS — M6281 Muscle weakness (generalized): Secondary | ICD-10-CM | POA: Diagnosis present

## 2017-09-06 DIAGNOSIS — R293 Abnormal posture: Secondary | ICD-10-CM | POA: Insufficient documentation

## 2017-09-06 DIAGNOSIS — M545 Low back pain: Secondary | ICD-10-CM | POA: Insufficient documentation

## 2017-09-06 DIAGNOSIS — M62830 Muscle spasm of back: Secondary | ICD-10-CM

## 2017-09-06 NOTE — Therapy (Signed)
[place], [place], [address] Phone: [phone]   Fax:  [phone]  Physical Therapy Evaluation  Patient Details  Name: Amanda Christensen MRN: [id number] Date of Birth: [birth year] Referring Provider: Kary Kos MD  Encounter Date: 09/06/2017      PT End of Session - 09/06/17 1255    Visit Number 1   Number of Visits 13   Date for PT Re-Evaluation 11/01/17   Authorization Type MCD: re-evaluating by 4th visit   PT Start Time 4097   PT Stop Time 1231   PT Time Calculation (min) 46 min   Activity Tolerance Patient tolerated treatment well   Behavior During Therapy [facility] for tasks assessed/performed      Past Medical History:  Diagnosis Date  . Arthritis   . Asthma   . Chronic pain   . COPD (chronic obstructive pulmonary disease) ([place])   . Depression    history of   . 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 Heart murmur   . History of anxiety    "used to have anxiety attacks"  . History of bronchitis   . Hypertension    takes Verapamil and Lisinopril  daily  . Insomnia    takes Trazodone nightly  . Obesity   . Peripheral vascular disease (HCC) 10   rt leg dvt   . Seizures ([place]) 03   was on medications none now off  since 2011  . Shortness of breath dyspnea     Past Surgical History:  Procedure Laterality Date  . BREAST LUMPECTOMY WITH NEEDLE LOCALIZATION Left 09/13/2016   Procedure: LEFT BREAST LUMPECTOMY WITH NEEDLE LOCALIZATION;  Surgeon: Clovis Riley, MD;  Location: [place];  Service: General;  Laterality: Left;  . CESAREAN SECTION    . COLONOSCOPY    . DILATION AND CURETTAGE OF UTERUS    . HERNIA REPAIR     umbilical  . JOINT REPLACEMENT    . miscarriage    . ROTATOR CUFF REPAIR Left   . TOTAL KNEE ARTHROPLASTY Right   . TOTAL KNEE ARTHROPLASTY Left 09/27/2016   Procedure: LEFT TOTAL KNEE ARTHROPLASTY;  Surgeon: [name], MD;   Location: [place];  Service: Orthopedics;  Laterality: Left;  . TOTAL SHOULDER ARTHROPLASTY Left 01/08/2015   dr Veverly Fells  . TOTAL SHOULDER ARTHROPLASTY Left 01/08/2015   Procedure: LEFT TOTAL SHOULDER ARTHROPLASTY;  Surgeon: Augustin Schooling, MD;  Location: [place];  Service: Orthopedics;  Laterality: Left;    There were no vitals filed for this visit.       Subjective Assessment - 09/06/17 1155    Subjective pt is a 56 y.o F with CC of low back pain that has been going on for years with no specific MOI that has recently worsened over the last few months. Pain starts in the back and just started going down the RLE to the knee in the last few days. pt denies red flags.    Limitations House hold activities  stairs   How long can you sit comfortably? 5-10 min   How long can you stand comfortably? 10 min   How long can you walk comfortably? 10 min   Diagnostic tests x-ray   Patient Stated Goals decrease pain, improve motion, know what to do when it hurts   Currently in Pain? Yes   Pain Score 6   took meds for pain 15 min  prior to evaluation, at worst 9/10   Pain Location Back   Pain Orientation Mid;Lower   Pain Descriptors / Indicators Sharp;Stabbing;Tightness;Aching   Pain Type Chronic pain   Pain Radiating Towards down the RLE to hip   Pain Onset More than a month ago   Pain Frequency Intermittent   Aggravating Factors  bending, prolonged walking/ standing, getting out of bed in the morning,    Pain Relieving Factors medication, heating pad,    Effect of Pain on Daily Activities limited endurnace with CKC activities.             [facility] PT Assessment - 09/06/17 1203      Assessment   Medical Diagnosis spinal stenosis   Referring Provider Kary Kos MD   Onset Date/Surgical Date --  a few years   Hand Dominance Right   Next MD Visit --  4 weeks   Prior Therapy yes   for knee     Precautions   Precautions None     Restrictions   Weight Bearing Restrictions No     Balance  Screen   Has the patient fallen in the past 6 months No   Has the patient had a decrease in activity level because of a fear of falling?  No   Is the patient reluctant to leave their home because of a fear of falling?  No     Home Environment   Living Environment Private residence   Living Arrangements Alone   Type of [place] to enter   Entrance Stairs-Number of Steps 3   Entrance Stairs-Rails Right   Home Layout One level     Prior Function   Level of Independence Independent with basic ADLs   Vocation On disability   Leisure knitting, seniors class     Cognition   Overall Cognitive Status Within Functional Limits for tasks assessed     Posture/Postural Control   Posture/Postural Control Postural limitations   Postural Limitations Rounded Shoulders;Forward head;Increased lumbar lordosis     ROM / Strength   AROM / PROM / Strength AROM;Strength     AROM   AROM Assessment Site Lumbar   Lumbar Flexion 40  pain coming back to nuetral   Lumbar Extension 10  PDM   Lumbar - Right Side Bend 12  PDM   Lumbar - Left Side Bend 12  PDM     Strength   Strength Assessment Site Hip;Knee   Right/Left Hip Right;Left   Right Hip Flexion 3+/5   Right Hip Extension 3+/5   Right Hip ABduction 3+/5   Right Hip ADduction 3+/5   Left Hip Flexion 3+/5   Left Hip Extension 3+/5   Left Hip ABduction 3+/5   Left Hip ADduction 3+/5   Right/Left Knee Right;Left   Right Knee Flexion 4-/5   Right Knee Extension 4/5   Left Knee Flexion 4-/5  increased pain in the back during testing   Left Knee Extension 4/5            Objective measurements completed on examination: See above findings.                  PT Education - 09/06/17 1254    Education provided Yes   Education Details evaluation findings, POC, goals, HEP with form/ rationale.    Person(s) Educated Patient   Methods Explanation;Verbal cues;Handout   Comprehension Verbalized  understanding;Verbal cues required  PT Short Term Goals - 09/06/17 1301      PT SHORT TERM GOAL #1   Title pt to be I with inital HEP    Baseline no previous HEP for low back   Time 3   Period Weeks   Status New   Target Date 09/27/17     PT SHORT TERM GOAL #2   Title pt to verbalize and demo proper posture with standing/ walking and proper form with liftting/carrying mechanics to prevent and reduce low back pain   Baseline no knowledge    Time 3   Period Weeks   Status New   Target Date 09/27/17     PT SHORT TERM GOAL #3   Title demonstarte decreased muscle spasm in the low back reduce pain to </= 6/10 at worst and promote trunk mobility    Baseline pain 8/10 (at worst) with limited mobility    Time 3   Period Weeks   Status New           PT Long Term Goals - 09/06/17 1306      PT LONG TERM GOAL #1   Title pt to increase trunk flexion to >/= 60 degrees and extension / bil sidebending to >/= 18 degrees with </= 3/10 pain for functional mobility required for ADLs   Baseline flexion 40 , extension 10, bil side bending 12 degrees with 6/10 pain    Time 6   Period Weeks   Status New   Target Date 10/18/17     PT LONG TERM GOAL #2   Title pt to increase bil hip abductors/ extensors to >/= 4/5 for stability and assist with posture with lifting / carrying activities    Baseline hip abduction/ extensors 3+/5    Time 6   Period Weeks   Status New   Target Date 10/18/17     PT LONG TERM GOAL #3   Title pt to be able to sit / stand and walk for >/= 30 min and navigate 10 steps reciprocally with </=3/10 pain for functional community ambulate    Baseline sit 5-10 min, standing/ walking 10 min   Time 6   Period Weeks   Status New   Target Date 10/18/17     PT LONG TERM GOAL #4   Title pt to be I with all HEP given as of last visit   Baseline no previous HEP for back   Time 6   Period Weeks   Status New   Target Date 10/18/17                 Plan - 09/06/17 1256    Clinical Impression Statement pt presents to OPPT with CC of chronic low back pain with no specific MOI and recent RLE referral to the knee. limited trunk mobility and general weakness in bil LE. TTP along bil lumbar paraspinals with L>R. pt demonstrates difficulty with bed mobiltiy with supine <> sit transitions. She would benefit from physical therapy to decrease muscle spasm, reduce pain, improve functional mobility, and maximize her function by addressing the deficits listed.    History and Personal Factors relevant to plan of care: hx of TKA, depression and DVT   Clinical Presentation Evolving   Clinical Presentation due to: limited trunk mobility, weakness in bil LE, recent N/T into RLE   Clinical Decision Making Moderate   Rehab Potential Good   PT Frequency 1x / week   PT Duration 3 weeks  progressing to 2  x week for 3 weeks, following 1st month   PT Treatment/Interventions ADLs/Self Care Home Management;Cryotherapy;Electrical Stimulation;Iontophoresis 4mg /ml Dexamethasone;Moist Heat;Ultrasound;Therapeutic activities;Therapeutic exercise;Dry needling;Taping;Manual techniques;Neuromuscular re-education;Passive range of motion;Patient/family education;Gait training   PT Next Visit Plan review and update HEP, core strengthening, hip strengthening, trunk mobility, modalities PRN, assess pelvic alignment, teach bed mobility   PT Home Exercise Plan lower trunk rotation, supine marching, supine clamshells, hamstring stretch   Consulted and Agree with Plan of Care Patient      Patient will benefit from skilled therapeutic intervention in order to improve the following deficits and impairments:  Pain, Improper body mechanics, Postural dysfunction, Decreased range of motion, Decreased endurance, Decreased activity tolerance, Decreased strength, Abnormal gait, Increased fascial restricitons, Decreased balance  Visit Diagnosis: Chronic left-sided low back pain, with sciatica  presence unspecified - Plan: PT plan of care cert/re-cert  Abnormal posture - Plan: PT plan of care cert/re-cert  Muscle weakness (generalized) - Plan: PT plan of care cert/re-cert  Other abnormalities of gait and mobility - Plan: PT plan of care cert/re-cert  Muscle spasm of back - Plan: PT plan of care cert/re-cert     Problem List Patient Active Problem List   Diagnosis Date Noted  . Total knee replacement status, left 09/27/2016  . S/P shoulder replacement 01/08/2015  . Acute renal failure ([place]) 08/02/2014  . Rhabdomyolysis 08/02/2014  . Hypotension 08/02/2014  . Lactic acidosis 08/02/2014  . Arthritis 08/02/2014  . Hyperlipidemia 08/02/2014  . Asthma 08/02/2014  . Chest pain 03/26/2014  . COPD (chronic obstructive pulmonary disease) ([place]) 03/25/2014  . Hypertension 03/25/2014  . Chest tightness or pressure 03/25/2014  . Supratherapeutic INR 03/25/2014  . Osteoarthritis 03/25/2014  . History of DVT (deep vein thrombosis) 03/25/2014   Starr Lake PT, DPT, LAT, ATC  09/06/17  1:21 PM      [facility] [address] [place], [place], [address] Phone: [phone]   Fax:  [phone]  Name: KAMIRA MELLETTE MRN: [id number] Date of Birth: Apr 06, 1961

## 2017-09-19 ENCOUNTER — Ambulatory Visit: Payer: Medicaid Other | Admitting: Physical Therapy

## 2017-09-19 ENCOUNTER — Encounter: Payer: Self-pay | Admitting: Physical Therapy

## 2017-09-19 DIAGNOSIS — R293 Abnormal posture: Secondary | ICD-10-CM

## 2017-09-19 DIAGNOSIS — M545 Low back pain: Principal | ICD-10-CM

## 2017-09-19 DIAGNOSIS — G8929 Other chronic pain: Secondary | ICD-10-CM

## 2017-09-19 DIAGNOSIS — M62830 Muscle spasm of back: Secondary | ICD-10-CM

## 2017-09-19 DIAGNOSIS — R2689 Other abnormalities of gait and mobility: Secondary | ICD-10-CM

## 2017-09-19 DIAGNOSIS — M6281 Muscle weakness (generalized): Secondary | ICD-10-CM

## 2017-09-19 NOTE — Therapy (Addendum)
[place], [place], [address] Phone: [phone]   Fax:  [phone]  Physical Therapy Treatment / Discharge Summary  Patient Details  Name: Amanda Christensen MRN: [id number] Date of Birth: 1961/08/13 Referring Provider: Kary Kos MD  Encounter Date: 09/19/2017      PT End of Session - 09/19/17 1211    Visit Number 2   Number of Visits 13   Date for PT Re-Evaluation 11/01/17   Authorization Type MCD: re-evaluating by 4th visit   PT Start Time 1132   PT Stop Time 1222   PT Time Calculation (min) 50 min   Activity Tolerance Patient tolerated treatment well   Behavior During Therapy [facility] for tasks assessed/performed      Past Medical History:  Diagnosis Date  . Arthritis   . Asthma   . Chronic pain   . COPD (chronic obstructive pulmonary disease) ([place])   . Depression    history of   . 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 Heart murmur   . History of anxiety    "used to have anxiety attacks"  . History of bronchitis   . Hypertension    takes Verapamil and Lisinopril  daily  . Insomnia    takes Trazodone nightly  . Obesity   . Peripheral vascular disease ([facility]) 10   rt leg dvt   . Seizures ([place]) 03   was on medications none now off  since 2011  . Shortness of breath dyspnea     Past Surgical History:  Procedure Laterality Date  . BREAST LUMPECTOMY WITH NEEDLE LOCALIZATION Left 09/13/2016   Procedure: LEFT BREAST LUMPECTOMY WITH NEEDLE LOCALIZATION;  Surgeon: Clovis Riley, MD;  Location: [place];  Service: General;  Laterality: Left;  . CESAREAN SECTION    . COLONOSCOPY    . DILATION AND CURETTAGE OF UTERUS    . HERNIA REPAIR     umbilical  . JOINT REPLACEMENT    . miscarriage    . ROTATOR CUFF REPAIR Left   . TOTAL KNEE ARTHROPLASTY Right   . TOTAL KNEE ARTHROPLASTY Left 09/27/2016   Procedure: LEFT TOTAL KNEE ARTHROPLASTY;  Surgeon:  [name], MD;  Location: [place];  Service: Orthopedics;  Laterality: Left;  . TOTAL SHOULDER ARTHROPLASTY Left 01/08/2015   dr Veverly Fells  . TOTAL SHOULDER ARTHROPLASTY Left 01/08/2015   Procedure: LEFT TOTAL SHOULDER ARTHROPLASTY;  Surgeon: Augustin Schooling, MD;  Location: [place];  Service: Orthopedics;  Laterality: Left;    There were no vitals filed for this visit.      Subjective Assessment - 09/19/17 1121    Subjective "things are going well except for when I wake up in the morning, The exercise help but make me feel sore"    Currently in Pain? Yes  just took medication right before treatment   Pain Score 8    Pain Location Back   Pain Orientation Left;Lower   Pain Descriptors / Indicators Aching;Sharp   Pain Type Chronic pain   Pain Onset More than a month ago   Pain Frequency Intermittent   Aggravating Factors  walking, moving around, bending   Pain Relieving Factors mediaction, heating pad                         [facility] Adult PT Treatment/Exercise - 09/19/17 1138      Lumbar Exercises:  Stretches   Lower Trunk Rotation --  1 x 10   Lower Trunk Rotation Limitations verbal cues to stay within pain free range of motion     Lumbar Exercises: Supine   Glut Set 10 reps;3 seconds  in decompression position x 2sets   Clam --  2 x 10 with red theraband   Bent Knee Raise --  1 x 20   Bent Knee Raise Limitations verbal cues for ADIM   Other Supine Lumbar Exercises decompression position x 3 min, 2 x 8 with heels on physioball (vebral cues for positive imagery regarding muscle calming and getting strong due to pt being so pain focused)   Other Supine Lumbar Exercises pelvic tilts 2 x 10 holdig 3 sec in decompression postion., ball pressing into legs to incorportat core strengthening 2x 10 with 3 sec hold (verbal cues for breathing)                PT Education - 09/19/17 1211    Education provided Yes   Education Details positive imagery with exercise  to promote pain relief, updated HEP for decompression position.    Person(s) Educated Patient   Methods Explanation;Verbal cues;Handout   Comprehension Verbalized understanding;Verbal cues required          PT Short Term Goals - 09/06/17 1301      PT SHORT TERM GOAL #1   Title pt to be I with inital HEP    Baseline no previous HEP for low back   Time 3   Period Weeks   Status New   Target Date 09/27/17     PT SHORT TERM GOAL #2   Title pt to verbalize and demo proper posture with standing/ walking and proper form with liftting/carrying mechanics to prevent and reduce low back pain   Baseline no knowledge    Time 3   Period Weeks   Status New   Target Date 09/27/17     PT SHORT TERM GOAL #3   Title demonstarte decreased muscle spasm in the low back reduce pain to </= 6/10 at worst and promote trunk mobility    Baseline pain 8/10 (at worst) with limited mobility    Time 3   Period Weeks   Status New           PT Long Term Goals - 09/06/17 1306      PT LONG TERM GOAL #1   Title pt to increase trunk flexion to >/= 60 degrees and extension / bil sidebending to >/= 18 degrees with </= 3/10 pain for functional mobility required for ADLs   Baseline flexion 40 , extension 10, bil side bending 12 degrees with 6/10 pain    Time 6   Period Weeks   Status New   Target Date 10/18/17     PT LONG TERM GOAL #2   Title pt to increase bil hip abductors/ extensors to >/= 4/5 for stability and assist with posture with lifting / carrying activities    Baseline hip abduction/ extensors 3+/5    Time 6   Period Weeks   Status New   Target Date 10/18/17     PT LONG TERM GOAL #3   Title pt to be able to sit / stand and walk for >/= 30 min and navigate 10 steps reciprocally with </=3/10 pain for functional community ambulate    Baseline sit 5-10 min, standing/ walking 10 min   Time 6   Period Weeks   Status New  Target Date 10/18/17     PT LONG TERM GOAL #4   Title pt to be I  with all HEP given as of last visit   Baseline no previous HEP for back   Time 6   Period Weeks   Status New   Target Date 10/18/17               Plan - 09/19/17 1212    Clinical Impression Statement pt reports 8/10 pain in the low back today. Focused on strengthening of the core and hips while in a decompression position which pt reported decreased pain. with hip flexor strengthening and core work provide cues for positive imagery which pt exhibited reduce grimacing with exercise. MHP post session to calm down soreness.    PT Next Visit Plan review and update HEP, core strengthening, hip strengthening, trunk mobility, modalities PRN, assess pelvic alignment, teach bed mobility   PT Home Exercise Plan lower trunk rotation, supine marching, supine clamshells, hamstring stretch, decompression position.    Consulted and Agree with Plan of Care Patient      Patient will benefit from skilled therapeutic intervention in order to improve the following deficits and impairments:     Visit Diagnosis: Chronic left-sided low back pain, with sciatica presence unspecified  Abnormal posture  Muscle weakness (generalized)  Other abnormalities of gait and mobility  Muscle spasm of back     Problem List Patient Active Problem List   Diagnosis Date Noted  . Total knee replacement status, left 09/27/2016  . S/P shoulder replacement 01/08/2015  . Acute renal failure ([place]) 08/02/2014  . Rhabdomyolysis 08/02/2014  . Hypotension 08/02/2014  . Lactic acidosis 08/02/2014  . Arthritis 08/02/2014  . Hyperlipidemia 08/02/2014  . Asthma 08/02/2014  . Chest pain 03/26/2014  . COPD (chronic obstructive pulmonary disease) ([place]) 03/25/2014  . Hypertension 03/25/2014  . Chest tightness or pressure 03/25/2014  . Supratherapeutic INR 03/25/2014  . Osteoarthritis 03/25/2014  . History of DVT (deep vein thrombosis) 03/25/2014   Starr Lake PT, DPT, LAT, ATC  09/19/17  12:15  PM      [facility] [facility] [place], [place], [address] Phone: [phone]   Fax:  [phone]  Name: Amanda Christensen MRN: [id number] Date of Birth: October 10, 1961       PHYSICAL THERAPY DISCHARGE SUMMARY  Visits from Start of Care: 2  Current functional level related to goals / functional outcomes: See goals   Remaining deficits: unknown   Education / Equipment: HEP, theraband, posture   Plan: Patient agrees to discharge.  Patient goals were not met. Patient is being discharged due to not returning since the last visit.  ?????     Letizia Hook PT, DPT, LAT, ATC  10/30/17  3:50 PM

## 2017-09-26 ENCOUNTER — Ambulatory Visit: Payer: Medicaid Other | Admitting: Physical Therapy

## 2017-10-03 ENCOUNTER — Encounter: Payer: Medicaid Other | Admitting: Physical Therapy

## 2017-10-16 DIAGNOSIS — M25519 Pain in unspecified shoulder: Secondary | ICD-10-CM | POA: Insufficient documentation

## 2017-10-18 ENCOUNTER — Emergency Department (HOSPITAL_COMMUNITY)
Admission: EM | Admit: 2017-10-18 | Discharge: 2017-10-18 | Disposition: A | Discharge status: Home / Self Care | Payer: Medicaid Other | Attending: Emergency Medicine | Admitting: Emergency Medicine

## 2017-10-18 ENCOUNTER — Emergency Department (HOSPITAL_COMMUNITY): Payer: Medicaid Other

## 2017-10-18 ENCOUNTER — Encounter (HOSPITAL_COMMUNITY): Payer: Self-pay | Admitting: Family Medicine

## 2017-10-18 DIAGNOSIS — Z96612 Presence of left artificial shoulder joint: Secondary | ICD-10-CM | POA: Insufficient documentation

## 2017-10-18 DIAGNOSIS — Z96652 Presence of left artificial knee joint: Secondary | ICD-10-CM | POA: Diagnosis not present

## 2017-10-18 DIAGNOSIS — J45909 Unspecified asthma, uncomplicated: Secondary | ICD-10-CM | POA: Insufficient documentation

## 2017-10-18 DIAGNOSIS — M79642 Pain in left hand: Secondary | ICD-10-CM | POA: Insufficient documentation

## 2017-10-18 DIAGNOSIS — Y929 Unspecified place or not applicable: Secondary | ICD-10-CM | POA: Insufficient documentation

## 2017-10-18 DIAGNOSIS — J449 Chronic obstructive pulmonary disease, unspecified: Secondary | ICD-10-CM | POA: Insufficient documentation

## 2017-10-18 DIAGNOSIS — Z7901 Long term (current) use of anticoagulants: Secondary | ICD-10-CM | POA: Insufficient documentation

## 2017-10-18 DIAGNOSIS — Y939 Activity, unspecified: Secondary | ICD-10-CM | POA: Diagnosis not present

## 2017-10-18 DIAGNOSIS — Z87891 Personal history of nicotine dependence: Secondary | ICD-10-CM | POA: Insufficient documentation

## 2017-10-18 DIAGNOSIS — M79641 Pain in right hand: Secondary | ICD-10-CM | POA: Diagnosis not present

## 2017-10-18 DIAGNOSIS — M25561 Pain in right knee: Secondary | ICD-10-CM | POA: Diagnosis present

## 2017-10-18 DIAGNOSIS — Y999 Unspecified external cause status: Secondary | ICD-10-CM | POA: Insufficient documentation

## 2017-10-18 DIAGNOSIS — I10 Essential (primary) hypertension: Secondary | ICD-10-CM | POA: Diagnosis not present

## 2017-10-18 MED ORDER — HYDROCODONE-ACETAMINOPHEN 5-325 MG PO TABS: 1.0000 | ORAL_TABLET | Freq: Four times a day (QID) | ORAL | 0 refills | Status: DC | PRN

## 2017-10-18 MED ORDER — HYDROCODONE-ACETAMINOPHEN 5-325 MG PO TABS
2.0000 | ORAL_TABLET | Freq: Once | ORAL | Status: AC
  Administered 2017-10-18: 2 via ORAL
  Filled 2017-10-18: qty 2
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
## 2017-12-21 ENCOUNTER — Encounter (HOSPITAL_COMMUNITY): Payer: Self-pay | Admitting: *Deleted

## 2017-12-21 ENCOUNTER — Other Ambulatory Visit: Payer: Self-pay

## 2017-12-21 ENCOUNTER — Emergency Department (HOSPITAL_COMMUNITY)
Admission: EM | Admit: 2017-12-21 | Discharge: 2017-12-21 | Disposition: A | Discharge status: Home / Self Care | Payer: Medicaid Other | Attending: Emergency Medicine | Admitting: Emergency Medicine

## 2017-12-21 DIAGNOSIS — Z87891 Personal history of nicotine dependence: Secondary | ICD-10-CM | POA: Diagnosis not present

## 2017-12-21 DIAGNOSIS — N644 Mastodynia: Secondary | ICD-10-CM | POA: Diagnosis not present

## 2017-12-21 DIAGNOSIS — Z96653 Presence of artificial knee joint, bilateral: Secondary | ICD-10-CM | POA: Diagnosis not present

## 2017-12-21 DIAGNOSIS — J449 Chronic obstructive pulmonary disease, unspecified: Secondary | ICD-10-CM | POA: Diagnosis not present

## 2017-12-21 DIAGNOSIS — J45909 Unspecified asthma, uncomplicated: Secondary | ICD-10-CM | POA: Insufficient documentation

## 2017-12-21 DIAGNOSIS — Z96612 Presence of left artificial shoulder joint: Secondary | ICD-10-CM | POA: Insufficient documentation

## 2017-12-21 DIAGNOSIS — I10 Essential (primary) hypertension: Secondary | ICD-10-CM | POA: Insufficient documentation

## 2017-12-21 DIAGNOSIS — Z79899 Other long term (current) drug therapy: Secondary | ICD-10-CM | POA: Insufficient documentation

## 2017-12-21 DIAGNOSIS — Z7901 Long term (current) use of anticoagulants: Secondary | ICD-10-CM | POA: Insufficient documentation

## 2017-12-21 MED ORDER — TRAMADOL HCL 50 MG PO TABS: 50.0000 mg | ORAL_TABLET | Freq: Four times a day (QID) | ORAL | 0 refills | Status: DC | PRN

## 2017-12-21 NOTE — ED Notes (Signed)
ED Provider at bedside. 

## 2017-12-21 NOTE — ED Triage Notes (Addendum)
Pt in c/o L breast pain after falling out of bed today, pt reports having stent placed in breast last year, pt c/o L breast soreness, skin intact, pt reports being seen at breast center and told to get xray, pt A&O x4, denies hitting head, denies LOC, ambulatory, MAE

## 2017-12-21 NOTE — Discharge Instructions (Signed)
[facility] radiology recommends you get a breast mammogram and breast ultrasound. You need to call your primary care doctor as soon as possible and have them schedule of these studies.  Some of the pain may be from a small bruise or developing small hematoma. For pain take 1000 mg tylenol plus 50 mg tramadol every 6 hours. Do not exceed more than 4,000 mg of tylenol in 24 hours. Ice your breast for the first 48 hours of injury, after 48 hours switch to light heat/compression to increase blood flow to the area.    Return to the ED for redness, warmth, pus, induration/hardness of the breast

## 2017-12-21 NOTE — ED Provider Notes (Signed)
[facility] EMERGENCY DEPARTMENT Provider Note   CSN: [id number] Arrival date & time: 12/21/17  1603     History   Chief Complaint Chief Complaint  Patient presents with  . Fall  . Breast Pain    HPI Amanda Christensen is a 57 y.o. 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 Patient fell out of bed landing on her left arm/breast, thinks she felt something pull and is afraid that the stent/clips in her breast removed. She denies any head trauma, neck pain, loss of consciousness, vision changes, nausea, vomiting, bleeding. States she went to the breast center for this earlier today and was told to go to her primary care doctor to get an x-ray done. States her primary care doctor is closed on Friday since that she came to the ED. No redness, warmth, swelling to the breast.  HPI  Past Medical History:  Diagnosis Date  . Arthritis   . Asthma   . Chronic pain   . COPD (chronic obstructive pulmonary disease) ([place])   . Depression    history of   . 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 Heart murmur   . History of anxiety    "used to have anxiety attacks"  . History of bronchitis   . Hypertension    takes Verapamil and Lisinopril  daily  . Insomnia    takes Trazodone nightly  . Obesity   . Peripheral vascular disease ([facility]) 10   rt leg dvt   . Seizures ([place]) 03   was on medications none now off  since 2011  . Shortness of breath dyspnea     Patient Active Problem List   Diagnosis Date Noted  . Total knee replacement status, left 09/27/2016  . S/P shoulder replacement 01/08/2015  . Acute renal failure ([place]) 08/02/2014  . Rhabdomyolysis 08/02/2014  . Hypotension 08/02/2014  . Lactic acidosis 08/02/2014  . Arthritis 08/02/2014  . Hyperlipidemia 08/02/2014  . Asthma 08/02/2014  . Chest pain 03/26/2014  . COPD (chronic  obstructive pulmonary disease) ([place]) 03/25/2014  . Hypertension 03/25/2014  . Chest tightness or pressure 03/25/2014  . Supratherapeutic INR 03/25/2014  . Osteoarthritis 03/25/2014  . History of DVT (deep vein thrombosis) 03/25/2014    Past Surgical History:  Procedure Laterality Date  . BREAST LUMPECTOMY WITH NEEDLE LOCALIZATION Left 09/13/2016   Procedure: LEFT BREAST LUMPECTOMY WITH NEEDLE LOCALIZATION;  Surgeon: Clovis Riley, MD;  Location: [facility];  Service: General;  Laterality: Left;  . CESAREAN SECTION    . COLONOSCOPY    . DILATION AND CURETTAGE OF UTERUS    . HERNIA REPAIR     umbilical  . JOINT REPLACEMENT    . miscarriage    . ROTATOR CUFF REPAIR Left   . TOTAL KNEE ARTHROPLASTY Right   . TOTAL KNEE ARTHROPLASTY Left 09/27/2016   Procedure: LEFT TOTAL KNEE ARTHROPLASTY;  Surgeon: [name], MD;  Location: [facility];  Service: Orthopedics;  Laterality: Left;  . TOTAL SHOULDER ARTHROPLASTY Left 01/08/2015   dr Veverly Fells  . TOTAL SHOULDER ARTHROPLASTY Left 01/08/2015   Procedure: LEFT TOTAL SHOULDER ARTHROPLASTY;  Surgeon: Augustin Schooling, MD;  Location: [facility];  Service: Orthopedics;  Laterality: Left;    OB History    No data available       Home Medications    Prior to Admission  medications   Medication Sig Start Date End Date Taking? Authorizing Provider  albuterol (PROVENTIL HFA;VENTOLIN HFA) 108 (90 BASE) MCG/ACT inhaler Inhale 1-2 puffs into the lungs every 6 (six) hours as needed for wheezing or shortness of breath. 12/15/14   Quintella Reichert, MD  apixaban (ELIQUIS) 2.5 MG TABS tablet Take 1 tab po q12 hours x 14 days following surgery to prevent blood clots 09/27/16   Aundra Dubin, PA-C  Ascorbic Acid (VITAMIN C PO) Take 1 tablet by mouth at bedtime.     [provider]  cetirizine (ZYRTEC) 10 MG tablet Take 10 mg by mouth at bedtime.     [provider]  Cholecalciferol (VITAMIN D PO) Take 1 tablet by mouth at bedtime.     [provider]  diclofenac sodium (VOLTAREN) 1 % GEL Apply 2 g topically 4 (four) times daily. 09/28/15   Joy, Shawn C, PA-C  fluticasone (FLONASE) 50 MCG/ACT nasal spray Place 1 spray into both nostrils daily as needed for allergies or rhinitis.    [provider]  Fluticasone-Salmeterol (ADVAIR) 250-50 MCG/DOSE AEPB Inhale 1 puff into the lungs 2 (two) times daily.    [provider]  Glycerin-Polysorbate 80 (REFRESH DRY EYE THERAPY OP) Apply 1 drop to eye daily as needed (dry eyes).    [provider]  HYDROcodone-acetaminophen (NORCO/VICODIN) 5-325 MG tablet Take 1-2 tablets by mouth every 6 (six) hours as needed. 10/18/17   Montine Circle, PA-C  HYDROmorphone (DILAUDID) 2 MG tablet Take 1-2 tabs po q4-6 hours prn pain 09/27/16   Aundra Dubin, PA-C  lisinopril (PRINIVIL,ZESTRIL) 20 MG tablet Take 20 mg by mouth daily.    [provider]  methocarbamol (ROBAXIN) 500 MG tablet Take 1 tablet (500 mg total) by mouth 3 (three) times daily as needed. 01/08/15   Netta Cedars, MD  montelukast (SINGULAIR) 10 MG tablet Take 10 mg by mouth at bedtime.    [provider]  ondansetron (ZOFRAN) 4 MG tablet Take 1 tablet (4 mg total) by mouth every 8 (eight) hours as needed for nausea or vomiting. 09/27/16   Aundra Dubin, PA-C  RESTASIS MULTIDOSE 0.05 % ophthalmic emulsion Place 1 drop into both eyes daily. 08/14/16   [provider]  traMADol (ULTRAM) 50 MG tablet Take 1 tablet (50 mg total) by mouth every 6 (six) hours as needed. 12/21/17   Kinnie Feil, PA-C  traZODone (DESYREL) 100 MG tablet Take 100 mg by mouth at bedtime.    [provider]  triamcinolone cream (KENALOG) 0.1 % Apply 1 application topically 2 (two) times daily.    [provider]  verapamil (VERELAN PM) 240 MG 24 hr capsule Take 240 mg by mouth at bedtime.     [provider]    Family History Family History  Problem Relation Age of Onset  .  Cancer Father   . Hypertension Unknown        family history  . Cancer Unknown        family history  . Drug abuse Unknown        family history  . Other Unknown        family history of psychiatric problems, disabilities    Social History Social History   Tobacco Use  . Smoking status: Former Smoker    Packs/day: 0.50    Years: 17.00    Pack years: 8.50    Types: Cigarettes    Last attempt to quit: 11/20/2012  Years since quitting: 5.0  . Smokeless tobacco: Former Network engineer Use Topics  . Alcohol use: No  . Drug use: No     Allergies   Penicillins; Tomato; and Oxycodone   Review of Systems Review of Systems  Skin:       Left breast pain  All other systems reviewed and are negative.    Physical Exam Updated Vital Signs BP (!) 148/84 (BP Location: Right Arm)   Pulse 69   Temp 97.8 F (36.6 C) (Oral)   Resp 20   Ht 5' (1.524 m)   Wt 99.8 kg (220 lb)   SpO2 100%   BMI 42.97 kg/m   Physical Exam  Constitutional: She is oriented to person, place, and time. She appears well-developed and well-nourished. No distress.  NAD.  HENT:  Head: Normocephalic and atraumatic.  Right Ear: External ear normal.  Left Ear: External ear normal.  Nose: Nose normal.  Eyes: Conjunctivae and EOM are normal. No scleral icterus.  Neck: Normal range of motion. Neck supple.  Cardiovascular: Normal rate, regular rhythm and normal heart sounds.  No murmur heard. Pulmonary/Chest: Effort normal and breath sounds normal. She has no wheezes.  Musculoskeletal: Normal range of motion. She exhibits no deformity.  Neurological: She is alert and oriented to person, place, and time.  Skin: Skin is warm and dry. Capillary refill takes less than 2 seconds.  Well healed surgical scar to left breast at 4 o'clock. Mild tenderness to left lateral/inferior breast w/o induration, warmth, erythema, ecchymosis.   Psychiatric: She has a normal mood and affect. Her behavior is normal. Judgment  and thought content normal.  Nursing note and vitals reviewed.    ED Treatments / Results  Labs (all labs ordered are listed, but only abnormal results are displayed) Labs Reviewed - No data to display  EKG  EKG Interpretation None       Radiology No results found.  Procedures Procedures (including critical care time)  Medications Ordered in ED Medications - No data to display   Initial Impression / Assessment and Plan / ED Course  I have reviewed the triage vital signs and the nursing notes.  Pertinent labs & imaging results that were available during my care of the patient were reviewed by me and considered in my medical decision making (see chart for details).    I spoke to [facility] radiologist regarding best imaging study for this patient. They recommend a outpatient mammogram/ultrasound. No signs or symptoms of breast abscess or large hematoma to warrant emergent breast ultrasound today. Patient does not have any chest pain, shortness of breath, pleuritic chest wall pain with deep breathing. I have low suspicion for rib fracture in this patient. There is no head trauma or LOC to warrant CT of head. Discussed plan to discharge with patient, advised her to call her primary care doctor to order an outpatient mammogram/ultrasound. We'll discharge with conservative management for contusion. Discussed return precautions. Patient verbalized understanding and agreeable with plan, all questions and concerns distressed.  Final Clinical Impressions(s) / ED Diagnoses   Final diagnoses:  Breast pain    ED Discharge Orders        Ordered    traMADol (ULTRAM) 50 MG tablet  Every 6 hours PRN     12/21/17 1804       Kinnie Feil, PA-C 12/21/17 1819    Malvin Johns, MD 12/21/17 2034

## 2018-01-11 ENCOUNTER — Other Ambulatory Visit: Payer: Self-pay | Admitting: Specialist

## 2018-01-11 DIAGNOSIS — N644 Mastodynia: Secondary | ICD-10-CM

## 2018-01-21 ENCOUNTER — Other Ambulatory Visit: Payer: Medicaid Other

## 2018-01-21 ENCOUNTER — Ambulatory Visit
Admission: RE | Admit: 2018-01-21 | Discharge: 2018-01-21 | Disposition: A | Discharge status: Home / Self Care | Payer: Medicaid Other | Source: Ambulatory Visit | Attending: Specialist | Admitting: Specialist

## 2018-01-21 ENCOUNTER — Other Ambulatory Visit: Payer: Self-pay | Admitting: Specialist

## 2018-01-21 DIAGNOSIS — N644 Mastodynia: Secondary | ICD-10-CM

## 2018-01-24 ENCOUNTER — Other Ambulatory Visit: Payer: Self-pay | Admitting: Specialist

## 2018-01-24 DIAGNOSIS — N6489 Other specified disorders of breast: Secondary | ICD-10-CM

## 2018-06-30 IMAGING — US ULTRASOUND LEFT BREAST LIMITED
1 series · 1 of 1 positions shown · non-contrast
Comparison: Previous exam(s).

CLINICAL DATA: The patient had a papilloma removed from the left
breast in 9486. She had a recent fall and has persistent pain for 3
weeks.

EXAM:
DIGITAL DIAGNOSTIC BILATERAL MAMMOGRAM WITH TOMO
ULTRASOUND LEFT BREAST

[Series 1: ultrasound left breast limited · 0.07mm/px · 1 of 1 slices shown]
[im 1/1]
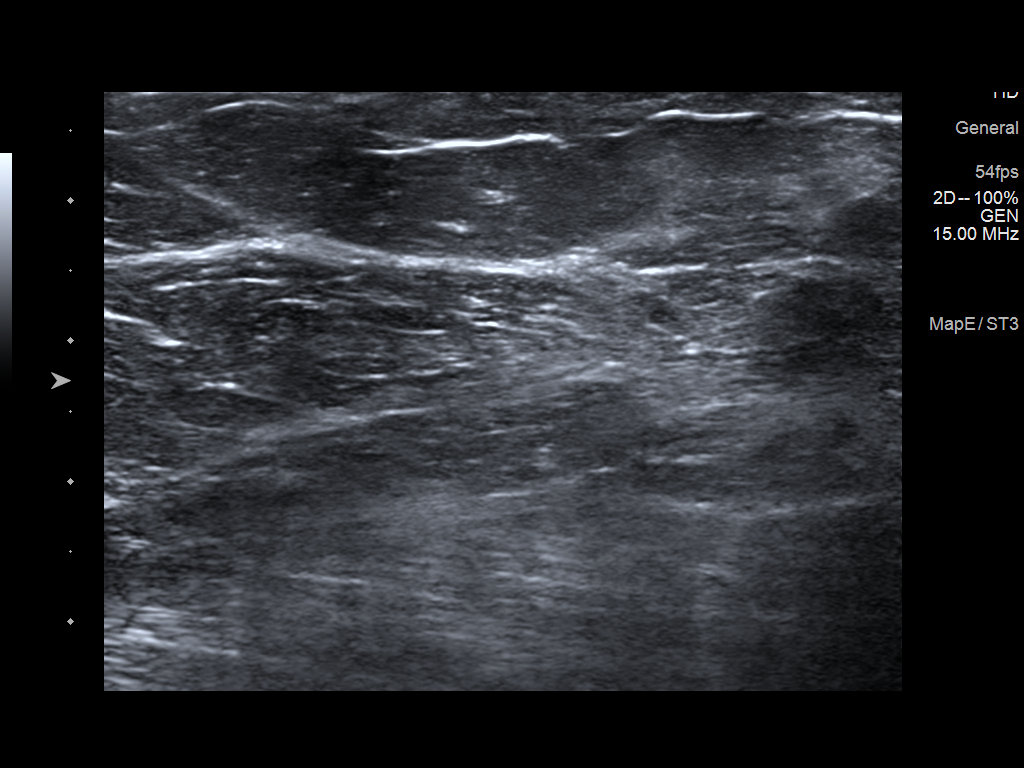

[1 of 1 positions shown; findings below may reference images not displayed]

ACR Breast Density Category b: There are scattered areas of
fibroglandular density.
FINDINGS: The rounded focal asymmetry in the upper outer left breast along the
posterior aspect of the patient's scar is almost certainly
postsurgical in nature. More anteriorly and inferiorly is a small
mass which is stable consistent with a benign etiology. No other
changes.

On physical exam, no suspicious lumps are identified.

Targeted ultrasound is performed, showing postsurgical changes and
surrounding fat necrosis. No other suspicious findings.
IMPRESSION: The mammographic findings are most consistent with developing fat
necrosis/evolving postsurgical change. No other suspicious findings.

RECOMMENDATION:
Recommend six-month follow-up mammogram to ensure stability of the
findings in the upper outer left breast which are somewhat masslike
but favored to represent developing fat necrosis.

I have discussed the findings and recommendations with the patient.
Results were also provided in writing at the conclusion of the
visit. If applicable, a reminder letter will be sent to the patient
regarding the next appointment.

BI-RADS CATEGORY  3: Probably benign.

## 2018-07-27 ENCOUNTER — Encounter (HOSPITAL_COMMUNITY): Payer: Self-pay | Admitting: *Deleted

## 2018-07-27 ENCOUNTER — Other Ambulatory Visit: Payer: Self-pay

## 2018-07-27 ENCOUNTER — Emergency Department (HOSPITAL_COMMUNITY)
Admission: EM | Admit: 2018-07-27 | Discharge: 2018-07-28 | Disposition: A | Discharge status: Home / Self Care | Payer: Medicaid Other | Attending: Emergency Medicine | Admitting: Emergency Medicine

## 2018-07-27 ENCOUNTER — Emergency Department (HOSPITAL_COMMUNITY): Payer: Medicaid Other

## 2018-07-27 DIAGNOSIS — J069 Acute upper respiratory infection, unspecified: Secondary | ICD-10-CM

## 2018-07-27 DIAGNOSIS — E785 Hyperlipidemia, unspecified: Secondary | ICD-10-CM | POA: Insufficient documentation

## 2018-07-27 DIAGNOSIS — J4521 Mild intermittent asthma with (acute) exacerbation: Secondary | ICD-10-CM

## 2018-07-27 DIAGNOSIS — R05 Cough: Secondary | ICD-10-CM | POA: Diagnosis present

## 2018-07-27 DIAGNOSIS — B9789 Other viral agents as the cause of diseases classified elsewhere: Secondary | ICD-10-CM

## 2018-07-27 DIAGNOSIS — Z7901 Long term (current) use of anticoagulants: Secondary | ICD-10-CM | POA: Insufficient documentation

## 2018-07-27 DIAGNOSIS — I10 Essential (primary) hypertension: Secondary | ICD-10-CM | POA: Insufficient documentation

## 2018-07-27 DIAGNOSIS — Z79899 Other long term (current) drug therapy: Secondary | ICD-10-CM | POA: Insufficient documentation

## 2018-07-27 DIAGNOSIS — Z87891 Personal history of nicotine dependence: Secondary | ICD-10-CM | POA: Insufficient documentation

## 2018-07-27 DIAGNOSIS — Z86718 Personal history of other venous thrombosis and embolism: Secondary | ICD-10-CM | POA: Diagnosis not present

## 2018-07-27 NOTE — ED Triage Notes (Signed)
The pt has had a cold  And cough since Thursday she has coughed  So much it hurts her chest  She has asthma and she has had to use her inhaler more today than usual

## 2018-07-28 MED ORDER — PREDNISONE 20 MG PO TABS
60.0000 mg | ORAL_TABLET | Freq: Once | ORAL | Status: AC
  Administered 2018-07-28: 60 mg via ORAL
  Filled 2018-07-28: qty 3

## 2018-07-28 MED ORDER — PREDNISONE 20 MG PO TABS: 40.0000 mg | ORAL_TABLET | Freq: Every day | ORAL | 0 refills | Status: DC

## 2018-07-28 MED ORDER — ALBUTEROL SULFATE (2.5 MG/3ML) 0.083% IN NEBU
5.0000 mg | INHALATION_SOLUTION | Freq: Once | RESPIRATORY_TRACT | Status: AC
  Administered 2018-07-28: 5 mg via RESPIRATORY_TRACT
  Filled 2018-07-28: qty 6

## 2018-07-28 MED ORDER — IPRATROPIUM BROMIDE 0.02 % IN SOLN
0.5000 mg | Freq: Once | RESPIRATORY_TRACT | Status: AC
  Administered 2018-07-28: 0.5 mg via RESPIRATORY_TRACT
  Filled 2018-07-28: qty 2.5

## 2018-07-28 NOTE — Discharge Instructions (Signed)
1. Medications: albuterol, prednisone, usual home medications 2. Treatment: rest, drink plenty of fluids, begin OTC antihistamine (Zyrtec or Claritin); do not take decongestants as this will increase your BP 3. Follow Up: Please followup with your primary doctor in 2-3 days for discussion of your diagnoses and further evaluation after today's visit; if you do not have a primary care doctor use the resource guide provided to find one; Please return to the ER for difficulty breathing, high fevers or worsening symptoms.

## 2018-07-28 NOTE — ED Provider Notes (Signed)
[facility] EMERGENCY DEPARTMENT Provider Note   CSN: [id number] Arrival date & time: 07/27/18  2313     History   Chief Complaint Chief Complaint  Patient presents with  . Cough    HPI Amanda Christensen is a 57 y.o. female with a hx of COPD, hypertension, DVT, asthma presents to the Emergency Department complaining of gradual, persistent, progressively worsening URI symptoms onset 2 days ago. Associated symptoms include persistent cough, sore throat, wheezing, nasal congestion, rhinorrhea.  Patient reports associated chest pain with her cough.  Pt reports cough is productive of clear sputum.  She reports she has used her albuterol inhaler with some improvement but has had to increase this to everyday. Nothing makes her symptoms worse.  She reports some of her friends are sick.  Pt denies fever, chills, headache, neck pain, chest pain, abd pain, N/V/D, weakness, dizziness, syncope, hemoptysis.       The history is provided by the patient and medical records. No language interpreter was used.    Past Medical History:  Diagnosis Date  . Arthritis   . Asthma   . Chronic pain   . COPD (chronic obstructive pulmonary disease) ([place])   . Depression    history of   . 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 Heart murmur   . History of anxiety    "used to have anxiety attacks"  . History of bronchitis   . Hypertension    takes Verapamil and Lisinopril  daily  . Insomnia    takes Trazodone nightly  . Obesity   . Peripheral vascular disease (H[facility]) 10   rt leg dvt   . Seizures ([place]) 03   was on medications none now off  since 2011  . Shortness of breath dyspnea     Patient Active Problem List   Diagnosis Date Noted  . Total knee replacement status, left 09/27/2016  . S/P shoulder replacement 01/08/2015  . Acute renal failure ([place]) 08/02/2014  . Rhabdomyolysis 08/02/2014  . Hypotension 08/02/2014  . Lactic acidosis  08/02/2014  . Arthritis 08/02/2014  . Hyperlipidemia 08/02/2014  . Asthma 08/02/2014  . Chest pain 03/26/2014  . COPD (chronic obstructive pulmonary disease) ([place]) 03/25/2014  . Hypertension 03/25/2014  . Chest tightness or pressure 03/25/2014  . Supratherapeutic INR 03/25/2014  . Osteoarthritis 03/25/2014  . History of DVT (deep vein thrombosis) 03/25/2014    Past Surgical History:  Procedure Laterality Date  . BREAST EXCISIONAL BIOPSY Left 09/12/2016  . BREAST LUMPECTOMY WITH NEEDLE LOCALIZATION Left 09/13/2016   Procedure: LEFT BREAST LUMPECTOMY WITH NEEDLE LOCALIZATION;  Surgeon: Clovis Riley, MD;  Location: [facility];  Service: General;  Laterality: Left;  . CESAREAN SECTION    . COLONOSCOPY    . DILATION AND CURETTAGE OF UTERUS    . HERNIA REPAIR     umbilical  . JOINT REPLACEMENT    . miscarriage    . ROTATOR CUFF REPAIR Left   . TOTAL KNEE ARTHROPLASTY Right   . TOTAL KNEE ARTHROPLASTY Left 09/27/2016   Procedure: LEFT TOTAL KNEE ARTHROPLASTY;  Surgeon: [name], MD;  Location: [facility];  Service: Orthopedics;  Laterality: Left;  . TOTAL SHOULDER ARTHROPLASTY Left 01/08/2015   dr Veverly Fells  . TOTAL SHOULDER ARTHROPLASTY Left 01/08/2015   Procedure: LEFT TOTAL SHOULDER ARTHROPLASTY;  Surgeon: Augustin Schooling, MD;  Location: [facility];  Service: Orthopedics;  Laterality: Left;  OB History   None      Home Medications    Prior to Admission medications   Medication Sig Start Date End Date Taking? Authorizing Provider  albuterol (PROVENTIL HFA;VENTOLIN HFA) 108 (90 BASE) MCG/ACT inhaler Inhale 1-2 puffs into the lungs every 6 (six) hours as needed for wheezing or shortness of breath. 12/15/14   Quintella Reichert, MD  apixaban (ELIQUIS) 2.5 MG TABS tablet Take 1 tab po q12 hours x 14 days following surgery to prevent blood clots 09/27/16   Aundra Dubin, PA-C  Ascorbic Acid (VITAMIN C PO) Take 1 tablet by mouth at bedtime.     [provider]  cetirizine  (ZYRTEC) 10 MG tablet Take 10 mg by mouth at bedtime.     [provider]  Cholecalciferol (VITAMIN D PO) Take 1 tablet by mouth at bedtime.     [provider]  diclofenac sodium (VOLTAREN) 1 % GEL Apply 2 g topically 4 (four) times daily. 09/28/15   Joy, Shawn C, PA-C  fluticasone (FLONASE) 50 MCG/ACT nasal spray Place 1 spray into both nostrils daily as needed for allergies or rhinitis.    [provider]  Fluticasone-Salmeterol (ADVAIR) 250-50 MCG/DOSE AEPB Inhale 1 puff into the lungs 2 (two) times daily.    [provider]  Glycerin-Polysorbate 80 (REFRESH DRY EYE THERAPY OP) Apply 1 drop to eye daily as needed (dry eyes).    [provider]  HYDROcodone-acetaminophen (NORCO/VICODIN) 5-325 MG tablet Take 1-2 tablets by mouth every 6 (six) hours as needed. 10/18/17   Montine Circle, PA-C  HYDROmorphone (DILAUDID) 2 MG tablet Take 1-2 tabs po q4-6 hours prn pain 09/27/16   Aundra Dubin, PA-C  lisinopril (PRINIVIL,ZESTRIL) 20 MG tablet Take 20 mg by mouth daily.    [provider]  methocarbamol (ROBAXIN) 500 MG tablet Take 1 tablet (500 mg total) by mouth 3 (three) times daily as needed. 01/08/15   Netta Cedars, MD  montelukast (SINGULAIR) 10 MG tablet Take 10 mg by mouth at bedtime.    [provider]  ondansetron (ZOFRAN) 4 MG tablet Take 1 tablet (4 mg total) by mouth every 8 (eight) hours as needed for nausea or vomiting. 09/27/16   Aundra Dubin, PA-C  predniSONE (DELTASONE) 20 MG tablet Take 2 tablets (40 mg total) by mouth daily. 07/28/18   Iana Buzan, Jarrett Soho, PA-C  RESTASIS MULTIDOSE 0.05 % ophthalmic emulsion Place 1 drop into both eyes daily. 08/14/16   [provider]  traMADol (ULTRAM) 50 MG tablet Take 1 tablet (50 mg total) by mouth every 6 (six) hours as needed. 12/21/17   Kinnie Feil, PA-C  traZODone (DESYREL) 100 MG tablet Take 100 mg by mouth at bedtime.    [provider]  triamcinolone  cream (KENALOG) 0.1 % Apply 1 application topically 2 (two) times daily.    [provider]  verapamil (VERELAN PM) 240 MG 24 hr capsule Take 240 mg by mouth at bedtime.     [provider]    Family History Family History  Problem Relation Age of Onset  . Cancer Father   . Breast cancer Sister   . Hypertension Unknown        family history  . Cancer Unknown        family history  . Drug abuse Unknown        family history  . Other Unknown        family history of psychiatric problems, disabilities  Social History Social History   Tobacco Use  . Smoking status: Former Smoker    Packs/day: 0.50    Years: 17.00    Pack years: 8.50    Types: Cigarettes    Last attempt to quit: 11/20/2012    Years since quitting: 5.6  . Smokeless tobacco: Former Network engineer Use Topics  . Alcohol use: No  . Drug use: No     Allergies   Penicillins; Tomato; and Oxycodone   Review of Systems Review of Systems  Constitutional: Positive for fatigue. Negative for appetite change, chills and fever.  HENT: Positive for congestion, postnasal drip, rhinorrhea, sinus pressure and sore throat. Negative for ear discharge, ear pain and mouth sores.   Eyes: Negative for visual disturbance.  Respiratory: Positive for cough, chest tightness, shortness of breath and wheezing. Negative for stridor.   Cardiovascular: Negative for chest pain, palpitations and leg swelling.  Gastrointestinal: Negative for abdominal pain, diarrhea, nausea and vomiting.  Genitourinary: Negative for dysuria, frequency, hematuria and urgency.  Musculoskeletal: Negative for arthralgias, back pain, myalgias and neck stiffness.  Skin: Negative for rash.  Neurological: Positive for headaches. Negative for syncope, light-headedness and numbness.  Hematological: Negative for adenopathy.  Psychiatric/Behavioral: The patient is not nervous/anxious.   All other systems reviewed and are negative.    Physical  Exam Updated Vital Signs BP 123/65   Pulse 74   Temp 98.1 F (36.7 C) (Oral)   Resp 16   Ht 5' (1.524 m)   Wt 112.5 kg   SpO2 100%   BMI 48.43 kg/m   Physical Exam  Constitutional: She appears well-developed and well-nourished. No distress.  HENT:  Head: Normocephalic and atraumatic.  Right Ear: Tympanic membrane, external ear and ear canal normal.  Left Ear: Tympanic membrane, external ear and ear canal normal.  Nose: Mucosal edema and rhinorrhea present. No epistaxis. Right sinus exhibits no maxillary sinus tenderness and no frontal sinus tenderness. Left sinus exhibits no maxillary sinus tenderness and no frontal sinus tenderness.  Mouth/Throat: Uvula is midline and mucous membranes are normal. Mucous membranes are not pale and not cyanotic. No oropharyngeal exudate, posterior oropharyngeal edema, posterior oropharyngeal erythema or tonsillar abscesses.  Eyes: Pupils are equal, round, and reactive to light. Conjunctivae are normal.  Neck: Normal range of motion and full passive range of motion without pain.  Cardiovascular: Normal rate and intact distal pulses.  Pulmonary/Chest: Effort normal and breath sounds normal. No stridor.  Clear and equal breath sounds without focal wheezes, rhonchi, rales  Abdominal: Soft. There is no tenderness.  Musculoskeletal: Normal range of motion.  Lymphadenopathy:    She has no cervical adenopathy.  Neurological: She is alert.  Skin: Skin is warm and dry. No rash noted. She is not diaphoretic.  Psychiatric: She has a normal mood and affect.  Nursing note and vitals reviewed.    ED Treatments / Results   Radiology Dg Chest 2 View  Result Date: 07/27/2018 CLINICAL DATA:  Cough and congestion for 2 days. EXAM: CHEST - 2 VIEW COMPARISON:  09/15/2016 and prior radiographs FINDINGS: Mild cardiomegaly noted. Mild chronic peribronchial thickening is unchanged. There is no evidence of focal airspace disease, pulmonary edema, suspicious pulmonary  nodule/mass, pleural effusion, or pneumothorax. No acute bony abnormalities are identified. LEFT shoulder arthroplasty again noted. IMPRESSION: Mild cardiomegaly without evidence of acute cardiopulmonary disease. Electronically Signed   By: Margarette Canada M.D.   On: 07/27/2018 23:51    Procedures Procedures (including critical care time)  Medications Ordered  in ED Medications  albuterol (PROVENTIL) (2.5 MG/3ML) 0.083% nebulizer solution 5 mg (5 mg Nebulization Given 07/28/18 0046)  ipratropium (ATROVENT) nebulizer solution 0.5 mg (0.5 mg Nebulization Given 07/28/18 0046)  predniSONE (DELTASONE) tablet 60 mg (60 mg Oral Given 07/28/18 0046)     Initial Impression / Assessment and Plan / ED Course  I have reviewed the triage vital signs and the nursing notes.  Pertinent labs & imaging results that were available during my care of the patient were reviewed by me and considered in my medical decision making (see chart for details).  Clinical Course as of Jul 29 111  Sun Jul 28, 2018  0018 No pneumonia, pulmonary edema or pneumothorax.  Heart size does not appear significantly larger than a previous chest x-ray in 2018.  Personally evaluated these films.  DG Chest 2 View [HM]  0019 Hypertensive on arrival.  This has improved without intervention here in the emergency department.  BP(!): 173/82 [HM]  0111 Breath sounds remain clear after nebulizer.  Increased tidal volume.     [HM]    Clinical Course User Index [HM] Briselda Naval, Gwenlyn Perking    Presents with cough and shortness of breath in conjunction with URI symptoms.  Chest x-ray is without acute abnormality including no pulmonary edema or pneumothorax.  No consolidation to suggest pneumonia.  I personally evaluated these images.  Recent is without tachycardia or hypoxia.  She is afebrile.  No evidence of sepsis.  Patient is taking lisinopril but this is not a new medication for her.  Suspect her cough is secondary to viral etiology as opposed  to being medication related.  1:11 AM She reports feeling better after breathing treatment.  Increase tidal volume.  She remains without focal breath sounds.  Will be discharged home.  She is to follow with her primary care physician as directed.  She is to take prednisone and use her albuterol as needed.  Patient states understanding and is in agreement with the plan.  Discussed reasons to return immediately to the emergency department.  Final Clinical Impressions(s) / ED Diagnoses   Final diagnoses:  Viral URI with cough  Mild intermittent asthma with exacerbation    ED Discharge Orders         Ordered    predniSONE (DELTASONE) 20 MG tablet  Daily     07/28/18 0036           Nesha Counihan, Jarrett Soho, PA-C 07/28/18 0112    Cardama, Grayce Sessions, MD 07/28/18 [phone]

## 2018-07-31 ENCOUNTER — Other Ambulatory Visit: Payer: Self-pay | Admitting: Specialist

## 2018-07-31 ENCOUNTER — Ambulatory Visit
Admission: RE | Admit: 2018-07-31 | Discharge: 2018-07-31 | Disposition: A | Discharge status: Home / Self Care | Payer: Medicaid Other | Source: Ambulatory Visit | Attending: Specialist | Admitting: Specialist

## 2018-07-31 DIAGNOSIS — N6489 Other specified disorders of breast: Secondary | ICD-10-CM

## 2018-07-31 DIAGNOSIS — N6320 Unspecified lump in the left breast, unspecified quadrant: Secondary | ICD-10-CM

## 2018-08-06 ENCOUNTER — Other Ambulatory Visit: Payer: Self-pay | Admitting: Specialist

## 2018-08-06 ENCOUNTER — Ambulatory Visit
Admission: RE | Admit: 2018-08-06 | Discharge: 2018-08-06 | Disposition: A | Discharge status: Home / Self Care | Payer: Medicaid Other | Source: Ambulatory Visit | Attending: Specialist | Admitting: Specialist

## 2018-08-06 DIAGNOSIS — N6320 Unspecified lump in the left breast, unspecified quadrant: Secondary | ICD-10-CM

## 2018-09-24 DIAGNOSIS — H9011 Conductive hearing loss, unilateral, right ear, with unrestricted hearing on the contralateral side: Secondary | ICD-10-CM | POA: Insufficient documentation

## 2018-09-24 DIAGNOSIS — H6121 Impacted cerumen, right ear: Secondary | ICD-10-CM | POA: Insufficient documentation

## 2018-12-04 ENCOUNTER — Other Ambulatory Visit (HOSPITAL_COMMUNITY): Payer: Self-pay | Admitting: Orthopaedic Surgery

## 2018-12-05 ENCOUNTER — Other Ambulatory Visit (HOSPITAL_COMMUNITY): Payer: Self-pay | Admitting: Orthopaedic Surgery

## 2018-12-05 DIAGNOSIS — M25512 Pain in left shoulder: Secondary | ICD-10-CM

## 2018-12-16 ENCOUNTER — Ambulatory Visit (HOSPITAL_COMMUNITY): Admission: RE | Admit: 2018-12-16 | Payer: Medicaid Other | Source: Ambulatory Visit

## 2018-12-18 ENCOUNTER — Ambulatory Visit (HOSPITAL_COMMUNITY): Admission: RE | Admit: 2018-12-18 | Payer: Medicaid Other | Source: Ambulatory Visit

## 2018-12-18 ENCOUNTER — Other Ambulatory Visit (HOSPITAL_COMMUNITY): Payer: Self-pay | Admitting: Orthopaedic Surgery

## 2018-12-18 ENCOUNTER — Ambulatory Visit (HOSPITAL_COMMUNITY)
Admission: RE | Admit: 2018-12-18 | Discharge: 2018-12-18 | Disposition: A | Discharge status: Home / Self Care | Payer: Medicaid Other | Source: Ambulatory Visit | Attending: Orthopaedic Surgery | Admitting: Orthopaedic Surgery

## 2018-12-18 ENCOUNTER — Encounter (HOSPITAL_COMMUNITY): Payer: Self-pay

## 2018-12-18 DIAGNOSIS — Z96612 Presence of left artificial shoulder joint: Secondary | ICD-10-CM | POA: Diagnosis not present

## 2018-12-18 DIAGNOSIS — M25512 Pain in left shoulder: Secondary | ICD-10-CM

## 2018-12-18 LAB — SYNOVIAL CELL COUNT + DIFF, W/ CRYSTALS
CRYSTALS FLUID: NONE SEEN
EOSINOPHILS-SYNOVIAL: 0 % (ref 0–1)
Lymphocytes-Synovial Fld: 90 % — ABNORMAL HIGH (ref 0–20)
MONOCYTE-MACROPHAGE-SYNOVIAL FLUID: 3 % — AB (ref 50–90)
Neutrophil, Synovial: 7 % (ref 0–25)
WBC, Synovial: 64 /mm3 (ref 0–200)

## 2018-12-18 MED ORDER — LIDOCAINE HCL (PF) 1 % IJ SOLN
INTRAMUSCULAR | Status: AC
  Filled 2018-12-18: qty 5

## 2018-12-18 MED ORDER — LIDOCAINE HCL (PF) 1 % IJ SOLN
5.0000 mL | Freq: Once | INTRAMUSCULAR | Status: AC
  Administered 2018-12-18: 5 mL via INTRADERMAL

## 2018-12-18 MED ORDER — LIDOCAINE HCL (PF) 1 % IJ SOLN
INTRAMUSCULAR | Status: AC
  Filled 2018-12-18: qty 30

## 2018-12-18 MED ORDER — SODIUM CHLORIDE (PF) 0.9 % IJ SOLN
INTRAMUSCULAR | Status: AC
  Filled 2018-12-18: qty 20

## 2018-12-18 MED ORDER — IOPAMIDOL (ISOVUE-M 300) INJECTION 61%
INTRAMUSCULAR | Status: AC
  Filled 2018-12-18: qty 15

## 2018-12-18 MED ORDER — SODIUM CHLORIDE (PF) 0.9 % IJ SOLN
10.0000 mL | Freq: Once | INTRAMUSCULAR | Status: AC
  Administered 2018-12-18: 10 mL

## 2018-12-20 ENCOUNTER — Other Ambulatory Visit: Payer: Self-pay | Admitting: Specialist

## 2018-12-20 DIAGNOSIS — Z1231 Encounter for screening mammogram for malignant neoplasm of breast: Secondary | ICD-10-CM

## 2019-01-01 LAB — BODY FLUID CULTURE: CULTURE: NO GROWTH

## 2019-01-04 ENCOUNTER — Emergency Department (HOSPITAL_COMMUNITY): Payer: Medicaid Other

## 2019-01-04 ENCOUNTER — Emergency Department (HOSPITAL_COMMUNITY)
Admission: EM | Admit: 2019-01-04 | Discharge: 2019-01-05 | Disposition: A | Discharge status: Home / Self Care | Payer: Medicaid Other | Attending: Emergency Medicine | Admitting: Emergency Medicine

## 2019-01-04 ENCOUNTER — Encounter (HOSPITAL_COMMUNITY): Payer: Self-pay

## 2019-01-04 DIAGNOSIS — Z87891 Personal history of nicotine dependence: Secondary | ICD-10-CM | POA: Diagnosis not present

## 2019-01-04 DIAGNOSIS — I10 Essential (primary) hypertension: Secondary | ICD-10-CM | POA: Insufficient documentation

## 2019-01-04 DIAGNOSIS — Z79899 Other long term (current) drug therapy: Secondary | ICD-10-CM | POA: Insufficient documentation

## 2019-01-04 DIAGNOSIS — R0602 Shortness of breath: Secondary | ICD-10-CM | POA: Diagnosis not present

## 2019-01-04 DIAGNOSIS — J449 Chronic obstructive pulmonary disease, unspecified: Secondary | ICD-10-CM | POA: Insufficient documentation

## 2019-01-04 DIAGNOSIS — R079 Chest pain, unspecified: Secondary | ICD-10-CM

## 2019-01-04 LAB — CBC WITH DIFFERENTIAL/PLATELET
Abs Immature Granulocytes: 0.01 10*3/uL (ref 0.00–0.07)
Basophils Absolute: 0 10*3/uL (ref 0.0–0.1)
Basophils Relative: 0 %
EOS ABS: 0.1 10*3/uL (ref 0.0–0.5)
Eosinophils Relative: 2 %
HEMATOCRIT: 40.7 % (ref 36.0–46.0)
Hemoglobin: 12.6 g/dL (ref 12.0–15.0)
Immature Granulocytes: 0 %
LYMPHS ABS: 1.3 10*3/uL (ref 0.7–4.0)
Lymphocytes Relative: 28 %
MCH: 27.5 pg (ref 26.0–34.0)
MCHC: 31 g/dL (ref 30.0–36.0)
MCV: 88.9 fL (ref 80.0–100.0)
Monocytes Absolute: 1.1 10*3/uL — ABNORMAL HIGH (ref 0.1–1.0)
Monocytes Relative: 24 %
Neutro Abs: 2.1 10*3/uL (ref 1.7–7.7)
Neutrophils Relative %: 46 %
Platelets: 186 10*3/uL (ref 150–400)
RBC: 4.58 MIL/uL (ref 3.87–5.11)
RDW: 13.9 % (ref 11.5–15.5)
WBC: 4.6 10*3/uL (ref 4.0–10.5)
nRBC: 0 % (ref 0.0–0.2)

## 2019-01-04 LAB — BASIC METABOLIC PANEL
Anion gap: 9 (ref 5–15)
BUN: 11 mg/dL (ref 6–20)
CO2: 27 mmol/L (ref 22–32)
Calcium: 8.7 mg/dL — ABNORMAL LOW (ref 8.9–10.3)
Chloride: 99 mmol/L (ref 98–111)
Creatinine, Ser: 1 mg/dL (ref 0.44–1.00)
GFR calc Af Amer: >60 mL/min (ref >60)
GFR calc non Af Amer: >60 mL/min (ref >60)
Glucose, Bld: 98 mg/dL (ref 70–99)
Potassium: 4.2 mmol/L (ref 3.5–5.1)
Sodium: 135 mmol/L (ref 135–145)

## 2019-01-04 LAB — TROPONIN I: Troponin I: <0.03 ng/mL (ref <0.03)

## 2019-01-04 MED ORDER — IPRATROPIUM-ALBUTEROL 0.5-2.5 (3) MG/3ML IN SOLN
3.0000 mL | Freq: Once | RESPIRATORY_TRACT | Status: AC
  Administered 2019-01-04: 3 mL via RESPIRATORY_TRACT
  Filled 2019-01-04: qty 3

## 2019-01-04 NOTE — ED Provider Notes (Signed)
[facility] EMERGENCY DEPARTMENT Provider Note   CSN: [id number] Arrival date & time: 01/04/19  2128     History   Chief Complaint Chief Complaint  Patient presents with  . COPD    HPI Amanda Christensen is a 58 y.o. female.  She has a history of COPD and DVT on anticoagulation.  She is complaining of feeling sick since last night with some chest tightness cough shortness of breath and generalized body aches and feeling hot and cold.  She has not taken her temperature.  She has some pain when she coughs.  She is a sick contact with a grandchild that got diagnosed with the flu.  She did get her flu shot along with pneumonia shot this year.  The history is provided by the patient.  COPD  This is a recurrent problem. The current episode started yesterday. The problem has been gradually worsening. Associated symptoms include chest pain and shortness of breath. Pertinent negatives include no abdominal pain and no headaches. The symptoms are aggravated by coughing. Nothing relieves the symptoms. She has tried nothing for the symptoms. The treatment provided no relief.    Past Medical History:  Diagnosis Date  . Arthritis   . Asthma   . Chronic pain   . COPD (chronic obstructive pulmonary disease) ([place])   . Depression    history of   . 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 Heart murmur   . History of anxiety    "used to have anxiety attacks"  . History of bronchitis   . Hypertension    takes Verapamil and Lisinopril  daily  . Insomnia    takes Trazodone nightly  . Obesity   . Peripheral vascular disease ([facility]) 10   rt leg dvt   . Seizures ([place]) 03   was on medications none now off  since 2011  . Shortness of breath dyspnea     Patient Active Problem List   Diagnosis Date Noted  . Total knee replacement status, left 09/27/2016  . S/P shoulder replacement 01/08/2015  . Acute renal failure ([place]) 08/02/2014  .  Rhabdomyolysis 08/02/2014  . Hypotension 08/02/2014  . Lactic acidosis 08/02/2014  . Arthritis 08/02/2014  . Hyperlipidemia 08/02/2014  . Asthma 08/02/2014  . Chest pain 03/26/2014  . COPD (chronic obstructive pulmonary disease) ([place]) 03/25/2014  . Hypertension 03/25/2014  . Chest tightness or pressure 03/25/2014  . Supratherapeutic INR 03/25/2014  . Osteoarthritis 03/25/2014  . History of DVT (deep vein thrombosis) 03/25/2014    Past Surgical History:  Procedure Laterality Date  . BREAST EXCISIONAL BIOPSY Left 09/12/2016  . BREAST LUMPECTOMY WITH NEEDLE LOCALIZATION Left 09/13/2016   Procedure: LEFT BREAST LUMPECTOMY WITH NEEDLE LOCALIZATION;  Surgeon: Clovis Riley, MD;  Location: [facility];  Service: General;  Laterality: Left;  . CESAREAN SECTION    . COLONOSCOPY    . DILATION AND CURETTAGE OF UTERUS    . HERNIA REPAIR     umbilical  . JOINT REPLACEMENT    . miscarriage    . ROTATOR CUFF REPAIR Left   . TOTAL KNEE ARTHROPLASTY Right   . TOTAL KNEE ARTHROPLASTY Left 09/27/2016   Procedure: LEFT TOTAL KNEE ARTHROPLASTY;  Surgeon: [name], MD;  Location: [facility];  Service: Orthopedics;  Laterality: Left;  . TOTAL SHOULDER ARTHROPLASTY Left 01/08/2015   dr Veverly Fells  . TOTAL SHOULDER ARTHROPLASTY Left 01/08/2015   Procedure: LEFT TOTAL  SHOULDER ARTHROPLASTY;  Surgeon: Augustin Schooling, MD;  Location: [facility];  Service: Orthopedics;  Laterality: Left;     OB History   No obstetric history on file.      Home Medications    Prior to Admission medications   Medication Sig Start Date End Date Taking? Authorizing Provider  albuterol (PROVENTIL HFA;VENTOLIN HFA) 108 (90 BASE) MCG/ACT inhaler Inhale 1-2 puffs into the lungs every 6 (six) hours as needed for wheezing or shortness of breath. 12/15/14   Quintella Reichert, MD  apixaban (ELIQUIS) 2.5 MG TABS tablet Take 1 tab po q12 hours x 14 days following surgery to prevent blood clots 09/27/16   Aundra Dubin, PA-C  Ascorbic Acid  (VITAMIN C PO) Take 1 tablet by mouth at bedtime.     [provider]  cetirizine (ZYRTEC) 10 MG tablet Take 10 mg by mouth at bedtime.     [provider]  Cholecalciferol (VITAMIN D PO) Take 1 tablet by mouth at bedtime.     [provider]  diclofenac sodium (VOLTAREN) 1 % GEL Apply 2 g topically 4 (four) times daily. 09/28/15   Joy, Shawn C, PA-C  fluticasone (FLONASE) 50 MCG/ACT nasal spray Place 1 spray into both nostrils daily as needed for allergies or rhinitis.    [provider]  Fluticasone-Salmeterol (ADVAIR) 250-50 MCG/DOSE AEPB Inhale 1 puff into the lungs 2 (two) times daily.    [provider]  Glycerin-Polysorbate 80 (REFRESH DRY EYE THERAPY OP) Apply 1 drop to eye daily as needed (dry eyes).    [provider]  HYDROcodone-acetaminophen (NORCO/VICODIN) 5-325 MG tablet Take 1-2 tablets by mouth every 6 (six) hours as needed. 10/18/17   Montine Circle, PA-C  HYDROmorphone (DILAUDID) 2 MG tablet Take 1-2 tabs po q4-6 hours prn pain 09/27/16   Aundra Dubin, PA-C  lisinopril (PRINIVIL,ZESTRIL) 20 MG tablet Take 20 mg by mouth daily.    [provider]  methocarbamol (ROBAXIN) 500 MG tablet Take 1 tablet (500 mg total) by mouth 3 (three) times daily as needed. 01/08/15   Netta Cedars, MD  montelukast (SINGULAIR) 10 MG tablet Take 10 mg by mouth at bedtime.    [provider]  ondansetron (ZOFRAN) 4 MG tablet Take 1 tablet (4 mg total) by mouth every 8 (eight) hours as needed for nausea or vomiting. 09/27/16   Aundra Dubin, PA-C  predniSONE (DELTASONE) 20 MG tablet Take 2 tablets (40 mg total) by mouth daily. 07/28/18   Muthersbaugh, Jarrett Soho, PA-C  RESTASIS MULTIDOSE 0.05 % ophthalmic emulsion Place 1 drop into both eyes daily. 08/14/16   [provider]  traMADol (ULTRAM) 50 MG tablet Take 1 tablet (50 mg total) by mouth every 6 (six) hours as needed. 12/21/17   Kinnie Feil, PA-C  traZODone (DESYREL)  100 MG tablet Take 100 mg by mouth at bedtime.    [provider]  triamcinolone cream (KENALOG) 0.1 % Apply 1 application topically 2 (two) times daily.    [provider]  verapamil (VERELAN PM) 240 MG 24 hr capsule Take 240 mg by mouth at bedtime.     [provider]    Family History Family History  Problem Relation Age of Onset  . Cancer Father   . Breast cancer Sister   . Hypertension Other        family history  . Cancer Other        family history  . Drug abuse Other  family history  . Other Other        family history of psychiatric problems, disabilities    Social History Social History   Tobacco Use  . Smoking status: Former Smoker    Packs/day: 0.50    Years: 17.00    Pack years: 8.50    Types: Cigarettes    Last attempt to quit: 11/20/2012    Years since quitting: 6.1  . Smokeless tobacco: Former Network engineer Use Topics  . Alcohol use: No  . Drug use: No     Allergies   Penicillins; Tomato; and Oxycodone   Review of Systems Review of Systems  Constitutional: Positive for chills, fatigue and fever.  HENT: Negative for sore throat.   Eyes: Negative for visual disturbance.  Respiratory: Positive for shortness of breath.   Cardiovascular: Positive for chest pain.  Gastrointestinal: Positive for nausea. Negative for abdominal pain and vomiting.  Genitourinary: Negative for dysuria.  Musculoskeletal: Positive for myalgias. Negative for neck pain.  Skin: Negative for rash.  Neurological: Negative for headaches.     Physical Exam Updated Vital Signs BP (!) 143/77   Pulse 90   Resp 18   SpO2 99%   Physical Exam Vitals signs and nursing note reviewed.  Constitutional:      General: She is not in acute distress.    Appearance: She is well-developed.  HENT:     Head: Normocephalic and atraumatic.     Mouth/Throat:     Pharynx: Oropharynx is clear. No oropharyngeal exudate.  Eyes:     Conjunctiva/sclera:  Conjunctivae normal.  Neck:     Musculoskeletal: Neck supple.  Cardiovascular:     Rate and Rhythm: Normal rate and regular rhythm.     Heart sounds: No murmur.  Pulmonary:     Effort: Pulmonary effort is normal. No respiratory distress.     Breath sounds: Normal breath sounds.  Abdominal:     Palpations: Abdomen is soft.     Tenderness: There is no abdominal tenderness. There is no guarding.  Musculoskeletal: Normal range of motion.        General: No signs of injury.     Right lower leg: No edema.     Left lower leg: No edema.  Skin:    General: Skin is warm and dry.     Capillary Refill: Capillary refill takes less than 2 seconds.  Neurological:     General: No focal deficit present.     Mental Status: She is alert and oriented to person, place, and time.     Motor: No weakness.      ED Treatments / Results  Labs (all labs ordered are listed, but only abnormal results are displayed) Labs Reviewed  BASIC METABOLIC PANEL - Abnormal; Notable for the following components:      Result Value   Calcium 8.7 (*)    All other components within normal limits  CBC WITH DIFFERENTIAL/PLATELET - Abnormal; Notable for the following components:   Monocytes Absolute 1.1 (*)    All other components within normal limits  TROPONIN I  INFLUENZA PANEL BY PCR (TYPE A & B)    EKG EKG Interpretation  Date/Time:  Saturday January 04 2019 21:43:02 EST Ventricular Rate:  92 PR Interval:    QRS Duration: 83 QT Interval:  347 QTC Calculation: 430 R Axis:   45 Text Interpretation:  Sinus rhythm no scute st/ts compared with prior 10/17 Confirmed by Aletta Edouard [phone]) on 01/04/2019 9:58:12 PM  Radiology Dg Chest 2 View  Result Date: 01/04/2019 CLINICAL DATA:  Cough and chest tightness EXAM: CHEST - 2 VIEW COMPARISON:  July 27, 2018 FINDINGS: The heart size and mediastinal contours are stable. There is no focal infiltrate, pulmonary edema, or pleural effusion. The visualized  skeletal structures are stable. IMPRESSION: No active cardiopulmonary disease. Electronically Signed   By: Abelardo Diesel M.D.   On: 01/04/2019 22:44    Procedures Procedures (including critical care time)  Medications Ordered in ED Medications  ipratropium-albuterol (DUONEB) 0.5-2.5 (3) MG/3ML nebulizer solution 3 mL (3 mLs Nebulization Given 01/04/19 2252)  ibuprofen (ADVIL,MOTRIN) tablet 600 mg (600 mg Oral Given 01/05/19 0012)     Initial Impression / Assessment and Plan / ED Course  I have reviewed the triage vital signs and the nursing notes.  Pertinent labs & imaging results that were available during my care of the patient were reviewed by me and considered in my medical decision making (see chart for details).  Clinical Course as of Jan 06 1116  Sat Jan 04, 5969  6672 58 year old female with history of COPD here with increased shortness of breath and cough with some chest pain with coughing.  Some of this sounds very flulike and she had a positive sick contact with flu.  She did get her flu shot this year.   [MB]  2255 Chest x-ray troponin and EKG unremarkable.  No elevated white count normal chemistries.  Flu is pending.  Doubt PE as she is anticoagulated on apixaban, sats are 100% on room air.   [MB]    Clinical Course User Index [MB] Hayden Rasmussen, MD     Final Clinical Impressions(s) / ED Diagnoses   Final diagnoses:  Shortness of breath  Nonspecific chest pain    ED Discharge Orders         Ordered    doxycycline (VIBRAMYCIN) 100 MG capsule  2 times daily     01/05/19 0055    predniSONE (DELTASONE) 10 MG tablet  Daily     01/05/19 0055    ibuprofen (ADVIL,MOTRIN) 400 MG tablet  Every 6 hours PRN     01/05/19 0055    acetaminophen (TYLENOL 8 HOUR) 650 MG CR tablet  Every 8 hours PRN     01/05/19 0055           Hayden Rasmussen, MD 01/05/19 1118

## 2019-01-04 NOTE — ED Triage Notes (Signed)
Onset yesterday cough, shortness of breath, and chest pain.

## 2019-01-04 NOTE — ED Provider Notes (Signed)
  Physical Exam  BP 109/73   Pulse 89   Temp 98.1 F (36.7 C) (Oral)   Resp (!) 24   SpO2 100%   Physical Exam  ED Course/Procedures   Clinical Course as of Jan 04 2349  Sat Jan 04, 8131  2067 58 year old female with history of COPD here with increased shortness of breath and cough with some chest pain with coughing.  Some of this sounds very flulike and she had a positive sick contact with flu.  She did get her flu shot this year.   [MB]  2255 Chest x-ray troponin and EKG unremarkable.  No elevated white count normal chemistries.  Flu is pending.  Doubt PE as she is anticoagulated on apixaban, sats are 100% on room air.   [MB]    Clinical Course User Index [MB] Hayden Rasmussen, MD    Procedures  MDM   Assuming care of patient from Dr. Melina Copa.   Patient in the ED for flu like symptoms. Workup thus far shows normal CXR and labs.  Concerning findings are as following: none. Important pending results are flu swab.  According to Dr. Melina Copa, plan is to d/c with tamiflu if + for flu otherwise doxy and steroids.   Patient had no complains, no concerns from the nursing side. Will continue to monitor.      Varney Biles, MD 01/04/19 2351

## 2019-01-04 NOTE — Discharge Instructions (Addendum)
You were seen in the emergency department for cough shortness of breath chest pain body aches.  You had blood work EKG and chest x-ray.  There is no evidence of any heart injury and no pneumonia. Flu test is negative.  Take supportive meds that are prescribed. Return to the ER if the symptoms are getting worse.

## 2019-01-05 LAB — INFLUENZA PANEL BY PCR (TYPE A & B)
Influenza A By PCR: NEGATIVE
Influenza B By PCR: NEGATIVE

## 2019-01-05 MED ORDER — DOXYCYCLINE HYCLATE 100 MG PO CAPS: 100.0000 mg | ORAL_CAPSULE | Freq: Two times a day (BID) | ORAL | 0 refills | Status: DC

## 2019-01-05 MED ORDER — IBUPROFEN 400 MG PO TABS: 400.0000 mg | ORAL_TABLET | Freq: Four times a day (QID) | ORAL | 0 refills | Status: DC | PRN

## 2019-01-05 MED ORDER — ACETAMINOPHEN ER 650 MG PO TBCR: 650.0000 mg | EXTENDED_RELEASE_TABLET | Freq: Three times a day (TID) | ORAL | 0 refills | Status: DC | PRN

## 2019-01-05 MED ORDER — IBUPROFEN 400 MG PO TABS
600.0000 mg | ORAL_TABLET | Freq: Once | ORAL | Status: AC
  Administered 2019-01-05: 600 mg via ORAL
  Filled 2019-01-05: qty 1

## 2019-01-05 MED ORDER — PREDNISONE 10 MG PO TABS: 50.0000 mg | ORAL_TABLET | Freq: Every day | ORAL | 0 refills | Status: DC

## 2019-01-05 NOTE — ED Notes (Signed)
Patient verbalizes understanding of discharge instructions. Opportunity for questioning and answers were provided. Armband removed by staff, pt discharged from ED ambulatory.   

## 2019-01-07 ENCOUNTER — Telehealth: Payer: Self-pay | Admitting: Gastroenterology

## 2019-01-07 NOTE — Telephone Encounter (Signed)
Hi Dr. Silverio Decamp, we have received a referral from pt's PCP for pt to have a repeat colon. Pt had previous colon in 2014, we received those records and they will be placed on your desk for review. Please advise on scheduling. Thank you.

## 2019-01-14 NOTE — Telephone Encounter (Signed)
Pt called to ask for an update on records review.

## 2019-01-15 NOTE — Telephone Encounter (Signed)
Do you know where the records are? Please give them to [place]. Thanks

## 2019-01-22 NOTE — Telephone Encounter (Signed)
As per Dr. Silverio Decamp: Pt due for colonoscopy December 2024.

## 2019-01-27 ENCOUNTER — Ambulatory Visit
Admission: RE | Admit: 2019-01-27 | Discharge: 2019-01-27 | Disposition: A | Discharge status: Home / Self Care | Payer: Medicaid Other | Source: Ambulatory Visit | Attending: Specialist | Admitting: Specialist

## 2019-01-27 DIAGNOSIS — Z1231 Encounter for screening mammogram for malignant neoplasm of breast: Secondary | ICD-10-CM

## 2019-02-27 ENCOUNTER — Encounter (HOSPITAL_BASED_OUTPATIENT_CLINIC_OR_DEPARTMENT_OTHER): Payer: Self-pay

## 2019-02-27 ENCOUNTER — Ambulatory Visit (HOSPITAL_BASED_OUTPATIENT_CLINIC_OR_DEPARTMENT_OTHER): Admit: 2019-02-27 | Payer: Medicaid Other | Admitting: Orthopaedic Surgery

## 2019-02-27 SURGERY — ARTHROSCOPY, SHOULDER
Anesthesia: Regional | Laterality: Left

## 2019-03-17 ENCOUNTER — Other Ambulatory Visit (HOSPITAL_BASED_OUTPATIENT_CLINIC_OR_DEPARTMENT_OTHER): Payer: Self-pay

## 2019-03-17 DIAGNOSIS — R0683 Snoring: Secondary | ICD-10-CM

## 2019-03-17 DIAGNOSIS — R5383 Other fatigue: Secondary | ICD-10-CM

## 2019-04-28 ENCOUNTER — Other Ambulatory Visit (HOSPITAL_COMMUNITY)
Admission: RE | Admit: 2019-04-28 | Discharge: 2019-04-28 | Disposition: A | Discharge status: Home / Self Care | Payer: Medicaid Other | Source: Ambulatory Visit | Attending: Internal Medicine | Admitting: Internal Medicine

## 2019-04-28 DIAGNOSIS — Z1159 Encounter for screening for other viral diseases: Secondary | ICD-10-CM | POA: Insufficient documentation

## 2019-04-28 LAB — SARS CORONAVIRUS 2 BY RT PCR (HOSPITAL ORDER, PERFORMED IN ~~LOC~~ HOSPITAL LAB): SARS Coronavirus 2: NEGATIVE

## 2019-04-30 ENCOUNTER — Ambulatory Visit (HOSPITAL_BASED_OUTPATIENT_CLINIC_OR_DEPARTMENT_OTHER): Payer: Medicaid Other | Attending: Specialist | Admitting: Internal Medicine

## 2019-04-30 ENCOUNTER — Other Ambulatory Visit: Payer: Self-pay

## 2019-04-30 VITALS — Ht 60.0 in | Wt 230.0 lb

## 2019-04-30 DIAGNOSIS — R5383 Other fatigue: Secondary | ICD-10-CM | POA: Diagnosis not present

## 2019-04-30 DIAGNOSIS — R0683 Snoring: Secondary | ICD-10-CM | POA: Diagnosis not present

## 2019-05-04 DIAGNOSIS — R0683 Snoring: Secondary | ICD-10-CM | POA: Diagnosis not present

## 2019-05-04 NOTE — Procedures (Signed)
    Patient Name: Amanda Christensen, Amanda Christensen Date: 04/30/2019 Gender: Female D.O.B: 20-Mar-1961 Age (years): 57 Referring Provider: Lillia Corporal Height (inches): 60 Interpreting Physician: Baird Lyons MD, ABSM Weight (lbs): 230 RPSGT: Gwenyth Allegra BMI: 45 MRN: [id number] Neck Size: 16.00  CLINICAL INFORMATION Sleep Study Type: NPSG Indication for sleep study: COPD, Fatigue, Snoring Epworth Sleepiness Score: 2  SLEEP STUDY TECHNIQUE As per the AASM Manual for the Scoring of Sleep and Associated Events v2.3 (April 2016) with a hypopnea requiring 4% desaturations.  The channels recorded and monitored were frontal, central and occipital EEG, electrooculogram (EOG), submentalis EMG (chin), nasal and oral airflow, thoracic and abdominal wall motion, anterior tibialis EMG, snore microphone, electrocardiogram, and pulse oximetry.  MEDICATIONS Medications self-administered by patient taken the night of the study : trazodone  SLEEP ARCHITECTURE The study was initiated at 9:41:19 PM and ended at 4:30:01 AM.  Sleep onset time was 102.9 minutes and the sleep efficiency was 71.0%%. The total sleep time was 290.3 minutes.  Stage REM latency was 91.0 minutes.  The patient spent 3.3%% of the night in stage N1 sleep, 71.6%% in stage N2 sleep, 0.0%% in stage N3 and 25.1% in REM.  Alpha intrusion was absent.  Supine sleep was 0.34%.  RESPIRATORY PARAMETERS The overall apnea/hypopnea index (AHI) was 0.0 per hour. There were 0 total apneas, including 0 obstructive, 0 central and 0 mixed apneas. There were 0 hypopneas and 0 RERAs.  The AHI during Stage REM sleep was 0.0 per hour.  AHI while supine was 0.0 per hour.  The mean oxygen saturation was 92.6%. The minimum SpO2 during sleep was 88.0%.  soft snoring was noted during this study.  CARDIAC DATA The 2 lead EKG demonstrated sinus rhythm. The mean heart rate was 74.3 beats per minute. Other EKG findings include: None.  LEG MOVEMENT  DATA The total PLMS were 0 with a resulting PLMS index of 0.0. Associated arousal with leg movement index was 0.0 .  IMPRESSIONS - No significant obstructive sleep apnea occurred during this study (AHI = 0.0/h). - No significant central sleep apnea occurred during this study (CAI = 0.0/h). - The patient had minimal or no oxygen desaturation during the study (Min O2 = 88.0%) - The patient snored with soft snoring volume. - No cardiac abnormalities were noted during this study. - Clinically significant periodic limb movements did not occur during sleep. No significant associated arousals. - Sleep architecture was unremarkable after trazodone, with sleep onset 11:00 PM.  DIAGNOSIS - Normal study  RECOMMENDATIONS - Suggest managing as insomnia. - Sleep hygiene should be reviewed to assess factors that may improve sleep quality. - Weight management and regular exercise should be initiated or continued if appropriate.  [Electronically signed] 05/04/2019 01:56 PM  Baird Lyons MD, [place], American Board of Sleep Medicine   NPI: [id number]                         [place], [place] [facility]  ELECTRONICALLY SIGNED ON:  05/04/2019, 1:53 PM [place] PH: [phone]   FX: [phone] [place]

## 2019-05-09 ENCOUNTER — Other Ambulatory Visit: Payer: Self-pay

## 2019-05-09 ENCOUNTER — Encounter (HOSPITAL_BASED_OUTPATIENT_CLINIC_OR_DEPARTMENT_OTHER): Payer: Self-pay | Admitting: *Deleted

## 2019-05-12 ENCOUNTER — Encounter (HOSPITAL_BASED_OUTPATIENT_CLINIC_OR_DEPARTMENT_OTHER)
Admission: RE | Admit: 2019-05-12 | Discharge: 2019-05-12 | Disposition: A | Discharge status: Home / Self Care | Payer: Medicaid Other | Source: Ambulatory Visit | Attending: Orthopaedic Surgery | Admitting: Orthopaedic Surgery

## 2019-05-12 ENCOUNTER — Other Ambulatory Visit (HOSPITAL_COMMUNITY)
Admission: RE | Admit: 2019-05-12 | Discharge: 2019-05-12 | Disposition: A | Discharge status: Home / Self Care | Payer: Medicaid Other | Source: Ambulatory Visit | Attending: Orthopaedic Surgery | Admitting: Orthopaedic Surgery

## 2019-05-12 DIAGNOSIS — M7502 Adhesive capsulitis of left shoulder: Secondary | ICD-10-CM | POA: Diagnosis not present

## 2019-05-12 DIAGNOSIS — Z1159 Encounter for screening for other viral diseases: Secondary | ICD-10-CM | POA: Insufficient documentation

## 2019-05-12 DIAGNOSIS — Z01812 Encounter for preprocedural laboratory examination: Secondary | ICD-10-CM | POA: Insufficient documentation

## 2019-05-12 LAB — BASIC METABOLIC PANEL
Anion gap: 8 (ref 5–15)
BUN: 13 mg/dL (ref 6–20)
CO2: 29 mmol/L (ref 22–32)
Calcium: 9.9 mg/dL (ref 8.9–10.3)
Chloride: 103 mmol/L (ref 98–111)
Creatinine, Ser: 1.07 mg/dL — ABNORMAL HIGH (ref 0.44–1.00)
GFR calc Af Amer: >60 mL/min (ref >60)
GFR calc non Af Amer: 57 mL/min — ABNORMAL LOW (ref >60)
Glucose, Bld: 106 mg/dL — ABNORMAL HIGH (ref 70–99)
Potassium: 4.9 mmol/L (ref 3.5–5.1)
Sodium: 140 mmol/L (ref 135–145)

## 2019-05-12 LAB — SARS CORONAVIRUS 2 (TAT 6-24 HRS): SARS Coronavirus 2: NEGATIVE

## 2019-05-12 NOTE — Progress Notes (Signed)
Ensure Pre-Surgery drink given to patient with instructions to complete by 0430 DOS.  Patient verbalized understanding of instructions.

## 2019-05-15 ENCOUNTER — Ambulatory Visit (HOSPITAL_BASED_OUTPATIENT_CLINIC_OR_DEPARTMENT_OTHER): Payer: Medicaid Other | Admitting: Certified Registered"

## 2019-05-15 ENCOUNTER — Encounter (HOSPITAL_BASED_OUTPATIENT_CLINIC_OR_DEPARTMENT_OTHER): Payer: Self-pay | Admitting: *Deleted

## 2019-05-15 ENCOUNTER — Other Ambulatory Visit: Payer: Self-pay

## 2019-05-15 ENCOUNTER — Ambulatory Visit (HOSPITAL_BASED_OUTPATIENT_CLINIC_OR_DEPARTMENT_OTHER)
Admission: RE | Admit: 2019-05-15 | Discharge: 2019-05-15 | Disposition: A | Discharge status: Home / Self Care | Payer: Medicaid Other | Attending: Orthopaedic Surgery | Admitting: Orthopaedic Surgery

## 2019-05-15 ENCOUNTER — Encounter (HOSPITAL_BASED_OUTPATIENT_CLINIC_OR_DEPARTMENT_OTHER)
Admission: RE | Disposition: A | Discharge status: Home / Self Care | Payer: Self-pay | Source: Home / Self Care | Attending: Orthopaedic Surgery

## 2019-05-15 DIAGNOSIS — G4700 Insomnia, unspecified: Secondary | ICD-10-CM | POA: Insufficient documentation

## 2019-05-15 DIAGNOSIS — Z818 Family history of other mental and behavioral disorders: Secondary | ICD-10-CM | POA: Insufficient documentation

## 2019-05-15 DIAGNOSIS — F329 Major depressive disorder, single episode, unspecified: Secondary | ICD-10-CM | POA: Diagnosis not present

## 2019-05-15 DIAGNOSIS — I739 Peripheral vascular disease, unspecified: Secondary | ICD-10-CM | POA: Diagnosis not present

## 2019-05-15 DIAGNOSIS — M24612 Ankylosis, left shoulder: Secondary | ICD-10-CM | POA: Diagnosis not present

## 2019-05-15 DIAGNOSIS — X58XXXA Exposure to other specified factors, initial encounter: Secondary | ICD-10-CM | POA: Diagnosis not present

## 2019-05-15 DIAGNOSIS — Z809 Family history of malignant neoplasm, unspecified: Secondary | ICD-10-CM | POA: Insufficient documentation

## 2019-05-15 DIAGNOSIS — Z79899 Other long term (current) drug therapy: Secondary | ICD-10-CM | POA: Diagnosis not present

## 2019-05-15 DIAGNOSIS — T8189XA Other complications of procedures, not elsewhere classified, initial encounter: Secondary | ICD-10-CM | POA: Diagnosis not present

## 2019-05-15 DIAGNOSIS — Z8249 Family history of ischemic heart disease and other diseases of the circulatory system: Secondary | ICD-10-CM | POA: Insufficient documentation

## 2019-05-15 DIAGNOSIS — J449 Chronic obstructive pulmonary disease, unspecified: Secondary | ICD-10-CM | POA: Insufficient documentation

## 2019-05-15 DIAGNOSIS — Z7951 Long term (current) use of inhaled steroids: Secondary | ICD-10-CM | POA: Insufficient documentation

## 2019-05-15 DIAGNOSIS — K219 Gastro-esophageal reflux disease without esophagitis: Secondary | ICD-10-CM | POA: Diagnosis not present

## 2019-05-15 DIAGNOSIS — Z91018 Allergy to other foods: Secondary | ICD-10-CM | POA: Insufficient documentation

## 2019-05-15 DIAGNOSIS — G8929 Other chronic pain: Secondary | ICD-10-CM | POA: Diagnosis not present

## 2019-05-15 DIAGNOSIS — Z6841 Body Mass Index (BMI) 40.0 and over, adult: Secondary | ICD-10-CM | POA: Diagnosis not present

## 2019-05-15 DIAGNOSIS — Z87891 Personal history of nicotine dependence: Secondary | ICD-10-CM | POA: Diagnosis not present

## 2019-05-15 DIAGNOSIS — Z96653 Presence of artificial knee joint, bilateral: Secondary | ICD-10-CM | POA: Diagnosis not present

## 2019-05-15 DIAGNOSIS — M1990 Unspecified osteoarthritis, unspecified site: Secondary | ICD-10-CM | POA: Diagnosis not present

## 2019-05-15 DIAGNOSIS — Z86718 Personal history of other venous thrombosis and embolism: Secondary | ICD-10-CM | POA: Insufficient documentation

## 2019-05-15 DIAGNOSIS — I10 Essential (primary) hypertension: Secondary | ICD-10-CM | POA: Insufficient documentation

## 2019-05-15 DIAGNOSIS — F419 Anxiety disorder, unspecified: Secondary | ICD-10-CM | POA: Insufficient documentation

## 2019-05-15 DIAGNOSIS — R011 Cardiac murmur, unspecified: Secondary | ICD-10-CM | POA: Diagnosis not present

## 2019-05-15 DIAGNOSIS — Z8489 Family history of other specified conditions: Secondary | ICD-10-CM | POA: Insufficient documentation

## 2019-05-15 DIAGNOSIS — Z803 Family history of malignant neoplasm of breast: Secondary | ICD-10-CM | POA: Diagnosis not present

## 2019-05-15 DIAGNOSIS — Z880 Allergy status to penicillin: Secondary | ICD-10-CM | POA: Insufficient documentation

## 2019-05-15 DIAGNOSIS — M7502 Adhesive capsulitis of left shoulder: Secondary | ICD-10-CM | POA: Diagnosis present

## 2019-05-15 HISTORY — PX: SHOULDER ARTHROSCOPY: SHX128

## 2019-05-15 HISTORY — DX: Gastro-esophageal reflux disease without esophagitis: K21.9

## 2019-05-15 HISTORY — PX: SYNOVIAL BIOPSY: SHX5041

## 2019-05-15 SURGERY — ARTHROSCOPY, SHOULDER
Anesthesia: General | Site: Shoulder | Laterality: Left

## 2019-05-15 MED ORDER — DEXAMETHASONE SODIUM PHOSPHATE 10 MG/ML IJ SOLN
INTRAMUSCULAR | Status: AC
  Filled 2019-05-15: qty 1

## 2019-05-15 MED ORDER — SUGAMMADEX SODIUM 500 MG/5ML IV SOLN
INTRAVENOUS | Status: DC | PRN
  Administered 2019-05-15: 400 mg via INTRAVENOUS

## 2019-05-15 MED ORDER — FENTANYL CITRATE (PF) 100 MCG/2ML IJ SOLN
INTRAMUSCULAR | Status: AC
  Filled 2019-05-15: qty 2

## 2019-05-15 MED ORDER — SUCCINYLCHOLINE CHLORIDE 20 MG/ML IJ SOLN
INTRAMUSCULAR | Status: DC | PRN
  Administered 2019-05-15: 140 mg via INTRAVENOUS

## 2019-05-15 MED ORDER — ASPIRIN 81 MG PO CHEW: 81.0000 mg | CHEWABLE_TABLET | Freq: Two times a day (BID) | ORAL | 11 refills | Status: DC

## 2019-05-15 MED ORDER — FENTANYL CITRATE (PF) 100 MCG/2ML IJ SOLN
25.0000 ug | INTRAMUSCULAR | Status: DC | PRN
  Administered 2019-05-15: 50 ug via INTRAVENOUS
  Administered 2019-05-15: 25 ug via INTRAVENOUS

## 2019-05-15 MED ORDER — ONDANSETRON HCL 4 MG/2ML IJ SOLN
INTRAMUSCULAR | Status: AC
  Filled 2019-05-15: qty 2

## 2019-05-15 MED ORDER — MIDAZOLAM HCL 2 MG/2ML IJ SOLN
INTRAMUSCULAR | Status: AC
  Filled 2019-05-15: qty 2

## 2019-05-15 MED ORDER — CELECOXIB 100 MG PO CAPS: 100.0000 mg | ORAL_CAPSULE | Freq: Two times a day (BID) | ORAL | 2 refills | Status: DC

## 2019-05-15 MED ORDER — CHLORHEXIDINE GLUCONATE 4 % EX LIQD: 60.0000 mL | Freq: Once | CUTANEOUS | Status: DC

## 2019-05-15 MED ORDER — ONDANSETRON 4 MG PO TBDP
ORAL_TABLET | ORAL | Status: AC
  Filled 2019-05-15: qty 1

## 2019-05-15 MED ORDER — FENTANYL CITRATE (PF) 100 MCG/2ML IJ SOLN
50.0000 ug | INTRAMUSCULAR | Status: DC | PRN
  Administered 2019-05-15: 50 ug via INTRAVENOUS

## 2019-05-15 MED ORDER — BUPIVACAINE HCL (PF) 0.25 % IJ SOLN
INTRAMUSCULAR | Status: AC
  Filled 2019-05-15: qty 60

## 2019-05-15 MED ORDER — ROCURONIUM BROMIDE 100 MG/10ML IV SOLN
INTRAVENOUS | Status: DC | PRN
  Administered 2019-05-15: 50 mg via INTRAVENOUS

## 2019-05-15 MED ORDER — ACETAMINOPHEN 500 MG PO TABS: 1000.0000 mg | ORAL_TABLET | Freq: Three times a day (TID) | ORAL | 0 refills | Status: AC

## 2019-05-15 MED ORDER — SODIUM CHLORIDE 0.9 % IR SOLN
Status: DC | PRN
  Administered 2019-05-15: 5500 mL

## 2019-05-15 MED ORDER — PROMETHAZINE HCL 25 MG/ML IJ SOLN: 6.2500 mg | INTRAMUSCULAR | Status: DC | PRN

## 2019-05-15 MED ORDER — SODIUM CHLORIDE 0.9 % IV SOLN
INTRAVENOUS | Status: DC | PRN
  Administered 2019-05-15: 40 ug/min via INTRAVENOUS

## 2019-05-15 MED ORDER — EPHEDRINE SULFATE 50 MG/ML IJ SOLN
INTRAMUSCULAR | Status: DC | PRN
  Administered 2019-05-15 (×3): 15 mg via INTRAVENOUS

## 2019-05-15 MED ORDER — ONDANSETRON HCL 4 MG/2ML IJ SOLN
INTRAMUSCULAR | Status: DC | PRN
  Administered 2019-05-15: 4 mg via INTRAVENOUS

## 2019-05-15 MED ORDER — OXYCODONE HCL 5 MG PO TABS: ORAL_TABLET | ORAL | 0 refills | Status: AC

## 2019-05-15 MED ORDER — BUPIVACAINE LIPOSOME 1.3 % IJ SUSP
INTRAMUSCULAR | Status: DC | PRN
  Administered 2019-05-15: 10 mL via PERINEURAL

## 2019-05-15 MED ORDER — MIDAZOLAM HCL 2 MG/2ML IJ SOLN
1.0000 mg | INTRAMUSCULAR | Status: DC | PRN
  Administered 2019-05-15: 2 mg via INTRAVENOUS

## 2019-05-15 MED ORDER — SCOPOLAMINE 1 MG/3DAYS TD PT72: 1.0000 | MEDICATED_PATCH | Freq: Once | TRANSDERMAL | Status: DC

## 2019-05-15 MED ORDER — PROPOFOL 10 MG/ML IV BOLUS
INTRAVENOUS | Status: DC | PRN
  Administered 2019-05-15: 250 mg via INTRAVENOUS

## 2019-05-15 MED ORDER — LIDOCAINE 2% (20 MG/ML) 5 ML SYRINGE
INTRAMUSCULAR | Status: AC
  Filled 2019-05-15: qty 5

## 2019-05-15 MED ORDER — ONDANSETRON HCL 4 MG PO TABS: 4.0000 mg | ORAL_TABLET | Freq: Three times a day (TID) | ORAL | 1 refills | Status: AC | PRN

## 2019-05-15 MED ORDER — EPINEPHRINE PF 1 MG/ML IJ SOLN
INTRAMUSCULAR | Status: AC
  Filled 2019-05-15: qty 5

## 2019-05-15 MED ORDER — ROCURONIUM BROMIDE 10 MG/ML (PF) SYRINGE
PREFILLED_SYRINGE | INTRAVENOUS | Status: AC
  Filled 2019-05-15: qty 10

## 2019-05-15 MED ORDER — DEXAMETHASONE SODIUM PHOSPHATE 4 MG/ML IJ SOLN
INTRAMUSCULAR | Status: DC | PRN
  Administered 2019-05-15: 8 mg via INTRAVENOUS

## 2019-05-15 MED ORDER — PHENYLEPHRINE HCL (PRESSORS) 10 MG/ML IV SOLN
INTRAVENOUS | Status: DC | PRN
  Administered 2019-05-15 (×2): 120 ug via INTRAVENOUS
  Administered 2019-05-15: 80 ug via INTRAVENOUS

## 2019-05-15 MED ORDER — VANCOMYCIN HCL IN DEXTROSE 500-5 MG/100ML-% IV SOLN
INTRAVENOUS | Status: AC
  Filled 2019-05-15: qty 100

## 2019-05-15 MED ORDER — VANCOMYCIN HCL IN DEXTROSE 1-5 GM/200ML-% IV SOLN
INTRAVENOUS | Status: AC
  Filled 2019-05-15: qty 200

## 2019-05-15 MED ORDER — ONDANSETRON 4 MG PO TBDP
4.0000 mg | ORAL_TABLET | Freq: Once | ORAL | Status: AC
  Administered 2019-05-15: 4 mg via ORAL

## 2019-05-15 MED ORDER — LACTATED RINGERS IV SOLN
INTRAVENOUS | Status: DC
  Administered 2019-05-15 (×3): via INTRAVENOUS

## 2019-05-15 MED ORDER — PROPOFOL 10 MG/ML IV BOLUS
INTRAVENOUS | Status: AC
  Filled 2019-05-15: qty 40

## 2019-05-15 MED ORDER — VANCOMYCIN HCL 10 G IV SOLR
1500.0000 mg | INTRAVENOUS | Status: AC
  Administered 2019-05-15: 1500 mg via INTRAVENOUS

## 2019-05-15 MED ORDER — BUPIVACAINE-EPINEPHRINE (PF) 0.5% -1:200000 IJ SOLN
INTRAMUSCULAR | Status: DC | PRN
  Administered 2019-05-15: 15 mL via PERINEURAL

## 2019-05-15 MED ORDER — SUCCINYLCHOLINE CHLORIDE 200 MG/10ML IV SOSY
PREFILLED_SYRINGE | INTRAVENOUS | Status: AC
  Filled 2019-05-15: qty 10

## 2019-05-15 SURGICAL SUPPLY — 82 items
AID PSTN UNV HD RSTRNT DISP (MISCELLANEOUS) ×2
APL PRP STRL LF DISP 70% ISPRP (MISCELLANEOUS) ×2
BLADE EXCALIBUR 4.0MM X 13CM (MISCELLANEOUS) ×1
BLADE EXCALIBUR 4.0X13 (MISCELLANEOUS) ×3 IMPLANT
BLADE SHAVER BONE 5.0MM X 13CM (MISCELLANEOUS)
BLADE SHAVER BONE 5.0X13 (MISCELLANEOUS) IMPLANT
BNDG COHESIVE 4X5 TAN STRL (GAUZE/BANDAGES/DRESSINGS) IMPLANT
BURR OVAL 8 FLU 4.0MM X 13CM (MISCELLANEOUS)
BURR OVAL 8 FLU 4.0X13 (MISCELLANEOUS) IMPLANT
CANNULA 5.75X71 LONG (CANNULA) ×2 IMPLANT
CANNULA PASSPORT 5 (CANNULA) IMPLANT
CANNULA PASSPORT 5CM (CANNULA)
CANNULA PASSPORT BUTTON 10-40 (CANNULA) IMPLANT
CANNULA TWIST IN 8.25X7CM (CANNULA) IMPLANT
CHLORAPREP W/TINT 26 (MISCELLANEOUS) ×4 IMPLANT
CLOSURE WOUND 1/2 X4 (GAUZE/BANDAGES/DRESSINGS) ×1
CNTNR SPEC C3OZ STD GRAD LEK (MISCELLANEOUS) IMPLANT
CONT SPEC 3OZ W/LID STRL (MISCELLANEOUS) ×8
COVER WAND RF STERILE (DRAPES) IMPLANT
DECANTER SPIKE VIAL GLASS SM (MISCELLANEOUS) IMPLANT
DISSECTOR 3.5MM X 13CM CVD (MISCELLANEOUS) IMPLANT
DISSECTOR 4.0MMX13CM CVD (MISCELLANEOUS) IMPLANT
DRAPE IMP U-DRAPE 54X76 (DRAPES) ×4 IMPLANT
DRAPE INCISE IOBAN 66X45 STRL (DRAPES) IMPLANT
DRAPE SHOULDER BEACH CHAIR (DRAPES) ×4 IMPLANT
DRSG PAD ABDOMINAL 8X10 ST (GAUZE/BANDAGES/DRESSINGS) ×4 IMPLANT
DW OUTFLOW CASSETTE/TUBE SET (MISCELLANEOUS) ×2 IMPLANT
ELECT NDL TIP 2.8 STRL (NEEDLE) IMPLANT
ELECT NEEDLE TIP 2.8 STRL (NEEDLE) IMPLANT
ELECT REM PT RETURN 9FT ADLT (ELECTROSURGICAL)
ELECTRODE REM PT RTRN 9FT ADLT (ELECTROSURGICAL) ×2 IMPLANT
GAUZE SPONGE 4X4 12PLY STRL (GAUZE/BANDAGES/DRESSINGS) ×4 IMPLANT
GAUZE XEROFORM 1X8 LF (GAUZE/BANDAGES/DRESSINGS) IMPLANT
GLOVE BIOGEL PI IND STRL 7.0 (GLOVE) IMPLANT
GLOVE BIOGEL PI IND STRL 8 (GLOVE) ×2 IMPLANT
GLOVE BIOGEL PI INDICATOR 7.0 (GLOVE) ×4
GLOVE BIOGEL PI INDICATOR 8 (GLOVE) ×2
GLOVE ECLIPSE 6.5 STRL STRAW (GLOVE) ×4 IMPLANT
GLOVE ECLIPSE 8.0 STRL XLNG CF (GLOVE) ×4 IMPLANT
GOWN STRL REUS W/ TWL LRG LVL3 (GOWN DISPOSABLE) ×2 IMPLANT
GOWN STRL REUS W/TWL LRG LVL3 (GOWN DISPOSABLE) ×4
GOWN STRL REUS W/TWL XL LVL3 (GOWN DISPOSABLE) ×4 IMPLANT
IV NS IRRIG 3000ML ARTHROMATIC (IV SOLUTION) ×4 IMPLANT
KIT STABILIZATION SHOULDER (MISCELLANEOUS) ×4 IMPLANT
KIT STR SPEAR 1.8 FBRTK DISP (KITS) IMPLANT
LASSO 90 CVE QUICKPAS (DISPOSABLE) IMPLANT
MANIFOLD NEPTUNE II (INSTRUMENTS) ×4 IMPLANT
NDL SAFETY ECLIPSE 18X1.5 (NEEDLE) ×2 IMPLANT
NDL SCORPION MULTI FIRE (NEEDLE) IMPLANT
NDL SUT 6 .5 CRC .975X.05 MAYO (NEEDLE) IMPLANT
NEEDLE HYPO 18GX1.5 SHARP (NEEDLE) ×4
NEEDLE MAYO TAPER (NEEDLE)
NEEDLE SCORPION MULTI FIRE (NEEDLE) IMPLANT
PACK ARTHROSCOPY DSU (CUSTOM PROCEDURE TRAY) ×4 IMPLANT
PACK BASIN DAY SURGERY FS (CUSTOM PROCEDURE TRAY) ×4 IMPLANT
PENCIL BUTTON HOLSTER BLD 10FT (ELECTRODE) IMPLANT
PORT APPOLLO RF 90DEGREE MULTI (SURGICAL WAND) ×4 IMPLANT
RESTRAINT HEAD UNIVERSAL NS (MISCELLANEOUS) ×4 IMPLANT
SHEET MEDIUM DRAPE 40X70 STRL (DRAPES) ×4 IMPLANT
SLEEVE SCD COMPRESS KNEE MED (MISCELLANEOUS) ×4 IMPLANT
SLING ARM FOAM STRAP LRG (SOFTGOODS) IMPLANT
SLING ARM IMMOBILIZER LRG (SOFTGOODS) IMPLANT
SLING ARM IMMOBILIZER MED (SOFTGOODS) IMPLANT
SLING ARM MED ADULT FOAM STRAP (SOFTGOODS) IMPLANT
SLING ARM XL FOAM STRAP (SOFTGOODS) ×2 IMPLANT
SPONGE LAP 4X18 RFD (DISPOSABLE) IMPLANT
STRIP CLOSURE SKIN 1/2X4 (GAUZE/BANDAGES/DRESSINGS) ×1 IMPLANT
SUCTION FRAZIER HANDLE 10FR (MISCELLANEOUS)
SUCTION TUBE FRAZIER 10FR DISP (MISCELLANEOUS) IMPLANT
SUT FIBERWIRE #2 38 T-5 BLUE (SUTURE)
SUT MNCRL AB 4-0 PS2 18 (SUTURE) ×4 IMPLANT
SUT PDS AB 1 CT  36 (SUTURE)
SUT PDS AB 1 CT 36 (SUTURE) IMPLANT
SUT TIGER TAPE 7 IN WHITE (SUTURE) IMPLANT
SUTURE FIBERWR #2 38 T-5 BLUE (SUTURE) IMPLANT
SUTURE TAPE TIGERLINK 1.3MM BL (SUTURE) IMPLANT
SUTURETAPE TIGERLINK 1.3MM BL (SUTURE)
SYR 5ML LUER SLIP (SYRINGE) ×4 IMPLANT
TAPE FIBER 2MM 7IN #2 BLUE (SUTURE) IMPLANT
TOWEL GREEN STERILE FF (TOWEL DISPOSABLE) ×4 IMPLANT
TUBE SUCTION HIGH CAP CLEAR NV (SUCTIONS) IMPLANT
TUBING ARTHROSCOPY IRRIG 16FT (MISCELLANEOUS) ×4 IMPLANT

## 2019-05-15 NOTE — Anesthesia Procedure Notes (Signed)
Anesthesia Regional Block: Interscalene brachial plexus block   Pre-Anesthetic Checklist: ,, timeout performed, Correct Patient, Correct Site, Correct Laterality, Correct Procedure, Correct Position, site marked, Risks and benefits discussed,  Surgical consent,  Pre-op evaluation,  At surgeon's request and post-op pain management  Laterality: Left  Prep: chloraprep       Needles:  Injection technique: Single-shot  Needle Type: Echogenic Stimulator Needle     Needle Length: 9cm  Needle Gauge: 21     Additional Needles:   Procedures:, nerve stimulator,,, ultrasound used (permanent image in chart),,,,   Nerve Stimulator or Paresthesia:  Response: deltoid and biceps, 0.5 mA,   Additional Responses:   Narrative:  Start time: 05/15/2019 7:06 AM End time: 05/15/2019 7:13 AM Injection made incrementally with aspirations every 5 mL.  Performed by: Personally  Anesthesiologist: Suzette Battiest, MD

## 2019-05-15 NOTE — Discharge Instructions (Signed)
°Post Anesthesia Home Care Instructions ° °Activity: °Get plenty of rest for the remainder of the day. A responsible individual must stay with you for 24 hours following the procedure.  °For the next 24 hours, DO NOT: °-Drive a car °-Operate machinery °-Drink alcoholic beverages °-Take any medication unless instructed by your physician °-Make any legal decisions or sign important papers. ° °Meals: °Start with liquid foods such as gelatin or soup. Progress to regular foods as tolerated. Avoid greasy, spicy, heavy foods. If nausea and/or vomiting occur, drink only clear liquids until the nausea and/or vomiting subsides. Call your physician if vomiting continues. ° °Special Instructions/Symptoms: °Your throat may feel dry or sore from the anesthesia or the breathing tube placed in your throat during surgery. If this causes discomfort, gargle with warm salt water. The discomfort should disappear within 24 hours. ° °If you had a scopolamine patch placed behind your ear for the management of post- operative nausea and/or vomiting: ° °1. The medication in the patch is effective for 72 hours, after which it should be removed.  Wrap patch in a tissue and discard in the trash. Wash hands thoroughly with soap and water. °2. You may remove the patch earlier than 72 hours if you experience unpleasant side effects which may include dry mouth, dizziness or visual disturbances. °3. Avoid touching the patch. Wash your hands with soap and water after contact with the patch. °   ° ° °Information for Discharge Teaching: °EXPAREL (bupivacaine liposome injectable suspension)  ° °Your surgeon or anesthesiologist gave you EXPAREL(bupivacaine) to help control your pain after surgery.  °· EXPAREL is a local anesthetic that provides pain relief by numbing the tissue around the surgical site. °· EXPAREL is designed to release pain medication over time and can control pain for up to 72 hours. °· Depending on how you respond to EXPAREL, you may  require less pain medication during your recovery. ° °Possible side effects: °· Temporary loss of sensation or ability to move in the area where bupivacaine was injected. °· Nausea, vomiting, constipation °· Rarely, numbness and tingling in your mouth or lips, lightheadedness, or anxiety may occur. °· Call your doctor right away if you think you may be experiencing any of these sensations, or if you have other questions regarding possible side effects. ° °Follow all other discharge instructions given to you by your surgeon or nurse. Eat a healthy diet and drink plenty of water or other fluids. ° °If you return to the hospital for any reason within 96 hours following the administration of EXPAREL, it is important for health care providers to know that you have received this anesthetic. A teal colored band has been placed on your arm with the date, time and amount of EXPAREL you have received in order to alert and inform your health care providers. Please leave this armband in place for the full 96 hours following administration, and then you may remove the band. ° ° °Regional Anesthesia Blocks ° °1. Numbness or the inability to move the "blocked" extremity may last from 3-48 hours after placement. The length of time depends on the medication injected and your individual response to the medication. If the numbness is not going away after 48 hours, call your surgeon. ° °2. The extremity that is blocked will need to be protected until the numbness is gone and the  Strength has returned. Because you cannot feel it, you will need to take extra care to avoid injury. Because it may be weak, you   may have difficulty moving it or using it. You may not know what position it is in without looking at it while the block is in effect. ° °3. For blocks in the legs and feet, returning to weight bearing and walking needs to be done carefully. You will need to wait until the numbness is entirely gone and the strength has returned. You  should be able to move your leg and foot normally before you try and bear weight or walk. You will need someone to be with you when you first try to ensure you do not fall and possibly risk injury. ° °4. Bruising and tenderness at the needle site are common side effects and will resolve in a few days. ° °5. Persistent numbness or new problems with movement should be communicated to the surgeon or the [facility] ([phone])/ [facility] ([phone]). °

## 2019-05-15 NOTE — Progress Notes (Signed)
Assisted Dr. Rob Fitzgerald with left, ultrasound guided, interscalene  block. Side rails up, monitors on throughout procedure. See vital signs in flow sheet. Tolerated Procedure well. 

## 2019-05-15 NOTE — Anesthesia Procedure Notes (Signed)
Procedure Name: Intubation Performed by: Verita Lamb, CRNA Pre-anesthesia Checklist: Patient identified, Emergency Drugs available, Suction available, Patient being monitored and Timeout performed Patient Re-evaluated:Patient Re-evaluated prior to induction Oxygen Delivery Method: Circle system utilized Preoxygenation: Pre-oxygenation with 100% oxygen Induction Type: IV induction Ventilation: Mask ventilation without difficulty Laryngoscope Size: Mac and 3 Grade View: Grade I Tube type: Oral Tube size: 7.0 mm Number of attempts: 1 Airway Equipment and Method: Stylet Placement Confirmation: ETT inserted through vocal cords under direct vision,  positive ETCO2,  CO2 detector and breath sounds checked- equal and bilateral Secured at: 22 cm Tube secured with: Tape Dental Injury: Teeth and Oropharynx as per pre-operative assessment

## 2019-05-15 NOTE — Anesthesia Postprocedure Evaluation (Signed)
Anesthesia Post Note  Patient: [name]  Procedure(s) Performed: SHOULDER ARTHROSCOPY WITH MANIPULATION AND LYSIS OF ADHESIONS (Left Shoulder) SYNOVIAL BIOPSY (Left Shoulder)     Patient location during evaluation: PACU Anesthesia Type: General Level of consciousness: awake and alert Pain management: pain level controlled Vital Signs Assessment: post-procedure vital signs reviewed and stable Respiratory status: spontaneous breathing, nonlabored ventilation, respiratory function stable and patient connected to nasal cannula oxygen Cardiovascular status: blood pressure returned to baseline and stable Postop Assessment: no apparent nausea or vomiting Anesthetic complications: no    Last Vitals:  Vitals:   05/15/19 1015 05/15/19 1030  BP: (!) 98/58 (!) 95/54  Pulse: 78 74  Resp: (!) 24 19  Temp:    SpO2: 100% 100%    Last Pain:  Vitals:   05/15/19 1030  TempSrc:   PainSc: 5                  Tiajuana Amass

## 2019-05-15 NOTE — Op Note (Signed)
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  Patient had significant arthrofibrosis with tissue throughout the joint.  We were able to clear the joint superiorly however the inferior aspect was unreachable secondary to the implants.  We were able to manipulate the shoulder and get much more motion.  Preop the patient had 80 degrees of forward flexion and external rotation to -10 and postoperatively she had forward elevation to 130 and external rotation to 40.  Her rotator cuff appeared to be intact though obviously thin including the subscapularis and the infraspinatus and supraspinatus.  Posterior cuff looked normal in the postop setting.  The superior cuff looked reasonable as well though there is clearly arthrofibrosis of the subacromial space.  Biceps have been tenotomized.  Implants looked reasonable though full assessment was not able to be performed.  Fixation looked reasonable as well.  We open the anterior interval though it obviously looked atypical in the setting of a postop subscapularis repair.  We did feel the subscapularis was relatively superior compared to its normal insertion but this could be secondary to her scarring.  There were FiberWire sutures that were closing the interval tissue and these were released as the patient was very tight.  It was also somewhat unclear but the sutures may have captured the CA ligament as well which could have been contributing to the patient's external rotation contracture.  Patient's external rotation improved greatly after these  released.  Cultures obtained x3 sent for rule out P acnes in addition to other pathologies.  These will be held for 3 weeks.  Post-operative plan: The patient will be weightbearing as tolerated with a sling for comfort for 1 to 2 days with immediate therapy.  The patient will be discharged home.  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  Pain control with PRN pain medication preferring oral medicines.  Follow up plan will be scheduled in approximately 7 days for incision check.  Post-Op Diagnosis: Same Surgeons:Primary: Hiram Gash, MD Assistants: None Location: [place] OR ROOM 6 Anesthesia: General with Exparel block Antibiotics: Ancef 2g preop Tourniquet time: * No tourniquets in log * Estimated Blood Loss: Minimal Complications: None Specimens: 3 for culture Implants: * No implants in log *  Indications for Surgery:   Amanda Christensen is a 58 y.o. female with history of left total shoulder arthroplasty remotely by another physician.  She reports she never was able to get back to normal motion had constant pain since.  She does not feel like she had gotten any better.  We did an initial work-up with an aspiration of her shoulder and labs which was inconclusive for infection and we still had a high suspicion of infection.  We felt an arthroscopic synovial biopsy with addition of lysis of adhesions would be appropriate.  This was also let us double check her rotator cuff.  Benefits and risks of operative and nonoperative management were discussed prior to surgery with patient/guardian(s) and informed  consent form was completed.  Specific risks including infection, need for additional surgery, infection from the surgery, need for explant, continued stiffness and need for  therapy.   Procedure:   Patient was correctly identified in the preoperative holding area and operative site marked.  Patient brought to OR and positioned beachchair on an [place] table ensuring that all bony prominences were padded and the head was in an appropriate location.  Anesthesia was induced and the operative shoulder was prepped and draped in the usual sterile fashion.  Timeout was called preincision.  A standard posterior viewing portal was made after localizing the portal with a spinal needle.  An anterior accessory portal was also made.  We were careful do spinal needle localization for this portal and ensure that we are above the subscapularis.  There was obviously significantly atypical appearance of the joint as there was an implant in place both on the humerus as well as the glenoid.  We thought the scarring was atypical as well and the shoulder was rather locked down.  We were able to mobilize the rotator interval tissue and clear it extensively debriding this area with a shaver as well as an arthroscopic basket.  There were multiple FiberWire sutures in the rotator interval that were released though the subscapularis appeared well-healed.  Some of the sutures appear to be tagged to the remnant CA ligament which may have been contributing to stiffness.  Patient's motion was immediately better once these were released.   Multiple synovial biopsies were taken as above from this area as well as the posterior aspect of the shoulder.  There is significant scarring in the implant was pushed tightly against the glenoid rather than having some mobility.  The superior cuff looked to be intact as did the subscapularis.  Extensive debridement was performed of the entire and articular surface to ensure that there is no arthrofibrosis tissue still remaining.  We are able to clear the anterior capsular/scar tissue to the 8 o'clock position however going lower than this was unable to be performed secondary  the patient's contractures.  We did not feel that it was safe to continue to dissect and damage to the implants.  We did this release in the posterior aspect of the shoulder which was much less contracted.  We then went subacromial and did a bursectomy and lysis of adhesions and skeletonize the acromion but did not perform a CA ligament release or a acromioplasty.  The incisions were closed with absorbable monocryl, benzoin and steri strips.  A sterile dressing was placed along with a sling. The patient was awoken from general anesthesia and taken to the PACU in stable condition without complication.

## 2019-05-15 NOTE — H&P (Signed)
PREOPERATIVE H&P  Chief Complaint: ADHESIVE CAPSULITIS OF LEFT SHOULDER  HPI: Amanda Christensen is a 58 y.o. female who presents for preoperative history and physical with a diagnosis of ADHESIVE CAPSULITIS OF LEFT SHOULDER. Symptoms are rated as moderate to severe, and have been worsening.  This is significantly impairing activities of daily living.  Please see my clinic note for full details on this patient's care.  She has elected for surgical management.   Past Medical History:  Diagnosis Date  . Arthritis   . Asthma   . Chronic pain   . COPD (chronic obstructive pulmonary disease) ([place])   . Depression    history of   . 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 GERD (gastroesophageal reflux disease)   . Heart murmur   . History of anxiety    "used to have anxiety attacks"  . History of bronchitis   . Hypertension   . Insomnia    takes Trazodone nightly  . Obesity   . Peripheral vascular disease (HCC) 10   rt leg dvt   . Seizures ([place]) 03   was on medications none now off  since 2011  . Shortness of breath dyspnea    Past Surgical History:  Procedure Laterality Date  . BREAST EXCISIONAL BIOPSY Left 09/12/2016  . BREAST LUMPECTOMY WITH NEEDLE LOCALIZATION Left 09/13/2016   Procedure: LEFT BREAST LUMPECTOMY WITH NEEDLE LOCALIZATION;  Surgeon: Clovis Riley, MD;  Location: [facility];  Service: General;  Laterality: Left;  . CESAREAN SECTION    . COLONOSCOPY    . DILATION AND CURETTAGE OF UTERUS    . HERNIA REPAIR     umbilical  . JOINT REPLACEMENT    . miscarriage    . ROTATOR CUFF REPAIR Left   . TOTAL KNEE ARTHROPLASTY Right   . TOTAL KNEE ARTHROPLASTY Left 09/27/2016   Procedure: LEFT TOTAL KNEE ARTHROPLASTY;  Surgeon: [name], MD;  Location: [facility];  Service: Orthopedics;  Laterality: Left;  . TOTAL SHOULDER ARTHROPLASTY Left 01/08/2015   dr Veverly Fells  . TOTAL SHOULDER ARTHROPLASTY Left 01/08/2015   Procedure: LEFT TOTAL  SHOULDER ARTHROPLASTY;  Surgeon: Augustin Schooling, MD;  Location: [facility];  Service: Orthopedics;  Laterality: Left;   Social History   Socioeconomic History  . Marital status: Divorced    Spouse name: Not on file  . Number of children: Not on file  . Years of education: Not on file  . Highest education level: Not on file  Occupational History  . Not on file  Social Needs  . Financial resource strain: Not on file  . Food insecurity    Worry: Not on file    Inability: Not on file  . Transportation needs    Medical: Not on file    Non-medical: Not on file  Tobacco Use  . Smoking status: Former Smoker    Packs/day: 0.50    Years: 17.00    Pack years: 8.50    Types: Cigarettes    Quit date: 11/20/2012    Years since quitting: 6.4  . Smokeless tobacco: Former Network engineer and Sexual Activity  . Alcohol use: No  . Drug use: No  . Sexual activity: Yes  Lifestyle  . Physical activity    Days per week: Not on file    Minutes per session: Not on file  . Stress: Not on file  Relationships  . Social connections  Talks on phone: Not on file    Gets together: Not on file    Attends religious service: Not on file    Active member of club or organization: Not on file    Attends meetings of clubs or organizations: Not on file    Relationship status: Not on file  Other Topics Concern  . Not on file  Social History Narrative  . Not on file   Family History  Problem Relation Age of Onset  . Cancer Father   . Breast cancer Sister   . Hypertension Other        family history  . Cancer Other        family history  . Drug abuse Other        family history  . Other Other        family history of psychiatric problems, disabilities   Allergies  Allergen Reactions  . Penicillins Other (See Comments)    syncope Has patient had a PCN reaction causing immediate rash, facial/tongue/throat swelling, SOB or lightheadedness with hypotension: Yes Has patient had a PCN reaction  causing severe rash involving mucus membranes or skin necrosis: No Has patient had a PCN reaction that required hospitalization No Has patient had a PCN reaction occurring within the last 10 years: No If all of the above answers are "NO", then may proceed with Cephalosporin use.   . Tomato Hives    From acid in food   Prior to Admission medications   Medication Sig Start Date End Date Taking? Authorizing Provider  acetaminophen (TYLENOL 8 HOUR) 650 MG CR tablet Take 1 tablet (650 mg total) by mouth every 8 (eight) hours as needed for pain or fever. 01/05/19  Yes Varney Biles, MD  albuterol (PROVENTIL HFA;VENTOLIN HFA) 108 (90 BASE) MCG/ACT inhaler Inhale 1-2 puffs into the lungs every 6 (six) hours as needed for wheezing or shortness of breath. 12/15/14  Yes Quintella Reichert, MD  Ascorbic Acid (VITAMIN C PO) Take 1 tablet by mouth at bedtime.    Yes [provider]  Cholecalciferol (VITAMIN D PO) Take 1 tablet by mouth at bedtime.    Yes [provider]  fluticasone (FLONASE) 50 MCG/ACT nasal spray Place 1 spray into both nostrils daily as needed for allergies or rhinitis.   Yes [provider]  Fluticasone-Salmeterol (ADVAIR) 250-50 MCG/DOSE AEPB Inhale 1 puff into the lungs 2 (two) times daily.   Yes [provider]  Glycerin-Polysorbate 80 (REFRESH DRY EYE THERAPY OP) Apply 1 drop to eye daily as needed (dry eyes).   Yes [provider]  montelukast (SINGULAIR) 10 MG tablet Take 10 mg by mouth at bedtime.   Yes [provider]  pantoprazole (PROTONIX) 40 MG tablet Take 40 mg by mouth daily.   Yes [provider]  RESTASIS MULTIDOSE 0.05 % ophthalmic emulsion Place 1 drop into both eyes daily. 08/14/16  Yes [provider]  traZODone (DESYREL) 100 MG tablet Take 100 mg by mouth at bedtime.   Yes [provider]  triamcinolone cream (KENALOG) 0.1 % Apply 1 application topically 2 (two) times daily.   Yes [provider]  triamterene-hydrochlorothiazide (MAXZIDE-25) 37.5-25 MG tablet Take 1 tablet by mouth daily.   Yes [provider]     Positive ROS: All other systems have been reviewed and were otherwise negative with the exception of those mentioned in the HPI and as above.  Physical Exam: General: Alert, no acute distress Cardiovascular: No pedal edema  Respiratory: No cyanosis, no use of accessory musculature GI: No organomegaly, abdomen is soft and non-tender Skin: No lesions in the area of chief complaint Neurologic: Sensation intact distally Psychiatric: Patient is competent for consent with normal mood and affect Lymphatic: No axillary or cervical lymphadenopathy  MUSCULOSKELETAL: L shoulder stiffness, cuff 4/5 due to pain  Assessment: ADHESIVE CAPSULITIS OF LEFT SHOULDER  Plan: Plan for Procedure(s): SHOULDER ARTHROSCOPY WITH MANIPULATION AND LYSIS OF ADHESIONS  The risks benefits and alternatives were discussed with the patient including but not limited to the risks of nonoperative treatment, versus surgical intervention including infection, bleeding, nerve injury,  blood clots, cardiopulmonary complications, morbidity, mortality, among others, and they were willing to proceed.   Hiram Gash, MD  05/15/2019 7:06 AM

## 2019-05-15 NOTE — Anesthesia Preprocedure Evaluation (Signed)
Anesthesia Evaluation  Patient identified by MRN, date of birth, ID band Patient awake    Reviewed: Allergy & Precautions, NPO status , Patient's Chart, lab work & pertinent test results  Airway Mallampati: II  TM Distance: >3 FB     Dental  (+) Dental Advisory Given   Pulmonary shortness of breath, asthma , COPD, former smoker,    breath sounds clear to auscultation       Cardiovascular hypertension,  Rhythm:Regular Rate:Normal     Neuro/Psych Seizures -,     GI/Hepatic Neg liver ROS, GERD  ,  Endo/Other  Morbid obesity  Renal/GU Renal disease     Musculoskeletal  (+) Arthritis ,   Abdominal   Peds  Hematology negative hematology ROS (+)   Anesthesia Other Findings   Reproductive/Obstetrics                             Lab Results  Component Value Date   WBC 4.6 01/04/2019   HGB 12.6 01/04/2019   HCT 40.7 01/04/2019   MCV 88.9 01/04/2019   PLT 186 01/04/2019   Lab Results  Component Value Date   CREATININE 1.07 (H) 05/12/2019   BUN 13 05/12/2019   NA 140 05/12/2019   K 4.9 05/12/2019   CL 103 05/12/2019   CO2 29 05/12/2019    Anesthesia Physical Anesthesia Plan  ASA: III  Anesthesia Plan: General   Post-op Pain Management:  Regional for Post-op pain   Induction: Intravenous  PONV Risk Score and Plan: 3 and Dexamethasone, Ondansetron and Treatment may vary due to age or medical condition  Airway Management Planned: Oral ETT  Additional Equipment:   Intra-op Plan:   Post-operative Plan: Extubation in OR  Informed Consent: I have reviewed the patients History and Physical, chart, labs and discussed the procedure including the risks, benefits and alternatives for the proposed anesthesia with the patient or authorized representative who has indicated his/her understanding and acceptance.     Dental advisory given  Plan Discussed with: CRNA  Anesthesia Plan  Comments:         Anesthesia Quick Evaluation

## 2019-05-15 NOTE — Transfer of Care (Signed)
Immediate Anesthesia Transfer of Care Note  Patient: Amanda Christensen  Procedure(s) Performed: SHOULDER ARTHROSCOPY WITH MANIPULATION AND LYSIS OF ADHESIONS (Left Shoulder) SYNOVIAL BIOPSY (Left Shoulder)  Patient Location: PACU  Anesthesia Type:General and regional  Level of Consciousness: awake, alert  and oriented  Airway & Oxygen Therapy: Patient Spontanous Breathing and Patient connected to face mask oxygen  Post-op Assessment: Report given to RN and Post -op Vital signs reviewed and stable  Post vital signs: Reviewed and stable  Last Vitals:  Vitals Value Taken Time  BP    Temp    Pulse 83 05/15/19 0904  Resp 22 05/15/19 0904  SpO2 100 % 05/15/19 0904  Vitals shown include unvalidated device data.  Last Pain:  Vitals:   05/15/19 0641  TempSrc: Oral  PainSc: 8       Patients Stated Pain Goal: 5 (95/74/73 4037)  Complications: No apparent anesthesia complications

## 2019-05-16 ENCOUNTER — Encounter (HOSPITAL_BASED_OUTPATIENT_CLINIC_OR_DEPARTMENT_OTHER): Payer: Self-pay | Admitting: Orthopaedic Surgery

## 2019-05-20 ENCOUNTER — Other Ambulatory Visit: Payer: Self-pay

## 2019-05-20 ENCOUNTER — Ambulatory Visit: Payer: Medicaid Other | Attending: Orthopaedic Surgery | Admitting: Physical Therapy

## 2019-05-20 DIAGNOSIS — M6281 Muscle weakness (generalized): Secondary | ICD-10-CM | POA: Diagnosis present

## 2019-05-20 DIAGNOSIS — M25612 Stiffness of left shoulder, not elsewhere classified: Secondary | ICD-10-CM | POA: Diagnosis present

## 2019-05-20 DIAGNOSIS — R293 Abnormal posture: Secondary | ICD-10-CM | POA: Diagnosis not present

## 2019-05-20 DIAGNOSIS — M25512 Pain in left shoulder: Secondary | ICD-10-CM | POA: Diagnosis present

## 2019-05-20 NOTE — Therapy (Signed)
[place], [place], [address] Phone: [phone]   Fax:  [phone]  Physical Therapy Evaluation  Patient Details  Name: Amanda Christensen MRN: [id number] Date of Birth: May 29, 1961 Referring Provider (PT): Dr. Ophelia Charter   Encounter Date: 05/20/2019  PT End of Session - 05/20/19 1423    Visit Number  1    Number of Visits  4    Date for PT Re-Evaluation  06/03/19    Authorization Type  MCD    Authorization - Visit Number  0    Authorization - Number of Visits  3    PT Start Time  1330    PT Stop Time  1417    PT Time Calculation (min)  47 min    Activity Tolerance  Patient tolerated treatment well    Behavior During Therapy  [facility] for tasks assessed/performed       Past Medical History:  Diagnosis Date  . Arthritis   . Asthma   . Chronic pain   . COPD (chronic obstructive pulmonary disease) ([place])   . Depression    history of   . 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 GERD (gastroesophageal reflux disease)   . Heart murmur   . History of anxiety    "used to have anxiety attacks"  . History of bronchitis   . Hypertension   . Insomnia    takes Trazodone nightly  . Obesity   . Peripheral vascular disease ([facility]) 10   rt leg dvt   . Seizures ([place]) 03   was on medications none now off  since 2011  . Shortness of breath dyspnea     Past Surgical History:  Procedure Laterality Date  . BREAST EXCISIONAL BIOPSY Left 09/12/2016  . BREAST LUMPECTOMY WITH NEEDLE LOCALIZATION Left 09/13/2016   Procedure: LEFT BREAST LUMPECTOMY WITH NEEDLE LOCALIZATION;  Surgeon: Clovis Riley, MD;  Location: [facility];  Service: General;  Laterality: Left;  . CESAREAN SECTION    . COLONOSCOPY    . DILATION AND CURETTAGE OF UTERUS    . HERNIA REPAIR     umbilical  . JOINT REPLACEMENT    . miscarriage    . ROTATOR CUFF REPAIR Left   . SHOULDER ARTHROSCOPY Left 05/15/2019    Procedure: SHOULDER ARTHROSCOPY WITH MANIPULATION AND LYSIS OF ADHESIONS;  Surgeon: Hiram Gash, MD;  Location: [facility];  Service: Orthopedics;  Laterality: Left;  . SYNOVIAL BIOPSY Left 05/15/2019   Procedure: SYNOVIAL BIOPSY;  Surgeon: Hiram Gash, MD;  Location: [facility];  Service: Orthopedics;  Laterality: Left;  . TOTAL KNEE ARTHROPLASTY Right   . TOTAL KNEE ARTHROPLASTY Left 09/27/2016   Procedure: LEFT TOTAL KNEE ARTHROPLASTY;  Surgeon: [name], MD;  Location: [facility];  Service: Orthopedics;  Laterality: Left;  . TOTAL SHOULDER ARTHROPLASTY Left 01/08/2015   dr Veverly Fells  . TOTAL SHOULDER ARTHROPLASTY Left 01/08/2015   Procedure: LEFT TOTAL SHOULDER ARTHROPLASTY;  Surgeon: Augustin Schooling, MD;  Location: [facility];  Service: Orthopedics;  Laterality: Left;    There were no vitals filed for this visit.   Subjective Assessment - 05/20/19 1334    Subjective  Patient underwent L shoulder procedure for arthroscopic debridement and manipulation.  She reports never being able to have full L shoulder function after her shoulder replacement. She has difficulty with ADLs, housework and is unable to drive.  She needs A for certain activities, including opening jars, cooking, bathing and dressing. She also has a CNA who comes in for 2 hours a couple times a week and family members to help out.    Pertinent History  Bilateral TKA.  DVT, L TSA COPD, HTN    Limitations  Lifting;House hold activities;Other (comment)   takes meds for sleep for pain   How long can you walk comfortably?  limited due to back and knees    Patient Stated Goals  Patient would like to be able improve her function    Currently in Pain?  Yes    Pain Score  5     Pain Location  Shoulder    Pain Orientation  Left;Proximal    Pain Descriptors / Indicators  Heaviness    Pain Type  Surgical pain;Chronic pain    Pain Radiating Towards  fingers    Pain Onset  In the past 7 days    Pain  Frequency  Intermittent    Aggravating Factors   sleeping on it    Pain Relieving Factors  sling, medicine, uses ice a bit    Effect of Pain on Daily Activities  personal care and ADLs    Multiple Pain Sites  No         [facility] PT Assessment - 05/20/19 0001      Assessment   Medical Diagnosis  L shoulder debridement    Referring Provider (PT)  Dr. Ophelia Charter    Onset Date/Surgical Date  05/15/19    Hand Dominance  Right    Next MD Visit  05/22/19    Prior Therapy  Yes following TSA      Precautions   Precautions  None    Precaution Comments  arthroscopic protocol      Restrictions   Weight Bearing Restrictions  No      Balance Screen   Has the patient fallen in the past 6 months  No    Has the patient had a decrease in activity level because of a fear of falling?   Yes   not due to balance    Is the patient reluctant to leave their home because of a fear of falling?   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       Cognition   Overall Cognitive Status  Within Functional Limits for tasks assessed      Observation/Other Assessments   Focus on Therapeutic Outcomes (FOTO)   NT due to MCD       Sensation   Light Touch  Appears Intact      Posture/Postural Control   Posture/Postural Control  Postural limitations    Postural Limitations  Rounded Shoulders;Forward head;Increased thoracic kyphosis    Posture Comments  guards  her L UE       PROM   Overall PROM  Comments  limited by pain and fear    elbow PROM full extension LUE    Left Shoulder Flexion  90 Degrees    Left Shoulder ABduction  70 Degrees    Left Shoulder Internal Rotation  40 Degrees    Left Shoulder External Rotation  20 Degrees      Palpation   Palpation comment  sore, painful grossly to anterior and lateral aspect L shoulder         Objective measurements completed on examination: See above findings.      [facility] Adult PT Treatment/Exercise - 05/20/19 0001      Self-Care   Self-Care  RICE;Heat/Ice Application;Other Self-Care Comments    RICE  ice post session , declined today     Other Self-Care Comments   HEP, POC, MCD       Shoulder Exercises: Seated   Retraction  Strengthening;Both;5 reps      Shoulder Exercises: ROM/Strengthening   Pendulum  standing with cues for body mechanics       Shoulder Exercises: Stretch   Table Stretch - Flexion  5 reps    Table Stretch - Abduction  5 reps    Table Stretch - External Rotation  5 reps             PT Education - 05/20/19 1422    Education Details  HEP, POC, taking breaks from sling for HEP, elbow extension    Person(s) Educated  Patient    Methods  Explanation;Handout;Demonstration;Verbal cues    Comprehension  Verbalized understanding;Returned demonstration;Verbal cues required       PT Short Term Goals - 05/20/19 1424      PT SHORT TERM GOAL #1   Title  pt to be I with inital HEP for L shoulder ROM    Baseline  unknown    Time  2    Period  Weeks    Status  New    Target Date  06/06/19      PT SHORT TERM GOAL #2   Title  Pt will able to wean out of sling and demonstrate knowledge about positioning, self care, RICE    Baseline  wears continuously    Time  2    Period  Weeks    Status  New    Target Date  06/06/19      PT SHORT TERM GOAL #3   Title  Pt will be able to tolerate PROM to 100 deg in flexion and 90 deg abduction to show progression towards function    Time  2    Period  Weeks     Status  New    Target Date  06/06/19        PT Long Term Goals - 05/20/19 1449      PT LONG TERM GOAL #1   Title  TO BE DETERMINED ONCE 3 visits complete             Plan - 05/20/19 1438    Clinical Impression Statement  Patient presents for mod complexity eval of L shoulder surgery which entailed arthroscopic debridement and manipulation.  Her L shoulder surgery in 2016 resulted in a less than full functional outcome.  She hopes to improve her AROM, ability to reach, use LUE for home tasks and self care.  Due to multi joint pain and limitations she may have a slower path to recovery but she  should expect to be able to restore improved functional ROM of her non dominant arm. She has limited pain medicine and may need an alternative to get her through the more intense PT.    Personal Factors and Comorbidities  Comorbidity 1;Comorbidity 3+;Comorbidity 2;Social Background;Past/Current Experience    Comorbidities  obesity, bilateral TKA, L UE TSA, back pain, HTN, COPD    Examination-Activity Limitations  Bathing;Hygiene/Grooming;Lift;Reach Overhead;Sleep;Dressing;Carry    Examination-Participation Restrictions  Church;Community Activity;Cleaning    Stability/Clinical Decision Making  Evolving/Moderate complexity    Clinical Decision Making  Moderate    Rehab Potential  Good    PT Frequency  2x / week    PT Duration  2 weeks   then 2 x 6 more weeks if progressing   PT Treatment/Interventions  ADLs/Self Care Home Management;Electrical Stimulation;Therapeutic activities;Patient/family education;Taping;Therapeutic exercise;Cryotherapy;Moist Heat;Ultrasound;Functional mobility training;Neuromuscular re-education;Manual techniques;Passive range of motion;Vasopneumatic Device    PT Next Visit Plan  review HEP, ROM, modalities as needed    PT Home Exercise Plan  pendulums, table slides, scapular retraction    Consulted and Agree with Plan of Care  Patient       Patient will benefit from  skilled therapeutic intervention in order to improve the following deficits and impairments:  Abnormal gait, Increased fascial restricitons, Pain, Postural dysfunction, Decreased mobility, Decreased activity tolerance, Decreased range of motion, Decreased strength, Decreased endurance, Hypomobility, Impaired UE functional use, Obesity, Impaired flexibility, Difficulty walking  Visit Diagnosis: 1. Abnormal posture   2. Muscle weakness (generalized)   3. Acute pain of left shoulder   4. Stiffness of left shoulder, not elsewhere classified        Problem List Patient Active Problem List   Diagnosis Date Noted  . Total knee replacement status, left 09/27/2016  . S/P shoulder replacement 01/08/2015  . Acute renal failure ([place]) 08/02/2014  . Rhabdomyolysis 08/02/2014  . Hypotension 08/02/2014  . Lactic acidosis 08/02/2014  . Arthritis 08/02/2014  . Hyperlipidemia 08/02/2014  . Asthma 08/02/2014  . Chest pain 03/26/2014  . COPD (chronic obstructive pulmonary disease) ([place]) 03/25/2014  . Hypertension 03/25/2014  . Chest tightness or pressure 03/25/2014  . Supratherapeutic INR 03/25/2014  . Osteoarthritis 03/25/2014  . History of DVT (deep vein thrombosis) 03/25/2014    Lowanda Cashaw 05/20/2019, 2:50 PM  [facility] [address] [place], [place], [address] Phone: [phone]   Fax:  [phone]  Name: Amanda Christensen MRN: [id number] Date of Birth: 13-Feb-1961   Raeford Razor, PT 05/20/19 2:50 PM Phone: [phone] Fax: [phone]

## 2019-05-29 ENCOUNTER — Ambulatory Visit: Payer: Medicaid Other | Attending: Orthopaedic Surgery | Admitting: Physical Therapy

## 2019-05-29 ENCOUNTER — Other Ambulatory Visit: Payer: Self-pay

## 2019-05-29 DIAGNOSIS — M25512 Pain in left shoulder: Secondary | ICD-10-CM | POA: Diagnosis present

## 2019-05-29 DIAGNOSIS — M25612 Stiffness of left shoulder, not elsewhere classified: Secondary | ICD-10-CM | POA: Insufficient documentation

## 2019-05-29 DIAGNOSIS — M6281 Muscle weakness (generalized): Secondary | ICD-10-CM | POA: Insufficient documentation

## 2019-05-29 DIAGNOSIS — M62830 Muscle spasm of back: Secondary | ICD-10-CM | POA: Diagnosis present

## 2019-05-29 DIAGNOSIS — R2689 Other abnormalities of gait and mobility: Secondary | ICD-10-CM | POA: Diagnosis present

## 2019-05-29 DIAGNOSIS — R293 Abnormal posture: Secondary | ICD-10-CM | POA: Insufficient documentation

## 2019-05-29 NOTE — Therapy (Signed)
[place], [place], [address] Phone: [phone]   Fax:  [phone]  Physical Therapy Treatment  Patient Details  Name: Amanda Christensen MRN: [id number] Date of Birth: 10-24-1961 Referring Provider (PT): Dr. Ophelia Charter   Encounter Date: 05/29/2019  PT End of Session - 05/29/19 1710    Visit Number  2    Number of Visits  4    Date for PT Re-Evaluation  06/03/19    Authorization Type  MCD    Authorization - Visit Number  0    Authorization - Number of Visits  3    Activity Tolerance  Patient tolerated treatment well    Behavior During Therapy  [facility] for tasks assessed/performed       Past Medical History:  Diagnosis Date  . Arthritis   . Asthma   . Chronic pain   . COPD (chronic obstructive pulmonary disease) ([place])   . Depression    history of   . 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 GERD (gastroesophageal reflux disease)   . Heart murmur   . History of anxiety    "used to have anxiety attacks"  . History of bronchitis   . Hypertension   . Insomnia    takes Trazodone nightly  . Obesity   . Peripheral vascular disease (HCC) 10   rt leg dvt   . Seizures ([place]) 03   was on medications none now off  since 2011  . Shortness of breath dyspnea     Past Surgical History:  Procedure Laterality Date  . BREAST EXCISIONAL BIOPSY Left 09/12/2016  . BREAST LUMPECTOMY WITH NEEDLE LOCALIZATION Left 09/13/2016   Procedure: LEFT BREAST LUMPECTOMY WITH NEEDLE LOCALIZATION;  Surgeon: Clovis Riley, MD;  Location: [place];  Service: General;  Laterality: Left;  . CESAREAN SECTION    . COLONOSCOPY    . DILATION AND CURETTAGE OF UTERUS    . HERNIA REPAIR     umbilical  . JOINT REPLACEMENT    . miscarriage    . ROTATOR CUFF REPAIR Left   . SHOULDER ARTHROSCOPY Left 05/15/2019   Procedure: SHOULDER ARTHROSCOPY WITH MANIPULATION AND LYSIS OF ADHESIONS;  Surgeon:  Hiram Gash, MD;  Location: [place];  Service: Orthopedics;  Laterality: Left;  . SYNOVIAL BIOPSY Left 05/15/2019   Procedure: SYNOVIAL BIOPSY;  Surgeon: Hiram Gash, MD;  Location: [place];  Service: Orthopedics;  Laterality: Left;  . TOTAL KNEE ARTHROPLASTY Right   . TOTAL KNEE ARTHROPLASTY Left 09/27/2016   Procedure: LEFT TOTAL KNEE ARTHROPLASTY;  Surgeon: [name], MD;  Location: [place];  Service: Orthopedics;  Laterality: Left;  . TOTAL SHOULDER ARTHROPLASTY Left 01/08/2015   dr Veverly Fells  . TOTAL SHOULDER ARTHROPLASTY Left 01/08/2015   Procedure: LEFT TOTAL SHOULDER ARTHROPLASTY;  Surgeon: Augustin Schooling, MD;  Location: [place];  Service: Orthopedics;  Laterality: Left;    There were no vitals filed for this visit.  Subjective Assessment - 05/29/19 1655    Subjective  She reports her shoulder is bothering her a lot today    Pertinent History  Bilateral TKA.  DVT, L TSA COPD, HTN    Limitations  Lifting;House hold activities;Other (comment)   takes meds for sleep for pain   How long can you walk comfortably?  limited due to back and knees    Patient Stated Goals  Patient  would like to be able improve her function    Pain Score  6     Pain Location  Shoulder    Pain Orientation  Left    Pain Descriptors / Indicators  Aching;Tightness    Pain Type  Surgical pain    Pain Onset  In the past 7 days                       [facility] Adult PT Treatment/Exercise - 05/29/19 0001      Exercises   Exercises  Shoulder      Shoulder Exercises: Supine   External Rotation  AAROM;10 reps    External Rotation Limitations  wand    Flexion  10 reps;AAROM    Flexion Limitations  wand      Shoulder Exercises: Seated   Retraction  Strengthening;20 reps    Other Seated Exercises  seated ER table slide 5 sec X 15      Shoulder Exercises: Standing   Other Standing Exercises  ball rolls on high table for strething into flexion and scaption 5  sec X 15 ea    Other Standing Exercises  pendulums with 1 lb for jt distraction 20 reps circles and A-P      Shoulder Exercises: Pulleys   Flexion  1 minute    Scaption  1 minute      Modalities   Modalities  Electrical Stimulation;Moist Heat      Moist Heat Therapy   Number Minutes Moist Heat  10 Minutes    Moist Heat Location  Shoulder      Electrical Stimulation   Electrical Stimulation Location  Lt shoulder    Electrical Stimulation Action  IFC    Electrical Stimulation Parameters  tolerance level 11 in sitting    Electrical Stimulation Goals  Pain      Manual Therapy   Manual therapy comments  gentle PROM and Emily mobs             PT Education - 05/29/19 1709    Education Details  HEP review    Person(s) Educated  Patient    Methods  Explanation;Demonstration;Verbal cues    Comprehension  Verbalized understanding;Returned demonstration       PT Short Term Goals - 05/20/19 1424      PT SHORT TERM GOAL #1   Title  pt to be I with inital HEP for L shoulder ROM    Baseline  unknown    Time  2    Period  Weeks    Status  New    Target Date  06/06/19      PT SHORT TERM GOAL #2   Title  Pt will able to wean out of sling and demonstrate knowledge about positioning, self care, RICE    Baseline  wears continuously    Time  2    Period  Weeks    Status  New    Target Date  06/06/19      PT SHORT TERM GOAL #3   Title  Pt will be able to tolerate PROM to 100 deg in flexion and 90 deg abduction to show progression towards function    Time  2    Period  Weeks    Status  New    Target Date  06/06/19        PT Long Term Goals - 05/20/19 1449      PT LONG TERM GOAL #1   Title  TO BE DETERMINED ONCE 3 visits complete            Plan - 05/29/19 1710    Clinical Impression Statement  Session focused on ROM and gentle strengthening to tolerance. She recieved MHP and TENS post tx to calm down pain and relays this helped. Continue to progress as able.     Personal Factors and Comorbidities  Comorbidity 1;Comorbidity 3+;Comorbidity 2;Social Background;Past/Current Experience    Comorbidities  obesity, bilateral TKA, L UE TSA, back pain, HTN, COPD    Examination-Activity Limitations  Bathing;Hygiene/Grooming;Lift;Reach Overhead;Sleep;Dressing;Carry    Examination-Participation Restrictions  Church;Community Activity;Cleaning    Stability/Clinical Decision Making  Evolving/Moderate complexity    Rehab Potential  Good    PT Frequency  2x / week    PT Duration  2 weeks   then 2 x 6 more weeks if progressing   PT Treatment/Interventions  ADLs/Self Care Home Management;Electrical Stimulation;Therapeutic activities;Patient/family education;Taping;Therapeutic exercise;Cryotherapy;Moist Heat;Ultrasound;Functional mobility training;Neuromuscular re-education;Manual techniques;Passive range of motion;Vasopneumatic Device    PT Next Visit Plan  review HEP, ROM, modalities as needed    PT Home Exercise Plan  pendulums, table slides, scapular retraction    Consulted and Agree with Plan of Care  Patient       Patient will benefit from skilled therapeutic intervention in order to improve the following deficits and impairments:  Abnormal gait, Increased fascial restricitons, Pain, Postural dysfunction, Decreased mobility, Decreased activity tolerance, Decreased range of motion, Decreased strength, Decreased endurance, Hypomobility, Impaired UE functional use, Obesity, Impaired flexibility, Difficulty walking  Visit Diagnosis: 1. Abnormal posture   2. Muscle weakness (generalized)   3. Acute pain of left shoulder   4. Stiffness of left shoulder, not elsewhere classified        Problem List Patient Active Problem List   Diagnosis Date Noted  . Total knee replacement status, left 09/27/2016  . S/P shoulder replacement 01/08/2015  . Acute renal failure ([place]) 08/02/2014  . Rhabdomyolysis 08/02/2014  . Hypotension 08/02/2014  . Lactic acidosis 08/02/2014   . Arthritis 08/02/2014  . Hyperlipidemia 08/02/2014  . Asthma 08/02/2014  . Chest pain 03/26/2014  . COPD (chronic obstructive pulmonary disease) ([place]) 03/25/2014  . Hypertension 03/25/2014  . Chest tightness or pressure 03/25/2014  . Supratherapeutic INR 03/25/2014  . Osteoarthritis 03/25/2014  . History of DVT (deep vein thrombosis) 03/25/2014    Silvestre Mesi 05/29/2019, 5:25 PM  [facility] [address] [place], [place], [address] Phone: [phone]   Fax:  [phone]  Name: SHELANDA DUVALL MRN: [id number] Date of Birth: [birth year]

## 2019-05-30 ENCOUNTER — Ambulatory Visit: Payer: Medicaid Other | Admitting: Physical Therapy

## 2019-05-30 ENCOUNTER — Encounter: Payer: Self-pay | Admitting: Physical Therapy

## 2019-05-30 DIAGNOSIS — M25612 Stiffness of left shoulder, not elsewhere classified: Secondary | ICD-10-CM

## 2019-05-30 DIAGNOSIS — M6281 Muscle weakness (generalized): Secondary | ICD-10-CM

## 2019-05-30 DIAGNOSIS — R293 Abnormal posture: Secondary | ICD-10-CM

## 2019-05-30 DIAGNOSIS — M25512 Pain in left shoulder: Secondary | ICD-10-CM

## 2019-05-30 DIAGNOSIS — R2689 Other abnormalities of gait and mobility: Secondary | ICD-10-CM

## 2019-05-30 DIAGNOSIS — M62830 Muscle spasm of back: Secondary | ICD-10-CM

## 2019-05-30 NOTE — Therapy (Signed)
[place], [place], [address] Phone: [phone]   Fax:  [phone]  Physical Therapy Treatment  Patient Details  Name: Amanda Christensen MRN: [id number] Date of Birth: 05/16/1961 Referring Provider (PT): Dr. Ophelia Charter   Encounter Date: 05/30/2019  PT End of Session - 05/30/19 0934    Visit Number  3    Number of Visits  4    Date for PT Re-Evaluation  06/03/19    Authorization Type  MCD    Authorization - Visit Number  2    Authorization - Number of Visits  3    PT Start Time  0930    PT Stop Time  1020    PT Time Calculation (min)  50 min       Past Medical History:  Diagnosis Date  . Arthritis   . Asthma   . Chronic pain   . COPD (chronic obstructive pulmonary disease) ([place])   . Depression    history of   . 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 GERD (gastroesophageal reflux disease)   . Heart murmur   . History of anxiety    "used to have anxiety attacks"  . History of bronchitis   . Hypertension   . Insomnia    takes Trazodone nightly  . Obesity   . Peripheral vascular disease (H[facility]) 10   rt leg dvt   . Seizures ([place]) 03   was on medications none now off  since 2011  . Shortness of breath dyspnea     Past Surgical History:  Procedure Laterality Date  . BREAST EXCISIONAL BIOPSY Left 09/12/2016  . BREAST LUMPECTOMY WITH NEEDLE LOCALIZATION Left 09/13/2016   Procedure: LEFT BREAST LUMPECTOMY WITH NEEDLE LOCALIZATION;  Surgeon: Clovis Riley, MD;  Location: [place];  Service: General;  Laterality: Left;  . CESAREAN SECTION    . COLONOSCOPY    . DILATION AND CURETTAGE OF UTERUS    . HERNIA REPAIR     umbilical  . JOINT REPLACEMENT    . miscarriage    . ROTATOR CUFF REPAIR Left   . SHOULDER ARTHROSCOPY Left 05/15/2019   Procedure: SHOULDER ARTHROSCOPY WITH MANIPULATION AND LYSIS OF ADHESIONS;  Surgeon: Hiram Gash, MD;  Location:  [place];  Service: Orthopedics;  Laterality: Left;  . SYNOVIAL BIOPSY Left 05/15/2019   Procedure: SYNOVIAL BIOPSY;  Surgeon: Hiram Gash, MD;  Location: [place];  Service: Orthopedics;  Laterality: Left;  . TOTAL KNEE ARTHROPLASTY Right   . TOTAL KNEE ARTHROPLASTY Left 09/27/2016   Procedure: LEFT TOTAL KNEE ARTHROPLASTY;  Surgeon: [name], MD;  Location: [place];  Service: Orthopedics;  Laterality: Left;  . TOTAL SHOULDER ARTHROPLASTY Left 01/08/2015   dr Veverly Fells  . TOTAL SHOULDER ARTHROPLASTY Left 01/08/2015   Procedure: LEFT TOTAL SHOULDER ARTHROPLASTY;  Surgeon: Augustin Schooling, MD;  Location: [place];  Service: Orthopedics;  Laterality: Left;    There were no vitals filed for this visit.  Subjective Assessment - 05/30/19 0931    Subjective  treatment yesterday was helpful.    Currently in Pain?  Yes    Pain Score  6     Pain Location  Shoulder    Pain Orientation  Left    Pain Descriptors / Indicators  Aching;Tightness    Pain Type  Surgical pain    Aggravating Factors   exercising  Pain Relieving Factors  sling, meds, ice                       [facility] Adult PT Treatment/Exercise - 05/30/19 0001      Shoulder Exercises: Supine   External Rotation  AAROM;10 reps    External Rotation Limitations  wand    Flexion  10 reps;AAROM    Flexion Limitations  wand pressups and pullovers       Shoulder Exercises: Seated   Retraction  Strengthening;20 reps    Retraction Limitations  also shoulder roll,       Shoulder Exercises: Pulleys   Flexion  1 minute      Shoulder Exercises: ROM/Strengthening   Other ROM/Strengthening Exercises  UE ranger from floor flexion and gentle horizontals at 85 degrees       Shoulder Exercises: Stretch   Table Stretch -Flexion Limitations  10 reps    Table Stretch - ABduction Limitations  10 reps    Table Stretch - External Rotation Limitations  10 reps       Electrical Stimulation    Electrical Stimulation Location  Lt shoulder    Electrical Stimulation Action  IFC  x 15 minu    Electrical Stimulation Parameters  31ma    Electrical Stimulation Goals  Pain      Manual Therapy   Manual therapy comments  gentle PROM and GH mobs               PT Short Term Goals - 05/20/19 1424      PT SHORT TERM GOAL #1   Title  pt to be I with inital HEP for L shoulder ROM    Baseline  unknown    Time  2    Period  Weeks    Status  New    Target Date  06/06/19      PT SHORT TERM GOAL #2   Title  Pt will able to wean out of sling and demonstrate knowledge about positioning, self care, RICE    Baseline  wears continuously    Time  2    Period  Weeks    Status  New    Target Date  06/06/19      PT SHORT TERM GOAL #3   Title  Pt will be able to tolerate PROM to 100 deg in flexion and 90 deg abduction to show progression towards function    Time  2    Period  Weeks    Status  New    Target Date  06/06/19        PT Long Term Goals - 05/20/19 1449      PT LONG TERM GOAL #1   Title  TO BE DETERMINED ONCE 3 visits complete            Plan - 05/30/19 1010    Clinical Impression Statement  Pt reports pain in posterior shoulder improved however today pain is more anterior. Continued with gently mobility with pain increased during all therex. She needs cues to avoid scapular compensations. IFC and HMP applied to left shoulder as she reports this is helpful.    PT Next Visit Plan  review HEP, ROM, modalities as needed    PT Home Exercise Plan  pendulums, table slides, scapular retraction       Patient will benefit from skilled therapeutic intervention in order to improve the following deficits and impairments:  Abnormal gait, Increased fascial restricitons, Pain, Postural  dysfunction, Decreased mobility, Decreased activity tolerance, Decreased range of motion, Decreased strength, Decreased endurance, Hypomobility, Impaired UE functional use, Obesity, Impaired  flexibility, Difficulty walking  Visit Diagnosis: 1. Abnormal posture   2. Muscle weakness (generalized)   3. Acute pain of left shoulder   4. Stiffness of left shoulder, not elsewhere classified   5. Other abnormalities of gait and mobility   6. Muscle spasm of back        Problem List Patient Active Problem List   Diagnosis Date Noted  . Total knee replacement status, left 09/27/2016  . S/P shoulder replacement 01/08/2015  . Acute renal failure ([place]) 08/02/2014  . Rhabdomyolysis 08/02/2014  . Hypotension 08/02/2014  . Lactic acidosis 08/02/2014  . Arthritis 08/02/2014  . Hyperlipidemia 08/02/2014  . Asthma 08/02/2014  . Chest pain 03/26/2014  . COPD (chronic obstructive pulmonary disease) ([place]) 03/25/2014  . Hypertension 03/25/2014  . Chest tightness or pressure 03/25/2014  . Supratherapeutic INR 03/25/2014  . Osteoarthritis 03/25/2014  . History of DVT (deep vein thrombosis) 03/25/2014    Dorene Ar, PTA 05/30/2019, 10:13 AM  [facility] [address] [place], [place], [address] Phone: [phone]   Fax:  [phone]  Name: Amanda Christensen MRN: [id number] Date of Birth: Nov 13, 1961

## 2019-06-03 ENCOUNTER — Ambulatory Visit: Payer: Medicaid Other | Admitting: Physical Therapy

## 2019-06-03 ENCOUNTER — Other Ambulatory Visit: Payer: Self-pay

## 2019-06-03 ENCOUNTER — Encounter: Payer: Self-pay | Admitting: Physical Therapy

## 2019-06-03 DIAGNOSIS — M25612 Stiffness of left shoulder, not elsewhere classified: Secondary | ICD-10-CM

## 2019-06-03 DIAGNOSIS — R293 Abnormal posture: Secondary | ICD-10-CM | POA: Diagnosis not present

## 2019-06-03 DIAGNOSIS — M25512 Pain in left shoulder: Secondary | ICD-10-CM

## 2019-06-03 DIAGNOSIS — M6281 Muscle weakness (generalized): Secondary | ICD-10-CM

## 2019-06-03 NOTE — Therapy (Signed)
[place], [place], [address] Phone: [phone]   Fax:  [phone]  Physical Therapy Treatment/Renewal  Patient Details  Name: Amanda Christensen MRN: [id number] Date of Birth: 1961-08-30 Referring Provider (PT): Dr. Ophelia Charter   Encounter Date: 06/03/2019  PT End of Session - 06/03/19 1503    Visit Number  4    Number of Visits  4    Date for PT Re-Evaluation  06/03/19    Authorization Type  MCD    Authorization - Visit Number  3    Authorization - Number of Visits  3    PT Start Time  4196    PT Stop Time  1545    PT Time Calculation (min)  47 min    Activity Tolerance  Patient tolerated treatment well    Behavior During Therapy  [facility] for tasks assessed/performed       Past Medical History:  Diagnosis Date  . Arthritis   . Asthma   . Chronic pain   . COPD (chronic obstructive pulmonary disease) ([place])   . Depression    history of   . 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 GERD (gastroesophageal reflux disease)   . Heart murmur   . History of anxiety    "used to have anxiety attacks"  . History of bronchitis   . Hypertension   . Insomnia    takes Trazodone nightly  . Obesity   . Peripheral vascular disease ([facility]) 10   rt leg dvt   . Seizures ([place]) 03   was on medications none now off  since 2011  . Shortness of breath dyspnea     Past Surgical History:  Procedure Laterality Date  . BREAST EXCISIONAL BIOPSY Left 09/12/2016  . BREAST LUMPECTOMY WITH NEEDLE LOCALIZATION Left 09/13/2016   Procedure: LEFT BREAST LUMPECTOMY WITH NEEDLE LOCALIZATION;  Surgeon: Clovis Riley, MD;  Location: [place];  Service: General;  Laterality: Left;  . CESAREAN SECTION    . COLONOSCOPY    . DILATION AND CURETTAGE OF UTERUS    . HERNIA REPAIR     umbilical  . JOINT REPLACEMENT    . miscarriage    . ROTATOR CUFF REPAIR Left   . SHOULDER ARTHROSCOPY Left  05/15/2019   Procedure: SHOULDER ARTHROSCOPY WITH MANIPULATION AND LYSIS OF ADHESIONS;  Surgeon: Hiram Gash, MD;  Location: [place];  Service: Orthopedics;  Laterality: Left;  . SYNOVIAL BIOPSY Left 05/15/2019   Procedure: SYNOVIAL BIOPSY;  Surgeon: Hiram Gash, MD;  Location: [place];  Service: Orthopedics;  Laterality: Left;  . TOTAL KNEE ARTHROPLASTY Right   . TOTAL KNEE ARTHROPLASTY Left 09/27/2016   Procedure: LEFT TOTAL KNEE ARTHROPLASTY;  Surgeon: [name], MD;  Location: [place];  Service: Orthopedics;  Laterality: Left;  . TOTAL SHOULDER ARTHROPLASTY Left 01/08/2015   dr Veverly Fells  . TOTAL SHOULDER ARTHROPLASTY Left 01/08/2015   Procedure: LEFT TOTAL SHOULDER ARTHROPLASTY;  Surgeon: Augustin Schooling, MD;  Location: [place];  Service: Orthopedics;  Laterality: Left;    There were no vitals filed for this visit.  Subjective Assessment - 06/03/19 1459    Subjective  Therapy is helping, L shoulder hurts about 4/10.  Is able ot lift her arm a bit higher.    Currently in Pain?  Yes    Pain Score  4  [facility] PT Assessment - 06/03/19 0001      Assessment   Next MD Visit  4 weeks       AROM   Left Shoulder Flexion  82 Degrees   cane to 115    Left Shoulder ABduction  55 Degrees    Left Shoulder Internal Rotation  --   FR to L hip    Left Shoulder External Rotation  --   FR to side of neck, elbow down      PROM   Left Shoulder Flexion  115 Degrees    Left Shoulder ABduction  80 Degrees          [facility] Adult PT Treatment/Exercise - 06/03/19 0001      Shoulder Exercises: Supine   External Rotation  AAROM;15 reps    External Rotation Limitations  UE ranger     Flexion  10 reps;AAROM    Flexion Limitations  wand pressups and pullovers     Other Supine Exercises  chest press and overhead x 10 to tolerance       Shoulder Exercises: Seated   Other Seated Exercises  UE ranger flexion, scaption, circles x 1 min each     Other  Seated Exercises  adduction, abd to painful       Shoulder Exercises: Standing   Other Standing Exercises  isometric extension and adduction on ball x 10       Shoulder Exercises: Pulleys   Flexion  2 minutes    Scaption  2 minutes      Shoulder Exercises: ROM/Strengthening   Ball on Wall  towel slides x 10     Other ROM/Strengthening Exercises  standing table slides LUE cross body reaching     Other ROM/Strengthening Exercises  pendulum x 2 min       Electrical Stimulation   Electrical Stimulation Location  Lt shoulder    Electrical Stimulation Action  IFC x 5 min     Electrical Stimulation Parameters  to tol    Electrical Stimulation Goals  Pain               PT Short Term Goals - 06/03/19 1459      PT SHORT TERM GOAL #1   Title  pt to be I with inital HEP for L shoulder ROM    Baseline  I    Status  Achieved      PT SHORT TERM GOAL #2   Title  Pt will able to wean out of sling and demonstrate knowledge about positioning, self care, RICE    Baseline  has not worn at all    Status  Achieved      PT SHORT TERM GOAL #3   Title  Pt will be able to tolerate PROM to 100 deg in flexion and 90 deg abduction to show progression towards function    Status  Achieved        PT Long Term Goals - 06/03/19 1508      PT LONG TERM GOAL #1   Title  Pt will be able to show Independence with HEP upon discharge    Time  6    Period  Weeks    Status  New    Target Date  07/15/19      PT LONG TERM GOAL #2   Title  Pt will be able to use L UE in kitchen to reach to 100  deg for home tasks    Time  6  Period  Weeks    Status  New    Target Date  07/15/19      PT LONG TERM GOAL #3   Title  Pt will be able to use LUE for ADLs including hair, grooming with pain controlled <4/10    Time  6    Period  Weeks    Status  New    Target Date  07/15/19      PT LONG TERM GOAL #4   Title  Pt will be able to sleep in her bed with min disturbance from pain    Baseline  sleeps in  recliner    Time  6    Period  Weeks    Status  New    Target Date  07/15/19            Plan - 06/03/19 1552    Clinical Impression Statement  Patient has shown progress with L UE funcitonal mobility, increased P/AROM and tolerance for exercises.  All STGs have been met.  She will benefit from continued PT to maximize functional reach, use of LUE for ADLs, driving and recreation.    Personal Factors and Comorbidities  Comorbidity 1;Comorbidity 3+;Comorbidity 2;Social Background;Past/Current Experience    Comorbidities  obesity, bilateral TKA, L UE TSA, back pain, HTN, COPD    Examination-Activity Limitations  Bathing;Hygiene/Grooming;Lift;Reach Overhead;Sleep;Dressing;Carry    Examination-Participation Restrictions  Church;Community Activity;Cleaning    Stability/Clinical Decision Making  Evolving/Moderate complexity    PT Frequency  2x / week    PT Duration  6 weeks    PT Treatment/Interventions  ADLs/Self Care Home Management;Electrical Stimulation;Therapeutic activities;Patient/family education;Taping;Therapeutic exercise;Cryotherapy;Moist Heat;Ultrasound;Functional mobility training;Neuromuscular re-education;Manual techniques;Passive range of motion;Vasopneumatic Device    PT Next Visit Plan  progress AAROM as tolerated , modalities for pain    PT Home Exercise Plan  pendulums, table slides, scapular retraction, supine cane AAROM    Consulted and Agree with Plan of Care  Patient       Patient will benefit from skilled therapeutic intervention in order to improve the following deficits and impairments:  Abnormal gait, Increased fascial restricitons, Pain, Postural dysfunction, Decreased mobility, Decreased activity tolerance, Decreased range of motion, Decreased strength, Decreased endurance, Hypomobility, Impaired UE functional use, Obesity, Impaired flexibility, Difficulty walking  Visit Diagnosis: 1. Abnormal posture   2. Muscle weakness (generalized)   3. Acute pain of left  shoulder   4. Stiffness of left shoulder, not elsewhere classified        Problem List Patient Active Problem List   Diagnosis Date Noted  . Total knee replacement status, left 09/27/2016  . S/P shoulder replacement 01/08/2015  . Acute renal failure ([place]) 08/02/2014  . Rhabdomyolysis 08/02/2014  . Hypotension 08/02/2014  . Lactic acidosis 08/02/2014  . Arthritis 08/02/2014  . Hyperlipidemia 08/02/2014  . Asthma 08/02/2014  . Chest pain 03/26/2014  . COPD (chronic obstructive pulmonary disease) ([place]) 03/25/2014  . Hypertension 03/25/2014  . Chest tightness or pressure 03/25/2014  . Supratherapeutic INR 03/25/2014  . Osteoarthritis 03/25/2014  . History of DVT (deep vein thrombosis) 03/25/2014    PAA,JENNIFER 06/03/2019, 3:56 PM  [place], [place], [address] Phone: [phone]   Fax:  [phone]  Name: Amanda Christensen MRN: [id number] Date of Birth: 1961-07-08  Raeford Razor, PT 06/03/19 3:56 PM Phone: [phone] Fax: [phone]

## 2019-06-03 NOTE — Patient Instructions (Signed)
SHOULDER: External Rotation - Supine (Cane)   Hold cane with both hands. Rotate arm away from body. Keep elbow on floor and next to body. __10_ reps per set, __2-3_ sets per day, _5__ days per week Add towel to keep elbow at side.  Copyright  VHI. All rights reserved.  Cane Horizontal - Supine   With straight arms holding cane above shoulders, bring cane out to right, center, out to left, and back to above head. Repeat _1__ times. Do _2_ times per day.  Copyright  VHI. All rights reserved.  Cane Exercise: Flexion   Lie on back, holding cane above chest. Keeping arms as straight as possible, lower cane toward floor beyond head. Hold _10___ seconds. Repeat _10___ times. Do _2___ sessions per day.  http://gt2.exer.us/91   Copyright  VHI. All rights reserved.   CAN ALSO DO CHEST PRESS

## 2019-06-05 LAB — AEROBIC/ANAEROBIC CULTURE W GRAM STAIN (SURGICAL/DEEP WOUND)
Culture: NO GROWTH
Gram Stain: NONE SEEN
Gram Stain: NONE SEEN

## 2019-06-06 ENCOUNTER — Other Ambulatory Visit: Payer: Self-pay

## 2019-06-06 ENCOUNTER — Ambulatory Visit: Payer: Medicaid Other | Admitting: Physical Therapy

## 2019-06-06 DIAGNOSIS — M25612 Stiffness of left shoulder, not elsewhere classified: Secondary | ICD-10-CM

## 2019-06-06 DIAGNOSIS — M6281 Muscle weakness (generalized): Secondary | ICD-10-CM

## 2019-06-06 DIAGNOSIS — R293 Abnormal posture: Secondary | ICD-10-CM | POA: Diagnosis not present

## 2019-06-06 DIAGNOSIS — M25512 Pain in left shoulder: Secondary | ICD-10-CM

## 2019-06-06 NOTE — Therapy (Addendum)
[place], [place], [address] Phone: [phone]   Fax:  [phone]  Physical Therapy Treatment/MCD renewal and recert/DC  Patient Details  Name: Amanda Christensen MRN: [id number] Date of Birth: [birth year] Referring Provider (PT): Dr. Ophelia Charter   Encounter Date: 06/06/2019  PT End of Session - 06/06/19 1133    Visit Number  5    Number of Visits  12    Date for PT Re-Evaluation  07/11/19    Authorization Type  MCD renewal sent on 06/06/19 asking for 4 more weeks of PT 2 times per week    Authorization - Visit Number  3    Authorization - Number of Visits  3    PT Start Time  1100    PT Stop Time  1138    PT Time Calculation (min)  38 min    Activity Tolerance  Patient tolerated treatment well    Behavior During Therapy  [facility] for tasks assessed/performed       Past Medical History:  Diagnosis Date  . Arthritis   . Asthma   . Chronic pain   . COPD (chronic obstructive pulmonary disease) ([place])   . Depression    history of   . 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 GERD (gastroesophageal reflux disease)   . Heart murmur   . History of anxiety    "used to have anxiety attacks"  . History of bronchitis   . Hypertension   . Insomnia    takes Trazodone nightly  . Obesity   . Peripheral vascular disease ([facility]) 10   rt leg dvt   . Seizures ([place]) 03   was on medications none now off  since 2011  . Shortness of breath dyspnea     Past Surgical History:  Procedure Laterality Date  . BREAST EXCISIONAL BIOPSY Left 09/12/2016  . BREAST LUMPECTOMY WITH NEEDLE LOCALIZATION Left 09/13/2016   Procedure: LEFT BREAST LUMPECTOMY WITH NEEDLE LOCALIZATION;  Surgeon: Clovis Riley, MD;  Location: [place];  Service: General;  Laterality: Left;  . CESAREAN SECTION    . COLONOSCOPY    . DILATION AND CURETTAGE OF UTERUS    . HERNIA REPAIR     umbilical  . JOINT  REPLACEMENT    . miscarriage    . ROTATOR CUFF REPAIR Left   . SHOULDER ARTHROSCOPY Left 05/15/2019   Procedure: SHOULDER ARTHROSCOPY WITH MANIPULATION AND LYSIS OF ADHESIONS;  Surgeon: Hiram Gash, MD;  Location: [place];  Service: Orthopedics;  Laterality: Left;  . SYNOVIAL BIOPSY Left 05/15/2019   Procedure: SYNOVIAL BIOPSY;  Surgeon: Hiram Gash, MD;  Location: [place];  Service: Orthopedics;  Laterality: Left;  . TOTAL KNEE ARTHROPLASTY Right   . TOTAL KNEE ARTHROPLASTY Left 09/27/2016   Procedure: LEFT TOTAL KNEE ARTHROPLASTY;  Surgeon: [name], MD;  Location: [place];  Service: Orthopedics;  Laterality: Left;  . TOTAL SHOULDER ARTHROPLASTY Left 01/08/2015   dr Veverly Fells  . TOTAL SHOULDER ARTHROPLASTY Left 01/08/2015   Procedure: LEFT TOTAL SHOULDER ARTHROPLASTY;  Surgeon: Augustin Schooling, MD;  Location: [place];  Service: Orthopedics;  Laterality: Left;    There were no vitals filed for this visit.  Subjective Assessment - 06/06/19 1110    Subjective  Therapy is helping, L shoulder hurts a little more today, Is able ot lift her arm a bit higher.  Pertinent History  Bilateral TKA.  DVT, L TSA COPD, HTN    Limitations  Lifting;House hold activities;Other (comment)   takes meds for sleep for pain   How long can you walk comfortably?  limited due to back and knees    Patient Stated Goals  Patient would like to be able improve her function    Pain Score  7     Pain Location  Shoulder    Pain Orientation  Left    Pain Descriptors / Indicators  Aching;Tightness;Throbbing    Pain Onset  In the past 7 days         [facility] PT Assessment - 06/06/19 0001      Assessment   Medical Diagnosis  L shoulder debridement    Referring Provider (PT)  Dr. Ophelia Charter      AROM   Left Shoulder Flexion  100 Degrees    Left Shoulder ABduction  70 Degrees    Left Shoulder Internal Rotation  --   functional to L5   Left Shoulder External Rotation  --    functional to C3     PROM   Left Shoulder Flexion  123 Degrees    Left Shoulder ABduction  95 Degrees    Left Shoulder Internal Rotation  60 Degrees    Left Shoulder External Rotation  30 Degrees                   [facility] Adult PT Treatment/Exercise - 06/06/19 0001      Shoulder Exercises: Supine   Flexion  10 reps;AAROM    Flexion Limitations  wand pressups and pullovers       Shoulder Exercises: Seated   Other Seated Exercises  seated ER table AAROM leaning fwd 5 sec X 10      Shoulder Exercises: Standing   Other Standing Exercises  standing ball rolls at high table up incline wedge for AAROM flexion and abduction 5 sec X 15      Shoulder Exercises: Pulleys   Flexion  5 minutes    Flexion Limitations  with heat to her back while on pulleys    Scaption  2 minutes      Manual Therapy   Manual therapy comments  PROM all planes, and GH mobs               PT Short Term Goals - 06/06/19 1141      PT SHORT TERM GOAL #1   Title  pt to be I with inital HEP for L shoulder ROM    Baseline  I    Status  Achieved      PT SHORT TERM GOAL #2   Title  Pt will able to wean out of sling and demonstrate knowledge about positioning, self care, RICE    Baseline  has not worn at all    Status  Achieved      PT SHORT TERM GOAL #3   Title  Pt will be able to tolerate PROM to 100 deg in flexion and 90 deg abduction to show progression towards function    Baseline  123 flexion, 95 abd    Status  Achieved        PT Long Term Goals - 06/06/19 1141      PT LONG TERM GOAL #1   Title  Pt will be able to show Independence with HEP upon discharge    Baseline  indpedent with initial HEP but will need advanced HEP  Time  6    Period  Weeks    Status  On-going      PT LONG TERM GOAL #2   Title  Pt will be able to use L UE in kitchen to reach to 100  deg for home tasks    Baseline  100 deg flexion but only 70 deg abduction AROM    Time  6    Period  Weeks    Status   Partially Met      PT LONG TERM GOAL #3   Title  Pt will be able to use LUE for ADLs including hair, grooming with pain controlled <4/10    Baseline  pain around 7/10 with usual ADLs    Time  6    Period  Weeks    Status  On-going      PT LONG TERM GOAL #4   Title  Pt will be able to sleep in her bed with min disturbance from pain    Baseline  sleeps in recliner    Time  6    Period  Weeks    Status  On-going            Plan - 06/06/19 1138    Clinical Impression Statement  Patient has shown progress with L UE funcitonal mobility, increased P/AROM and tolerance for exercises. She does still lack shoulder ROM and strength limiting her function.  All STGs have been met and she shows good effort with PT.  She will benefit from continued PT to maximize functional reach, use of LUE for ADLs, driving and recreation.    Personal Factors and Comorbidities  Comorbidity 1;Comorbidity 3+;Comorbidity 2;Social Background;Past/Current Experience    Comorbidities  obesity, bilateral TKA, L UE TSA, back pain, HTN, COPD    Examination-Activity Limitations  Bathing;Hygiene/Grooming;Lift;Reach Overhead;Sleep;Dressing;Carry    Examination-Participation Restrictions  Church;Community Activity;Cleaning    Stability/Clinical Decision Making  Evolving/Moderate complexity    PT Frequency  2x / week    PT Duration  6 weeks    PT Treatment/Interventions  ADLs/Self Care Home Management;Electrical Stimulation;Therapeutic activities;Patient/family education;Taping;Therapeutic exercise;Cryotherapy;Moist Heat;Ultrasound;Functional mobility training;Neuromuscular re-education;Manual techniques;Passive range of motion;Vasopneumatic Device    PT Next Visit Plan  progress AAROM as tolerated , modalities for pain    PT Home Exercise Plan  pendulums, table slides, scapular retraction, supine cane AAROM    Consulted and Agree with Plan of Care  Patient       Patient will benefit from skilled therapeutic  intervention in order to improve the following deficits and impairments:  Abnormal gait, Increased fascial restricitons, Pain, Postural dysfunction, Decreased mobility, Decreased activity tolerance, Decreased range of motion, Decreased strength, Decreased endurance, Hypomobility, Impaired UE functional use, Obesity, Impaired flexibility, Difficulty walking  Visit Diagnosis: 1. Abnormal posture   2. Muscle weakness (generalized)   3. Acute pain of left shoulder   4. Stiffness of left shoulder, not elsewhere classified        Problem List Patient Active Problem List   Diagnosis Date Noted  . Total knee replacement status, left 09/27/2016  . S/P shoulder replacement 01/08/2015  . Acute renal failure ([place]) 08/02/2014  . Rhabdomyolysis 08/02/2014  . Hypotension 08/02/2014  . Lactic acidosis 08/02/2014  . Arthritis 08/02/2014  . Hyperlipidemia 08/02/2014  . Asthma 08/02/2014  . Chest pain 03/26/2014  . COPD (chronic obstructive pulmonary disease) ([place]) 03/25/2014  . Hypertension 03/25/2014  . Chest tightness or pressure 03/25/2014  . Supratherapeutic INR 03/25/2014  . Osteoarthritis 03/25/2014  . History of  DVT (deep vein thrombosis) 03/25/2014    Silvestre Mesi 06/06/2019, 11:43 AM  [facility] [address] [place], [place], [address] Phone: [phone]   Fax:  [phone]  Name: CLISTA RAINFORD MRN: [id number] Date of Birth: [birth year]  PHYSICAL THERAPY DISCHARGE SUMMARY  Visits from Start of Care: 5  Current functional level related to goals / functional outcomes: See above   Remaining deficits: Unknown, see above    Education / Equipment: HEP see above  Plan: Patient agrees to discharge.  Patient goals were not met. Patient is being discharged due to not returning since the last visit.  ?????    Pt had another surgery and did not return to [place], PT 09/08/19 3:26 PM Phone:  [phone] Fax: [phone]

## 2019-06-13 ENCOUNTER — Encounter

## 2019-06-14 LAB — AEROBIC/ANAEROBIC CULTURE W GRAM STAIN (SURGICAL/DEEP WOUND): Culture: NO GROWTH

## 2019-06-19 ENCOUNTER — Ambulatory Visit: Payer: Medicaid Other | Admitting: Physical Therapy

## 2019-07-14 ENCOUNTER — Encounter: Payer: Self-pay | Admitting: Internal Medicine

## 2019-07-14 ENCOUNTER — Other Ambulatory Visit: Payer: Self-pay

## 2019-07-14 ENCOUNTER — Ambulatory Visit (INDEPENDENT_AMBULATORY_CARE_PROVIDER_SITE_OTHER): Payer: Medicaid Other | Admitting: Internal Medicine

## 2019-07-14 ENCOUNTER — Ambulatory Visit: Payer: Medicaid Other | Admitting: Infectious Disease

## 2019-07-14 DIAGNOSIS — T8450XA Infection and inflammatory reaction due to unspecified internal joint prosthesis, initial encounter: Secondary | ICD-10-CM | POA: Diagnosis not present

## 2019-07-14 NOTE — Progress Notes (Signed)
[facility] for Infectious Disease      Reason for Consult: ? PJI   Referring Physician: Dr. Griffin Basil    Patient ID: Amanda Christensen, female    DOB: Sep 18, 1961, 58 y.o.   MRN: [id number]  HPI:   Here for evaluation of her left shoulder.  She has a remote history of left shoulder replacement and increased pain recently.  In June, she underwent arthroscopic surgery for capsular release and has had some improvement in ROM, though her pain is still quite considerable.  She has pain with movement of the shoulder and is still not complete.  Overall though she has improved.  She has had no fever, no notable swelling of the shoulder.  She has noted some warmth.  During the surgical procedure in June, 1 of 3 cultures sent did grow rare Propionibacterium acnes.   Previous record reviewed of culture as mentioned above and OP report reviewed.    Past Medical History:  Diagnosis Date  . Arthritis   . Asthma   . Chronic pain   . COPD (chronic obstructive pulmonary disease) ([place])   . Depression    history of   . 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 GERD (gastroesophageal reflux disease)   . Heart murmur   . History of anxiety    "used to have anxiety attacks"  . History of bronchitis   . Hypertension   . Insomnia    takes Trazodone nightly  . Obesity   . Peripheral vascular disease ([facility]) 10   rt leg dvt   . Seizures ([place]) 03   was on medications none now off  since 2011  . Shortness of breath dyspnea     Prior to Admission medications   Medication Sig Start Date End Date Taking? Authorizing Provider  albuterol (PROVENTIL HFA;VENTOLIN HFA) 108 (90 BASE) MCG/ACT inhaler Inhale 1-2 puffs into the lungs every 6 (six) hours as needed for wheezing or shortness of breath. 12/15/14  Yes Quintella Reichert, MD  Ascorbic Acid (VITAMIN C PO) Take 1 tablet by mouth at bedtime.    Yes [provider]  aspirin (ASPIRIN CHILDRENS) 81 MG chewable  tablet Chew 1 tablet (81 mg total) by mouth 2 (two) times daily. 05/15/19 05/14/20 Yes Hiram Gash, MD  celecoxib (CELEBREX) 100 MG capsule Take 1 capsule (100 mg total) by mouth 2 (two) times daily. 05/15/19 05/14/20 Yes Hiram Gash, MD  Cholecalciferol (VITAMIN D PO) Take 1 tablet by mouth at bedtime.    Yes [provider]  fluticasone (FLONASE) 50 MCG/ACT nasal spray Place 1 spray into both nostrils daily as needed for allergies or rhinitis.   Yes [provider]  Fluticasone-Salmeterol (ADVAIR) 250-50 MCG/DOSE AEPB Inhale 1 puff into the lungs 2 (two) times daily.   Yes [provider]  Glycerin-Polysorbate 80 (REFRESH DRY EYE THERAPY OP) Apply 1 drop to eye daily as needed (dry eyes).   Yes [provider]  montelukast (SINGULAIR) 10 MG tablet Take 10 mg by mouth at bedtime.   Yes [provider]  pantoprazole (PROTONIX) 40 MG tablet Take 40 mg by mouth daily.   Yes [provider]  RESTASIS MULTIDOSE 0.05 % ophthalmic emulsion Place 1 drop into both eyes daily. 08/14/16  Yes [provider]  traZODone (DESYREL) 100 MG tablet Take 100 mg by mouth at bedtime.   Yes [provider]  triamcinolone cream (KENALOG) 0.1 %  Apply 1 application topically 2 (two) times daily.   Yes [provider]  triamterene-hydrochlorothiazide (MAXZIDE-25) 37.5-25 MG tablet Take 1 tablet by mouth daily.   Yes [provider]    Allergies  Allergen Reactions  . Penicillins Other (See Comments)    syncope Has patient had a PCN reaction causing immediate rash, facial/tongue/throat swelling, SOB or lightheadedness with hypotension: Yes Has patient had a PCN reaction causing severe rash involving mucus membranes or skin necrosis: No Has patient had a PCN reaction that required hospitalization No Has patient had a PCN reaction occurring within the last 10 years: No If all of the above answers are "NO", then may proceed with  Cephalosporin use.   . Tomato Hives    From acid in food    Social History   Tobacco Use  . Smoking status: Former Smoker    Packs/day: 0.50    Years: 17.00    Pack years: 8.50    Types: Cigarettes    Quit date: 11/20/2012    Years since quitting: 6.6  . Smokeless tobacco: Former Network engineer Use Topics  . Alcohol use: No  . Drug use: No    Family History  Problem Relation Age of Onset  . Cancer Father   . Breast cancer Sister   . Hypertension Other        family history  . Cancer Other        family history  . Drug abuse Other        family history  . Other Other        family history of psychiatric problems, disabilities     Review of Systems  Constitutional: negative for fevers, chills and sweats Gastrointestinal: negative for nausea and diarrhea Integument/breast: negative for rash All other systems reviewed and are negative    Constitutional: in no apparent distress  Vitals:   07/14/19 1439  BP: 136/85  Pulse: 66   EYES: anicteric Cardiovascular: Cor RRR Respiratory: CTA B; normal respiratory effort Musculoskeletal: shoulder with decreased ROM, pain with movement, no warmth, no swelling.  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 She has significant pain for a prolonged course despite the previous release though I do not have any objective evidence of infection otherwise.  Her exam is relatively benign for infection and  her ESR and CRP are unremarkable.  Regardless, with the culture growth which is typical for shoulder infections, her considerable pain, I favor a consideration of explantation and treatment of the joint as a PJI which I believe she is in favor of due to the pain.   Plan: 1) consider reverse total shoulder arthroplasty 2) hold on antibiotics until surgery

## 2019-07-15 ENCOUNTER — Encounter: Payer: Self-pay | Admitting: Internal Medicine

## 2019-07-15 ENCOUNTER — Other Ambulatory Visit: Payer: Self-pay | Admitting: Orthopaedic Surgery

## 2019-07-15 DIAGNOSIS — M25512 Pain in left shoulder: Secondary | ICD-10-CM

## 2019-07-15 LAB — CBC WITH DIFFERENTIAL/PLATELET
Absolute Monocytes: 866 cells/uL (ref 200–950)
Basophils Absolute: 43 cells/uL (ref 0–200)
Basophils Relative: 0.6 %
Eosinophils Absolute: 7 cells/uL — ABNORMAL LOW (ref 15–500)
Eosinophils Relative: 0.1 %
HCT: 42.5 % (ref 35.0–45.0)
Hemoglobin: 14.1 g/dL (ref 11.7–15.5)
Lymphs Abs: 2087 cells/uL (ref 850–3900)
MCH: 28.1 pg (ref 27.0–33.0)
MCHC: 33.2 g/dL (ref 32.0–36.0)
MCV: 84.8 fL (ref 80.0–100.0)
MPV: 11.4 fL (ref 7.5–12.5)
Monocytes Relative: 12.2 %
Neutro Abs: 4097 cells/uL (ref 1500–7800)
Neutrophils Relative %: 57.7 %
Platelets: 226 10*3/uL (ref 140–400)
RBC: 5.01 10*6/uL (ref 3.80–5.10)
RDW: 12.7 % (ref 11.0–15.0)
Total Lymphocyte: 29.4 %
WBC: 7.1 10*3/uL (ref 3.8–10.8)

## 2019-07-15 LAB — BASIC METABOLIC PANEL
BUN: 17 mg/dL (ref 7–25)
CO2: 27 mmol/L (ref 20–32)
Calcium: 9.9 mg/dL (ref 8.6–10.4)
Chloride: 104 mmol/L (ref 98–110)
Creat: 1.01 mg/dL (ref 0.50–1.05)
Glucose, Bld: 104 mg/dL — ABNORMAL HIGH (ref 65–99)
Potassium: 3.9 mmol/L (ref 3.5–5.3)
Sodium: 141 mmol/L (ref 135–146)

## 2019-07-15 LAB — SEDIMENTATION RATE: Sed Rate: 17 mm/h (ref 0–30)

## 2019-07-15 LAB — C-REACTIVE PROTEIN: CRP: 1.4 mg/L (ref <8.0)

## 2019-07-16 ENCOUNTER — Other Ambulatory Visit: Payer: Medicaid Other

## 2019-07-31 ENCOUNTER — Ambulatory Visit
Admission: RE | Admit: 2019-07-31 | Discharge: 2019-07-31 | Disposition: A | Discharge status: Home / Self Care | Payer: Medicaid Other | Source: Ambulatory Visit | Attending: Orthopaedic Surgery | Admitting: Orthopaedic Surgery

## 2019-07-31 DIAGNOSIS — M25512 Pain in left shoulder: Secondary | ICD-10-CM

## 2019-08-15 ENCOUNTER — Encounter (HOSPITAL_COMMUNITY): Payer: Self-pay

## 2019-08-15 NOTE — Patient Instructions (Addendum)
DUE TO COVID-19 ONLY ONE VISITOR IS ALLOWED TO COME WITH YOU AND STAY IN THE WAITING ROOM ONLY DURING PRE OP AND PROCEDURE. THE ONE VISITOR MAY VISIT WITH YOU IN YOUR PRIVATE ROOM DURING VISITING HOURS ONLY!!   COVID SWAB TESTING MUST BE COMPLETED ON:  Saturday, Oct. 3, 2020 at 12 Noon.  [address], [place] -[facility] enter pre surgical testing line (Must self quarantine after testing. Follow instructions on handout.)             Your procedure is scheduled on: Wednesday, Oct. 7, 2020   Report to [facility] Main  Entrance   Report to Short Stay at 6:00 AM   Call this number if you have problems the morning of surgery [phone]   Do not eat food:After Midnight.   May have liquids until 5:30 AM day of surgery   CLEAR LIQUID DIET  Foods Allowed                                                                     Foods Excluded  Water, Black Coffee and tea, regular and decaf                             liquids that you cannot  Plain Jell-O in any flavor  (No red)                                           see through such as: Fruit ices (not with fruit pulp)                                     milk, soups, orange juice  Iced Popsicles (No red)                                    All solid food Carbonated beverages, regular and diet                                    Apple juices Sports drinks like Gatorade (No red) Lightly seasoned clear broth or consume(fat free) Sugar, honey syrup  Sample Menu Breakfast                                Lunch                                     Supper Cranberry juice                    Beef broth                            Chicken broth Jell-O  Grape juice                           Apple juice Coffee or tea                        Jell-O                                      Popsicle                                                Coffee or tea                        Coffee or  tea   Complete one Ensure drink the morning of surgery at  5:30AM the day of surgery.   Brush your teeth the morning of surgery.   Do NOT smoke after Midnight   Take these medicines the morning of surgery with A SIP OF WATER: None   Bring Asthma Inhaler day of surgery                                You may not have any metal on your body including hair pins, jewelry, and body piercings             Do not wear make-up, lotions, powders, perfumes/cologne, or deodorant             Do not wear nail polish.  Do not shave  48 hours prior to surgery.               Do not bring valuables to the hospital. [place].   Contacts, dentures or bridgework may not be worn into surgery.   Bring small overnight bag day of surgery.    Special Instructions: Bring a copy of your healthcare power of attorney and living will documents         the day of surgery if you haven't scanned them in before.              Please read over the following fact sheets you were given:  [facility] - Preparing for Surgery Before surgery, you can play an important role.  Because skin is not sterile, your skin needs to be as free of germs as possible.  You can reduce the number of germs on your skin by washing with CHG (chlorahexidine gluconate) soap before surgery.  CHG is an antiseptic cleaner which kills germs and bonds with the skin to continue killing germs even after washing. Please DO NOT use if you have an allergy to CHG or antibacterial soaps.  If your skin becomes reddened/irritated stop using the CHG and inform your nurse when you arrive at Short Stay. Do not shave (including legs and underarms) for at least 48 hours prior to the first CHG shower.  You may shave your face/neck.  Please follow these instructions carefully:  1.  Shower with CHG Soap the night before surgery and the  morning of surgery.  2.  If you choose to wash your hair, wash your hair first as  usual with your normal  shampoo.  3.  After you shampoo, rinse your hair and body thoroughly to remove the shampoo.                             4.  Use CHG as you would any other liquid soap.  You can apply chg directly to the skin and wash.  Gently with a scrungie or clean washcloth.  5.  Apply the CHG Soap to your body ONLY FROM THE NECK DOWN.   Do   not use on face/ open                           Wound or open sores. Avoid contact with eyes, ears mouth and   genitals (private parts).                       Wash face,  Genitals (private parts) with your normal soap.             6.  Wash thoroughly, paying special attention to the area where your    surgery  will be performed.  7.  Thoroughly rinse your body with warm water from the neck down.  8.  DO NOT shower/wash with your normal soap after using and rinsing off the CHG Soap.                9.  Pat yourself dry with a clean towel.            10.  Wear clean pajamas.            11.  Place clean sheets on your bed the night of your first shower and do not  sleep with pets. Day of Surgery : Do not apply any lotions/deodorants the morning of surgery.  Please wear clean clothes to the hospital/surgery center.  FAILURE TO FOLLOW THESE INSTRUCTIONS MAY RESULT IN THE CANCELLATION OF YOUR SURGERY  PATIENT SIGNATURE_________________________________  NURSE SIGNATURE__________________________________  ________________________________________________________________________   Amanda Christensen  An incentive spirometer is a tool that can help keep your lungs clear and active. This tool measures how well you are filling your lungs with each breath. Taking long deep breaths may help reverse or decrease the chance of developing breathing (pulmonary) problems (especially infection) following:  A long period of time when you are unable to move or be active. BEFORE THE PROCEDURE   If the spirometer includes an indicator to show your best effort,  your nurse or respiratory therapist will set it to a desired goal.  If possible, sit up straight or lean slightly forward. Try not to slouch.  Hold the incentive spirometer in an upright position. INSTRUCTIONS FOR USE  1. Sit on the edge of your bed if possible, or sit up as far as you can in bed or on a chair. 2. Hold the incentive spirometer in an upright position. 3. Breathe out normally. 4. Place the mouthpiece in your mouth and seal your lips tightly around it. 5. Breathe in slowly and as deeply as possible, raising the piston or the ball toward the top of the column. 6. Hold your breath for 3-5 seconds or for as long as possible. Allow the piston or ball to fall to the bottom of the column. 7. Remove the mouthpiece from  your mouth and breathe out normally. 8. Rest for a few seconds and repeat Steps 1 through 7 at least 10 times every 1-2 hours when you are awake. Take your time and take a few normal breaths between deep breaths. 9. The spirometer may include an indicator to show your best effort. Use the indicator as a goal to work toward during each repetition. 10. After each set of 10 deep breaths, practice coughing to be sure your lungs are clear. If you have an incision (the cut made at the time of surgery), support your incision when coughing by placing a pillow or rolled up towels firmly against it. Once you are able to get out of bed, walk around indoors and cough well. You may stop using the incentive spirometer when instructed by your caregiver.  RISKS AND COMPLICATIONS  Take your time so you do not get dizzy or light-headed.  If you are in pain, you may need to take or ask for pain medication before doing incentive spirometry. It is harder to take a deep breath if you are having pain. AFTER USE  Rest and breathe slowly and easily.  It can be helpful to keep track of a log of your progress. Your caregiver can provide you with a simple table to help with this. If you are  using the spirometer at home, follow these instructions: [place] IF:   You are having difficultly using the spirometer.  You have trouble using the spirometer as often as instructed.  Your pain medication is not giving enough relief while using the spirometer.  You develop fever of 100.5 F (38.1 C) or higher. SEEK IMMEDIATE MEDICAL CARE IF:   You cough up bloody sputum that had not been present before.  You develop fever of 102 F (38.9 C) or greater.  You develop worsening pain at or near the incision site. MAKE SURE YOU:   Understand these instructions.  Will watch your condition.  Will get help right away if you are not doing well or get worse. Document Released: 03/19/2007 Document Revised: 01/29/2012 Document Reviewed: 05/20/2007 [facility] Patient Information 2014 [place], [place].   ________________________________________________________________________

## 2019-08-19 ENCOUNTER — Encounter (HOSPITAL_COMMUNITY): Payer: Self-pay

## 2019-08-19 ENCOUNTER — Other Ambulatory Visit: Payer: Self-pay

## 2019-08-19 ENCOUNTER — Encounter (HOSPITAL_COMMUNITY)
Admission: RE | Admit: 2019-08-19 | Discharge: 2019-08-19 | Disposition: A | Discharge status: Home / Self Care | Payer: Medicaid Other | Source: Ambulatory Visit | Attending: Orthopaedic Surgery | Admitting: Orthopaedic Surgery

## 2019-08-19 DIAGNOSIS — M25512 Pain in left shoulder: Secondary | ICD-10-CM | POA: Diagnosis not present

## 2019-08-19 DIAGNOSIS — Z01812 Encounter for preprocedural laboratory examination: Secondary | ICD-10-CM | POA: Insufficient documentation

## 2019-08-19 HISTORY — DX: Pure hypercholesterolemia, unspecified: E78.00

## 2019-08-19 LAB — SURGICAL PCR SCREEN
MRSA, PCR: NEGATIVE
Staphylococcus aureus: NEGATIVE

## 2019-08-19 LAB — CBC
HCT: 41.8 % (ref 36.0–46.0)
Hemoglobin: 12.9 g/dL (ref 12.0–15.0)
MCH: 28.2 pg (ref 26.0–34.0)
MCHC: 30.9 g/dL (ref 30.0–36.0)
MCV: 91.3 fL (ref 80.0–100.0)
Platelets: 228 10*3/uL (ref 150–400)
RBC: 4.58 MIL/uL (ref 3.87–5.11)
RDW: 14.1 % (ref 11.5–15.5)
WBC: 5.1 10*3/uL (ref 4.0–10.5)
nRBC: 0 % (ref 0.0–0.2)

## 2019-08-19 LAB — BASIC METABOLIC PANEL
Anion gap: 10 (ref 5–15)
BUN: 12 mg/dL (ref 6–20)
CO2: 24 mmol/L (ref 22–32)
Calcium: 9.4 mg/dL (ref 8.9–10.3)
Chloride: 105 mmol/L (ref 98–111)
Creatinine, Ser: 0.9 mg/dL (ref 0.44–1.00)
GFR calc Af Amer: >60 mL/min (ref >60)
GFR calc non Af Amer: >60 mL/min (ref >60)
Glucose, Bld: 105 mg/dL — ABNORMAL HIGH (ref 70–99)
Potassium: 4.1 mmol/L (ref 3.5–5.1)
Sodium: 139 mmol/L (ref 135–145)

## 2019-08-19 NOTE — Progress Notes (Signed)
SPOKE W/  Amanda Christensen     SCREENING SYMPTOMS OF COVID 19:   COUGH--NO  RUNNY NOSE--- NO  SORE THROAT---NO  NASAL CONGESTION----NO  SNEEZING----NO  SHORTNESS OF BREATH---NO  DIFFICULTY BREATHING---NO  TEMP >100.0 -----NO  UNEXPLAINED BODY ACHES------NO  CHILLS -------- NO  HEADACHES ---------NO  LOSS OF SMELL/ TASTE --------NO    HAVE YOU OR ANY FAMILY MEMBER TRAVELLED PAST 14 DAYS OUT OF THE   COUNTY---NO STATE----NO COUNTRY----NO  HAVE YOU OR ANY FAMILY MEMBER BEEN EXPOSED TO ANYONE WITH COVID 19? NO

## 2019-08-19 NOTE — Progress Notes (Signed)
PCP - Dr. Macky Lower office Cardiologist - N/A  Chest x-ray - 01/04/2019 in epic EKG - 01/07/2019 in epic Stress Test -  N/A ECHO - N/A  Cardiac Cath -  N/A  Sleep Study - 04/30/2019 in epic CPAP -  N/A  Fasting Blood Sugar -  N/A Checks Blood Sugar __ N/A___ times a day  Blood Thinner Instructions: N/A Aspirin Instructions: Yes Last Dose:  Stopped times 1 week unsure of exact date  Anesthesia review: CAD, COPD, HTN  Patient denies shortness of breath, fever, cough and chest pain at PAT appointment   Patient verbalized understanding of instructions that were given to them at the PAT appointment. Patient was also instructed that they will need to review over the PAT instructions again at home before surgery.

## 2019-08-23 ENCOUNTER — Other Ambulatory Visit (HOSPITAL_COMMUNITY)
Admission: RE | Admit: 2019-08-23 | Discharge: 2019-08-23 | Disposition: A | Discharge status: Home / Self Care | Payer: Medicaid Other | Source: Ambulatory Visit | Attending: Orthopaedic Surgery | Admitting: Orthopaedic Surgery

## 2019-08-23 DIAGNOSIS — Z20828 Contact with and (suspected) exposure to other viral communicable diseases: Secondary | ICD-10-CM | POA: Diagnosis not present

## 2019-08-23 DIAGNOSIS — Z01812 Encounter for preprocedural laboratory examination: Secondary | ICD-10-CM | POA: Insufficient documentation

## 2019-08-25 LAB — NOVEL CORONAVIRUS, NAA (HOSP ORDER, SEND-OUT TO REF LAB; TAT 18-24 HRS): SARS-CoV-2, NAA: NOT DETECTED

## 2019-08-27 ENCOUNTER — Inpatient Hospital Stay (HOSPITAL_COMMUNITY): Payer: Medicaid Other | Admitting: Certified Registered"

## 2019-08-27 ENCOUNTER — Inpatient Hospital Stay (HOSPITAL_COMMUNITY)
Admission: RE | Admit: 2019-08-27 | Discharge: 2019-08-28 | DRG: 483 | Disposition: A | Discharge status: Home Health | Payer: Medicaid Other | Attending: Orthopaedic Surgery | Admitting: Orthopaedic Surgery

## 2019-08-27 ENCOUNTER — Inpatient Hospital Stay (HOSPITAL_COMMUNITY): Payer: Medicaid Other

## 2019-08-27 ENCOUNTER — Other Ambulatory Visit: Payer: Self-pay

## 2019-08-27 ENCOUNTER — Encounter (HOSPITAL_COMMUNITY)
Admission: RE | Disposition: A | Discharge status: Home Health | Payer: Self-pay | Source: Home / Self Care | Attending: Orthopaedic Surgery

## 2019-08-27 ENCOUNTER — Inpatient Hospital Stay (HOSPITAL_COMMUNITY): Payer: Medicaid Other | Admitting: Physician Assistant

## 2019-08-27 ENCOUNTER — Inpatient Hospital Stay: Payer: Self-pay

## 2019-08-27 ENCOUNTER — Encounter (HOSPITAL_COMMUNITY): Payer: Self-pay

## 2019-08-27 DIAGNOSIS — K219 Gastro-esophageal reflux disease without esophagitis: Secondary | ICD-10-CM | POA: Diagnosis present

## 2019-08-27 DIAGNOSIS — Z89232 Acquired absence of left shoulder: Secondary | ICD-10-CM

## 2019-08-27 DIAGNOSIS — J449 Chronic obstructive pulmonary disease, unspecified: Secondary | ICD-10-CM | POA: Diagnosis present

## 2019-08-27 DIAGNOSIS — I10 Essential (primary) hypertension: Secondary | ICD-10-CM | POA: Diagnosis present

## 2019-08-27 DIAGNOSIS — Z6841 Body Mass Index (BMI) 40.0 and over, adult: Secondary | ICD-10-CM | POA: Diagnosis not present

## 2019-08-27 DIAGNOSIS — Z96612 Presence of left artificial shoulder joint: Secondary | ICD-10-CM | POA: Diagnosis not present

## 2019-08-27 DIAGNOSIS — Y831 Surgical operation with implant of artificial internal device as the cause of abnormal reaction of the patient, or of later complication, without mention of misadventure at the time of the procedure: Secondary | ICD-10-CM | POA: Diagnosis present

## 2019-08-27 DIAGNOSIS — G4700 Insomnia, unspecified: Secondary | ICD-10-CM | POA: Diagnosis present

## 2019-08-27 DIAGNOSIS — T8459XA Infection and inflammatory reaction due to other internal joint prosthesis, initial encounter: Secondary | ICD-10-CM

## 2019-08-27 DIAGNOSIS — Z87891 Personal history of nicotine dependence: Secondary | ICD-10-CM

## 2019-08-27 DIAGNOSIS — M00812 Arthritis due to other bacteria, left shoulder: Secondary | ICD-10-CM | POA: Diagnosis not present

## 2019-08-27 DIAGNOSIS — Z79899 Other long term (current) drug therapy: Secondary | ICD-10-CM

## 2019-08-27 DIAGNOSIS — Z803 Family history of malignant neoplasm of breast: Secondary | ICD-10-CM

## 2019-08-27 DIAGNOSIS — Z880 Allergy status to penicillin: Secondary | ICD-10-CM | POA: Diagnosis not present

## 2019-08-27 DIAGNOSIS — T8450XA Infection and inflammatory reaction due to unspecified internal joint prosthesis, initial encounter: Principal | ICD-10-CM | POA: Diagnosis present

## 2019-08-27 DIAGNOSIS — Z79891 Long term (current) use of opiate analgesic: Secondary | ICD-10-CM | POA: Diagnosis not present

## 2019-08-27 DIAGNOSIS — E7800 Pure hypercholesterolemia, unspecified: Secondary | ICD-10-CM | POA: Diagnosis present

## 2019-08-27 DIAGNOSIS — Z7982 Long term (current) use of aspirin: Secondary | ICD-10-CM | POA: Diagnosis not present

## 2019-08-27 DIAGNOSIS — Z8249 Family history of ischemic heart disease and other diseases of the circulatory system: Secondary | ICD-10-CM | POA: Diagnosis not present

## 2019-08-27 DIAGNOSIS — F329 Major depressive disorder, single episode, unspecified: Secondary | ICD-10-CM | POA: Diagnosis present

## 2019-08-27 DIAGNOSIS — Z91018 Allergy to other foods: Secondary | ICD-10-CM | POA: Diagnosis not present

## 2019-08-27 DIAGNOSIS — Z7951 Long term (current) use of inhaled steroids: Secondary | ICD-10-CM

## 2019-08-27 DIAGNOSIS — M1990 Unspecified osteoarthritis, unspecified site: Secondary | ICD-10-CM | POA: Diagnosis present

## 2019-08-27 DIAGNOSIS — B9689 Other specified bacterial agents as the cause of diseases classified elsewhere: Secondary | ICD-10-CM

## 2019-08-27 DIAGNOSIS — Z96651 Presence of right artificial knee joint: Secondary | ICD-10-CM | POA: Diagnosis present

## 2019-08-27 DIAGNOSIS — G8929 Other chronic pain: Secondary | ICD-10-CM | POA: Diagnosis present

## 2019-08-27 DIAGNOSIS — Z20828 Contact with and (suspected) exposure to other viral communicable diseases: Secondary | ICD-10-CM | POA: Diagnosis present

## 2019-08-27 DIAGNOSIS — Z419 Encounter for procedure for purposes other than remedying health state, unspecified: Secondary | ICD-10-CM

## 2019-08-27 DIAGNOSIS — E669 Obesity, unspecified: Secondary | ICD-10-CM | POA: Diagnosis present

## 2019-08-27 DIAGNOSIS — Z09 Encounter for follow-up examination after completed treatment for conditions other than malignant neoplasm: Secondary | ICD-10-CM

## 2019-08-27 HISTORY — PX: REVISION TOTAL SHOULDER TO REVERSE TOTAL SHOULDER: SHX6313

## 2019-08-27 LAB — TYPE AND SCREEN
ABO/RH(D): O POS
Antibody Screen: NEGATIVE

## 2019-08-27 SURGERY — REVISION, REVERSE TOTAL ARTHROPLASTY, SHOULDER
Anesthesia: General | Site: Shoulder | Laterality: Left

## 2019-08-27 MED ORDER — FENTANYL CITRATE (PF) 100 MCG/2ML IJ SOLN
INTRAMUSCULAR | Status: AC
  Filled 2019-08-27: qty 2

## 2019-08-27 MED ORDER — STERILE WATER FOR IRRIGATION IR SOLN
Status: DC | PRN
  Administered 2019-08-27: 2000 mL

## 2019-08-27 MED ORDER — PENICILLIN G POTASSIUM 5000000 UNITS IJ SOLR
2.0000 10*6.[IU] | INTRAVENOUS | Status: DC
  Administered 2019-08-27 – 2019-08-28 (×6): 2 10*6.[IU] via INTRAVENOUS
  Filled 2019-08-27 (×9): qty 2

## 2019-08-27 MED ORDER — METOCLOPRAMIDE HCL 5 MG/ML IJ SOLN
5.0000 mg | Freq: Three times a day (TID) | INTRAMUSCULAR | Status: DC | PRN
  Administered 2019-08-27: 5 mg via INTRAVENOUS
  Filled 2019-08-27: qty 2

## 2019-08-27 MED ORDER — ONDANSETRON HCL 4 MG/2ML IJ SOLN
INTRAMUSCULAR | Status: DC | PRN
  Administered 2019-08-27: 4 mg via INTRAVENOUS

## 2019-08-27 MED ORDER — LACTATED RINGERS IV SOLN: INTRAVENOUS | Status: DC

## 2019-08-27 MED ORDER — PHENYLEPHRINE HCL (PRESSORS) 10 MG/ML IV SOLN
INTRAVENOUS | Status: AC
  Filled 2019-08-27: qty 1

## 2019-08-27 MED ORDER — ACETAMINOPHEN 500 MG PO TABS
1000.0000 mg | ORAL_TABLET | Freq: Three times a day (TID) | ORAL | Status: DC
  Administered 2019-08-27 – 2019-08-28 (×3): 1000 mg via ORAL
  Filled 2019-08-27 (×3): qty 2

## 2019-08-27 MED ORDER — SODIUM CHLORIDE 0.9% FLUSH: 10.0000 mL | INTRAVENOUS | Status: DC | PRN

## 2019-08-27 MED ORDER — METOCLOPRAMIDE HCL 5 MG PO TABS: 5.0000 mg | ORAL_TABLET | Freq: Three times a day (TID) | ORAL | Status: DC | PRN

## 2019-08-27 MED ORDER — 0.9 % SODIUM CHLORIDE (POUR BTL) OPTIME
TOPICAL | Status: DC | PRN
  Administered 2019-08-27: 1000 mL

## 2019-08-27 MED ORDER — METOCLOPRAMIDE HCL 5 MG/ML IJ SOLN: 10.0000 mg | Freq: Once | INTRAMUSCULAR | Status: DC | PRN

## 2019-08-27 MED ORDER — MIDAZOLAM HCL 2 MG/2ML IJ SOLN
INTRAMUSCULAR | Status: DC | PRN
  Administered 2019-08-27: 2 mg via INTRAVENOUS

## 2019-08-27 MED ORDER — OXYCODONE HCL 5 MG PO TABS
ORAL_TABLET | ORAL | Status: AC
  Administered 2019-08-27: 5 mg via ORAL
  Filled 2019-08-27: qty 1

## 2019-08-27 MED ORDER — MENTHOL 3 MG MT LOZG: 1.0000 | LOZENGE | OROMUCOSAL | Status: DC | PRN

## 2019-08-27 MED ORDER — SUGAMMADEX SODIUM 200 MG/2ML IV SOLN
INTRAVENOUS | Status: DC | PRN
  Administered 2019-08-27: 200 mg via INTRAVENOUS

## 2019-08-27 MED ORDER — CHLORHEXIDINE GLUCONATE CLOTH 2 % EX PADS
6.0000 | MEDICATED_PAD | Freq: Every day | CUTANEOUS | Status: DC
  Administered 2019-08-28: 10:00:00 6 via TOPICAL

## 2019-08-27 MED ORDER — BUPIVACAINE HCL (PF) 0.5 % IJ SOLN
INTRAMUSCULAR | Status: DC | PRN
  Administered 2019-08-27: 15 mL via PERINEURAL

## 2019-08-27 MED ORDER — ONDANSETRON HCL 4 MG/2ML IJ SOLN
4.0000 mg | Freq: Four times a day (QID) | INTRAMUSCULAR | Status: DC | PRN
  Administered 2019-08-27: 20:00:00 4 mg via INTRAVENOUS
  Filled 2019-08-27: qty 2

## 2019-08-27 MED ORDER — CHLORHEXIDINE GLUCONATE 4 % EX LIQD: 60.0000 mL | Freq: Once | CUTANEOUS | Status: DC

## 2019-08-27 MED ORDER — TOBRAMYCIN SULFATE 1.2 G IJ SOLR
INTRAMUSCULAR | Status: AC
  Filled 2019-08-27: qty 1.2

## 2019-08-27 MED ORDER — MAGNESIUM CITRATE PO SOLN: 1.0000 | Freq: Once | ORAL | Status: DC | PRN

## 2019-08-27 MED ORDER — ACETAMINOPHEN 10 MG/ML IV SOLN
1000.0000 mg | Freq: Once | INTRAVENOUS | Status: AC
  Administered 2019-08-27: 13:00:00 1000 mg via INTRAVENOUS

## 2019-08-27 MED ORDER — BUPIVACAINE LIPOSOME 1.3 % IJ SUSP
INTRAMUSCULAR | Status: DC | PRN
  Administered 2019-08-27: 10 mL via PERINEURAL

## 2019-08-27 MED ORDER — ONDANSETRON HCL 4 MG PO TABS: 4.0000 mg | ORAL_TABLET | Freq: Four times a day (QID) | ORAL | Status: DC | PRN

## 2019-08-27 MED ORDER — OXYCODONE HCL 5 MG PO TABS
5.0000 mg | ORAL_TABLET | ORAL | Status: DC | PRN
  Administered 2019-08-27: 10 mg via ORAL
  Administered 2019-08-27: 13:00:00 5 mg via ORAL
  Administered 2019-08-27 – 2019-08-28 (×4): 10 mg via ORAL
  Filled 2019-08-27 (×6): qty 2

## 2019-08-27 MED ORDER — FENTANYL CITRATE (PF) 250 MCG/5ML IJ SOLN
INTRAMUSCULAR | Status: DC | PRN
  Administered 2019-08-27 (×2): 50 ug via INTRAVENOUS
  Administered 2019-08-27: 25 ug via INTRAVENOUS
  Administered 2019-08-27 (×3): 50 ug via INTRAVENOUS
  Administered 2019-08-27: 100 ug via INTRAVENOUS
  Administered 2019-08-27: 25 ug via INTRAVENOUS

## 2019-08-27 MED ORDER — EPHEDRINE SULFATE-NACL 50-0.9 MG/10ML-% IV SOSY
PREFILLED_SYRINGE | INTRAVENOUS | Status: DC | PRN
  Administered 2019-08-27: 5 mg via INTRAVENOUS

## 2019-08-27 MED ORDER — ZOLPIDEM TARTRATE 5 MG PO TABS
5.0000 mg | ORAL_TABLET | Freq: Every evening | ORAL | Status: DC | PRN
  Administered 2019-08-27: 22:00:00 5 mg via ORAL
  Filled 2019-08-27: qty 1

## 2019-08-27 MED ORDER — ROCURONIUM BROMIDE 10 MG/ML (PF) SYRINGE
PREFILLED_SYRINGE | INTRAVENOUS | Status: AC
  Filled 2019-08-27: qty 10

## 2019-08-27 MED ORDER — MEPERIDINE HCL 50 MG/ML IJ SOLN: 6.2500 mg | INTRAMUSCULAR | Status: DC | PRN

## 2019-08-27 MED ORDER — DEXAMETHASONE SODIUM PHOSPHATE 10 MG/ML IJ SOLN
INTRAMUSCULAR | Status: DC | PRN
  Administered 2019-08-27: 8 mg via INTRAVENOUS

## 2019-08-27 MED ORDER — PROPOFOL 10 MG/ML IV BOLUS
INTRAVENOUS | Status: AC
  Filled 2019-08-27: qty 20

## 2019-08-27 MED ORDER — VANCOMYCIN HCL 1000 MG IV SOLR
INTRAVENOUS | Status: AC
  Filled 2019-08-27: qty 1000

## 2019-08-27 MED ORDER — SODIUM CHLORIDE 0.9 % IV SOLN
INTRAVENOUS | Status: DC | PRN
  Administered 2019-08-27: 10:00:00 10 ug/min via INTRAVENOUS

## 2019-08-27 MED ORDER — DIPHENHYDRAMINE HCL 12.5 MG/5ML PO ELIX
12.5000 mg | ORAL_SOLUTION | ORAL | Status: DC | PRN
  Administered 2019-08-27 – 2019-08-28 (×3): 25 mg via ORAL
  Filled 2019-08-27 (×3): qty 10

## 2019-08-27 MED ORDER — VANCOMYCIN HCL 1000 MG IV SOLR
INTRAVENOUS | Status: DC | PRN
  Administered 2019-08-27: 08:00:00 1000 mg via INTRAVENOUS

## 2019-08-27 MED ORDER — HYDROMORPHONE HCL 1 MG/ML IJ SOLN
INTRAMUSCULAR | Status: AC
  Filled 2019-08-27: qty 1

## 2019-08-27 MED ORDER — EPHEDRINE 5 MG/ML INJ
INTRAVENOUS | Status: AC
  Filled 2019-08-27: qty 10

## 2019-08-27 MED ORDER — VANCOMYCIN HCL IN DEXTROSE 1-5 GM/200ML-% IV SOLN
INTRAVENOUS | Status: AC
  Filled 2019-08-27: qty 200

## 2019-08-27 MED ORDER — FENTANYL CITRATE (PF) 100 MCG/2ML IJ SOLN
INTRAMUSCULAR | Status: AC
  Administered 2019-08-27: 12:00:00 50 ug via INTRAVENOUS
  Filled 2019-08-27: qty 4

## 2019-08-27 MED ORDER — TRANEXAMIC ACID-NACL 1000-0.7 MG/100ML-% IV SOLN
1000.0000 mg | INTRAVENOUS | Status: AC
  Administered 2019-08-27: 09:00:00 1000 mg via INTRAVENOUS
  Filled 2019-08-27: qty 100

## 2019-08-27 MED ORDER — PHENOL 1.4 % MT LIQD: 1.0000 | OROMUCOSAL | Status: DC | PRN

## 2019-08-27 MED ORDER — LACTATED RINGERS IV SOLN
INTRAVENOUS | Status: DC
  Administered 2019-08-27: 08:00:00 via INTRAVENOUS

## 2019-08-27 MED ORDER — FENTANYL CITRATE (PF) 100 MCG/2ML IJ SOLN
25.0000 ug | INTRAMUSCULAR | Status: DC | PRN
  Administered 2019-08-27 (×3): 50 ug via INTRAVENOUS

## 2019-08-27 MED ORDER — DEXAMETHASONE SODIUM PHOSPHATE 10 MG/ML IJ SOLN
INTRAMUSCULAR | Status: AC
  Filled 2019-08-27: qty 1

## 2019-08-27 MED ORDER — SODIUM CHLORIDE 0.9 % IR SOLN
Status: DC | PRN
  Administered 2019-08-27 (×2): 3000 mL

## 2019-08-27 MED ORDER — DOCUSATE SODIUM 100 MG PO CAPS
100.0000 mg | ORAL_CAPSULE | Freq: Two times a day (BID) | ORAL | Status: DC
  Administered 2019-08-27 – 2019-08-28 (×2): 100 mg via ORAL
  Filled 2019-08-27 (×2): qty 1

## 2019-08-27 MED ORDER — CELECOXIB 200 MG PO CAPS
200.0000 mg | ORAL_CAPSULE | Freq: Two times a day (BID) | ORAL | Status: DC
  Administered 2019-08-27 – 2019-08-28 (×2): 200 mg via ORAL
  Filled 2019-08-27 (×2): qty 1

## 2019-08-27 MED ORDER — ONDANSETRON HCL 4 MG/2ML IJ SOLN
INTRAMUSCULAR | Status: AC
  Filled 2019-08-27: qty 2

## 2019-08-27 MED ORDER — HYDROMORPHONE HCL 1 MG/ML IJ SOLN
0.5000 mg | INTRAMUSCULAR | Status: DC | PRN
  Administered 2019-08-27: 0.5 mg via INTRAVENOUS
  Administered 2019-08-27: 18:00:00 1 mg via INTRAVENOUS
  Administered 2019-08-27: 0.5 mg via INTRAVENOUS
  Filled 2019-08-27 (×2): qty 1

## 2019-08-27 MED ORDER — POLYETHYLENE GLYCOL 3350 17 G PO PACK: 17.0000 g | PACK | Freq: Every day | ORAL | Status: DC | PRN

## 2019-08-27 MED ORDER — CEFAZOLIN SODIUM-DEXTROSE 2-4 GM/100ML-% IV SOLN
2.0000 g | INTRAVENOUS | Status: DC
  Filled 2019-08-27: qty 100

## 2019-08-27 MED ORDER — PROPOFOL 10 MG/ML IV BOLUS
INTRAVENOUS | Status: DC | PRN
  Administered 2019-08-27: 170 mg via INTRAVENOUS

## 2019-08-27 MED ORDER — TOBRAMYCIN SULFATE 1.2 G IJ SOLR
INTRAMUSCULAR | Status: DC | PRN
  Administered 2019-08-27: 1.2 g via TOPICAL

## 2019-08-27 MED ORDER — ACETAMINOPHEN 10 MG/ML IV SOLN
INTRAVENOUS | Status: AC
  Administered 2019-08-27: 13:00:00 1000 mg via INTRAVENOUS
  Filled 2019-08-27: qty 100

## 2019-08-27 MED ORDER — BISACODYL 10 MG RE SUPP: 10.0000 mg | Freq: Every day | RECTAL | Status: DC | PRN

## 2019-08-27 MED ORDER — ROCURONIUM BROMIDE 10 MG/ML (PF) SYRINGE
PREFILLED_SYRINGE | INTRAVENOUS | Status: DC | PRN
  Administered 2019-08-27: 10 mg via INTRAVENOUS
  Administered 2019-08-27: 50 mg via INTRAVENOUS
  Administered 2019-08-27 (×2): 10 mg via INTRAVENOUS

## 2019-08-27 MED ORDER — VANCOMYCIN HCL IN DEXTROSE 1-5 GM/200ML-% IV SOLN
1000.0000 mg | Freq: Two times a day (BID) | INTRAVENOUS | Status: AC
  Administered 2019-08-27: 20:00:00 1000 mg via INTRAVENOUS
  Filled 2019-08-27: qty 200

## 2019-08-27 MED ORDER — MIDAZOLAM HCL 2 MG/2ML IJ SOLN
INTRAMUSCULAR | Status: AC
  Filled 2019-08-27: qty 2

## 2019-08-27 MED ORDER — VANCOMYCIN HCL 1 G IV SOLR
INTRAVENOUS | Status: DC | PRN
  Administered 2019-08-27: 1000 mg via TOPICAL

## 2019-08-27 MED ORDER — LIDOCAINE 2% (20 MG/ML) 5 ML SYRINGE
INTRAMUSCULAR | Status: AC
  Filled 2019-08-27: qty 5

## 2019-08-27 SURGICAL SUPPLY — 84 items
AID PSTN UNV HD RSTRNT DISP (MISCELLANEOUS) ×1
APL PRP STRL LF DISP 70% ISPRP (MISCELLANEOUS) ×2
BASEPLATE GLENOSPHERE 25 STD (Miscellaneous) ×1 IMPLANT
BASEPLATE GLENOSPHERE 25MM STD (Miscellaneous) ×1 IMPLANT
BIT DRILL 3.2 PERIPHERAL SCREW (BIT) ×2 IMPLANT
BLADE EXTENDED COATED 6.5IN (ELECTRODE) IMPLANT
BLADE SAW SAG 73X25 THK (BLADE) ×2
BLADE SAW SGTL 73X25 THK (BLADE) ×1 IMPLANT
BLADE SAW SGTL 81X20 HD (BLADE) ×2 IMPLANT
BODY PROXIMAL PTC 11 132.5D (Spacer) IMPLANT
BSPLAT GLND STD 25 RVRS SHLDR (Miscellaneous) ×1 IMPLANT
CAP LOCKING COCR (Cap) ×2 IMPLANT
CHLORAPREP W/TINT 26 (MISCELLANEOUS) ×6 IMPLANT
CLOSURE STERI-STRIP 1/2X4 (GAUZE/BANDAGES/DRESSINGS) ×1
CLSR STERI-STRIP ANTIMIC 1/2X4 (GAUZE/BANDAGES/DRESSINGS) ×2 IMPLANT
CONT SPEC 4OZ CLIKSEAL STRL BL (MISCELLANEOUS) ×10 IMPLANT
COOLER ICEMAN CLASSIC (MISCELLANEOUS) IMPLANT
COVER BACK TABLE 60X90IN (DRAPES) IMPLANT
COVER SURGICAL LIGHT HANDLE (MISCELLANEOUS) ×3 IMPLANT
COVER WAND RF STERILE (DRAPES) ×3 IMPLANT
DRAPE C-ARM 42X120 X-RAY (DRAPES) ×2 IMPLANT
DRAPE INCISE IOBAN 66X45 STRL (DRAPES) ×3 IMPLANT
DRAPE ORTHO SPLIT 77X108 STRL (DRAPES) ×6
DRAPE SHEET LG 3/4 BI-LAMINATE (DRAPES) ×5 IMPLANT
DRAPE SURG ORHT 6 SPLT 77X108 (DRAPES) ×2 IMPLANT
DRSG AQUACEL AG ADV 3.5X 6 (GAUZE/BANDAGES/DRESSINGS) ×3 IMPLANT
ELECT BLADE TIP CTD 4 INCH (ELECTRODE) ×3 IMPLANT
ELECT REM PT RETURN 15FT ADLT (MISCELLANEOUS) ×3 IMPLANT
FIBER TAPE CERCLAGE SUTURE ×4 IMPLANT
GLENOSPHERE REV SHOULDER 36 (Joint) ×2 IMPLANT
GLOVE BIOGEL PI IND STRL 8 (GLOVE) ×2 IMPLANT
GLOVE BIOGEL PI INDICATOR 8 (GLOVE) ×4
GLOVE ECLIPSE 8.0 STRL XLNG CF (GLOVE) ×6 IMPLANT
GLOVE SURG ORTHO 8.0 STRL STRW (GLOVE) ×5 IMPLANT
GOWN SPEC L3 XXLG W/TWL (GOWN DISPOSABLE) ×3 IMPLANT
GOWN STRL REUS W/ TWL XL LVL3 (GOWN DISPOSABLE) ×1 IMPLANT
GOWN STRL REUS W/TWL XL LVL3 (GOWN DISPOSABLE) ×3
GUIDEWIRE GLENOID 2.5X220 (WIRE) ×2 IMPLANT
HANDPIECE INTERPULSE COAX TIP (DISPOSABLE) ×3
HEMOSTAT SURGICEL 2X14 (HEMOSTASIS) IMPLANT
IMPL REVERSE SHOULDER 0X3.5 (Shoulder) IMPLANT
IMPLANT REVERSE SHOULDER 0X3.5 (Shoulder) ×3 IMPLANT
INSERT HUMERAL 36X6MM 12.5DEG (Insert) ×2 IMPLANT
KIT BASIN OR (CUSTOM PROCEDURE TRAY) ×3 IMPLANT
KIT STABILIZATION SHOULDER (MISCELLANEOUS) ×3 IMPLANT
KIT TURNOVER KIT A (KITS) IMPLANT
MANIFOLD NEPTUNE II (INSTRUMENTS) ×3 IMPLANT
NDL HYPO 25X1 1.5 SAFETY (NEEDLE) IMPLANT
NDL MAYO CATGUT SZ4 TPR NDL (NEEDLE) IMPLANT
NEEDLE HYPO 25X1 1.5 SAFETY (NEEDLE) IMPLANT
NEEDLE MAYO CATGUT SZ4 (NEEDLE) IMPLANT
NS IRRIG 1000ML POUR BTL (IV SOLUTION) ×3 IMPLANT
PACK SHOULDER (CUSTOM PROCEDURE TRAY) ×3 IMPLANT
PAD COLD SHLDR WRAP-ON (PAD) ×2 IMPLANT
PIN GUIDE 2.5X200 (PIN) ×2 IMPLANT
PROXIMAL BODY PTC 11 132.5D (Spacer) ×3 IMPLANT
RESTRAINT HEAD UNIVERSAL NS (MISCELLANEOUS) ×3 IMPLANT
SCREW 5.0X18 (Screw) ×2 IMPLANT
SCREW 5.5X22 (Screw) ×2 IMPLANT
SCREW CENTRAL THREAD 6.5X45 (Screw) ×2 IMPLANT
SCREW PERIPHERAL 5.0X34 (Screw) ×2 IMPLANT
SCREW REV AEQUALIS 20 (Screw) ×2 IMPLANT
SCREW SHLD ASSEMBLY AEQ 20 (Spacer) ×1 IMPLANT
SET HNDPC FAN SPRY TIP SCT (DISPOSABLE) ×1 IMPLANT
SLING ULTRA III MED (ORTHOPEDIC SUPPLIES) ×3 IMPLANT
SPACER REV AEQUALIS 11 (Spacer) ×1 IMPLANT
SPONGE LAP 18X18 RF (DISPOSABLE) ×4 IMPLANT
SPONGE LAP 18X18 X RAY DECT (DISPOSABLE) IMPLANT
STEM PRTL DISTAL 11 SHOULDER (Miscellaneous) ×2 IMPLANT
SUCTION FRAZIER HANDLE 12FR (TUBING) ×2
SUCTION TUBE FRAZIER 12FR DISP (TUBING) ×1 IMPLANT
SUT ETHIBOND 2 V 37 (SUTURE) ×3 IMPLANT
SUT ETHIBOND NAB CT1 #1 30IN (SUTURE) ×3 IMPLANT
SUT FIBERTAPE CERCLAGE 2 48 (Miscellaneous) ×2 IMPLANT
SUT FIBERWIRE #5 38 CONV NDL (SUTURE) ×12
SUT MON AB 3-0 SH 27 (SUTURE) ×3
SUT MON AB 3-0 SH27 (SUTURE) ×1 IMPLANT
SUT VIC AB 0 CT1 36 (SUTURE) ×3 IMPLANT
SUTURE FIBERWR #5 38 CONV NDL (SUTURE) ×4 IMPLANT
SUTURE TAPE TIGERLINK 1.3MM BL (SUTURE) IMPLANT
SUTURETAPE TIGERLINK 1.3MM BL (SUTURE) ×6
TOWEL OR 17X26 10 PK STRL BLUE (TOWEL DISPOSABLE) ×3 IMPLANT
WATER STERILE IRR 1000ML POUR (IV SOLUTION) ×6 IMPLANT
YANKAUER SUCT BULB TIP 10FT TU (MISCELLANEOUS) ×2 IMPLANT

## 2019-08-27 NOTE — Progress Notes (Signed)
Orthopedic Tech Progress Note Patient Details:  Amanda Christensen [birth year] [id number] Pt already has a sling on.         Ladell Pier [facility] 08/27/2019, 1:52 PM

## 2019-08-27 NOTE — H&P (Signed)
PREOPERATIVE H&P  Chief Complaint: left shoulder infection and implant complications  HPI: Amanda Christensen is a 57 y.o. female who presents for preoperative history and physical with a diagnosis of left shoulder infection and implant complications. Symptoms are rated as moderate to severe, and have been worsening.  This is significantly impairing activities of daily living.  Please see my clinic note for full details on this patient's care.  She has elected for surgical management.   Past Medical History:  Diagnosis Date  . Arthritis   . Asthma   . Chronic pain   . COPD (chronic obstructive pulmonary disease) ([place])   . Depression    history of   . 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 GERD (gastroesophageal reflux disease)   . Heart murmur   . High cholesterol   . History of anxiety    "used to have anxiety attacks"  . History of bronchitis   . Hypertension   . Insomnia    takes Trazodone nightly  . Obesity   . Peripheral vascular disease ([facility]) 10   rt leg dvt   . Seizures ([place]) 03   was on medications none now off  since 2011  . Shortness of breath dyspnea    Past Surgical History:  Procedure Laterality Date  . BREAST EXCISIONAL BIOPSY Left 09/12/2016  . BREAST LUMPECTOMY WITH NEEDLE LOCALIZATION Left 09/13/2016   Procedure: LEFT BREAST LUMPECTOMY WITH NEEDLE LOCALIZATION;  Surgeon: Clovis Riley, MD;  Location: [facility];  Service: General;  Laterality: Left;  . CESAREAN SECTION    . COLONOSCOPY    . DILATION AND CURETTAGE OF UTERUS    . HERNIA REPAIR     umbilical  . JOINT REPLACEMENT    . miscarriage    . ROTATOR CUFF REPAIR Left   . SHOULDER ARTHROSCOPY Left 05/15/2019   Procedure: SHOULDER ARTHROSCOPY WITH MANIPULATION AND LYSIS OF ADHESIONS;  Surgeon: Hiram Gash, MD;  Location: [place];  Service: Orthopedics;  Laterality: Left;  . SYNOVIAL BIOPSY Left 05/15/2019   Procedure: SYNOVIAL BIOPSY;   Surgeon: Hiram Gash, MD;  Location: [place];  Service: Orthopedics;  Laterality: Left;  . TOTAL KNEE ARTHROPLASTY Right   . TOTAL KNEE ARTHROPLASTY Left 09/27/2016   Procedure: LEFT TOTAL KNEE ARTHROPLASTY;  Surgeon: [name], MD;  Location: [facility];  Service: Orthopedics;  Laterality: Left;  . TOTAL SHOULDER ARTHROPLASTY Left 01/08/2015   dr Veverly Fells  . TOTAL SHOULDER ARTHROPLASTY Left 01/08/2015   Procedure: LEFT TOTAL SHOULDER ARTHROPLASTY;  Surgeon: Augustin Schooling, MD;  Location: [facility];  Service: Orthopedics;  Laterality: Left;  . TUBAL LIGATION     Social History   Socioeconomic History  . Marital status: Divorced    Spouse name: Not on file  . Number of children: Not on file  . Years of education: Not on file  . Highest education level: Not on file  Occupational History  . Not on file  Social Needs  . Financial resource strain: Not on file  . Food insecurity    Worry: Not on file    Inability: Not on file  . Transportation needs    Medical: Not on file    Non-medical: Not on file  Tobacco Use  . Smoking status: Former Smoker    Packs/day: 0.50    Years: 17.00    Pack years: 8.50    Types: Cigarettes  Quit date: 11/20/2012    Years since quitting: 6.7  . Smokeless tobacco: Former Network engineer and Sexual Activity  . Alcohol use: No  . Drug use: Not Currently  . Sexual activity: Yes    Birth control/protection: Surgical  Lifestyle  . Physical activity    Days per week: Not on file    Minutes per session: Not on file  . Stress: Not on file  Relationships  . Social Herbalist on phone: Not on file    Gets together: Not on file    Attends religious service: Not on file    Active member of club or organization: Not on file    Attends meetings of clubs or organizations: Not on file    Relationship status: Not on file  Other Topics Concern  . Not on file  Social History Narrative  . Not on file   Family History  Problem  Relation Age of Onset  . Cancer Father   . Breast cancer Sister   . Hypertension Other        family history  . Cancer Other        family history  . Drug abuse Other        family history  . Other Other        family history of psychiatric problems, disabilities   Allergies  Allergen Reactions  . Penicillins Other (See Comments)    "Passed out" Did it involve swelling of the face/tongue/throat, SOB, or low BP? No Did it involve sudden or severe rash/hives, skin peeling, or any reaction on the inside of your mouth or nose? No Did you need to seek medical attention at a hospital or doctor's office? Yes When did it last happen?Many years ago If all above answers are "NO", may proceed with cephalosporin use.    . Tomato Hives and Rash    (too much) When eats too much acidic or spicy foods she breaks out.   Prior to Admission medications   Medication Sig Start Date End Date Taking? Authorizing Provider  albuterol (PROVENTIL HFA;VENTOLIN HFA) 108 (90 BASE) MCG/ACT inhaler Inhale 1-2 puffs into the lungs every 6 (six) hours as needed for wheezing or shortness of breath. 12/15/14  Yes Quintella Reichert, MD  Ascorbic Acid (VITAMIN C PO) Take 1 tablet by mouth at bedtime.    Yes [provider]  aspirin EC 81 MG tablet Take 81 mg by mouth at bedtime.   Yes [provider]  atorvastatin (LIPITOR) 20 MG tablet Take 20 mg by mouth at bedtime. 05/15/19  Yes [provider]  celecoxib (CELEBREX) 100 MG capsule Take 1 capsule (100 mg total) by mouth 2 (two) times daily. 05/15/19 05/14/20 Yes Hiram Gash, MD  Cholecalciferol (VITAMIN D PO) Take 1 tablet by mouth at bedtime.    Yes [provider]  fluticasone (FLONASE) 50 MCG/ACT nasal spray Place 1 spray into both nostrils daily as needed for allergies or rhinitis.   Yes [provider]  Fluticasone-Salmeterol (ADVAIR) 250-50 MCG/DOSE AEPB Inhale 1 puff into the lungs daily.    Yes [provider]  meloxicam (MOBIC) 7.5 MG tablet Take 7.5 mg by mouth daily as needed for pain. 05/15/19  Yes [provider]  montelukast (SINGULAIR) 10 MG tablet Take 10 mg by mouth at bedtime.   Yes [provider]  Omega-3 Fatty Acids (FISH OIL) 500 MG CAPS Take 500 mg by mouth at bedtime.   Yes  [provider]  pantoprazole (PROTONIX) 40 MG tablet Take 40 mg by mouth daily as needed (acid reflux).    Yes [provider]  PAZEO 0.7 % SOLN Place 1 drop into both eyes daily. 06/19/19  Yes [provider]  RESTASIS MULTIDOSE 0.05 % ophthalmic emulsion Place 1 drop into both eyes at bedtime.  08/14/16  Yes [provider]  traMADol (ULTRAM) 50 MG tablet Take 100 mg by mouth every 6 (six) hours as needed.   Yes [provider]  traZODone (DESYREL) 100 MG tablet Take 100 mg by mouth at bedtime.   Yes [provider]  triamcinolone (KENALOG) 0.025 % cream Apply 1 application topically 2 (two) times daily as needed for rash. 06/17/19  Yes [provider]  triamcinolone cream (KENALOG) 0.1 % Apply 1 application topically 2 (two) times daily.   Yes [provider]  triamterene-hydrochlorothiazide (MAXZIDE-25) 37.5-25 MG tablet Take 1 tablet by mouth daily.   Yes [provider]  albuterol (PROVENTIL) (2.5 MG/3ML) 0.083% nebulizer solution Inhale 2.5 mg into the lungs every 6 (six) hours as needed for wheezing or shortness of breath.  05/15/19   [provider]     Positive ROS: All other systems have been reviewed and were otherwise negative with the exception of those mentioned in the HPI and as above.  Physical Exam: General: Alert, no acute distress Cardiovascular: No pedal edema Respiratory: No cyanosis, no use of accessory musculature GI: No organomegaly, abdomen is soft and non-tender Skin: No lesions in the area of chief complaint Neurologic: Sensation intact distally Psychiatric: Patient  is competent for consent with normal mood and affect Lymphatic: No axillary or cervical lymphadenopathy  MUSCULOSKELETAL: L shoulder pain with ROM, + culture x1, block in place  Assessment: left shoulder infection and implant complications  Plan: Plan for Procedure(s): REVISION TOTAL SHOULDER TO REVERSE TOTAL SHOULDER  The risks benefits and alternatives were discussed with the patient including but not limited to the risks of nonoperative treatment, versus surgical intervention including infection, bleeding, nerve injury,  blood clots, cardiopulmonary complications, morbidity, mortality, among others, and they were willing to proceed.   Hiram Gash, MD  08/27/2019 8:24 AM

## 2019-08-27 NOTE — Progress Notes (Signed)
Peripherally Inserted Central Catheter/Midline Placement  The IV Nurse has discussed with the patient and/or persons authorized to consent for the patient, the purpose of this procedure and the potential benefits and risks involved with this procedure.  The benefits include less needle sticks, lab draws from the catheter, and the patient may be discharged home with the catheter. Risks include, but not limited to, infection, bleeding, blood clot (thrombus formation), and puncture of an artery; nerve damage and irregular heartbeat and possibility to perform a PICC exchange if needed/ordered by physician.  Alternatives to this procedure were also discussed.  Bard Power PICC patient education guide, fact sheet on infection prevention and patient information card has been provided to patient /or left at bedside.    PICC/Midline Placement Documentation  PICC Single Lumen 08/27/19 PICC Right Brachial 39 cm 0 cm (Active)  Indication for Insertion or Continuance of Line Home intravenous therapies (PICC only) 08/27/19 2200  Exposed Catheter (cm) 0 cm 08/27/19 2200  Site Assessment Clean;Dry;Intact 08/27/19 2200  Line Status Blood return noted;Flushed;Saline locked 08/27/19 2200  Dressing Type Transparent;Occlusive;Securing device 08/27/19 2200  Dressing Status Clean;Dry;Intact;Antimicrobial disc in place 08/27/19 2200  Line Adjustment (NICU/IV Team Only) No 08/27/19 2200  Dressing Intervention New dressing 08/27/19 2200  Dressing Change Due 09/03/19 08/27/19 2200       Edson Snowball 08/27/2019, 10:07 PM

## 2019-08-27 NOTE — Anesthesia Procedure Notes (Signed)
Procedure Name: Intubation Date/Time: 08/27/2019 8:51 AM Performed by: Eben Burow, CRNA Pre-anesthesia Checklist: Patient identified, Emergency Drugs available, Suction available, Patient being monitored and Timeout performed Patient Re-evaluated:Patient Re-evaluated prior to induction Oxygen Delivery Method: Circle system utilized Preoxygenation: Pre-oxygenation with 100% oxygen Induction Type: IV induction Ventilation: Mask ventilation without difficulty Laryngoscope Size: Mac and 4 Grade View: Grade I Tube type: Oral Tube size: 7.0 mm Number of attempts: 1 Airway Equipment and Method: Stylet Placement Confirmation: ETT inserted through vocal cords under direct vision,  positive ETCO2 and breath sounds checked- equal and bilateral Secured at: 20 cm Tube secured with: Tape Dental Injury: Teeth and Oropharynx as per pre-operative assessment

## 2019-08-27 NOTE — Op Note (Signed)
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  Patient's subscap was attenuated as well as her anterior superior cuff.  Her implants were well fixed.  No clinical sign of infection during the surgery.  We are able to remove all implants though there was a small amount of plastic from the polyethylene glenoid centrally that was unable to be removed without destroying the glenoid itself.  He had robust fixation with our glenoid baseplate and an osteotomy was used to remove the humeral stem.  This was repaired with fiber tape cerclage and there were no neurovascular structures beneath these as they were double and triple checked.  No subscap repair able to be performed.  5 culture sent for specimen.  Implants: 25 baseplate, 36 mm glenosphere, Tornier revive stem with an 11 mm distal stem 9 mm proximal body and 20 mm spacer.  Arthrex fiber tape cerclage x2  Post-operative plan: The patient will be NWB in sling.  The patient will be admitted overnight.  DVT prophylaxis not indicated in isolated upper extremity surgery patient with no specific risks factors.  Pain control with PRN pain medication preferring oral medicines.  Follow up plan will be scheduled in approximately 7 days for incision check and XR.  Physical therapy to start after first visit.  Post-Op Diagnosis: Same Surgeons:Primary: Hiram Gash, MD Assistants: Noemi Chapel, PA-C, Joya Gaskins OPAC Location: [place] [facility] Anesthesia: General with Exparel Interscalene Antibiotics: Ancef 2g preop, Vancomycin 1000mg  locally, 1.2 g tobramycin Tourniquet time: None Estimated Blood Loss: Q000111Q Complications: None Specimens: None Implants: Implant Name Type Inv. Item Serial No.  Manufacturer Lot No. LRB No. Used Action  BASEPLATE GLENOSPHERE [id number] STD - [id number] Miscellaneous BASEPLATE GLENOSPHERE [id number] STD [id number] [employer]  Left 1 Implanted  GLENOSPHERE REV SHOULDER 36 - [id number] Joint GLENOSPHERE REV SHOULDER 36 [id number] [employer]  Left 1 Implanted  SCREW CENTRAL THREAD 6.5X45 - [id number] Screw SCREW CENTRAL THREAD 6.5X45  [employer]  Left 1 Implanted  SCREW 5.0X18 - [id number] Screw SCREW 5.0X18  [employer]  Left 1 Implanted  SCREW PERIPHERAL 5.0X34 - [id number] Screw SCREW PERIPHERAL 5.0X34  [employer]  Left 1 Implanted  SCREW 5.5X22 - [id number] Screw SCREW 5.5X22  [employer]  Left 1 Implanted  STEM PRTL DISTAL 11 SHOULDER - [id number] Miscellaneous STEM PRTL DISTAL 11 SHOULDER [id number] [employer]  Left 1 Implanted  INSERT HUMERAL 36X6MM 12.5DEG - [id number] Insert INSERT HUMERAL 36X6MM 12.5DEG [id number] [employer]  Left 1 Implanted  IMPLANT REVERSE SHOULDER 0X3.5 - [id number] Shoulder IMPLANT REVERSE SHOULDER 0X3.5 [id number] [employer]  Left 1 Implanted    Indications for Surgery:   [name] is a 58 y.o. female with continued pain after left total shoulder arthroplasty with decreased function and arthroscopic cultures demonstrating infection.  Infectious disease felt that one stage arthroplasty was reasonable as to be..  Benefits and risks of operative and nonoperative management were discussed prior to surgery with patient/guardian(s) and informed consent form was completed.  Infection and need for further surgery were discussed as was prosthetic stability and cuff issues.  We additionally specifically discussed risks of axillary nerve injury, infection, periprosthetic fracture, continued pain and longevity of implants prior to beginning procedure.      Procedure:   The patient was identified  in the preoperative holding area where the surgical site was marked. Block placed by anesthesia with exparel.  The patient was taken to the OR  where a procedural timeout was called and the above noted anesthesia was induced.  The patient was positioned beachchair on allen table with spider arm positioner.  Preoperative antibiotics were dosed.  The patient's left shoulder was prepped and draped in the usual sterile fashion.  A second preoperative timeout was called.      Standard deltopectoral approach was performed with a #10 blade utilizing the previously made incision. We dissected down to the subcutaneous tissues and the cephalic vein was taken laterally with the deltoid. Clavipectoral fascia was incised in line with the incision. Deep retractors were placed.  We identified the interval that he previously been using the long head of the biceps clearly and tenotomized in the past.  We did use the bicipital groove as a landmark.    At this point we performed a peel on the remnant subscapularis tissue.  This was essentially just scar and clearly not functional.  There is no translation or movement on this tissue.  We were able to peel the subscapularis and half of the pectoralis and the tissue directly off the bone taking it as a flap staying on bone to avoid neurovascular damage.  Once we are able to peel this three quarters the way around the humerus we were able to externally rotate the humerus and dislocate the head.  We used a tuning fork type device to disimpact the head and then proceeded with attempts at using osteotomes to free the proximal aspect of the stem and remove it with a broach handle.  Unfortunately it was well fixed and we then decided to perform a extended osteotomy in the bicipital groove.  We used a 10 mm sawblade to make a longitudinal split just lateral to the bicipital groove down to the level of the pectoralis insertion.  We then made a transverse limb laterally to avoid propagation of the fracture.  At that point we used a series of osteotomes to free and plastically deformed the proximal humerus without fracturing.  We  then were able to extract the stem without issue.    We then used the Arthrex fiber tape cerclage system to place 2 cerclage sutures in typical fashion around the osteotomy site just proximal to the lateral aspect of the osteotomy and just below the tuberosities.  We took great care to visualize around the humeral stem and 360 degrees and were able to pass her fingers directly on bone before passing the passing hook directly on bone to avoid neurovascular damage to either the radial or axillary nerve.  We were clear that there was nothing underneath the sutures and we passed her cerclage twice as is typical per the technique.  We then cinched these down provisionally with the tightener to 30 pounds of pressure.  We were able to preserve most of the proximal humerus that there is some anterior fragmentation.  This point we started to broach.  We used the Tornier revive system and found that we had good purchase distal to our osteotomy with a 11 mm stem.  We sized and selected a 11-year stem with a 9 mm proximal body and a 20 mm spacer.  With good fit with this and turned our attention to the glenoid side.  There was significant scarring and capsular contracture all around the shoulder.  We released the capsule just off the glenoid and  were able to get visualization.  At that point we placed her anterior and posterior glenoid retractors.  We were able to palpate the axillary nerve before doing so and with a tug test it was intact.  Protected throughout.  We then used a saw to take out the glenoid polyethylene component in piecemeal as is typical making inverted triangle and removing each PEG individually.  The central peg had significant ingrowth and was difficult to remove.  He was removed piecemeal and there was some of the PEG that was so buried in the glenoid that we felt that it would cause damage to the glenoid to remove it in its entirety.  We irrigated the glenoid with 3 L of normal saline and removed  all cement that we could find.     The glenoid drill guide was placed and used to drill a guide pin in the center, inferior position. The glenoid face was then reamed concentrically over the guide wire. The center hole was drilled over the guidepin in a near anatomic angle of version. Next the glenoid vault was drilled back to a depth of  45 mm.  We tapped and then placed a 25 mm size baseplate with 0 lateralization was selected with a 6.5 mm x 45 mm length central screw.  The base plate was screwed into the glenoid vault obtaining secure fixation. We next placed superior and inferior locking screws for additional fixation.  We did place a posterior nonlocking screw as well. Next a 36 mm glenosphere was selected and impacted onto the baseplate. The center screw was tightened.   We trialed with multiple size tray and polyethylene options and selected a 0 high tray turned to 12:00 which provided good stability and range of motion without excess soft tissue tension. The offset was dialed in to match the normal anatomy. The shoulder was trialed.  There was good ROM in all planes and the shoulder was stable with no inferior translation.    A 36+6 polyethylene liner was impacted onto the stem.  The joint was reduced and thoroughly irrigated with 3 L pulsatile lavage and we did also irrigate into the canal prior to placing her stem.  There was no subscapularis tissue to adequately repair the superior cuff had been nutritionally loss as well.  Hemostasis was obtained. The deltopectoral interval was reapproximated with #1 Ethibond. The subcutaneous tissues were closed with 3-0 Vicryl and the skin was closed with running monocryl.     The wounds were cleaned and dried and an Aquacel dressing was placed. The drapes taken down. The arm was placed into sling with abduction pillow. Patient was awakened, extubated, and transferred to the recovery room in stable condition. There were no intraoperative complications. The  sponge, needle, and attention counts were correct at the end of the case.   Noemi Chapel, PA-C, present and scrubbed throughout the case, critical for completion in a timely fashion, and for retraction, instrumentation, closure.

## 2019-08-27 NOTE — Anesthesia Preprocedure Evaluation (Addendum)
Anesthesia Evaluation  Patient identified by MRN, date of birth, ID band Patient awake    Reviewed: Allergy & Precautions, NPO status , Patient's Chart, lab work & pertinent test results  Airway Mallampati: II  TM Distance: >3 FB Neck ROM: Full    Dental no notable dental hx.    Pulmonary asthma , COPD, former smoker,    Pulmonary exam normal breath sounds clear to auscultation       Cardiovascular hypertension, + DVT  Normal cardiovascular exam Rhythm:Regular Rate:Normal     Neuro/Psych negative neurological ROS  negative psych ROS   GI/Hepatic negative GI ROS, Neg liver ROS,   Endo/Other  Morbid obesity  Renal/GU negative Renal ROS  negative genitourinary   Musculoskeletal negative musculoskeletal ROS (+)   Abdominal   Peds negative pediatric ROS (+)  Hematology negative hematology ROS (+)   Anesthesia Other Findings   Reproductive/Obstetrics negative OB ROS                             Anesthesia Physical Anesthesia Plan  ASA: III  Anesthesia Plan: General   Post-op Pain Management:  Regional for Post-op pain   Induction: Intravenous  PONV Risk Score and Plan: 3 and Ondansetron, Dexamethasone, Midazolam and Treatment may vary due to age or medical condition  Airway Management Planned: Oral ETT  Additional Equipment:   Intra-op Plan:   Post-operative Plan: Extubation in OR  Informed Consent: I have reviewed the patients History and Physical, chart, labs and discussed the procedure including the risks, benefits and alternatives for the proposed anesthesia with the patient or authorized representative who has indicated his/her understanding and acceptance.     Dental advisory given  Plan Discussed with: CRNA  Anesthesia Plan Comments:         Anesthesia Quick Evaluation

## 2019-08-27 NOTE — Anesthesia Postprocedure Evaluation (Signed)
Anesthesia Post Note  Patient: Amanda Christensen  Procedure(s) Performed: REVISION TOTAL SHOULDER TO REVERSE TOTAL SHOULDER (Left Shoulder)     Patient location during evaluation: PACU Anesthesia Type: General Level of consciousness: awake and alert Pain management: pain level controlled Vital Signs Assessment: post-procedure vital signs reviewed and stable Respiratory status: spontaneous breathing, nonlabored ventilation, respiratory function stable and patient connected to nasal cannula oxygen Cardiovascular status: blood pressure returned to baseline and stable Postop Assessment: no apparent nausea or vomiting Anesthetic complications: no    Last Vitals:  Vitals:   08/27/19 1315 08/27/19 1328  BP: (!) 144/87 125/78  Pulse: 65 67  Resp: 18 16  Temp: 36.6 C 36.6 C  SpO2: 100% 99%    Last Pain:  Vitals:   08/27/19 1328  TempSrc: Oral  PainSc:                  Montez Hageman

## 2019-08-27 NOTE — Progress Notes (Signed)
PHARMACY CONSULT NOTE FOR:  OUTPATIENT  PARENTERAL ANTIBIOTIC THERAPY (OPAT)  Indication: Prosthetic joint infection Regimen: Penicillin G 2 million units q4h End date: 09/23/2019  IV antibiotic discharge orders are pended. To discharging provider:  please sign these orders via discharge navigator,  Select New Orders & click on the button choice - Manage This Unsigned Work.     Thank you for allowing pharmacy to be a part of this patient's care.  Minda Ditto PharmD 08/27/2019, 3:12 PM

## 2019-08-27 NOTE — Transfer of Care (Signed)
Immediate Anesthesia Transfer of Care Note  Patient: Amanda Christensen  Procedure(s) Performed: REVISION TOTAL SHOULDER TO REVERSE TOTAL SHOULDER (Left Shoulder)  Patient Location: PACU  Anesthesia Type:General  Level of Consciousness: awake, alert  and oriented  Airway & Oxygen Therapy: Patient Spontanous Breathing and Patient connected to face mask oxygen  Post-op Assessment: Report given to RN  Post vital signs: Reviewed and stable  Last Vitals:  Vitals Value Taken Time  BP 148/87 08/27/19 1215  Temp    Pulse 77 08/27/19 1216  Resp 12 08/27/19 1216  SpO2 100 % 08/27/19 1216  Vitals shown include unvalidated device data.  Last Pain:  Vitals:   08/27/19 0644  TempSrc: Oral  PainSc: 8       Patients Stated Pain Goal: 2 (99991111 [id number])  Complications: No apparent anesthesia complications

## 2019-08-27 NOTE — Anesthesia Procedure Notes (Signed)
Anesthesia Regional Block: Supraclavicular block   Pre-Anesthetic Checklist: ,, timeout performed, Correct Patient, Correct Site, Correct Laterality, Correct Procedure, Correct Position, site marked, Risks and benefits discussed,  Surgical consent,  Pre-op evaluation,  At surgeon's request and post-op pain management  Laterality: Left and Upper  Prep: Maximum Sterile Barrier Precautions used, chloraprep       Needles:  Injection technique: Single-shot  Needle Type: Echogenic Stimulator Needle     Needle Length: 10cm      Additional Needles:   Procedures:,,,, ultrasound used (permanent image in chart),,,,  Narrative:  Start time: 08/27/2019 8:00 AM End time: 08/27/2019 8:12 AM Injection made incrementally with aspirations every 5 mL.  Performed by: Personally  Anesthesiologist: Montez Hageman, MD  Additional Notes: Risks, benefits and alternative to block explained extensively.  Patient tolerated procedure well, without complications.

## 2019-08-27 NOTE — Consult Note (Addendum)
[facility]       Reason for Consult: shoulder PJI    Referring Physician: Dr. Griffin Basil  Active Problems:   S/P reverse total shoulder arthroplasty, left   . acetaminophen  1,000 mg Oral Q8H  . celecoxib  200 mg Oral BID  . docusate sodium  100 mg Oral BID  . 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 Now s/p reverse total shoulder.  She will need a prolonged treatment and will start with IV penicillin for at least 2 weeks and I will consider changing to oral doxycycline after that.  Will continue to monitor culture as an outpatient to assure nothing different grows.   OK for d/c tomorrow from ID standpoint Penicillin allergy - she recalls passing out after being given a shot of penicillin in her 20s but no sob, throat swelling, rash or other concerns.  Not c/w an allergy so will ok to use penicillin, which is optimal for this bacteria.     Antibiotics: perioperative  HPI: Amanda Christensen is a 58 y.o. female I saw in August with a history of a remote left shoulder replacement who has been followed by Dr. Griffin Basil for increased shoulder pain.  In June of this year she underwent arthroscopic surgery for capsular release but pain persisted.  In 1 of the 3 cultures of the shoulder, Cutibacterium grew with the other cultures remaining negative.  In discussion with Dr. Griffin Basil, and our mutual concern for true infection, she was brought to the OR today for reverse shoulder, which was done today.  She is having some pain.  No fever or chills.  Cultures sent.     Review of Systems:  Constitutional: negative for fevers, chills and anorexia Gastrointestinal: negative for nausea and diarrhea Integument/breast: negative for rash All other systems reviewed and are  negative    Past Medical History:  Diagnosis Date  . Arthritis   . Asthma   . Chronic pain   . COPD (chronic obstructive pulmonary disease) ([place])   . Depression    history of   . 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 GERD (gastroesophageal reflux disease)   . Heart murmur   . High cholesterol   . History of anxiety    "used to have anxiety attacks"  . History of bronchitis   . Hypertension   . Insomnia    takes Trazodone nightly  . Obesity   . Peripheral vascular disease (H[facility]) 10   rt leg dvt   . Seizures ([place]) 03   was on medications none now off  since 2011  . Shortness of breath dyspnea     Social History   Tobacco Use  . Smoking status: Former Smoker    Packs/day: 0.50    Years: 17.00    Pack years: 8.50    Types: Cigarettes    Quit date: 11/20/2012    Years since quitting: 6.7  . Smokeless tobacco: Former Network engineer Use Topics  . Alcohol use: No  . Drug use: Not Currently    Family History  Problem Relation Age of Onset  . Cancer Father   . Breast cancer Sister   . Hypertension Other  family history  . Cancer Other        family history  . Drug abuse Other        family history  . Other Other        family history of psychiatric problems, disabilities    Allergies  Allergen Reactions  . Penicillins Other (See Comments)    "Passed out" Did it involve swelling of the face/tongue/throat, SOB, or low BP? No Did it involve sudden or severe rash/hives, skin peeling, or any reaction on the inside of your mouth or nose? No Did you need to seek medical attention at a hospital or doctor's office? Yes When did it last happen?Many years ago If all above answers are "NO", may proceed with cephalosporin use.    . Tomato Hives and Rash    (too much) When eats too much acidic or spicy foods she breaks out.    Physical Exam: Constitutional: in no apparent distress  Vitals:   08/27/19 1315  08/27/19 1328  BP: (!) 144/87 125/78  Pulse: 65 67  Resp: 18 16  Temp: 97.8 F (36.6 C) 97.8 F (36.6 C)  SpO2: 100% 99%   EYES: anicteric Cardiovascular: Cor RRR Respiratory: CTA B; normal respiratory effort GI: Bowel sounds are normal, liver is not enlarged, spleen is not enlarged Musculoskeletal: left shoulder in a sling; no edema Skin: negatives: no rash Hematologic: no cervical lad  Lab Results  Component Value Date   WBC 5.1 08/19/2019   HGB 12.9 08/19/2019   HCT 41.8 08/19/2019   MCV 91.3 08/19/2019   PLT 228 08/19/2019    Lab Results  Component Value Date   CREATININE 0.90 08/19/2019   BUN 12 08/19/2019   NA 139 08/19/2019   K 4.1 08/19/2019   CL 105 08/19/2019   CO2 24 08/19/2019    Lab Results  Component Value Date   ALT 18 09/15/2016   AST 29 09/15/2016   ALKPHOS 86 09/15/2016     Microbiology: Recent Results (from the past 240 hour(s))  Surgical pcr screen     Status: None   Collection Time: 08/19/19 10:41 AM   Specimen: Nasal Mucosa; Nasal Swab  Result Value Ref Range Status   MRSA, PCR NEGATIVE NEGATIVE Final   Staphylococcus aureus NEGATIVE NEGATIVE Final    Comment: (NOTE) The Xpert SA Assay (FDA approved for NASAL specimens in patients 80 years of age and older), is one component of a comprehensive surveillance program. It is not intended to diagnose infection nor to guide or monitor treatment. Performed at [facility], [place] [address] [place], [place] [address]   Novel Coronavirus, NAA [facility] order, Send-out to Ref Lab; TAT 18-24 hrs     Status: None   Collection Time: 08/23/19 12:02 PM   Specimen: Nasopharyngeal Swab; Respiratory  Result Value Ref Range Status   SARS-CoV-2, NAA NOT DETECTED NOT DETECTED Final    Comment: (NOTE) This nucleic acid amplification test was developed and its performance characteristics determined by [employer]. Nucleic acid amplification tests include PCR and TMA. This test has  not been FDA cleared or approved. This test has been authorized by FDA under an Emergency Use Authorization (EUA). This test is only authorized for the duration of time the declaration that circumstances exist justifying the authorization of the emergency use of in vitro diagnostic tests for detection of SARS-CoV-2 virus and/or diagnosis of COVID-19 infection under section 564(b)(1) of the Act, 21 U.S.C. [id number]) (1), unless the authorization is terminated or  revoked sooner. When diagnostic testing is negative, the possibility of a false negative result should be considered in the context of a patient's recent exposures and the presence of clinical signs and symptoms consistent with COVID-19. An individual without symptoms of COVID- 19 and who is not shedding SARS-CoV-2 vi rus would expect to have a negative (not detected) result in this assay. Performed At: [facility] [address] [place], [place] [id number] Rush Farmer MD [id number]    [place]  Final    Comment: Performed at [facility], [place] [address], [place], [place] [address]  Aerobic/Anaerobic Culture (surgical/deep wound)     Status: None (Preliminary result)   Collection Time: 08/27/19  9:25 AM   Specimen: Synovial, Left Shoulder; Body Fluid  Result Value Ref Range Status   Specimen Description   Final    TISSUE LEFT SHOULDER SPEC A TISSUE ATTACHED TO HARDWARE Performed at [facility], [address], [place], [place] [address]    Special Requests   Final    NONE Performed at [facility], [place] [address], [place], [place] [address]    Gram Stain PENDING  Incomplete   Culture PENDING  Incomplete   Report Status PENDING  Incomplete  Aerobic/Anaerobic Culture (surgical/deep wound)     Status: None (Preliminary result)   Collection Time: 08/27/19  9:34 AM   Specimen: Synovial, Left Shoulder; Body Fluid  Result Value Ref Range Status    Specimen Description   Final    BONE LEFT SHOULDER SPEC D Performed at [facility], [address] [place], [place] [address]    Special Requests   Final    NONE Performed at [facility], [place] [address] [place], [place] [address]    Gram Stain PENDING  Incomplete   Culture PENDING  Incomplete   Report Status PENDING  Incomplete  Aerobic/Anaerobic Culture (surgical/deep wound)     Status: None (Preliminary result)   Collection Time: 08/27/19  9:35 AM   Specimen: Synovial, Left Shoulder; Body Fluid  Result Value Ref Range Status   Specimen Description   Final    BONE LEFT SHOULDER SPEC E Performed at [facility], [place] [address] [place], [place] [address]    Special Requests   Final    NONE Performed at [facility], [place] [address], [place], [place] [address]    Gram Stain PENDING  Incomplete   Culture PENDING  Incomplete   Report Status PENDING  Incomplete  Aerobic/Anaerobic Culture (surgical/deep wound)     Status: None (Preliminary result)   Collection Time: 08/27/19 10:19 AM   Specimen: Synovial, Left Shoulder; Body Fluid  Result Value Ref Range Status   Specimen Description   Final    TISSUE LEFT SHOULDER SPEC B Performed at [facility], [place] [address] [place],  [address]    Special Requests   Final    NONE Performed at [facility], [place] [address] [place],  [address]    Gram Stain PENDING  Incomplete   Culture PENDING  Incomplete   Report Status PENDING  Incomplete  Aerobic/Anaerobic Culture (surgical/deep wound)     Status: None (Preliminary result)   Collection Time: 08/27/19 10:19 AM   Specimen: Synovial, Left Shoulder; Body Fluid  Result Value Ref Range Status   Specimen Description   Final    TISSUE LEFT SHOULDER SPEC C TISSUE ATTACHED TO HARDWARE Performed at [facility], [place] [address] [place], [place] [address]    Special Requests   Final    NONE  Performed at [facility], [place] [address], [place], [place] [address]    Gram Stain PENDING  Incomplete   Culture PENDING  Incomplete   Report Status PENDING  Incomplete    Thayer Headings, [email or web address] 08/27/2019, 2:27 PM

## 2019-08-28 ENCOUNTER — Other Ambulatory Visit: Payer: Self-pay | Admitting: Internal Medicine

## 2019-08-28 DIAGNOSIS — Z96612 Presence of left artificial shoulder joint: Secondary | ICD-10-CM

## 2019-08-28 MED ORDER — PENICILLIN G POTASSIUM IV (FOR PTA / DISCHARGE USE ONLY): 20.0000 10*6.[IU] | INTRAVENOUS | 0 refills | Status: DC

## 2019-08-28 MED ORDER — DIPHENHYDRAMINE HCL 25 MG PO CAPS: 25.0000 mg | ORAL_CAPSULE | Freq: Four times a day (QID) | ORAL | 2 refills | Status: DC | PRN

## 2019-08-28 MED ORDER — ONDANSETRON HCL 4 MG PO TABS: 4.0000 mg | ORAL_TABLET | Freq: Three times a day (TID) | ORAL | 1 refills | Status: AC | PRN

## 2019-08-28 MED ORDER — OXYCODONE HCL 5 MG PO TABS: ORAL_TABLET | ORAL | 0 refills | Status: AC

## 2019-08-28 MED ORDER — OXYCODONE HCL 5 MG PO TABS: ORAL_TABLET | ORAL | 0 refills | Status: DC

## 2019-08-28 MED ORDER — CELECOXIB 200 MG PO CAPS: 200.0000 mg | ORAL_CAPSULE | Freq: Two times a day (BID) | ORAL | 1 refills | Status: AC

## 2019-08-28 MED ORDER — DIPHENHYDRAMINE HCL 25 MG PO TABS: 25.0000 mg | ORAL_TABLET | Freq: Four times a day (QID) | ORAL | 0 refills | Status: DC | PRN

## 2019-08-28 MED ORDER — ACETAMINOPHEN 500 MG PO TABS: 1000.0000 mg | ORAL_TABLET | Freq: Three times a day (TID) | ORAL | 0 refills | Status: AC

## 2019-08-28 NOTE — Plan of Care (Signed)
Problem: Education: Goal: Knowledge of General Education information will improve Description: Including pain rating scale, medication(s)/side effects and non-pharmacologic comfort measures 08/28/2019 1753 by Hubert Azure, RN Outcome: Adequate for Discharge 08/28/2019 1137 by Hubert Azure, RN Outcome: Adequate for Discharge   Problem: Health Behavior/Discharge Planning: Goal: Ability to manage health-related needs will improve 08/28/2019 1753 by Hubert Azure, RN Outcome: Adequate for Discharge 08/28/2019 1137 by Hubert Azure, RN Outcome: Adequate for Discharge   Problem: Clinical Measurements: Goal: Ability to maintain clinical measurements within normal limits will improve 08/28/2019 1753 by Hubert Azure, RN Outcome: Adequate for Discharge 08/28/2019 1137 by Hubert Azure, RN Outcome: Adequate for Discharge Goal: Will remain free from infection 08/28/2019 1753 by Hubert Azure, RN Outcome: Adequate for Discharge 08/28/2019 1137 by Hubert Azure, RN Outcome: Adequate for Discharge Goal: Diagnostic test results will improve 08/28/2019 1753 by Hubert Azure, RN Outcome: Adequate for Discharge 08/28/2019 1137 by Hubert Azure, RN Outcome: Adequate for Discharge Goal: Respiratory complications will improve 08/28/2019 1753 by Hubert Azure, RN Outcome: Adequate for Discharge 08/28/2019 1137 by Hubert Azure, RN Outcome: Adequate for Discharge Goal: Cardiovascular complication will be avoided 08/28/2019 1753 by Hubert Azure, RN Outcome: Adequate for Discharge 08/28/2019 1137 by Hubert Azure, RN Outcome: Adequate for Discharge   Problem: Activity: Goal: Risk for activity intolerance will decrease 08/28/2019 1753 by Hubert Azure, RN Outcome: Adequate for Discharge 08/28/2019 1137 by Hubert Azure, RN Outcome: Adequate for Discharge   Problem: Nutrition: Goal: Adequate nutrition will be maintained 08/28/2019 1753 by Hubert Azure, RN Outcome:  Adequate for Discharge 08/28/2019 1137 by Hubert Azure, RN Outcome: Adequate for Discharge   Problem: Coping: Goal: Level of anxiety will decrease 08/28/2019 1753 by Hubert Azure, RN Outcome: Adequate for Discharge 08/28/2019 1137 by Hubert Azure, RN Outcome: Adequate for Discharge   Problem: Elimination: Goal: Will not experience complications related to bowel motility 08/28/2019 1753 by Hubert Azure, RN Outcome: Adequate for Discharge 08/28/2019 1137 by Hubert Azure, RN Outcome: Adequate for Discharge Goal: Will not experience complications related to urinary retention 08/28/2019 1753 by Hubert Azure, RN Outcome: Adequate for Discharge 08/28/2019 1137 by Hubert Azure, RN Outcome: Adequate for Discharge   Problem: Pain Managment: Goal: General experience of comfort will improve 08/28/2019 1753 by Hubert Azure, RN Outcome: Adequate for Discharge 08/28/2019 1137 by Hubert Azure, RN Outcome: Adequate for Discharge   Problem: Safety: Goal: Ability to remain free from injury will improve 08/28/2019 1753 by Hubert Azure, RN Outcome: Adequate for Discharge 08/28/2019 1137 by Hubert Azure, RN Outcome: Adequate for Discharge   Problem: Skin Integrity: Goal: Risk for impaired skin integrity will decrease 08/28/2019 1753 by Hubert Azure, RN Outcome: Adequate for Discharge 08/28/2019 1137 by Hubert Azure, RN Outcome: Adequate for Discharge   Problem: Education: Goal: Knowledge of the prescribed therapeutic regimen will improve 08/28/2019 1753 by Hubert Azure, RN Outcome: Adequate for Discharge 08/28/2019 1137 by Hubert Azure, RN Outcome: Adequate for Discharge Goal: Understanding of activity limitations/precautions following surgery will improve 08/28/2019 1753 by Hubert Azure, RN Outcome: Adequate for Discharge 08/28/2019 1137 by Hubert Azure, RN Outcome: Adequate for Discharge Goal: Individualized Educational Video(s) 08/28/2019 1753 by  Hubert Azure, RN Outcome: Adequate for Discharge 08/28/2019 1137 by Hubert Azure, RN Outcome: Adequate for Discharge   Problem: Activity: Goal: Ability to tolerate increased activity will  improve 08/28/2019 1753 by Hubert Azure, RN Outcome: Adequate for Discharge 08/28/2019 1137 by Hubert Azure, RN Outcome: Adequate for Discharge   Problem: Pain Management: Goal: Pain level will decrease with appropriate interventions 08/28/2019 1753 by Hubert Azure, RN Outcome: Adequate for Discharge 08/28/2019 1137 by Hubert Azure, RN Outcome: Adequate for Discharge

## 2019-08-28 NOTE — TOC Progression Note (Signed)
Transition of Care [facility]) - Progression Note    Patient Details  Name: Amanda Christensen MRN: [id number] Date of Birth: 1961-04-09  Transition of Care [facility]) CM/SW Contact  Leeroy Cha, RN Phone Number: 08/28/2019, 2:10 PM  Clinical Narrative:    [facility] abx [facility] through [facility]   Expected Discharge Plan: [place] Barriers to Discharge: No Barriers Identified  Expected Discharge Plan and Services Expected Discharge Plan: [place]   Discharge Planning Services: CM Consult Post Acute Care Choice: [place] arrangements for the past 2 months: Single Family Home Expected Discharge Date: 08/28/19                         HH Arranged: RN, IV Antibiotics HH Agency: [place] ([place]) Date HH Agency Contacted: 08/28/19 Time [place]: 1409 Representative spoke with at [place]: [place] ([place]) Interventions    Readmission Risk Interventions No flowsheet data found.

## 2019-08-28 NOTE — Evaluation (Signed)
Occupational Therapy Evaluation Patient Details Name: Amanda Christensen MRN: [id number] DOB: 11/14/1961 Today's Date: 08/28/2019    History of Present Illness 58 year old female s/p revison of L TSA to L RTSA.  PMH:  mutliple orthopedic sxs, chronic pain, arthritis, COPD, depression, HTN, anxiety, szs,  and PVD   Clinical Impression   Pt was admitted for the above sx.  All education was done with pt. She has had other shoulder sxs but has not had this abductor sling. Will further educate family on donning and precautions\     Follow Up Recommendations  (assist for adls, initially mobility)    Equipment Recommendations  3 in 1 bedside commode;Tub/shower bench    Recommendations for Other Services       Precautions / Restrictions Precautions Precautions: Shoulder Type of Shoulder Precautions: sling on at all times x bathing, dressing and exercises. May move elbow, wrist and hand.  No shoulder movement Restrictions LUE Weight Bearing: Non weight bearing      Mobility Bed Mobility Overal bed mobility: Needs Assistance             General bed mobility comments: min A for OOB with HOB raised  Transfers Overall transfer level: Needs assistance Equipment used: None             General transfer comment: min guard to stand and transfer for safety    Balance                                           ADL either performed or assessed with clinical judgement   ADL Overall ADL's : Needs assistance/impaired Eating/Feeding: Set up   Grooming: Set up   Upper Body Bathing: Moderate assistance   Lower Body Bathing: Moderate assistance   Upper Body Dressing : Moderate assistance   Lower Body Dressing: Maximal assistance   Toilet Transfer: Min guard;Stand-pivot(to chair)   Toileting- Clothing Manipulation and Hygiene: Minimal assistance         General ADL Comments: performed ADL, educated on sling and ice machine as well as allowed exercises      Vision         Perception     Praxis      Pertinent Vitals/Pain Pain Assessment: No/denies pain     Hand Dominance Right   Extremity/Trunk Assessment Upper Extremity Assessment Upper Extremity Assessment: LUE deficits/detail(block in effect; starting to wiggle fingers, wrist lateral )           Communication Communication Communication: No difficulties   Cognition Arousal/Alertness: Awake/alert Behavior During Therapy: WFL for tasks assessed/performed Overall Cognitive Status: Within Functional Limits for tasks assessed                                     General Comments       Exercises     Shoulder Instructions      Home Living Family/patient expects to be discharged to:: Private residence Living Arrangements: Spouse/significant other Available Help at Discharge: Family               Bathroom Shower/Tub: Teacher, early years/pre: Standard         Additional Comments: has a grab bar on L side of toilet. Has an aide come in 2 hours day.  Prior Functioning/Environment Level of Independence: Needs assistance        Comments: aide for adls        OT Problem List: Decreased strength;Impaired UE functional use      OT Treatment/Interventions: Self-care/ADL training;DME and/or AE instruction;Patient/family education;Therapeutic exercise    OT Goals(Current goals can be found in the care plan section) Acute Rehab OT Goals Patient Stated Goal: return to PLOF OT Goal Formulation: With patient Time For Goal Achievement: 08/29/19 Potential to Achieve Goals: Good ADL Goals Additional ADL Goal #1: caregiver will don sling with supervision/cues and verbalize understanding of shoulder precautions  OT Frequency: Min 2X/week   Barriers to D/C:            Co-evaluation              AM-PAC OT "6 Clicks" Daily Activity     Outcome Measure Help from another person eating meals?: A Little Help from another  person taking care of personal grooming?: A Little Help from another person toileting, which includes using toliet, bedpan, or urinal?: A Little Help from another person bathing (including washing, rinsing, drying)?: A Lot Help from another person to put on and taking off regular upper body clothing?: A Lot Help from another person to put on and taking off regular lower body clothing?: A Lot 6 Click Score: 15   End of Session    Activity Tolerance: Patient tolerated treatment well Patient left: in chair;with call bell/phone within reach  OT Visit Diagnosis: Muscle weakness (generalized) (M62.81)                Time: [id number] OT Time Calculation (min): 47 min Charges:  OT General Charges $OT Visit: 1 Visit OT Evaluation $OT Eval Low Complexity: 1 Low OT Treatments $Self Care/Home Management : 8-22 mins  Lesle Chris, OTR/L Acute Rehabilitation Services [phone] [facility] pager [phone] office 08/28/2019  Shyheim Tanney 08/28/2019, 1:12 PM

## 2019-08-28 NOTE — Progress Notes (Signed)
PHARMACY CONSULT NOTE FOR:  OUTPATIENT  PARENTERAL ANTIBIOTIC THERAPY (OPAT)  Indication: Prosthetic joint infection Regimen: Penicillin G 20 million units over 24hr as continuous infusion End date: 09/23/2019  IV antibiotic discharge orders are pended. To discharging provider:  please sign these orders via discharge navigator,  Select New Orders & click on the button choice - Manage This Unsigned Work.     Thank you for allowing pharmacy to be a part of this patient's care.  Doreene Eland, PharmD, BCPS.   Work Cell: [phone] 08/28/2019 8:52 AM

## 2019-08-28 NOTE — Progress Notes (Signed)
   08/28/19 1500  OT Visit Information  Last OT Received On 08/28/19  Assistance Needed +1  History of Present Illness 58 year old female s/p revison of L TSA to L RTSA.  PMH:  mutliple orthopedic sxs, chronic pain, arthritis, COPD, depression, HTN, anxiety, szs,  and PVD  Precautions  Precautions Shoulder  Type of Shoulder Precautions sling on at all times x bathing, dressing and exercises. May move elbow, wrist and hand.  No shoulder movement  Pain Assessment  Pain Assessment No/denies pain  Cognition  Arousal/Alertness Awake/alert  Behavior During Therapy WFL for tasks assessed/performed  Overall Cognitive Status Within Functional Limits for tasks assessed  ADL  General ADL Comments husband present for family education. He donned sling with min A and cues. He states he feels comfortable with this. Also educated on ice machine  Bed Mobility  General bed mobility comments min/mod A for back to bed  Restrictions  LUE Weight Bearing NWB  Transfers  Equipment used None  General transfer comment min guard to stand and transfer for safety  OT - End of Session  Activity Tolerance Patient tolerated treatment well  Patient left in chair;with call bell/phone within reach  OT Assessment/Plan  OT Visit Diagnosis Muscle weakness (generalized) (M62.81)  OT Frequency (ACUTE ONLY) Min 2X/week  Follow Up Recommendations Follow surgeon's recommendation for DC plan and follow-up therapies (follow up with Dr Griffin Basil)  OT Equipment 3 in 1 bedside commode;Tub/shower seat (tub bench won't work in Forensic psychologist)  AM-PAC OT "6 Clicks" Daily Activity Outcome Measure (Version 2)  Help from another person eating meals? 3  Help from another person taking care of personal grooming? 3  Help from another person toileting, which includes using toliet, bedpan, or urinal? 3  Help from another person bathing (including washing, rinsing, drying)? 3  Help from another person to put on and taking off regular upper body  clothing? 3  Help from another person to put on and taking off regular lower body clothing? 3  6 Click Score 18  OT Goal Progression  Progress towards OT goals Goals met/education completed, patient discharged from OT  OT Time Calculation  OT Start Time (ACUTE ONLY) 1404  OT Stop Time (ACUTE ONLY) 1430  OT Time Calculation (min) 26 min  OT General Charges  $OT Visit 1 Visit  OT Treatments  $Self Care/Home Management  8-22 mins  $Therapeutic Activity 8-22 mins  Lesle Chris, OTR/L Acute Rehabilitation Services [phone] [facility] pager [phone] office 08/28/2019

## 2019-08-28 NOTE — Plan of Care (Signed)
  Problem: Education: Goal: Knowledge of General Education information will improve Description: Including pain rating scale, medication(s)/side effects and non-pharmacologic comfort measures Outcome: Adequate for Discharge   Problem: Health Behavior/Discharge Planning: Goal: Ability to manage health-related needs will improve Outcome: Adequate for Discharge   Problem: Clinical Measurements: Goal: Ability to maintain clinical measurements within normal limits will improve Outcome: Adequate for Discharge Goal: Will remain free from infection Outcome: Adequate for Discharge Goal: Diagnostic test results will improve Outcome: Adequate for Discharge Goal: Respiratory complications will improve Outcome: Adequate for Discharge Goal: Cardiovascular complication will be avoided Outcome: Adequate for Discharge   Problem: Activity: Goal: Risk for activity intolerance will decrease Outcome: Adequate for Discharge   Problem: Nutrition: Goal: Adequate nutrition will be maintained Outcome: Adequate for Discharge   Problem: Coping: Goal: Level of anxiety will decrease Outcome: Adequate for Discharge   Problem: Elimination: Goal: Will not experience complications related to bowel motility Outcome: Adequate for Discharge Goal: Will not experience complications related to urinary retention Outcome: Adequate for Discharge   Problem: Pain Managment: Goal: General experience of comfort will improve Outcome: Adequate for Discharge   Problem: Safety: Goal: Ability to remain free from injury will improve Outcome: Adequate for Discharge   Problem: Skin Integrity: Goal: Risk for impaired skin integrity will decrease Outcome: Adequate for Discharge   Problem: Education: Goal: Knowledge of the prescribed therapeutic regimen will improve Outcome: Adequate for Discharge Goal: Understanding of activity limitations/precautions following surgery will improve Outcome: Adequate for  Discharge Goal: Individualized Educational Video(s) Outcome: Adequate for Discharge   Problem: Activity: Goal: Ability to tolerate increased activity will improve Outcome: Adequate for Discharge   Problem: Pain Management: Goal: Pain level will decrease with appropriate interventions Outcome: Adequate for Discharge   

## 2019-08-28 NOTE — Progress Notes (Signed)
[facility] for Infectious Disease   Reason for visit: Follow up on PJI  Interval History: no growth on any cultures, afebrile.  Some itching since starting penicillin but ok with Benadryl.  No rash, no diarrhea.  Improved pain.  No complaints.  Eating better.   Physical Exam: Constitutional:  Vitals:   08/28/19 0555 08/28/19 0948  BP: 140/87   Pulse: 73 69  Resp: 18 17  Temp: 98 F (36.7 C) 99 F (37.2 C)  SpO2: 100% 100%   patient appears in NAD Eyes: anicteric Respiratory: Normal respiratory effort; CTA B Cardiovascular: RRR GI: soft, nt, nd MS: left shoulder wrapped Skin: no rashes  Review of Systems: Constitutional: negative for fevers and chills Gastrointestinal: negative for diarrhea Integument/breast: negative for rash and skin lesion(s) Hematologic/lymphatic: negative for lymphadenopathy  Lab Results  Component Value Date   WBC 5.1 08/19/2019   HGB 12.9 08/19/2019   HCT 41.8 08/19/2019   MCV 91.3 08/19/2019   PLT 228 08/19/2019    Lab Results  Component Value Date   CREATININE 0.90 08/19/2019   BUN 12 08/19/2019   NA 139 08/19/2019   K 4.1 08/19/2019   CL 105 08/19/2019   CO2 24 08/19/2019    Lab Results  Component Value Date   ALT 18 09/15/2016   AST 29 09/15/2016   ALKPHOS 86 09/15/2016     Microbiology: Recent Results (from the past 240 hour(s))  Surgical pcr screen     Status: None   Collection Time: 08/19/19 10:41 AM   Specimen: Nasal Mucosa; Nasal Swab  Result Value Ref Range Status   MRSA, PCR NEGATIVE NEGATIVE Final   Staphylococcus aureus NEGATIVE NEGATIVE Final    Comment: (NOTE) The Xpert SA Assay (FDA approved for NASAL specimens in patients 74 years of age and older), is one component of a comprehensive surveillance program. It is not intended to diagnose infection nor to guide or monitor treatment. Performed at [facility], [place] [address], [place], [place] [address]   Novel Coronavirus, NAA  [facility] order, Send-out to Ref Lab; TAT 18-24 hrs     Status: None   Collection Time: 08/23/19 12:02 PM   Specimen: Nasopharyngeal Swab; Respiratory  Result Value Ref Range Status   SARS-CoV-2, NAA NOT DETECTED NOT DETECTED Final    Comment: (NOTE) This nucleic acid amplification test was developed and its performance characteristics determined by [employer]. Nucleic acid amplification tests include PCR and TMA. This test has not been FDA cleared or approved. This test has been authorized by FDA under an Emergency Use Authorization (EUA). This test is only authorized for the duration of time the declaration that circumstances exist justifying the authorization of the emergency use of in vitro diagnostic tests for detection of SARS-CoV-2 virus and/or diagnosis of COVID-19 infection under section 564(b)(1) of the Act, 21 U.S.C. 030DTH-4(H) (1), unless the authorization is terminated or revoked sooner. When diagnostic testing is negative, the possibility of a false negative result should be considered in the context of a patient's recent exposures and the presence of clinical signs and symptoms consistent with COVID-19. An individual without symptoms of COVID- 19 and who is not shedding SARS-CoV-2 vi rus would expect to have a negative (not detected) result in this assay. Performed At: [facility] [address] [place], [place] [id number] Rush Farmer MD QA:[id number]    [place]  Final    Comment: Performed at [facility], [place],  [place] [address]  Aerobic/Anaerobic Culture (surgical/deep wound)     Status: None (Preliminary result)   Collection Time: 08/27/19  9:25 AM   Specimen: Synovial, Left Shoulder; Body Fluid  Result Value Ref Range Status   Specimen Description   Final    TISSUE LEFT SHOULDER SPEC A TISSUE ATTACHED TO HARDWARE Performed at [facility], [address], [place], [place] [address]     Special Requests   Final    NONE Performed at [facility] [place] [address], [place], [place] [address]    Gram Stain NO WBC SEEN NO ORGANISMS SEEN   Final   Culture   Final    NO GROWTH < 24 HOURS Performed at [facility], [place] [address] [place], [place] [address]    Report Status PENDING  Incomplete  Aerobic/Anaerobic Culture (surgical/deep wound)     Status: None (Preliminary result)   Collection Time: 08/27/19  9:34 AM   Specimen: Synovial, Left Shoulder; Body Fluid  Result Value Ref Range Status   Specimen Description   Final    BONE LEFT SHOULDER SPEC D Performed at [facility], [address] [place], [place] [address]    Special Requests   Final    NONE Performed at [facility], [place] [address] [place], [place] [address]    Gram Stain   Final    RARE WBC PRESENT, PREDOMINANTLY MONONUCLEAR NO ORGANISMS SEEN    Culture   Final    NO GROWTH < 24 HOURS Performed at [facility], [place] [address] [place], [place] [address]    Report Status PENDING  Incomplete  Aerobic/Anaerobic Culture (surgical/deep wound)     Status: None (Preliminary result)   Collection Time: 08/27/19  9:35 AM   Specimen: Synovial, Left Shoulder; Body Fluid  Result Value Ref Range Status   Specimen Description   Final    BONE LEFT SHOULDER SPEC E Performed at [facility], [place] [address], [place], [place] [address]    Special Requests   Final    NONE Performed at [facility], [place] [address] [place], [place] [address]    Gram Stain NO RBC SEEN NO ORGANISMS SEEN   Final   Culture   Final    NO GROWTH < 24 HOURS Performed at [facility], [place] [address] [place], [place] [address]    Report Status PENDING  Incomplete  Aerobic/Anaerobic Culture (surgical/deep wound)     Status: None (Preliminary result)   Collection Time: 08/27/19 10:19 AM   Specimen: Synovial, Left Shoulder; Body Fluid  Result Value  Ref Range Status   Specimen Description   Final    TISSUE LEFT SHOULDER SPEC B Performed at [facility], [place] [address] [place], [place] [address]    Special Requests   Final    NONE Performed at [facility], [place] [address], [place], [place] [address]    Gram Stain NO WBC SEEN NO ORGANISMS SEEN   Final   Culture   Final    NO GROWTH < 24 HOURS Performed at [facility], [place] [address] [place], [place] [address]    Report Status PENDING  Incomplete  Aerobic/Anaerobic Culture (surgical/deep wound)     Status: None (Preliminary result)   Collection Time: 08/27/19 10:19 AM   Specimen: Synovial, Left Shoulder; Body Fluid  Result Value Ref Range Status   Specimen Description  Final    TISSUE LEFT SHOULDER SPEC C TISSUE ATTACHED TO HARDWARE Performed at [facility], [place] [address] [place], [place] [address]    Special Requests   Final    NONE Performed at [facility], [place] [address], [place], [place] [address]    Gram Stain NO WBC SEEN NO ORGANISMS SEEN   Final   Culture   Final    NO GROWTH < 24 HOURS Performed at [facility], [place] [address] [place], [place] [address]    Report Status PENDING  Incomplete    Impression/Plan:  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  She will need a prolonged course but likely can convert to oral doxycycline after 2-4 weeks of penicilliin if doing well to complete 6 weeks total at least Labs per Home health  2.  Allergy - tolerating penicillin fine except for some itch.  Also getting pain medications which may be causing the itch, hard to tell.  Will remove penicillin as an allergy.   3.  Medication monitoring - will get weekly cmp, cbc Baseline ESR 17, CRP 1.4  4.  Access - picc line placed  She has follow up with me 10/22 at 10:45.  Thanks for consultation

## 2019-08-28 NOTE — Progress Notes (Signed)
picc line hooked up to continuous antibiotic, and discharged with it.

## 2019-08-29 NOTE — Discharge Summary (Signed)
Patient ID: Amanda Christensen MRN: [id number] DOB/AGE: 60-Mar-1962 60 y.o.  Admit date: 08/27/2019 Discharge date: 08/29/2019  Admission Diagnoses: Painful left total shoulder with one positive culture/concern for infection  Discharge Diagnoses:  Active Problems:   S/P reverse total shoulder arthroplasty, left   Past Medical History:  Diagnosis Date  . Arthritis   . Asthma   . Chronic pain   . COPD (chronic obstructive pulmonary disease) ([place])   . Depression    history of   . 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 GERD (gastroesophageal reflux disease)   . Heart murmur   . High cholesterol   . History of anxiety    "used to have anxiety attacks"  . History of bronchitis   . Hypertension   . Insomnia    takes Trazodone nightly  . Obesity   . Peripheral vascular disease ([facility]) 10   rt leg dvt   . Seizures ([place]) 03   was on medications none now off  since 2011  . Shortness of breath dyspnea      Procedures Performed:  1. Revision total shoulder to reverse total shoulder on 08/27/2019 2. PICC line placement on 08/27/2019  Discharged Condition: good  Hospital Course: Patient brought in as an outpatient for surgery.  Tolerated procedure well.  She was kept for monitoring overnight for pain control, medical monitoring postop, and to receive consult from Infectious Disease. Patient has probable PJI of shoulder with history of growth in 1 culture with Cutibacterium.   Infectious Disease recommended insertion of PICC line with IV penicillin for at least 2-4 weeks and then will switch to oral doxycycline. ID will continue to monitor her cultures as an outpatient to assure we do not need to change the patient's antibiotics.   She recalled a previous reaction to PCN in her 60s that caused her to "pass out." She had no SOB, throat swelling, rash, or other concerns for severe reaction to PCN. She was monitored for severe reaction following  first dosage of PCN. No signs of anaphylaxis. She did have some itching with PCN and was given Benadryl.   Following ID consult and PICC line insertion, patient was found to be stable for DC home the day after surgery.    Patient was instructed on specific activity restrictions and all questions were answered.  Consults: Infectious Disease  Labs: Aerobic/Anaerobic/Fungal Cultures   Treatments:  1. Revision total shoulder to reverse total shoulder  2. PICC line placement for IV antibiotics due to concern for infection  Discharge Exam: Dressing CDI and sling well fitting. Distal motor and sensory altered in setting of block. Full and painless passive ROM throughout hand with DPC of 0.  Axillary nerve sensation/motor altered in setting of block and unable to be fully tested.    Disposition: Home with [place]  Patient has first post-op visit scheduled on 10/16 Patient will follow-up with ID on 10/22.   Discharge Instructions    Call MD for:  persistant nausea and vomiting   Complete by: As directed    Call MD for:  redness, tenderness, or signs of infection (pain, swelling, redness, odor or green/yellow discharge around incision site)   Complete by: As directed    Call MD for:  severe uncontrolled pain   Complete by: As directed    Diet - low sodium heart healthy   Complete by: As directed    Discharge instructions  Complete by: As directed    Ophelia Charter MD, MPH [place] [address], [address] [phone] (tel)   [phone] (fax)   [place] may leave the operative dressing in place until your follow-up appointment.  KEEP THE INCISIONS CLEAN AND DRY. Use the provided ice machine or Ice packs as often as possible for the first 3-4 days, then as needed for pain relief.  Keep a layer of cloth or a shirt between your skin and the cooling unit to prevent frost bite as it  can get very cold.  You may shower on Post-Op Day #2. The dressing is water resistant but do not scrub it as it may start to peel up.  You may remove the sling for showering, but keep a water resistant pillow under the arm to keep both the elbow and shoulder away from the body (mimicking the abduction sling). Gently pat the area dry. Do not soak the shoulder in water. Do not go swimming in the pool or ocean until your sutures are removed.  EXERCISES Wear the sling at all times except when doing your exercises. You may remove the sling for showering, but keep the arm across the chest or in a secondary sling.   Accidental/Purposeful External Rotation and shoulder flexion (reaching behind you) is to be avoided at all costs for the first month.  Please perform the exercises:   Elbow / Hand / Wrist  Range of Motion Exercises  FOLLOW-UP If you develop a Fever (>101.5), Redness or Drainage from the surgical incision site, please call our office to arrange for an evaluation. Please call the office to schedule a follow-up appointment for a wound check, 7-10 days post-operatively.  IF YOU HAVE ANY QUESTIONS, PLEASE FEEL FREE TO CALL OUR OFFICE. Ophelia Charter MD, MPH [facility], [place] [address] [phone] (tel)   [phone] (fax)   HELPFUL INFORMATION  Your arm will be in a sling following surgery. You will be in this sling for the next 3-4 weeks.  I will let you know the exact duration at your follow-up visit.  You may be more comfortable sleeping in a semi-seated position the first few nights following surgery.  Keep a pillow propped under the elbow and forearm for comfort.  If you have a recliner type of chair it might be beneficial.  If not that is fine too, but it would be helpful to sleep propped up with pillows behind your operated shoulder as well under your elbow and forearm.  This will reduce pulling on the suture lines.  We suggest you  use the pain medication the first night prior to going to bed, in order to ease any pain when the anesthesia wears off. You should avoid taking pain medications on an empty stomach as it will make you nauseous.  Do not drink alcoholic beverages or take illicit drugs when taking pain medications.  In most states it is against the law to drive while your arm is in a sling. And certainly against the law to drive while taking narcotics.  You may return to work/school in the next couple of days when you feel up to it. Desk work and typing in the sling is fine.  When dressing, put your operative arm in the sleeve first.  When getting undressed, take your operative arm out last.  Loose fitting, button-down shirts are recommended.  Pain medication may  make you constipated.  Below are a few solutions to try in this order: Decrease the amount of pain medication if you aren't having pain. Drink lots of decaffeinated fluids. Drink prune juice and/or each dried prunes  If the first 3 don't work start with additional solutions Take Colace - an over-the-counter stool softener Take Senokot - an over-the-counter laxative Take Miralax - a stronger over-the-counter laxative   Home infusion instructions [place] May follow [place] Dosing Protocol; May administer Cathflo as needed to maintain patency of vascular access device.; Flushing of vascular access device: per [facility] Protocol: 0.9% NaCl pre/post medica...   Complete by: As directed    Instructions: May follow [place] Dosing Protocol   Instructions: May administer Cathflo as needed to maintain patency of vascular access device.   Instructions: Flushing of vascular access device: per [facility] Protocol: 0.9% NaCl pre/post medication administration and prn patency; Heparin 100 u/ml, 8m for implanted ports and Heparin 10u/ml, 572mfor all other central venous catheters.   Instructions: May follow [facility] Anaphylaxis Protocol for First Dose  Administration in the home: 0.9% NaCl at 25-50 ml/hr to maintain IV access for protocol meds. Epinephrine 0.3 ml IV/IM PRN and Benadryl 25-50 IV/IM PRN s/s of anaphylaxis.   Instructions: [facility] Coordinator (RN) to assist per patient IV care needs in the home PRN.   Increase activity slowly   Complete by: As directed      Allergies as of 08/28/2019      Reactions   Tomato Hives, Rash   (too much) When eats too much acidic or spicy foods she breaks out.      Medication List    STOP taking these medications   meloxicam 7.5 MG tablet Commonly known as: MOBIC   traMADol 50 MG tablet Commonly known as: ULTRAM     TAKE these medications   acetaminophen 500 MG tablet Commonly known as: TYLENOL Take 2 tablets (1,000 mg total) by mouth every 8 (eight) hours for 14 days.   albuterol 108 (90 Base) MCG/ACT inhaler Commonly known as: VENTOLIN HFA Inhale 1-2 puffs into the lungs every 6 (six) hours as needed for wheezing or shortness of breath.   albuterol (2.5 MG/3ML) 0.083% nebulizer solution Commonly known as: PROVENTIL Inhale 2.5 mg into the lungs every 6 (six) hours as needed for wheezing or shortness of breath.   aspirin EC 81 MG tablet Take 81 mg by mouth at bedtime.   atorvastatin 20 MG tablet Commonly known as: LIPITOR Take 20 mg by mouth at bedtime.   celecoxib 200 MG capsule Commonly known as: CeleBREX Take 1 capsule (200 mg total) by mouth 2 (two) times daily. What changed:   medication strength  how much to take   diphenhydrAMINE 25 mg capsule Commonly known as: BENADRYL Take 1 capsule (25 mg total) by mouth every 6 (six) hours as needed for itching.   Fish Oil 500 MG Caps Take 500 mg by mouth at bedtime.   fluticasone 50 MCG/ACT nasal spray Commonly known as: FLONASE Place 1 spray into both nostrils daily as needed for allergies or rhinitis.   Fluticasone-Salmeterol 250-50 MCG/DOSE Aepb Commonly known as: ADVAIR Inhale 1 puff into  the lungs daily.   montelukast 10 MG tablet Commonly known as: SINGULAIR Take 10 mg by mouth at bedtime.   ondansetron 4 MG tablet Commonly known as: Zofran Take 1 tablet (4 mg total) by mouth every 8 (eight) hours as needed for up to 7 days for  nausea or vomiting.   oxyCODONE 5 MG immediate release tablet Commonly known as: Oxy IR/ROXICODONE Take 1-2 pills every 6 hrs as needed for pain, no more than 6 per day   pantoprazole 40 MG tablet Commonly known as: PROTONIX Take 40 mg by mouth daily as needed (acid reflux).   Pazeo 0.7 % Soln Generic drug: Olopatadine HCl Place 1 drop into both eyes daily.   penicillin G  IVPB Inject 20 Million Units into the vein continuous for 26 days. Infuse PCN G 20 million units as continuous infusion q24h Indication:  Prosthetic Joint infection Last Day of Therapy:  09/23/2019 Labs - Once weekly:  CBC/D and BMP, Labs - Every other week:  ESR and CRP   Restasis Multidose 0.05 % ophthalmic emulsion Generic drug: cycloSPORINE Place 1 drop into both eyes at bedtime.   traZODone 100 MG tablet Commonly known as: DESYREL Take 100 mg by mouth at bedtime.   triamcinolone cream 0.1 % Commonly known as: KENALOG Apply 1 application topically 2 (two) times daily.   triamcinolone 0.025 % cream Commonly known as: KENALOG Apply 1 application topically 2 (two) times daily as needed for rash.   triamterene-hydrochlorothiazide 37.5-25 MG tablet Commonly known as: MAXZIDE-25 Take 1 tablet by mouth daily.   VITAMIN C PO Take 1 tablet by mouth at bedtime.   VITAMIN D PO Take 1 tablet by mouth at bedtime.            Home Infusion Instuctions  (From admission, onward)         Start     Ordered   08/28/19 0000  Home infusion instructions [facility] May follow [place] Dosing Protocol; May administer Cathflo as needed to maintain patency of vascular access device.; Flushing of vascular access device: per [facility] Protocol: 0.9% NaCl  pre/post medica...    Question Answer Comment  Instructions May follow [place] Dosing Protocol   Instructions May administer Cathflo as needed to maintain patency of vascular access device.   Instructions Flushing of vascular access device: per [facility] Protocol: 0.9% NaCl pre/post medication administration and prn patency; Heparin 100 u/ml, 43m for implanted ports and Heparin 10u/ml, 551mfor all other central venous catheters.   Instructions May follow [facility] Anaphylaxis Protocol for First Dose Administration in the home: 0.9% NaCl at 25-50 ml/hr to maintain IV access for protocol meds. Epinephrine 0.3 ml IV/IM PRN and Benadryl 25-50 IV/IM PRN s/s of anaphylaxis.   Instructions [facility] Infusion Coordinator (RN) to assist per patient IV care needs in the home PRN.      08/28/19 [id number]

## 2019-09-02 ENCOUNTER — Encounter (HOSPITAL_COMMUNITY): Payer: Self-pay | Admitting: Orthopaedic Surgery

## 2019-09-11 ENCOUNTER — Ambulatory Visit (INDEPENDENT_AMBULATORY_CARE_PROVIDER_SITE_OTHER): Payer: Medicaid Other | Admitting: Internal Medicine

## 2019-09-11 ENCOUNTER — Other Ambulatory Visit: Payer: Self-pay

## 2019-09-11 ENCOUNTER — Encounter: Payer: Self-pay | Admitting: Internal Medicine

## 2019-09-11 ENCOUNTER — Telehealth: Payer: Self-pay

## 2019-09-11 VITALS — HR 74 | Temp 97.9°F | Wt 230.0 lb

## 2019-09-11 DIAGNOSIS — Z452 Encounter for adjustment and management of vascular access device: Secondary | ICD-10-CM

## 2019-09-11 DIAGNOSIS — T8450XD Infection and inflammatory reaction due to unspecified internal joint prosthesis, subsequent encounter: Secondary | ICD-10-CM | POA: Diagnosis present

## 2019-09-11 DIAGNOSIS — Z5181 Encounter for therapeutic drug level monitoring: Secondary | ICD-10-CM | POA: Diagnosis not present

## 2019-09-11 MED ORDER — DOXYCYCLINE HYCLATE 100 MG PO TABS: 100.0000 mg | ORAL_TABLET | Freq: Two times a day (BID) | ORAL | 0 refills | Status: DC

## 2019-09-11 NOTE — Assessment & Plan Note (Signed)
Healing well with no new concerns.  I will transition her to oral doxycycline for 4 weeks. She will return about that time and will check then for ESR, CRP

## 2019-09-11 NOTE — Assessment & Plan Note (Signed)
Will get her recent lab results to monitor creat, wbc.

## 2019-09-11 NOTE — Telephone Encounter (Signed)
Patient called office today to follow up on orders for home health to pull picc line today. Patient states that in her visit today she was told that home health would pull picc so she could begin oral antibiotics on 10/23. Per MD's note home health was supposed to remove picc today during home health visit.  Called [facility] with verbal order to pull picc. Spoke with Stanton Kidney who was able to take order. Will have home health come our today to pull picc. [place]

## 2019-09-11 NOTE — Assessment & Plan Note (Signed)
No issues.  I am going to have home health remove today

## 2019-09-11 NOTE — Progress Notes (Signed)
   Subjective:    Patient ID: Amanda Christensen, female    DOB: 1961/01/14, 58 y.o.   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  Her cultures from the recent surgery done on 10/7 all remained negative (though holding for C acnes growth).  She is tolerating the penicillin well with no rash or diarrhea.  No issues with the picc line.    Review of Systems  Constitutional: Negative for chills and fever.  Gastrointestinal: Negative for diarrhea and nausea.  Skin: Negative for rash.       Objective:   Physical Exam Constitutional:      Appearance: Normal appearance.  Eyes:     General: No scleral icterus. Cardiovascular:     Rate and Rhythm: Normal rate and regular rhythm.     Heart sounds: No murmur.  Pulmonary:     Effort: Pulmonary effort is normal.  Musculoskeletal:     Comments: picc line in place with no surrounding erythema  Skin:    Findings: No rash.  Neurological:     Mental Status: She is alert.  Psychiatric:        Mood and Affect: Mood normal.   SH: no tobacco       Assessment & Plan:

## 2019-09-17 LAB — AEROBIC/ANAEROBIC CULTURE W GRAM STAIN (SURGICAL/DEEP WOUND)
Culture: NO GROWTH
Culture: NO GROWTH
Culture: NO GROWTH
Culture: NO GROWTH
Culture: NO GROWTH
Gram Stain: NONE SEEN
Gram Stain: NONE SEEN
Gram Stain: NONE SEEN
Gram Stain: NONE SEEN

## 2019-09-25 LAB — FUNGUS CULTURE WITH STAIN

## 2019-09-25 LAB — FUNGUS CULTURE RESULT

## 2019-09-25 LAB — FUNGAL ORGANISM REFLEX

## 2019-10-01 ENCOUNTER — Ambulatory Visit: Payer: Medicaid Other | Attending: Orthopaedic Surgery | Admitting: Physical Therapy

## 2019-10-01 ENCOUNTER — Other Ambulatory Visit: Payer: Self-pay

## 2019-10-01 ENCOUNTER — Encounter: Payer: Self-pay | Admitting: Physical Therapy

## 2019-10-01 DIAGNOSIS — R293 Abnormal posture: Secondary | ICD-10-CM | POA: Diagnosis present

## 2019-10-01 DIAGNOSIS — M25512 Pain in left shoulder: Secondary | ICD-10-CM

## 2019-10-01 DIAGNOSIS — M25612 Stiffness of left shoulder, not elsewhere classified: Secondary | ICD-10-CM | POA: Diagnosis not present

## 2019-10-01 DIAGNOSIS — R2689 Other abnormalities of gait and mobility: Secondary | ICD-10-CM

## 2019-10-01 DIAGNOSIS — M6281 Muscle weakness (generalized): Secondary | ICD-10-CM | POA: Diagnosis present

## 2019-10-01 NOTE — Patient Instructions (Signed)
Access Code: [email or web address]  URL: [email or web address]  Date: 10/01/2019  Prepared by: Jeral Pinch   Exercises  Putty Squeezes  Elbow Flexion PROM - 10 reps - 4-5x daily  Seated Wrist Circumduction with Dumbbell - 10 reps - 4-5x daily  Standing Backward Shoulder Rolls - 10 reps - 4-5x daily  Standing Cervical Sidebending AROM - 1-2 reps - 10-20 hold - 4-5x daily  Isometric Shoulder Flexion with Ball at Wall - 5-10 reps - 3 hold - 1x daily  Isometric Shoulder Abduction with Ball at [employer] - 10 reps - 3-5 hold - 1x daily  Isometric Shoulder Extension with Ball at [place] - 5-10 reps - 3-5 hold - 1x daily

## 2019-10-01 NOTE — Therapy (Signed)
[place], [place], [address] Phone: [phone]   Fax:  [phone]  Physical Therapy Evaluation  Patient Details  Name: Amanda Christensen MRN: [id number] Date of Birth: Jan 09, 1961 Referring Provider (PT): Dr Ophelia Charter   Encounter Date: 10/01/2019  PT End of Session - 10/01/19 1253    Visit Number  1    Number of Visits  12    Date for PT Re-Evaluation  11/12/19    Authorization Type  MCD    Authorization Time Period  requested initial 3 visits    PT Start Time  1217    PT Stop Time  1302    PT Time Calculation (min)  45 min    Activity Tolerance  Patient limited by pain    Behavior During Therapy  [facility] for tasks assessed/performed       Past Medical History:  Diagnosis Date  . Arthritis   . Asthma   . Chronic pain   . COPD (chronic obstructive pulmonary disease) ([place])   . Depression    history of   . 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 GERD (gastroesophageal reflux disease)   . Heart murmur   . High cholesterol   . History of anxiety    "used to have anxiety attacks"  . History of bronchitis   . Hypertension   . Insomnia    takes Trazodone nightly  . Obesity   . Peripheral vascular disease ([facility]) 10   rt leg dvt   . Seizures ([place]) 03   was on medications none now off  since 2011  . Shortness of breath dyspnea     Past Surgical History:  Procedure Laterality Date  . BREAST EXCISIONAL BIOPSY Left 09/12/2016  . BREAST LUMPECTOMY WITH NEEDLE LOCALIZATION Left 09/13/2016   Procedure: LEFT BREAST LUMPECTOMY WITH NEEDLE LOCALIZATION;  Surgeon: Clovis Riley, MD;  Location: [facility];  Service: General;  Laterality: Left;  . CESAREAN SECTION    . COLONOSCOPY    . DILATION AND CURETTAGE OF UTERUS    . HERNIA REPAIR     umbilical  . JOINT REPLACEMENT    . miscarriage    . REVISION TOTAL SHOULDER TO REVERSE TOTAL SHOULDER Left 08/27/2019   Procedure: REVISION TOTAL SHOULDER TO REVERSE TOTAL SHOULDER;  Surgeon: Hiram Gash, MD;  [facility];  Service: Orthopedics;  Laterality: Left;  . ROTATOR CUFF REPAIR Left   . SHOULDER ARTHROSCOPY Left 05/15/2019   Procedure: SHOULDER ARTHROSCOPY WITH MANIPULATION AND LYSIS OF ADHESIONS;  Surgeon: Hiram Gash, MD;  Location: [facility];  Service: Orthopedics;  Laterality: Left;  . SYNOVIAL BIOPSY Left 05/15/2019   Procedure: SYNOVIAL BIOPSY;  Surgeon: Hiram Gash, MD;  Location: [facility];  Service: Orthopedics;  Laterality: Left;  . TOTAL KNEE ARTHROPLASTY Right   . TOTAL KNEE ARTHROPLASTY Left 09/27/2016   Procedure: LEFT TOTAL KNEE ARTHROPLASTY;  Surgeon: [name], MD;  Location: [facility];  Service: Orthopedics;  Laterality: Left;  . TOTAL SHOULDER ARTHROPLASTY Left 01/08/2015   dr Veverly Fells  . TOTAL SHOULDER ARTHROPLASTY Left 01/08/2015   Procedure: LEFT TOTAL SHOULDER ARTHROPLASTY;  Surgeon: Augustin Schooling, MD;  Location: [facility];  Service: Orthopedics;  Laterality: Left;  . TUBAL LIGATION      There were no vitals filed for this visit.   Subjective Assessment - 10/01/19 1218  Subjective  Pt reports she had a Lt shoulder revision on 08/27/2019 d/t an infection.  She recieved IV antibiotics at home after surgery and now takes it PO. Did not have any HHPT, sling discontinued on Friday.    Patient Stated Goals  be able to lift her Lt arm higher than her shoulder and use it functionally    Currently in Pain?  No/denies   at rest, yes if she attempts any arm movement        [facility] PT Assessment - 10/01/19 0001      Assessment   Medical Diagnosis  Lt reverse total shoulder    Referring Provider (PT)  Dr Ophelia Charter    Onset Date/Surgical Date  08/27/19    Hand Dominance  Right    Next MD Visit  Jan    Prior Therapy  before the revision in July      Precautions   Precautions  Shoulder    Type of Shoulder Precautions  reverse total shoulder  revision.     Precaution Comments  per protocol - scanned into media      Balance Screen   Has the patient fallen in the past 6 months  No      [place] residence    Living Arrangements  Alone      Prior Function   Level of Independence  Needs assistance with ADLs    Vocation  On disability    Leisure  nothing specific      Observation/Other Assessments   Observations  --   visual atrophy around the Lt shoulder   Skin Integrity  steristrips over incision    Other Surveys   Quick Dash    Quick DASH   81.82% limited      Sensation   Light Touch  --   hyper sensitive to touch around the incision   Hot/Cold  Appears Intact      Posture/Postural Control   Posture/Postural Control  Postural limitations    Postural Limitations  Rounded Shoulders;Forward head;Increased thoracic kyphosis      ROM / Strength   AROM / PROM / Strength  PROM;AROM      AROM   AROM Assessment Site  Elbow;Wrist;Cervical;Shoulder    Right/Left Shoulder  --   no AROM Lt shoulder for now, Rt WNL   Right/Left Elbow  Left    Left Elbow Flexion  105    Left Elbow Extension  -35    Right/Left Wrist  --   Lt WNL   Cervical Flexion  to chest with some pulling    Cervical Extension  WNL    Cervical - Right Rotation  68   pulling in neck   Cervical - Left Rotation  58      PROM   PROM Assessment Site  Shoulder    Right/Left Shoulder  Left    Left Shoulder Extension  --   not assesed   Left Shoulder Flexion  85 Degrees    Left Shoulder ABduction  50 Degrees    Left Shoulder Internal Rotation  70 Degrees    Left Shoulder External Rotation  17 Degrees      Palpation   Palpation comment  tight and tender all around tl shoulder , decreased mobility and very tender scar             Quick Dash - 10/01/19 0001    Open a tight or new jar  Severe difficulty  Do heavy household chores (wash walls, wash floors)  Unable    Carry a shopping bag or briefcase   Unable    Wash your back  Unable    Use a knife to cut food  Unable    Recreational activities in which you take some force or impact through your arm, shoulder, or hand (golf, hammering, tennis)  Unable    During the past week, to what extent has your arm, shoulder or hand problem interfered with your normal social activities with family, friends, neighbors, or groups?  Extremely    During the past week, to what extent has your arm, shoulder or hand problem limited your work or other regular daily activities  Extremely    Arm, shoulder, or hand pain.  Severe    Tingling (pins and needles) in your arm, shoulder, or hand  None    Difficulty Sleeping  Moderate difficulty    DASH Score  81.82 %        Objective measurements completed on examination: See above findings.      [facility] Adult PT Treatment/Exercise - 10/01/19 0001      Exercises   Exercises  Other Exercises    Other Exercises   HEP instruction and performance per handout      Modalities   Modalities  Vasopneumatic      Vasopneumatic   Number Minutes Vasopneumatic   10 minutes    Vasopnuematic Location   Shoulder   Lt    Vasopneumatic Pressure  Low    Vasopneumatic Temperature   2*      Manual Therapy   Manual Therapy  Passive ROM    Passive ROM  Lt shoulder for eval measurements             PT Education - 10/01/19 1318    Education Details  HEP and POC    Person(s) Educated  Patient    Methods  Explanation;Demonstration;Handout    Comprehension  Returned demonstration;Verbalized understanding       PT Short Term Goals - 10/01/19 1326      PT SHORT TERM GOAL #1   Title  pt to be I with inital HEP for L shoulder ROM ( 10/15/2019)    Baseline  not performing any shoulder exercises right now    Time  2    Period  Weeks    Status  New    Target Date  10/15/19      PT SHORT TERM GOAL #2   Title  demo Lt elbow ROM and cervical rotation WNL ( 10/15/2019)    Baseline  elbow flex 105, ext -35, rt rotation  68, Lt 58    Time  2    Period  Weeks    Status  New    Target Date  10/15/19      PT SHORT TERM GOAL #3   Title  Pt will be able to tolerate PROM to 120 deg in flexion and 90 deg abduction to show progression towards function ( 10/15/2019)    Baseline  flex 85, abduction 50    Time  2    Period  Weeks    Status  New    Target Date  10/15/19      PT SHORT TERM GOAL #4   Title  ------        PT Long Term Goals - 10/01/19 1333      PT LONG TERM GOAL #1   Title  Pt will be able  to show Independence with HEP upon discharge    Baseline  only perform wrist AROM    Time  6    Period  Weeks    Status  New    Target Date  11/12/19      PT LONG TERM GOAL #2   Title  Pt will be able to use L UE in kitchen to reach to top shelf for home tasks    Baseline  not allowed to use actively at this time.    Time  6    Period  Weeks    Status  New    Target Date  11/12/19      PT LONG TERM GOAL #3   Title  Pt will be able to use LUE for ADLs including hair, grooming with pain controlled <4/10    Baseline  unable to perform    Time  6    Period  Weeks    Status  New    Target Date  11/12/19      PT LONG TERM GOAL #4   Title  Pt will be able to sleep in her bed with min disturbance from pain    Baseline  sleeps in recliner    Time  6    Period  Weeks    Status  New    Target Date  11/12/19      PT LONG TERM GOAL #5   Title  -------             Plan - 10/01/19 1320    Clinical Impression Statement  58 yo female ~ 5 wks s/p Lt total shoulder revision due to infections and complications.  She was on IV antiboitics and now on PO.  She has started over the total shoulder protocol.  Has limited cervical motion, Lt elbow motion, all with pain.  Lt shoulder PROM is less than what is allowed by protocol.   Her incision is hypersensitive to palpation, visible atrophy around the shoulder.  She would benefit from PT to address these defecits and restore functional use of the arm.     Personal Factors and Comorbidities  Past/Current Experience;Comorbidity 3+;Finances;Time since onset of injury/illness/exacerbation    Comorbidities  refer to snapshot    Examination-Activity Limitations  Bathing;Reach Overhead;Bed Mobility;Self Feeding;Sleep;Carry;Dressing;Hygiene/Grooming;Toileting    Examination-Participation Restrictions  Meal Prep;Cleaning;Community Activity;Laundry    Stability/Clinical Decision Making  Evolving/Moderate complexity    Clinical Decision Making  Moderate    Rehab Potential  Good    PT Frequency  2x / week    PT Duration  6 weeks    PT Treatment/Interventions  Iontophoresis 4mg /ml Dexamethasone;Vasopneumatic Device;Patient/family education;Moist Heat;Scar mobilization;Passive range of motion;Therapeutic exercise;Ultrasound;Cryotherapy;Electrical Stimulation;Manual techniques;Taping    PT Next Visit Plan  progress per protocol, shoulder PROM for 6 wks    Consulted and Agree with Plan of Care  Patient       Patient will benefit from skilled therapeutic intervention in order to improve the following deficits and impairments:  Decreased range of motion, Obesity, Impaired UE functional use, Pain, Decreased scar mobility, Postural dysfunction, Decreased strength  Visit Diagnosis: Stiffness of left shoulder, not elsewhere classified - Plan: PT plan of care cert/re-cert  Acute pain of left shoulder - Plan: PT plan of care cert/re-cert  Muscle weakness (generalized) - Plan: PT plan of care cert/re-cert  Abnormal posture - Plan: PT plan of care cert/re-cert  Other abnormalities of gait and mobility - Plan: PT plan of care cert/re-cert  Problem List Patient Active Problem List   Diagnosis Date Noted  . Medication monitoring encounter 09/11/2019  . PICC (peripherally inserted central catheter) in place 09/11/2019  . S/P reverse total shoulder arthroplasty, 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  . 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 Total knee replacement status,  left 09/27/2016  . S/P shoulder replacement 01/08/2015  . Rhabdomyolysis 08/02/2014  . Lactic acidosis 08/02/2014  . Arthritis 08/02/2014  . Hyperlipidemia 08/02/2014  . Asthma 08/02/2014  . Chest pain 03/26/2014  . COPD (chronic obstructive pulmonary disease) ([place]) 03/25/2014  . Hypertension 03/25/2014  . Chest tightness or pressure 03/25/2014  . Supratherapeutic INR 03/25/2014  . Osteoarthritis 03/25/2014  . History of DVT (deep vein thrombosis) 03/25/2014    Jeral Pinch PT  10/01/2019, 1:38 PM  [facility] [address] [place], [place], [address] Phone: [phone]   Fax:  [phone]  Name: ILANA FAHEY MRN: [id number] Date of Birth: 06-29-1961

## 2019-10-08 ENCOUNTER — Other Ambulatory Visit: Payer: Self-pay

## 2019-10-08 ENCOUNTER — Encounter: Payer: Self-pay | Admitting: Physical Therapy

## 2019-10-08 ENCOUNTER — Ambulatory Visit: Payer: Medicaid Other | Admitting: Physical Therapy

## 2019-10-08 DIAGNOSIS — M25612 Stiffness of left shoulder, not elsewhere classified: Secondary | ICD-10-CM | POA: Diagnosis not present

## 2019-10-08 DIAGNOSIS — M25512 Pain in left shoulder: Secondary | ICD-10-CM

## 2019-10-08 DIAGNOSIS — M6281 Muscle weakness (generalized): Secondary | ICD-10-CM

## 2019-10-08 DIAGNOSIS — R2689 Other abnormalities of gait and mobility: Secondary | ICD-10-CM

## 2019-10-08 DIAGNOSIS — R293 Abnormal posture: Secondary | ICD-10-CM

## 2019-10-08 NOTE — Therapy (Signed)
[place], [place], [address] Phone: [phone]   Fax:  [phone]  Physical Therapy Treatment  Patient Details  Name: Amanda Christensen MRN: [id number] Date of Birth: [birth year] Referring Provider (PT): Dr Ophelia Charter   Encounter Date: 10/08/2019  PT End of Session - 10/08/19 1224    Visit Number  2    Number of Visits  12    Date for PT Re-Evaluation  11/12/19    Authorization Type  MCD    Authorization Time Period  3visits, 11/16-11/29    Authorization - Visit Number  1    Authorization - Number of Visits  3    PT Start Time  [id number]    PT Stop Time  1318    PT Time Calculation (min)  54 min    Equipment Utilized During Treatment  Gait belt    Activity Tolerance  Patient tolerated treatment well    Behavior During Therapy  [facility] for tasks assessed/performed       Past Medical History:  Diagnosis Date  . Arthritis   . Asthma   . Chronic pain   . COPD (chronic obstructive pulmonary disease) ([place])   . Depression    history of   . 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 GERD (gastroesophageal reflux disease)   . Heart murmur   . High cholesterol   . History of anxiety    "used to have anxiety attacks"  . History of bronchitis   . Hypertension   . Insomnia    takes Trazodone nightly  . Obesity   . Peripheral vascular disease ([facility]) 10   rt leg dvt   . Seizures ([place]) 03   was on medications none now off  since 2011  . Shortness of breath dyspnea     Past Surgical History:  Procedure Laterality Date  . BREAST EXCISIONAL BIOPSY Left 09/12/2016  . BREAST LUMPECTOMY WITH NEEDLE LOCALIZATION Left 09/13/2016   Procedure: LEFT BREAST LUMPECTOMY WITH NEEDLE LOCALIZATION;  Surgeon: Clovis Riley, MD;  Location: [facility];  Service: General;  Laterality: Left;  . CESAREAN SECTION    . COLONOSCOPY    . DILATION AND CURETTAGE OF UTERUS    . HERNIA REPAIR      umbilical  . JOINT REPLACEMENT    . miscarriage    . REVISION TOTAL SHOULDER TO REVERSE TOTAL SHOULDER Left 08/27/2019   Procedure: REVISION TOTAL SHOULDER TO REVERSE TOTAL SHOULDER;  Surgeon: Hiram Gash, MD;  [facility];  Service: Orthopedics;  Laterality: Left;  . ROTATOR CUFF REPAIR Left   . SHOULDER ARTHROSCOPY Left 05/15/2019   Procedure: SHOULDER ARTHROSCOPY WITH MANIPULATION AND LYSIS OF ADHESIONS;  Surgeon: Hiram Gash, MD;  Location: [facility];  Service: Orthopedics;  Laterality: Left;  . SYNOVIAL BIOPSY Left 05/15/2019   Procedure: SYNOVIAL BIOPSY;  Surgeon: Hiram Gash, MD;  Location: [facility];  Service: Orthopedics;  Laterality: Left;  . TOTAL KNEE ARTHROPLASTY Right   . TOTAL KNEE ARTHROPLASTY Left 09/27/2016   Procedure: LEFT TOTAL KNEE ARTHROPLASTY;  Surgeon: [name], MD;  Location: [facility];  Service: Orthopedics;  Laterality: Left;  . TOTAL SHOULDER ARTHROPLASTY Left 01/08/2015   dr Veverly Fells  . TOTAL SHOULDER ARTHROPLASTY Left 01/08/2015   Procedure: LEFT TOTAL SHOULDER ARTHROPLASTY;  Surgeon: Augustin Schooling, MD;  Location: [facility];  Service: Orthopedics;  Laterality:  Left;  . TUBAL LIGATION      There were no vitals filed for this visit.  Subjective Assessment - 10/08/19 1224    Subjective  Doing her HEP without problems. Has some shoulder pain - did not take meds    Patient Stated Goals  be able to lift her Lt arm higher than her shoulder and use it functionally    Currently in Pain?  Yes    Pain Score  6     Pain Location  Shoulder    Pain Orientation  Left    Pain Descriptors / Indicators  Tightness    Pain Type  Surgical pain    Pain Onset  More than a month ago    Pain Frequency  Constant    Aggravating Factors   moving the arm    Pain Relieving Factors  medication, heat         [facility] PT Assessment - 10/08/19 0001      Assessment   Medical Diagnosis  Lt reverse total shoulder    Referring Provider (PT)  Dr  Ophelia Charter      AROM   Left Elbow Flexion  140    Left Elbow Extension  -5      PROM   Left Shoulder Flexion  100 Degrees    Left Shoulder External Rotation  36 Degrees                   [facility] Adult PT Treatment/Exercise - 10/08/19 0001      Exercises   Exercises  Shoulder      Shoulder Exercises: Supine   External Rotation  AAROM;Left;10 reps   dowel    Flexion  AAROM;Left;10 reps   with dowel   Flexion Limitations  shoulder press AAROM Lt with dowel    Other Supine Exercises  15 reps bicep curls holding small dowel - attempted 2# wt - hurt to extend.     Other Supine Exercises  10 head presses into pillow 5 sec holds      Shoulder Exercises: Prone   Retraction  Strengthening;Left   3x10, with Lt arm off EOB, cues for form.     Modalities   Modalities  Vasopneumatic      Vasopneumatic   Number Minutes Vasopneumatic   10 minutes    Vasopnuematic Location   Shoulder    Vasopneumatic Pressure  Low    Vasopneumatic Temperature   2*      Manual Therapy   Manual Therapy  Passive ROM    Passive ROM  Lt shoulder ER and flexion with grade III mobs at endrange               PT Short Term Goals - 10/01/19 1326      PT SHORT TERM GOAL #1   Title  pt to be I with inital HEP for L shoulder ROM ( 10/15/2019)    Baseline  not performing any shoulder exercises right now    Time  2    Period  Weeks    Status  New    Target Date  10/15/19      PT SHORT TERM GOAL #2   Title  demo Lt elbow ROM and cervical rotation WNL ( 10/15/2019)    Baseline  elbow flex 105, ext -35, rt rotation 68, Lt 58    Time  2    Period  Weeks    Status  New    Target Date  10/15/19  PT SHORT TERM GOAL #3   Title  Pt will be able to tolerate PROM to 120 deg in flexion and 90 deg abduction to show progression towards function ( 10/15/2019)    Baseline  flex 85, abduction 50    Time  2    Period  Weeks    Status  New    Target Date  10/15/19      PT SHORT TERM GOAL #4    Title  ------        PT Long Term Goals - 10/01/19 1333      PT LONG TERM GOAL #1   Title  Pt will be able to show Independence with HEP upon discharge    Baseline  only perform wrist AROM    Time  6    Period  Weeks    Status  New    Target Date  11/12/19      PT LONG TERM GOAL #2   Title  Pt will be able to use L UE in kitchen to reach to top shelf for home tasks    Baseline  not allowed to use actively at this time.    Time  6    Period  Weeks    Status  New    Target Date  11/12/19      PT LONG TERM GOAL #3   Title  Pt will be able to use LUE for ADLs including hair, grooming with pain controlled <4/10    Baseline  unable to perform    Time  6    Period  Weeks    Status  New    Target Date  11/12/19      PT LONG TERM GOAL #4   Title  Pt will be able to sleep in her bed with min disturbance from pain    Baseline  sleeps in recliner    Time  6    Period  Weeks    Status  New    Target Date  11/12/19      PT LONG TERM GOAL #5   Title  -------            Plan - 10/08/19 1309    Clinical Impression Statement  Darianna reports doing well with her inital HEP.  She has a lot fo guarding around the Lt shoulder with PROM of the elbow and shoulder.  PROM did improve from last week.  She was progressed to [facility] of the Lt shoulder today.  Will benefit from continued therapy to restore shoulder and elbow ROM then strengthen per protocol    PT Frequency  2x / week    PT Duration  6 weeks    PT Treatment/Interventions  Iontophoresis 4mg /ml Dexamethasone;Vasopneumatic Device;Patient/family education;Moist Heat;Scar mobilization;Passive range of motion;Therapeutic exercise;Ultrasound;Cryotherapy;Electrical Stimulation;Manual techniques;Taping    PT Next Visit Plan  progress per protocol, shoulder AAROM now at 6 wks, scapular stability    Consulted and Agree with Plan of Care  Patient       Patient will benefit from skilled therapeutic intervention in order to improve the  following deficits and impairments:  Decreased range of motion, Obesity, Impaired UE functional use, Pain, Decreased scar mobility, Postural dysfunction, Decreased strength  Visit Diagnosis: Stiffness of left shoulder, not elsewhere classified  Acute pain of left shoulder  Muscle weakness (generalized)  Abnormal posture  Other abnormalities of gait and mobility     Problem List Patient Active Problem List   Diagnosis Date Noted  .  Medication monitoring encounter 09/11/2019  . PICC (peripherally inserted central catheter) in place 09/11/2019  . S/P reverse total shoulder arthroplasty, 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  . 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 Total knee replacement status, left 09/27/2016  . S/P shoulder replacement 01/08/2015  . Rhabdomyolysis 08/02/2014  . Lactic acidosis 08/02/2014  . Arthritis 08/02/2014  . Hyperlipidemia 08/02/2014  . Asthma 08/02/2014  . Chest pain 03/26/2014  . COPD (chronic obstructive pulmonary disease) ([place]) 03/25/2014  . Hypertension 03/25/2014  . Chest tightness or pressure 03/25/2014  . Supratherapeutic INR 03/25/2014  . Osteoarthritis 03/25/2014  . History of DVT (deep vein thrombosis) 03/25/2014    Manuela Schwartz shaver PT  10/08/2019, 1:13 PM  [facility] [address] [place], [place], [address] Phone: [phone]   Fax:  [phone]  Name: Amanda Christensen MRN: [id number] Date of Birth: [birth year]

## 2019-10-09 ENCOUNTER — Ambulatory Visit: Payer: Medicaid Other | Admitting: Internal Medicine

## 2019-10-09 ENCOUNTER — Encounter: Payer: Self-pay | Admitting: Physical Therapy

## 2019-10-09 ENCOUNTER — Ambulatory Visit: Payer: Medicaid Other | Admitting: Physical Therapy

## 2019-10-09 DIAGNOSIS — M25612 Stiffness of left shoulder, not elsewhere classified: Secondary | ICD-10-CM

## 2019-10-09 DIAGNOSIS — M6281 Muscle weakness (generalized): Secondary | ICD-10-CM

## 2019-10-09 DIAGNOSIS — M25512 Pain in left shoulder: Secondary | ICD-10-CM

## 2019-10-09 NOTE — Therapy (Signed)
[address] [place], [place], [address] Phone: [phone]   Fax:  [phone]  Physical Therapy Treatment  Patient Details  Name: Amanda Christensen MRN: [id number] Date of Birth: Oct 01, 1961 Referring Provider (PT): Dr Ophelia Charter   Encounter Date: 10/09/2019  PT End of Session - 10/09/19 1539    Visit Number  3    Number of Visits  12    Date for PT Re-Evaluation  11/12/19    Authorization Type  MCD    Authorization Time Period  3visits, 11/16-11/29    Authorization - Visit Number  2    Authorization - Number of Visits  3    PT Start Time  [id number]    PT Stop Time  1627    PT Time Calculation (min)  53 min    Activity Tolerance  Patient limited by pain    Behavior During Therapy  [facility] for tasks assessed/performed       Past Medical History:  Diagnosis Date  . Arthritis   . Asthma   . Chronic pain   . COPD (chronic obstructive pulmonary disease) ([place])   . Depression    history of   . 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 GERD (gastroesophageal reflux disease)   . Heart murmur   . High cholesterol   . History of anxiety    "used to have anxiety attacks"  . History of bronchitis   . Hypertension   . Insomnia    takes Trazodone nightly  . Obesity   . Peripheral vascular disease ([facility]) 10   rt leg dvt   . Seizures ([place]) 03   was on medications none now off  since 2011  . Shortness of breath dyspnea     Past Surgical History:  Procedure Laterality Date  . BREAST EXCISIONAL BIOPSY Left 09/12/2016  . BREAST LUMPECTOMY WITH NEEDLE LOCALIZATION Left 09/13/2016   Procedure: LEFT BREAST LUMPECTOMY WITH NEEDLE LOCALIZATION;  Surgeon: Clovis Riley, MD;  Location: [place];  Service: General;  Laterality: Left;  . CESAREAN SECTION    . COLONOSCOPY    . DILATION AND CURETTAGE OF UTERUS    . HERNIA REPAIR     umbilical  . JOINT REPLACEMENT    . miscarriage    .  REVISION TOTAL SHOULDER TO REVERSE TOTAL SHOULDER Left 08/27/2019   Procedure: REVISION TOTAL SHOULDER TO REVERSE TOTAL SHOULDER;  Surgeon: Hiram Gash, MD;  Location: [facility];  Service: Orthopedics;  Laterality: Left;  . ROTATOR CUFF REPAIR Left   . SHOULDER ARTHROSCOPY Left 05/15/2019   Procedure: SHOULDER ARTHROSCOPY WITH MANIPULATION AND LYSIS OF ADHESIONS;  Surgeon: Hiram Gash, MD;  Location: [place];  Service: Orthopedics;  Laterality: Left;  . SYNOVIAL BIOPSY Left 05/15/2019   Procedure: SYNOVIAL BIOPSY;  Surgeon: Hiram Gash, MD;  Location: [place];  Service: Orthopedics;  Laterality: Left;  . TOTAL KNEE ARTHROPLASTY Right   . TOTAL KNEE ARTHROPLASTY Left 09/27/2016   Procedure: LEFT TOTAL KNEE ARTHROPLASTY;  Surgeon: [name], MD;  Location: [place];  Service: Orthopedics;  Laterality: Left;  . TOTAL SHOULDER ARTHROPLASTY Left 01/08/2015   dr Veverly Fells  . TOTAL SHOULDER ARTHROPLASTY Left 01/08/2015   Procedure: LEFT TOTAL SHOULDER ARTHROPLASTY;  Surgeon: Augustin Schooling, MD;  Location: [place];  Service: Orthopedics;  Laterality: Left;  . TUBAL LIGATION  There were no vitals filed for this visit.  Subjective Assessment - 10/09/19 1538    Subjective  Took pain meds (Tylenol) 6/10.    Currently in Pain?  Yes    Pain Score  6            [facility] Adult PT Treatment/Exercise - 10/09/19 0001      Shoulder Exercises: Supine   Other Supine Exercises  shoulder AAROM flexion, chest press, ER x 10 each       Shoulder Exercises: Seated   Retraction  Strengthening;Both;20 reps      Shoulder Exercises: Pulleys   Flexion  2 minutes    Flexion Limitations  sit and stand       Shoulder Exercises: ROM/Strengthening   [place]  flexion and circles seated       Shoulder Exercises: Isometric Strengthening   Flexion  5X10"    Extension  5X10"    External Rotation  5X10"    Internal Rotation  5X10"    ABduction  5X10"      Vasopneumatic    Number Minutes Vasopneumatic   10 minutes    Vasopnuematic Location   Shoulder    Vasopneumatic Pressure  Low    Vasopneumatic Temperature   2*      Manual Therapy   Manual Therapy  Passive ROM    Manual therapy comments  manual isometrics x 8 Er, IR, flex, ext and abd      Passive ROM  all planes (no ext)                PT Short Term Goals - 10/01/19 1326      PT SHORT TERM GOAL #1   Title  pt to be I with inital HEP for L shoulder ROM ( 10/15/2019)    Baseline  not performing any shoulder exercises right now    Time  2    Period  Weeks    Status  New    Target Date  10/15/19      PT SHORT TERM GOAL #2   Title  demo Lt elbow ROM and cervical rotation WNL ( 10/15/2019)    Baseline  elbow flex 105, ext -35, rt rotation 68, Lt 58    Time  2    Period  Weeks    Status  New    Target Date  10/15/19      PT SHORT TERM GOAL #3   Title  Pt will be able to tolerate PROM to 120 deg in flexion and 90 deg abduction to show progression towards function ( 10/15/2019)    Baseline  flex 85, abduction 50    Time  2    Period  Weeks    Status  New    Target Date  10/15/19      PT SHORT TERM GOAL #4   Title  ------        PT Long Term Goals - 10/01/19 1333      PT LONG TERM GOAL #1   Title  Pt will be able to show Independence with HEP upon discharge    Baseline  only perform wrist AROM    Time  6    Period  Weeks    Status  New    Target Date  11/12/19      PT LONG TERM GOAL #2   Title  Pt will be able to use L UE in kitchen to reach to top shelf for home tasks  Baseline  not allowed to use actively at this time.    Time  6    Period  Weeks    Status  New    Target Date  11/12/19      PT LONG TERM GOAL #3   Title  Pt will be able to use LUE for ADLs including hair, grooming with pain controlled <4/10    Baseline  unable to perform    Time  6    Period  Weeks    Status  New    Target Date  11/12/19      PT LONG TERM GOAL #4   Title  Pt will be able to  sleep in her bed with min disturbance from pain    Baseline  sleeps in recliner    Time  6    Period  Weeks    Status  New    Target Date  11/12/19      PT LONG TERM GOAL #5   Title  -------            Plan - 10/09/19 1616    Clinical Impression Statement  AAROM Well tolerated today in supine.  Soft tissue tenderness throughout L shoulder girdle.  Cont POC    PT Treatment/Interventions  Iontophoresis 4mg /ml Dexamethasone;Vasopneumatic Device;Patient/family education;Moist Heat;Scar mobilization;Passive range of motion;Therapeutic exercise;Ultrasound;Cryotherapy;Electrical Stimulation;Manual techniques;Taping    PT Next Visit Plan  progress per protocol, shoulder AAROM now at 6 wks, scapular stability    PT Home Exercise Plan  AAROM cane, pendulum    Consulted and Agree with Plan of Care  Patient       Patient will benefit from skilled therapeutic intervention in order to improve the following deficits and impairments:  Decreased range of motion, Obesity, Impaired UE functional use, Pain, Decreased scar mobility, Postural dysfunction, Decreased strength  Visit Diagnosis: Stiffness of left shoulder, not elsewhere classified  Acute pain of left shoulder  Muscle weakness (generalized)     Problem List Patient Active Problem List   Diagnosis Date Noted  . Medication monitoring encounter 09/11/2019  . PICC (peripherally inserted central catheter) in place 09/11/2019  . S/P reverse total shoulder arthroplasty, 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  . 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 Total knee replacement status, left 09/27/2016  . S/P shoulder replacement 01/08/2015  . Rhabdomyolysis 08/02/2014  . Lactic acidosis 08/02/2014  . Arthritis 08/02/2014  . Hyperlipidemia 08/02/2014  . Asthma 08/02/2014  . Chest pain 03/26/2014  . COPD (chronic obstructive pulmonary disease) ([place]) 03/25/2014  . Hypertension 03/25/2014  . Chest tightness or pressure 03/25/2014  . Supratherapeutic  INR 03/25/2014  . Osteoarthritis 03/25/2014  . History of DVT (deep vein thrombosis) 03/25/2014    Brittne Kawasaki 10/09/2019, 4:21 PM  [facility] [address] [place], [place], [address] Phone: [phone]   Fax:  [phone]  Name: Amanda Christensen MRN: [id number] Date of Birth: 1961/09/27  Raeford Razor, PT 10/09/19 4:22 PM Phone: [phone] Fax: [phone]

## 2019-10-13 ENCOUNTER — Ambulatory Visit: Payer: Medicaid Other | Admitting: Internal Medicine

## 2019-10-14 ENCOUNTER — Ambulatory Visit: Payer: Medicaid Other | Admitting: Physical Therapy

## 2019-10-14 ENCOUNTER — Other Ambulatory Visit: Payer: Self-pay

## 2019-10-14 ENCOUNTER — Encounter: Payer: Self-pay | Admitting: Physical Therapy

## 2019-10-14 DIAGNOSIS — M6281 Muscle weakness (generalized): Secondary | ICD-10-CM

## 2019-10-14 DIAGNOSIS — M25612 Stiffness of left shoulder, not elsewhere classified: Secondary | ICD-10-CM

## 2019-10-14 DIAGNOSIS — M25512 Pain in left shoulder: Secondary | ICD-10-CM

## 2019-10-14 NOTE — Therapy (Signed)
Amanda Christensen, Amanda Christensen, Amanda Christensen Phone: [phone]   Fax:  [phone]  Physical Therapy Treatment  Patient Details  Name: Amanda Christensen MRN: [id number] Date of Birth: 11-Jan-1961 Referring Provider (PT): Dr Ophelia Charter   Encounter Date: 10/14/2019  PT End of Session - 10/14/19 1548    Visit Number  4    Number of Visits  12    Date for PT Re-Evaluation  11/12/19    Authorization Type  MCD - Resubmitted on 11/24    Authorization Time Period  3visits, 11/16-11/29    Authorization - Visit Number  3    Authorization - Number of Visits  3    PT Start Time  7741    PT Stop Time  1632    PT Time Calculation (min)  43 min    Activity Tolerance  Patient limited by pain    Behavior During Therapy  [facility] for tasks assessed/performed       Past Medical History:  Diagnosis Date  . Arthritis   . Asthma   . Chronic pain   . COPD (chronic obstructive pulmonary disease) ([place])   . Depression    history of   . 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 GERD (gastroesophageal reflux disease)   . Heart murmur   . High cholesterol   . History of anxiety    "used to have anxiety attacks"  . History of bronchitis   . Hypertension   . Insomnia    takes Trazodone nightly  . Obesity   . Peripheral vascular disease ([facility]) 10   rt leg dvt   . Seizures ([place]) 03   was on medications none now off  since 2011  . Shortness of breath dyspnea     Past Surgical History:  Procedure Laterality Date  . BREAST EXCISIONAL BIOPSY Left 09/12/2016  . BREAST LUMPECTOMY WITH NEEDLE LOCALIZATION Left 09/13/2016   Procedure: LEFT BREAST LUMPECTOMY WITH NEEDLE LOCALIZATION;  Surgeon: Clovis Riley, MD;  Location: [place];  Service: General;  Laterality: Left;  . CESAREAN SECTION    . COLONOSCOPY    . DILATION AND CURETTAGE OF UTERUS    . HERNIA REPAIR     umbilical  . JOINT REPLACEMENT     . miscarriage    . REVISION TOTAL SHOULDER TO REVERSE TOTAL SHOULDER Left 08/27/2019   Procedure: REVISION TOTAL SHOULDER TO REVERSE TOTAL SHOULDER;  Surgeon: Hiram Gash, MD;  [facility];  Service: Orthopedics;  Laterality: Left;  . ROTATOR CUFF REPAIR Left   . SHOULDER ARTHROSCOPY Left 05/15/2019   Procedure: SHOULDER ARTHROSCOPY WITH MANIPULATION AND LYSIS OF ADHESIONS;  Surgeon: Hiram Gash, MD;  Location: [place];  Service: Orthopedics;  Laterality: Left;  . SYNOVIAL BIOPSY Left 05/15/2019   Procedure: SYNOVIAL BIOPSY;  Surgeon: Hiram Gash, MD;  Location: [place];  Service: Orthopedics;  Laterality: Left;  . TOTAL KNEE ARTHROPLASTY Right   . TOTAL KNEE ARTHROPLASTY Left 09/27/2016   Procedure: LEFT TOTAL KNEE ARTHROPLASTY;  Surgeon: [name], MD;  Location: [facility] [place];  Service: Orthopedics;  Laterality: Left;  . TOTAL SHOULDER ARTHROPLASTY Left 01/08/2015   dr Veverly Fells  . TOTAL SHOULDER ARTHROPLASTY Left 01/08/2015   Procedure: LEFT TOTAL SHOULDER ARTHROPLASTY;  Surgeon: Augustin Schooling, MD;  Location: [place];  Service: Orthopedics;  Laterality: Left;  . TUBAL LIGATION  There were no vitals filed for this visit.  Subjective Assessment - 10/14/19 1552    Subjective  "I am doing okay, I am still having some trouble with lifting it"    Patient Stated Goals  be able to lift her Lt arm higher than her shoulder and use it functionally    Currently in Pain?  Yes    Pain Score  5    last took pain medication 2 hours ago   Pain Type  Surgical pain    Pain Onset  More than a month ago    Pain Frequency  Intermittent         [facility] PT Assessment - 10/14/19 0001      Assessment   Medical Diagnosis  Lt reverse total shoulder    Referring Provider (PT)  Dr Ophelia Charter      PROM   Left Shoulder Flexion  100 Degrees    Left Shoulder ABduction  70 Degrees                   [facility] Adult PT Treatment/Exercise - 10/14/19 0001       Shoulder Exercises: Supine   Protraction  Strengthening;Left;10 reps    Other Supine Exercises  shoulder AAROM flexion, chest press, ER x 10 each       Shoulder Exercises: Isometric Strengthening   Flexion  5X10"    Extension  5X10"    Internal Rotation  5X10"    ABduction  5X10"      Vasopneumatic   Number Minutes Vasopneumatic   10 minutes    Vasopnuematic Location   Shoulder    Vasopneumatic Pressure  Medium    Vasopneumatic Temperature   5      Manual Therapy   Manual therapy comments  MTPR along L upper trap    Passive ROM  working into all planes except for extension   combined with grade I distraction oscillations            PT Education - 10/14/19 1651    Education Details  reviewed HEP.    Person(s) Educated  Patient    Methods  Explanation;Verbal cues    Comprehension  Verbalized understanding;Verbal cues required       PT Short Term Goals - 10/14/19 1553      PT SHORT TERM GOAL #1   Title  pt to be I with inital HEP for L shoulder ROM ( 10/15/2019)    Period  Weeks    Status  Achieved      PT SHORT TERM GOAL #2   Title  demo Lt elbow ROM and cervical rotation WNL ( 10/15/2019)    Period  Weeks    Status  Achieved      PT SHORT TERM GOAL #3   Title  Pt will be able to tolerate PROM to 120 deg in flexion and 90 deg abduction to show progression towards function ( 10/15/2019)    Baseline  flexion 100 degrees, and abduction 70 degrees    Period  Weeks    Status  On-going        PT Long Term Goals - 10/14/19 1559      PT LONG TERM GOAL #1   Title  Pt will be able to show Independence with HEP upon discharge    Baseline  independent with current HEP and progress as able.    Status  On-going    Target Date  11/12/19      PT  LONG TERM GOAL #2   Title  Pt will be able to use L UE in kitchen to reach to top shelf for home tasks    Baseline  not allowed to use actively at this time.    Period  Weeks    Status  On-going      PT LONG TERM GOAL  #3   Title  Pt will be able to use LUE for ADLs including hair, grooming with pain controlled <4/10    Baseline  unable to perform    Period  Weeks      PT LONG TERM GOAL #4   Title  Pt will be able to sleep in her bed with min disturbance from pain    Baseline  sleeps in recliner    Period  Weeks    Status  On-going            Plan - 10/14/19 1647    Clinical Impression Statement  Mrs Genna is making progress toward her goals but continues to demonstated limited AAROM with flexion/ abduction secondary to pain and spasm. She had met all STG's except for STG# 3, but conitnues to exhibit motivation to improve function and reduce pain. focused session to day on AAROM and shoulder isometrics. plan to conitnue with PT POC to worko toward goals and remaining deficits.    PT Treatment/Interventions  Iontophoresis 69m/ml Dexamethasone;Vasopneumatic Device;Patient/family education;Moist Heat;Scar mobilization;Passive range of motion;Therapeutic exercise;Ultrasound;Cryotherapy;Electrical Stimulation;Manual techniques;Taping    PT Next Visit Plan  progress per protocol, shoulder AAROM now at 6 wks, scapular stability    PT Home Exercise Plan  AAROM cane, pendulum    Consulted and Agree with Plan of Care  Patient       Patient will benefit from skilled therapeutic intervention in order to improve the following deficits and impairments:  Decreased range of motion, Obesity, Impaired UE functional use, Pain, Decreased scar mobility, Postural dysfunction, Decreased strength  Visit Diagnosis: Stiffness of left shoulder, not elsewhere classified  Acute pain of left shoulder  Muscle weakness (generalized)     Problem List Patient Active Problem List   Diagnosis Date Noted  . Medication monitoring encounter 09/11/2019  . PICC (peripherally inserted central catheter) in place 09/11/2019  . S/P reverse total shoulder arthroplasty, 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  . Prosthetic joint infection ([place] 07/14/2019   . Total knee replacement status, left 09/27/2016  . S/P shoulder replacement 01/08/2015  . Rhabdomyolysis 08/02/2014  . Lactic acidosis 08/02/2014  . Arthritis 08/02/2014  . Hyperlipidemia 08/02/2014  . Asthma 08/02/2014  . Chest pain 03/26/2014  . COPD ([place] obstructive [place] disease) ([place] 03/25/2014  . Hypertension 03/25/2014  . Chest tightness or pressure 03/25/2014  . Supratherapeutic INR 03/25/2014  . Osteoarthritis 03/25/2014  . History of DVT (deep vein thrombosis) 03/25/2014   KStarr LakePT, DPT, LAT, ATC  10/14/19  4:55 PM      [facility]GCottonwood NAlaska [address]Phone: [phone]  Fax:  [phone] Name: Amanda DEHAANMRN: [id number] of Birth: 912/21/1962

## 2019-10-15 ENCOUNTER — Encounter: Payer: Self-pay | Admitting: Internal Medicine

## 2019-10-15 ENCOUNTER — Ambulatory Visit (INDEPENDENT_AMBULATORY_CARE_PROVIDER_SITE_OTHER): Payer: Medicaid Other | Admitting: Internal Medicine

## 2019-10-15 VITALS — BP 110/75 | HR 89 | Temp 98.0°F | Wt 222.0 lb

## 2019-10-15 DIAGNOSIS — T8450XD Infection and inflammatory reaction due to unspecified internal joint prosthesis, subsequent encounter: Secondary | ICD-10-CM

## 2019-10-15 DIAGNOSIS — Z452 Encounter for adjustment and management of vascular access device: Secondary | ICD-10-CM

## 2019-10-15 NOTE — Assessment & Plan Note (Signed)
This seems resolved.  I will check inflammatory markers again and if no significant concerns. She can remain off of antibiotics

## 2019-10-15 NOTE — Assessment & Plan Note (Signed)
This has been removed and no new issues on the arm.

## 2019-10-15 NOTE — Progress Notes (Signed)
   Subjective:    Patient ID: CLARABELL GRABOSKI, female    DOB: 1961-03-15, 58 y.o.   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   Her cultures from the explantation all remained negative and she completed 2 weeks of IV therapy.  I then placed her on oral doxycycline for 4 weeks.  Feels well now and continuing rehab.     Review of Systems  Constitutional: Negative for chills and fever.  Gastrointestinal: Negative for diarrhea and nausea.  Skin: Negative for rash.       Objective:   Physical Exam Constitutional:      Appearance: Normal appearance.  Eyes:     General: No scleral icterus. Cardiovascular:     Rate and Rhythm: Normal rate and regular rhythm.  Pulmonary:     Effort: Pulmonary effort is normal.  Musculoskeletal:     Comments: Left shoulder with no warmth, no erythema, improved ROM  Skin:    Findings: No rash.  Neurological:     Mental Status: She is alert.  Psychiatric:        Mood and Affect: Mood normal.   SH: no tobacco       Assessment & Plan:

## 2019-10-16 LAB — SEDIMENTATION RATE: Sed Rate: 9 mm/h (ref 0–30)

## 2019-10-16 LAB — C-REACTIVE PROTEIN: CRP: 1.6 mg/L (ref <8.0)

## 2019-10-22 ENCOUNTER — Encounter: Payer: Self-pay | Admitting: Physical Therapy

## 2019-10-22 ENCOUNTER — Other Ambulatory Visit: Payer: Self-pay

## 2019-10-22 ENCOUNTER — Telehealth: Payer: Self-pay | Admitting: *Deleted

## 2019-10-22 ENCOUNTER — Ambulatory Visit: Payer: Medicaid Other | Attending: Orthopaedic Surgery | Admitting: Physical Therapy

## 2019-10-22 DIAGNOSIS — M6281 Muscle weakness (generalized): Secondary | ICD-10-CM | POA: Diagnosis present

## 2019-10-22 DIAGNOSIS — R2689 Other abnormalities of gait and mobility: Secondary | ICD-10-CM | POA: Diagnosis present

## 2019-10-22 DIAGNOSIS — R293 Abnormal posture: Secondary | ICD-10-CM

## 2019-10-22 DIAGNOSIS — M25512 Pain in left shoulder: Secondary | ICD-10-CM | POA: Diagnosis present

## 2019-10-22 DIAGNOSIS — M25612 Stiffness of left shoulder, not elsewhere classified: Secondary | ICD-10-CM

## 2019-10-22 NOTE — Telephone Encounter (Signed)
RN called patient, left message asking her to call back for lab results. Landis Gandy, RN

## 2019-10-22 NOTE — Telephone Encounter (Signed)
-----  Message from Thayer Headings, MD sent at 10/20/2019 10:58 AM EST ----- Let her know her labs (crp, ESR) look great, no concerns and no further treatment indicated, as discussed. thanks

## 2019-10-22 NOTE — Therapy (Signed)
Amanda Christensen, [place], [address] Phone: [phone]   Fax:  [phone]  Physical Therapy Treatment  Patient Details  Name: Amanda Christensen MRN: [id number] Date of Birth: [birth year] Referring Provider (PT): Dr Ophelia Charter   Encounter Date: 10/22/2019  PT End of Session - 10/22/19 1207    Visit Number  5    Number of Visits  12    Date for PT Re-Evaluation  11/12/19    Authorization Type  MCD    Authorization Time Period  10/21/19-11/17/19, 8 visits    Authorization - Visit Number  1    Authorization - Number of Visits  8    PT Start Time  1109    PT Stop Time  [id number]    PT Time Calculation (min)  55 min       Past Medical History:  Diagnosis Date  . Arthritis   . Asthma   . Chronic pain   . COPD (chronic obstructive pulmonary disease) ([facility])   . Depression    history of   . 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 GERD (gastroesophageal reflux disease)   . Heart murmur   . High cholesterol   . History of anxiety    "used to have anxiety attacks"  . History of bronchitis   . Hypertension   . Insomnia    takes Trazodone nightly  . Obesity   . Peripheral vascular disease ([facility]) 10   rt leg dvt   . Seizures ([place]) 03   was on medications none now off  since 2011  . Shortness of breath dyspnea     Past Surgical History:  Procedure Laterality Date  . BREAST EXCISIONAL BIOPSY Left 09/12/2016  . BREAST LUMPECTOMY WITH NEEDLE LOCALIZATION Left 09/13/2016   Procedure: LEFT BREAST LUMPECTOMY WITH NEEDLE LOCALIZATION;  Surgeon: Clovis Riley, MD;  Location: [facility];  Service: General;  Laterality: Left;  . CESAREAN SECTION    . COLONOSCOPY    . DILATION AND CURETTAGE OF UTERUS    . HERNIA REPAIR     umbilical  . JOINT REPLACEMENT    . miscarriage    . REVISION TOTAL SHOULDER TO REVERSE TOTAL SHOULDER Left 08/27/2019   Procedure: REVISION TOTAL SHOULDER TO  REVERSE TOTAL SHOULDER;  Surgeon: Hiram Gash, MD;  Location: [facility];  Service: Orthopedics;  Laterality: Left;  . ROTATOR CUFF REPAIR Left   . SHOULDER ARTHROSCOPY Left 05/15/2019   Procedure: SHOULDER ARTHROSCOPY WITH MANIPULATION AND LYSIS OF ADHESIONS;  Surgeon: Hiram Gash, MD;  Location: [facility];  Service: Orthopedics;  Laterality: Left;  . SYNOVIAL BIOPSY Left 05/15/2019   Procedure: SYNOVIAL BIOPSY;  Surgeon: Hiram Gash, MD;  Location: [facility];  Service: Orthopedics;  Laterality: Left;  . TOTAL KNEE ARTHROPLASTY Right   . TOTAL KNEE ARTHROPLASTY Left 09/27/2016   Procedure: LEFT TOTAL KNEE ARTHROPLASTY;  Surgeon: [name], MD;  Location: [facility];  Service: Orthopedics;  Laterality: Left;  . TOTAL SHOULDER ARTHROPLASTY Left 01/08/2015   dr Veverly Fells  . TOTAL SHOULDER ARTHROPLASTY Left 01/08/2015   Procedure: LEFT TOTAL SHOULDER ARTHROPLASTY;  Surgeon: Augustin Schooling, MD;  Location: [facility];  Service: Orthopedics;  Laterality: Left;  . TUBAL LIGATION      There were no vitals filed for this visit.  Subjective Assessment - 10/22/19 1115    Subjective  5/10  pain today.         [facility] PT Assessment - 10/22/19 0001      PROM   Left Shoulder Flexion  105 Degrees                   [facility] Adult PT Treatment/Exercise - 10/22/19 0001      Shoulder Exercises: Standing   Other Standing Exercises  UE ranger standing fflexion, then horizontals , max cues for shoulder depression     Other Standing Exercises  scap retraction      Shoulder Exercises: Pulleys   Flexion  2 minutes    Flexion Limitations  sittingand standing       Shoulder Exercises: ROM/Strengthening   Ranger  --    Other ROM/Strengthening Exercises  wall ladder flexion x 5  (100 degrees )       Shoulder Exercises: Isometric Strengthening   Flexion  5X10"    Extension  5X10"    External Rotation  5X10"    Internal Rotation  5X10"      Vasopneumatic   Number  Minutes Vasopneumatic   10 minutes    Vasopnuematic Location   Shoulder    Vasopneumatic Pressure  Low    Vasopneumatic Temperature   2 snowflakes       Manual Therapy   Passive ROM  working into all planes except for extension   combined with grade I distraction oscillations              PT Short Term Goals - 10/14/19 1553      PT SHORT TERM GOAL #1   Title  pt to be I with inital HEP for L shoulder ROM ( 10/15/2019)    Period  Weeks    Status  Achieved      PT SHORT TERM GOAL #2   Title  demo Lt elbow ROM and cervical rotation WNL ( 10/15/2019)    Period  Weeks    Status  Achieved      PT SHORT TERM GOAL #3   Title  Pt will be able to tolerate PROM to 120 deg in flexion and 90 deg abduction to show progression towards function ( 10/15/2019)    Baseline  flexion 100 degrees, and abduction 70 degrees    Period  Weeks    Status  On-going        PT Long Term Goals - 10/14/19 1559      PT LONG TERM GOAL #1   Title  Pt will be able to show Independence with HEP upon discharge    Baseline  independent with current HEP and progress as able.    Status  On-going    Target Date  11/12/19      PT LONG TERM GOAL #2   Title  Pt will be able to use L UE in kitchen to reach to top shelf for home tasks    Baseline  not allowed to use actively at this time.    Period  Weeks    Status  On-going      PT LONG TERM GOAL #3   Title  Pt will be able to use LUE for ADLs including hair, grooming with pain controlled <4/10    Baseline  unable to perform    Period  Weeks      PT LONG TERM GOAL #4   Title  Pt will be able to sleep in her bed with min disturbance from pain    Baseline  sleeps in [place] - 10/22/19 1211    Clinical Impression Statement  Mrs. Hemmings reports pain level unchanged. She requires cues for decreased shoulder hike during all elevation exercises. 100 AAROM flexion will wall ladder. She requires  cues to avoid compenstaion with isometrics. Vaso at end of session and pt requested low pressure as the medium pressure last visit increased her pain.    PT Next Visit Plan  progress per protocol, shoulder AAROM now at 6 wks, scapular stability, Vaso on low pressure low cold    PT Home Exercise Plan  AAROM cane, pendulum       Patient will benefit from skilled therapeutic intervention in order to improve the following deficits and impairments:  Decreased range of motion, Obesity, Impaired UE functional use, Pain, Decreased scar mobility, Postural dysfunction, Decreased strength  Visit Diagnosis: Stiffness of left shoulder, not elsewhere classified  Acute pain of left shoulder  Muscle weakness (generalized)  Abnormal posture     Problem List Patient Active Problem List   Diagnosis Date Noted  . Medication monitoring encounter 09/11/2019  . S/P reverse total shoulder arthroplasty, 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  . 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 Total knee replacement status, left 09/27/2016  . S/P shoulder replacement 01/08/2015  . Rhabdomyolysis 08/02/2014  . Arthritis 08/02/2014  . Hyperlipidemia 08/02/2014  . Asthma 08/02/2014  . COPD (chronic obstructive pulmonary disease) ([facility]) 03/25/2014  . Hypertension 03/25/2014  . Chest tightness or pressure 03/25/2014  . Supratherapeutic INR 03/25/2014  . Osteoarthritis 03/25/2014  . History of DVT (deep vein thrombosis) 03/25/2014    Dorene Ar, PTA 10/22/2019, 12:13 PM  [facility] [address] [place], [place], [address] Phone: [phone]   Fax:  [phone]  Name: DONNIA JEMMOTT MRN: [id number] Date of Birth: 1961-10-31

## 2019-10-24 ENCOUNTER — Encounter: Payer: Self-pay | Admitting: Physical Therapy

## 2019-10-24 ENCOUNTER — Ambulatory Visit: Payer: Medicaid Other | Admitting: Physical Therapy

## 2019-10-24 ENCOUNTER — Other Ambulatory Visit: Payer: Self-pay

## 2019-10-24 DIAGNOSIS — M25612 Stiffness of left shoulder, not elsewhere classified: Secondary | ICD-10-CM | POA: Diagnosis not present

## 2019-10-24 DIAGNOSIS — M6281 Muscle weakness (generalized): Secondary | ICD-10-CM

## 2019-10-24 DIAGNOSIS — M25512 Pain in left shoulder: Secondary | ICD-10-CM

## 2019-10-24 DIAGNOSIS — R2689 Other abnormalities of gait and mobility: Secondary | ICD-10-CM

## 2019-10-24 DIAGNOSIS — R293 Abnormal posture: Secondary | ICD-10-CM

## 2019-10-24 NOTE — Therapy (Addendum)
[place], [place], [address] Phone: [phone]   Fax:  [phone]  Physical Therapy Treatment / Discharge  Patient Details  Name: Amanda Christensen MRN: [id number] Date of Birth: 1961/09/10 Referring Provider (PT): Dr Ophelia Charter   Encounter Date: 10/24/2019  PT End of Session - 10/24/19 1057    Visit Number  6    Number of Visits  12    Date for PT Re-Evaluation  11/12/19    Authorization Type  MCD    Authorization Time Period  10/21/19-11/17/19, 8 visits    Authorization - Visit Number  2    Authorization - Number of Visits  8    PT Start Time  1102    PT Stop Time  1155    PT Time Calculation (min)  53 min       Past Medical History:  Diagnosis Date  . Arthritis   . Asthma   . Chronic pain   . COPD (chronic obstructive pulmonary disease) ([place])   . Depression    history of   . 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 GERD (gastroesophageal reflux disease)   . Heart murmur   . High cholesterol   . History of anxiety    "used to have anxiety attacks"  . History of bronchitis   . Hypertension   . Insomnia    takes Trazodone nightly  . Obesity   . Peripheral vascular disease ([facility]) 10   rt leg dvt   . Seizures ([place]) 03   was on medications none now off  since 2011  . Shortness of breath dyspnea     Past Surgical History:  Procedure Laterality Date  . BREAST EXCISIONAL BIOPSY Left 09/12/2016  . BREAST LUMPECTOMY WITH NEEDLE LOCALIZATION Left 09/13/2016   Procedure: LEFT BREAST LUMPECTOMY WITH NEEDLE LOCALIZATION;  Surgeon: Clovis Riley, MD;  Location: [facility];  Service: General;  Laterality: Left;  . CESAREAN SECTION    . COLONOSCOPY    . DILATION AND CURETTAGE OF UTERUS    . HERNIA REPAIR     umbilical  . JOINT REPLACEMENT    . miscarriage    . REVISION TOTAL SHOULDER TO REVERSE TOTAL SHOULDER Left 08/27/2019   Procedure: REVISION TOTAL  SHOULDER TO REVERSE TOTAL SHOULDER;  Surgeon: Hiram Gash, MD;  [facility];  Service: Orthopedics;  Laterality: Left;  . ROTATOR CUFF REPAIR Left   . SHOULDER ARTHROSCOPY Left 05/15/2019   Procedure: SHOULDER ARTHROSCOPY WITH MANIPULATION AND LYSIS OF ADHESIONS;  Surgeon: Hiram Gash, MD;  Location: [facility];  Service: Orthopedics;  Laterality: Left;  . SYNOVIAL BIOPSY Left 05/15/2019   Procedure: SYNOVIAL BIOPSY;  Surgeon: Hiram Gash, MD;  Location: [facility];  Service: Orthopedics;  Laterality: Left;  . TOTAL KNEE ARTHROPLASTY Right   . TOTAL KNEE ARTHROPLASTY Left 09/27/2016   Procedure: LEFT TOTAL KNEE ARTHROPLASTY;  Surgeon: [name], MD;  Location: [facility];  Service: Orthopedics;  Laterality: Left;  . TOTAL SHOULDER ARTHROPLASTY Left 01/08/2015   dr Veverly Fells  . TOTAL SHOULDER ARTHROPLASTY Left 01/08/2015   Procedure: LEFT TOTAL SHOULDER ARTHROPLASTY;  Surgeon: Augustin Schooling, MD;  Location: [facility];  Service: Orthopedics;  Laterality: Left;  . TUBAL LIGATION      There were no vitals filed for this visit.  Subjective Assessment - 10/24/19 1206    Subjective  I canceled the rest of my appointments. I cannot make it. I dont have the money to put in people's gas tanks. I also have my 10 year old grandson that I have to find childcare for everytime I come. I am moving to [place] at the end of the month.    Currently in Pain?  Yes    Pain Score  5     Pain Location  Shoulder    Pain Orientation  Left    Pain Descriptors / Indicators  Tightness    Pain Type  Surgical pain    Aggravating Factors   use of arm, getting comfortable to sleep    Pain Relieving Factors  meds, heat         [facility] PT Assessment - 10/24/19 0001      AROM   Overall AROM Comments  60 degrees of active elevation (scaption)       PROM   Left Shoulder Flexion  110 Degrees    Left Shoulder ABduction  80 Degrees    Left Shoulder External Rotation  36 Degrees                    [facility] Adult PT Treatment/Exercise - 10/24/19 0001      Shoulder Exercises: Supine   Other Supine Exercises  shoulder AAROM flexion, chest press, ER x 10 each       Shoulder Exercises: Seated   Retraction  Strengthening;Both;20 reps      Shoulder Exercises: Pulleys   Flexion  2 minutes    Flexion Limitations  sittingand standing       Shoulder Exercises: ROM/Strengthening   Pendulum  all planes    Other ROM/Strengthening Exercises  seated physioball rollouts     Other ROM/Strengthening Exercises  wall ladder flexion x 5  (100 degrees )       Shoulder Exercises: Isometric Strengthening   Other Isometric Exercises  reviewed how to perform self isometrics  for flexion, ER, IR, Ext with progressive hold time and intensity of push       Vasopneumatic   Number Minutes Vasopneumatic   10 minutes    Vasopnuematic Location   Shoulder    Vasopneumatic Pressure  Low    Vasopneumatic Temperature   2 snow flakes      Manual Therapy   Passive ROM  flexion, ER , abduction, scaption               PT Short Term Goals - 10/24/19 1132      PT SHORT TERM GOAL #1   Title  pt to be I with inital HEP for L shoulder ROM ( 10/15/2019)    Baseline  initial this far    Time  2    Period  Weeks    Status  Achieved      PT SHORT TERM GOAL #2   Title  demo Lt elbow ROM and cervical rotation WNL ( 10/15/2019)    Time  2    Period  Weeks    Status  Achieved      PT SHORT TERM GOAL #3   Title  Pt will be able to tolerate PROM to 120 deg in flexion and 90 deg abduction to show progression towards function ( 10/15/2019)    Baseline  flexion 110 degrees, and abduction 80 degrees    Time  2    Period  Weeks    Status  Not Met  PT Long Term Goals - 10/24/19 1134      PT LONG TERM GOAL #1   Title  Pt will be able to show Independence with HEP upon discharge    Baseline  independent with current HEP and plan to seek PT in [place] after first of year .     Time  6    Period  Weeks    Status  Partially Met      PT LONG TERM GOAL #2   Title  Pt will be able to use L UE in kitchen to reach to top shelf for home tasks    Baseline  not allowed to use actively at this time.    Time  6    Period  Weeks    Status  Not Met      PT LONG TERM GOAL #3   Title  Pt will be able to use LUE for ADLs including hair, grooming with pain controlled <4/10    Baseline  unable to perform    Time  6    Period  Weeks    Status  Not Met      PT LONG TERM GOAL #4   Title  Pt will be able to sleep in her bed with min disturbance from pain    Baseline  sleeps in recliner, pain better, wakes up 1 x per night and takes meds    Time  6    Period  Weeks    Status  Not Met            Plan - 10/24/19 1151    Clinical Impression Statement  Pt arrives and reports she canceled all of her futire appointments due to transportation and childcare conflicts. She declined transportation assistance as she is moving to [place] at the end of the month. Time spent reviewing and emphasizing completion of HEP consistently until she is able to resume PT in [place]. See goal status.    PT Next Visit Plan  discharge to HEP, pt is moving out of state.    PT Home Exercise Plan  AAROM cane, pendulum, verbal instruction of self isometrics       Patient will benefit from skilled therapeutic intervention in order to improve the following deficits and impairments:  Decreased range of motion, Obesity, Impaired UE functional use, Pain, Decreased scar mobility, Postural dysfunction, Decreased strength  Visit Diagnosis: Stiffness of left shoulder, not elsewhere classified  Acute pain of left shoulder  Muscle weakness (generalized)  Abnormal posture  Other abnormalities of gait and mobility     Problem List Patient Active Problem List   Diagnosis Date Noted  . Medication monitoring encounter 09/11/2019  . S/P reverse total shoulder arthroplasty, 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  .  Prosthetic joint infection ([facility]) 07/14/2019  . Total knee replacement status, left 09/27/2016  . S/P shoulder replacement 01/08/2015  . Rhabdomyolysis 08/02/2014  . Arthritis 08/02/2014  . Hyperlipidemia 08/02/2014  . Asthma 08/02/2014  . COPD (chronic obstructive pulmonary disease) ([place]) 03/25/2014  . Hypertension 03/25/2014  . Chest tightness or pressure 03/25/2014  . Supratherapeutic INR 03/25/2014  . Osteoarthritis 03/25/2014  . History of DVT (deep vein thrombosis) 03/25/2014    Dorene Ar, PTA 10/24/2019, 12:12 PM  [facility] [address] [place], [place], [address] Phone: [phone]   Fax:  [phone]  Name: JARIKA ROBBEN MRN: [id number] Date of Birth: [birth year]     PHYSICAL THERAPY DISCHARGE SUMMARY  Visits from Start of  Care: 6  Current functional level related to goals / functional outcomes: See goals   Remaining deficits: Continued LUE pain and limited AROM/ Strength.    Education / Equipment: HEP, theraband, posture, liftin mechanics  Plan: Patient agrees to discharge.  Patient goals were partially met. Patient is being discharged due to the patient's request.  ?????         Kristoffer Leamon PT, DPT, LAT, ATC  10/28/19  1:24 PM

## 2019-10-29 ENCOUNTER — Encounter: Payer: Medicaid Other | Admitting: Physical Therapy

## 2019-10-31 ENCOUNTER — Encounter: Payer: Medicaid Other | Admitting: Physical Therapy

## 2019-10-31 NOTE — Telephone Encounter (Signed)
Patient made aware of recent lab results. No questions or concerns voiced. Amanda Christensen

## 2019-11-05 ENCOUNTER — Encounter: Payer: Medicaid Other | Admitting: Physical Therapy

## 2019-11-05 NOTE — Telephone Encounter (Signed)
Records in cabinet.

## 2019-11-07 ENCOUNTER — Encounter: Payer: Medicaid Other | Admitting: Physical Therapy

## 2019-11-12 ENCOUNTER — Encounter: Payer: Medicaid Other | Admitting: Physical Therapy

## 2020-04-29 DIAGNOSIS — K635 Polyp of colon: Secondary | ICD-10-CM | POA: Insufficient documentation

## 2020-04-29 DIAGNOSIS — F419 Anxiety disorder, unspecified: Secondary | ICD-10-CM | POA: Insufficient documentation

## 2020-04-29 DIAGNOSIS — J309 Allergic rhinitis, unspecified: Secondary | ICD-10-CM | POA: Insufficient documentation

## 2020-04-29 DIAGNOSIS — M545 Low back pain, unspecified: Secondary | ICD-10-CM | POA: Insufficient documentation

## 2020-04-30 DIAGNOSIS — R7303 Prediabetes: Secondary | ICD-10-CM | POA: Insufficient documentation

## 2020-04-30 DIAGNOSIS — R748 Abnormal levels of other serum enzymes: Secondary | ICD-10-CM | POA: Insufficient documentation

## 2020-06-16 DIAGNOSIS — R590 Localized enlarged lymph nodes: Secondary | ICD-10-CM | POA: Insufficient documentation

## 2020-09-21 LAB — HM COLONOSCOPY

## 2021-02-23 DIAGNOSIS — Q048 Other specified congenital malformations of brain: Secondary | ICD-10-CM | POA: Insufficient documentation

## 2021-03-23 DIAGNOSIS — I471 Supraventricular tachycardia, unspecified: Secondary | ICD-10-CM | POA: Insufficient documentation

## 2021-10-19 ENCOUNTER — Other Ambulatory Visit: Payer: Self-pay | Admitting: Nurse Practitioner

## 2021-10-19 DIAGNOSIS — Z1231 Encounter for screening mammogram for malignant neoplasm of breast: Secondary | ICD-10-CM

## 2021-11-25 ENCOUNTER — Ambulatory Visit
Admission: RE | Admit: 2021-11-25 | Discharge: 2021-11-25 | Disposition: A | Discharge status: Home / Self Care | Payer: Medicaid Other | Source: Ambulatory Visit | Attending: Nurse Practitioner | Admitting: Nurse Practitioner

## 2021-11-25 DIAGNOSIS — Z1231 Encounter for screening mammogram for malignant neoplasm of breast: Secondary | ICD-10-CM

## 2022-12-05 ENCOUNTER — Other Ambulatory Visit: Payer: Self-pay | Admitting: Nurse Practitioner

## 2022-12-05 DIAGNOSIS — Z1231 Encounter for screening mammogram for malignant neoplasm of breast: Secondary | ICD-10-CM

## 2022-12-07 ENCOUNTER — Ambulatory Visit
Admission: RE | Admit: 2022-12-07 | Discharge: 2022-12-07 | Disposition: A | Discharge status: Home / Self Care | Payer: Medicaid Other | Source: Ambulatory Visit | Attending: Nurse Practitioner | Admitting: Nurse Practitioner

## 2022-12-07 DIAGNOSIS — Z1231 Encounter for screening mammogram for malignant neoplasm of breast: Secondary | ICD-10-CM

## 2023-01-11 ENCOUNTER — Encounter (HOSPITAL_COMMUNITY): Payer: Self-pay

## 2023-01-11 ENCOUNTER — Ambulatory Visit (HOSPITAL_COMMUNITY)
Admission: EM | Admit: 2023-01-11 | Discharge: 2023-01-11 | Disposition: A | Discharge status: Home / Self Care | Payer: Medicaid Other | Attending: Internal Medicine | Admitting: Internal Medicine

## 2023-01-11 DIAGNOSIS — R1012 Left upper quadrant pain: Secondary | ICD-10-CM

## 2023-01-11 MED ORDER — KETOROLAC TROMETHAMINE 30 MG/ML IJ SOLN
30.0000 mg | Freq: Once | INTRAMUSCULAR | Status: AC
  Administered 2023-01-11: 30 mg via INTRAMUSCULAR

## 2023-01-11 MED ORDER — IBUPROFEN 600 MG PO TABS: 600.0000 mg | ORAL_TABLET | Freq: Four times a day (QID) | ORAL | 0 refills | Status: DC | PRN

## 2023-01-11 MED ORDER — KETOROLAC TROMETHAMINE 30 MG/ML IJ SOLN
INTRAMUSCULAR | Status: AC
  Filled 2023-01-11: qty 1

## 2023-01-11 NOTE — ED Triage Notes (Signed)
Pt c/o generalized abdominal pain since last night. States sharp pains to LLQ. States pain worse sitting or walking. States took tylenol with no relief. Last NBM today. Denies N/V/D.

## 2023-01-11 NOTE — Discharge Instructions (Addendum)
Heating pad use only 20 minutes on-20 minutes off cycle Please take medications as prescribed If you do have persistent/worsening abdominal pain, persistent vomiting or nausea-please go to the ED for further evaluation.

## 2023-01-12 NOTE — ED Provider Notes (Addendum)
[place]    CSN: [id number] Arrival date & time: 01/11/23  [place]      History   Chief Complaint Chief Complaint  Patient presents with   Abdominal Pain    HPI Amanda Christensen is a 62 y.o. female comes to the urgent care with complaints of left upper quadrant pain which started last night.  Patient describes the pain as sharp and of moderate severity.  It is nonradiating.  She denies any falls or trauma to the abdomen.  No heavy lifting.  Pain is aggravated by trying to sit from a laying position or when patient is walking.  No nausea, vomiting or diarrhea.  Last bowel movement was today and it is normal.  No weight changes.  No dysuria urgency or frequency.  No flank pain.  No fever or chills.  HPI  Past Medical History:  Diagnosis Date   Arthritis    Asthma    Chronic pain    COPD (chronic obstructive pulmonary disease) (HCC)    Depression    history of    DVT (deep venous thrombosis) (HCC)    h/o 4 blood clots after knee replacement RLE   GERD (gastroesophageal reflux disease)    Heart murmur    High cholesterol    History of anxiety    "used to have anxiety attacks"   History of bronchitis    Hypertension    Insomnia    takes Trazodone nightly   Obesity    Peripheral vascular disease ([place]) 10   rt leg dvt    Seizures ([place]) 03   was on medications none now off  since 2011   Shortness of breath dyspnea     Patient Active Problem List   Diagnosis Date Noted   Medication monitoring encounter 09/11/2019   S/P reverse total shoulder arthroplasty, 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   Total knee replacement status, left 09/27/2016   S/P shoulder replacement 01/08/2015   Rhabdomyolysis 08/02/2014   Arthritis 08/02/2014   Hyperlipidemia 08/02/2014   Asthma 08/02/2014   COPD (chronic obstructive pulmonary disease) ([place]) 03/25/2014   Hypertension 03/25/2014   Chest tightness or pressure 03/25/2014   Supratherapeutic INR  03/25/2014   Osteoarthritis 03/25/2014   History of DVT (deep vein thrombosis) 03/25/2014    Past Surgical History:  Procedure Laterality Date   BREAST EXCISIONAL BIOPSY Left 09/12/2016   BREAST LUMPECTOMY WITH NEEDLE LOCALIZATION Left 09/13/2016   Procedure: LEFT BREAST LUMPECTOMY WITH NEEDLE LOCALIZATION;  Surgeon: Clovis Riley, MD;  Location: [facility];  Service: General;  Laterality: Left;   CESAREAN SECTION     COLONOSCOPY     DILATION AND CURETTAGE OF UTERUS     HERNIA REPAIR     umbilical   JOINT REPLACEMENT     miscarriage     REVISION TOTAL SHOULDER TO REVERSE TOTAL SHOULDER Left 08/27/2019   Procedure: REVISION TOTAL SHOULDER TO REVERSE TOTAL SHOULDER;  Surgeon: Hiram Gash, MD;  Location: [facility];  Service: Orthopedics;  Laterality: Left;   ROTATOR CUFF REPAIR Left    SHOULDER ARTHROSCOPY Left 05/15/2019   Procedure: SHOULDER ARTHROSCOPY WITH MANIPULATION AND LYSIS OF ADHESIONS;  Surgeon: Hiram Gash, MD;  Location: [facility];  Service: Orthopedics;  Laterality: Left;   SYNOVIAL BIOPSY Left 05/15/2019   Procedure: SYNOVIAL BIOPSY;  Surgeon: Hiram Gash, MD;  Location: [facility];  Service: Orthopedics;  Laterality: Left;   TOTAL KNEE ARTHROPLASTY Right  TOTAL KNEE ARTHROPLASTY Left 09/27/2016   Procedure: LEFT TOTAL KNEE ARTHROPLASTY;  Surgeon: [name], MD;  Location: [facility];  Service: Orthopedics;  Laterality: Left;   TOTAL SHOULDER ARTHROPLASTY Left 01/08/2015   dr Veverly Fells   TOTAL SHOULDER ARTHROPLASTY Left 01/08/2015   Procedure: LEFT TOTAL SHOULDER ARTHROPLASTY;  Surgeon: Augustin Schooling, MD;  Location: [facility];  Service: Orthopedics;  Laterality: Left;   TUBAL LIGATION      OB History   No obstetric history on file.      Home Medications    Prior to Admission medications   Medication Sig Start Date End Date Taking? Authorizing Provider  ibuprofen (ADVIL) 600 MG tablet Take 1 tablet (600 mg total) by mouth every 6 (six)  hours as needed. 01/11/23  Yes Erum Cercone, Myrene Galas, MD  albuterol (PROVENTIL HFA;VENTOLIN HFA) 108 (90 BASE) MCG/ACT inhaler Inhale 1-2 puffs into the lungs every 6 (six) hours as needed for wheezing or shortness of breath. 12/15/14   Quintella Reichert, MD  albuterol (PROVENTIL) (2.5 MG/3ML) 0.083% nebulizer solution Inhale 2.5 mg into the lungs every 6 (six) hours as needed for wheezing or shortness of breath.  05/15/19   [provider]  Ascorbic Acid (VITAMIN C PO) Take 1 tablet by mouth at bedtime.     [provider]  diphenhydrAMINE (BENADRYL) 25 mg capsule Take 1 capsule (25 mg total) by mouth every 6 (six) hours as needed for itching. 08/28/19   Thayer Headings, MD  Fluticasone-Salmeterol (ADVAIR) 250-50 MCG/DOSE AEPB Inhale 1 puff into the lungs daily.     [provider]  traZODone (DESYREL) 100 MG tablet Take 100 mg by mouth at bedtime.    [provider]  triamcinolone cream (KENALOG) 0.1 % Apply 1 application topically 2 (two) times daily.    [provider]  triamterene-hydrochlorothiazide (MAXZIDE-25) 37.5-25 MG tablet Take 1 tablet by mouth daily.    [provider]    Family History Family History  Problem Relation Age of Onset   Cancer Father    Breast cancer Sister    Hypertension Other        family history   Cancer Other        family history   Drug abuse Other        family history   Other Other        family history of psychiatric problems, disabilities    Social History Social History   Tobacco Use   Smoking status: Former    Packs/day: 0.50    Years: 17.00    Total pack years: 8.50    Types: Cigarettes    Quit date: 11/20/2012    Years since quitting: 10.1   Smokeless tobacco: Former  Scientific laboratory technician Use: Never used  Substance Use Topics   Alcohol use: No   Drug use: Not Currently     Allergies   Patient has no active allergies.   Review of Systems Review of Systems  Gastrointestinal:   Positive for abdominal pain. Negative for abdominal distention, diarrhea, nausea and vomiting.  Genitourinary: Negative.   Musculoskeletal: Negative.      Physical Exam Triage Vital Signs ED Triage Vitals  Enc Vitals Group     BP 01/11/23 1931 (!) 178/85     Pulse Rate 01/11/23 1931 66     Resp 01/11/23 1931 18     Temp 01/11/23 1931 98.1 F (36.7 C)     Temp Source 01/11/23 1931 Oral  SpO2 01/11/23 1931 97 %     Weight --      Height --      Head Circumference --      Peak Flow --      Pain Score 01/11/23 1932 8     Pain Loc --      Pain Edu? --      Excl. in [place]? --    No data found.  Updated Vital Signs BP (!) 178/85 (BP Location: Right Arm)   Pulse 66   Temp 98.1 F (36.7 C) (Oral)   Resp 18   SpO2 97%   Visual Acuity Right Eye Distance:   Left Eye Distance:   Bilateral Distance:    Right Eye Near:   Left Eye Near:    Bilateral Near:     Physical Exam Vitals and nursing note reviewed.  Constitutional:      General: She is not in acute distress.    Appearance: She is not ill-appearing.  Abdominal:     General: Abdomen is protuberant. Bowel sounds are normal. There is no distension or abdominal bruit.     Palpations: There is no shifting dullness, fluid wave, hepatomegaly or splenomegaly.     Tenderness: There is abdominal tenderness in the left upper quadrant. There is no guarding or rebound.     Hernia: No hernia is present.  Neurological:     Mental Status: She is alert.      UC Treatments / Results  Labs (all labs ordered are listed, but only abnormal results are displayed) Labs Reviewed - No data to display  EKG   Radiology No results found.  Procedures Procedures (including critical care time)  Medications Ordered in UC Medications  ketorolac (TORADOL) 30 MG/ML injection 30 mg (30 mg Intramuscular Given 01/11/23 2030)    Initial Impression / Assessment and Plan / UC Course  I have reviewed the triage vital signs and the nursing  notes.  Pertinent labs & imaging results that were available during my care of the patient were reviewed by me and considered in my medical decision making (see chart for details).     1.  Abdominal wall pain in the left upper quadrant: Ibuprofen as needed for pain Toradol 30 mg IM x 1 dose. Heating pad use only 20 minutes on-20 minutes of cycle Maintain adequate hydration Patient advised to go to ED if symptoms persist or worsens. Final Clinical Impressions(s) / UC Diagnoses   Final diagnoses:  Abdominal wall pain in left upper quadrant     Discharge Instructions      Heating pad use only 20 minutes on-20 minutes off cycle Please take medications as prescribed If you do have persistent/worsening abdominal pain, persistent vomiting or nausea-please go to the ED for further evaluation.   ED Prescriptions     Medication Sig Dispense Auth. Provider   ibuprofen (ADVIL) 600 MG tablet Take 1 tablet (600 mg total) by mouth every 6 (six) hours as needed. 30 tablet Keyetta Hollingworth, Myrene Galas, MD      PDMP not reviewed this encounter.   Chase Picket, MD 01/12/23 1220    Chase Picket, MD 01/12/23 [phone]

## 2023-01-15 ENCOUNTER — Emergency Department (HOSPITAL_COMMUNITY)
Admission: EM | Admit: 2023-01-15 | Discharge: 2023-01-15 | Disposition: A | Discharge status: Home / Self Care | Payer: Medicaid Other | Attending: Emergency Medicine | Admitting: Emergency Medicine

## 2023-01-15 ENCOUNTER — Emergency Department (HOSPITAL_COMMUNITY): Payer: Medicaid Other

## 2023-01-15 ENCOUNTER — Other Ambulatory Visit: Payer: Self-pay

## 2023-01-15 ENCOUNTER — Encounter (HOSPITAL_COMMUNITY): Payer: Self-pay | Admitting: Emergency Medicine

## 2023-01-15 DIAGNOSIS — K5792 Diverticulitis of intestine, part unspecified, without perforation or abscess without bleeding: Secondary | ICD-10-CM

## 2023-01-15 DIAGNOSIS — Z7951 Long term (current) use of inhaled steroids: Secondary | ICD-10-CM | POA: Insufficient documentation

## 2023-01-15 DIAGNOSIS — R109 Unspecified abdominal pain: Secondary | ICD-10-CM

## 2023-01-15 DIAGNOSIS — Z79899 Other long term (current) drug therapy: Secondary | ICD-10-CM | POA: Diagnosis not present

## 2023-01-15 DIAGNOSIS — J449 Chronic obstructive pulmonary disease, unspecified: Secondary | ICD-10-CM | POA: Diagnosis not present

## 2023-01-15 DIAGNOSIS — K5732 Diverticulitis of large intestine without perforation or abscess without bleeding: Secondary | ICD-10-CM | POA: Diagnosis not present

## 2023-01-15 DIAGNOSIS — I10 Essential (primary) hypertension: Secondary | ICD-10-CM | POA: Diagnosis not present

## 2023-01-15 DIAGNOSIS — R1032 Left lower quadrant pain: Secondary | ICD-10-CM | POA: Diagnosis present

## 2023-01-15 LAB — COMPREHENSIVE METABOLIC PANEL
ALT: 17 U/L (ref 0–44)
AST: 20 U/L (ref 15–41)
Albumin: 3.8 g/dL (ref 3.5–5.0)
Alkaline Phosphatase: 92 U/L (ref 38–126)
Anion gap: 5 (ref 5–15)
BUN: 12 mg/dL (ref 8–23)
CO2: 26 mmol/L (ref 22–32)
Calcium: 8.9 mg/dL (ref 8.9–10.3)
Chloride: 106 mmol/L (ref 98–111)
Creatinine, Ser: 0.71 mg/dL (ref 0.44–1.00)
GFR, Estimated: >60 mL/min (ref >60)
Glucose, Bld: 143 mg/dL — ABNORMAL HIGH (ref 70–99)
Potassium: 3.8 mmol/L (ref 3.5–5.1)
Sodium: 137 mmol/L (ref 135–145)
Total Bilirubin: 0.9 mg/dL (ref 0.3–1.2)
Total Protein: 7.7 g/dL (ref 6.5–8.1)

## 2023-01-15 LAB — CBC
HCT: 43.4 % (ref 36.0–46.0)
Hemoglobin: 13.3 g/dL (ref 12.0–15.0)
MCH: 27.7 pg (ref 26.0–34.0)
MCHC: 30.6 g/dL (ref 30.0–36.0)
MCV: 90.4 fL (ref 80.0–100.0)
Platelets: 185 10*3/uL (ref 150–400)
RBC: 4.8 MIL/uL (ref 3.87–5.11)
RDW: 12.3 % (ref 11.5–15.5)
WBC: 4.8 10*3/uL (ref 4.0–10.5)
nRBC: 0 % (ref 0.0–0.2)

## 2023-01-15 LAB — URINALYSIS, ROUTINE W REFLEX MICROSCOPIC
Bacteria, UA: NONE SEEN
Bilirubin Urine: NEGATIVE
Glucose, UA: NEGATIVE mg/dL
Hgb urine dipstick: NEGATIVE
Ketones, ur: NEGATIVE mg/dL
Nitrite: NEGATIVE
Protein, ur: NEGATIVE mg/dL
Specific Gravity, Urine: 1.02 (ref 1.005–1.030)
pH: 5 (ref 5.0–8.0)

## 2023-01-15 LAB — LIPASE, BLOOD: Lipase: 37 U/L (ref 11–51)

## 2023-01-15 MED ORDER — CIPROFLOXACIN HCL 500 MG PO TABS: 500.0000 mg | ORAL_TABLET | Freq: Two times a day (BID) | ORAL | 0 refills | Status: DC

## 2023-01-15 MED ORDER — ONDANSETRON 4 MG PO TBDP: 4.0000 mg | ORAL_TABLET | Freq: Three times a day (TID) | ORAL | 0 refills | Status: DC | PRN

## 2023-01-15 MED ORDER — KETOROLAC TROMETHAMINE 30 MG/ML IJ SOLN
15.0000 mg | Freq: Once | INTRAMUSCULAR | Status: AC
  Administered 2023-01-15: 15 mg via INTRAVENOUS
  Filled 2023-01-15: qty 1

## 2023-01-15 MED ORDER — METRONIDAZOLE 500 MG PO TABS
500.0000 mg | ORAL_TABLET | Freq: Once | ORAL | Status: AC
  Administered 2023-01-15: 500 mg via ORAL
  Filled 2023-01-15: qty 1

## 2023-01-15 MED ORDER — METRONIDAZOLE 500 MG PO TABS: 500.0000 mg | ORAL_TABLET | Freq: Three times a day (TID) | ORAL | 0 refills | Status: AC

## 2023-01-15 MED ORDER — IOHEXOL 300 MG/ML  SOLN
100.0000 mL | Freq: Once | INTRAMUSCULAR | Status: AC | PRN
  Administered 2023-01-15: 100 mL via INTRAVENOUS

## 2023-01-15 MED ORDER — CIPROFLOXACIN HCL 500 MG PO TABS
500.0000 mg | ORAL_TABLET | Freq: Once | ORAL | Status: AC
Start: 2023-01-15 — End: 2023-01-15
  Administered 2023-01-15: 500 mg via ORAL
  Filled 2023-01-15: qty 1

## 2023-01-15 MED ORDER — HYDROCODONE-ACETAMINOPHEN 5-325 MG PO TABS: 2.0000 | ORAL_TABLET | Freq: Four times a day (QID) | ORAL | 0 refills | Status: DC | PRN

## 2023-01-15 MED ORDER — HYDROCODONE-ACETAMINOPHEN 5-325 MG PO TABS
1.0000 | ORAL_TABLET | Freq: Once | ORAL | Status: AC
  Administered 2023-01-15: 1 via ORAL
  Filled 2023-01-15: qty 1

## 2023-01-15 NOTE — ED Triage Notes (Signed)
Patient arrives ambulatory by POV c/o left lower abdominal pain x 4 days. Patient went to UC on 22nd and given pain shot. Took ibuprofen PTA and pain has improved. Patient eating [employer] and drinking soda in triage.

## 2023-01-15 NOTE — ED Provider Triage Note (Signed)
Emergency Medicine Provider Triage Evaluation Note  Amanda Christensen , a 62 y.o. female  was evaluated in triage.  Pt complains of LLQ abdominal pain since Thursday.  Patient was [facility] at [facility] for it and given a "pain shot" and ibuprofen.  She states this helped her pain.  She stopped taking ibuprofen last night, woke around 0100 today with sharp pains.  Denies dysuria, vaginal bleeding, nausea, vomiting, diarrhea, constipation, fever.   Review of Systems  Positive: As above Negative: As above  Physical Exam  BP (!) 146/101 (BP Location: Right Arm)   Pulse 65   Temp 98.4 F (36.9 C) (Oral)   Resp 18   Ht 5' (1.524 m)   Wt 89.8 kg   SpO2 98%   BMI 38.67 kg/m  Gen:   Awake, no distress   Resp:  Normal effort  MSK:   Moves extremities without difficulty  Other:    Medical Decision Making  Medically screening exam initiated at 3:11 PM.  Appropriate orders placed.  Amanda Christensen was informed that the remainder of the evaluation will be completed by another provider, this initial triage assessment does not replace that evaluation, and the importance of remaining in the ED until their evaluation is complete.     Theressa Stamps R, [place] 01/15/23 [phone]

## 2023-01-15 NOTE — ED Provider Notes (Signed)
[facility] Provider Note   CSN: [id number] Arrival date & time: 01/15/23  1431     History  Chief Complaint  Patient presents with   Abdominal Pain    Amanda Christensen is a 62 y.o. female.  With PMH of HTN, HLD, COPD, obesity, provoked DVT history, seizures who presents with left lower quadrant abdominal pain over the past 4 days.  Patient notes progressive pain and pressure in her left lower quadrant.  She denies any recent injuries.  She has had no fevers, no nausea, no vomiting, no urinary symptoms.  She has had normal bowel movements.  She has previously been to GI doctor in the past and has had previous colonoscopies in total she has had polyps.  She got Toradol at the urgent care symptoms and had some improvement but noting some still present pressure and discomfort in that region.   Abdominal Pain      Home Medications Prior to Admission medications   Medication Sig Start Date End Date Taking? Authorizing Provider  ciprofloxacin (CIPRO) 500 MG tablet Take 1 tablet (500 mg total) by mouth every 12 (twelve) hours. 01/15/23  Yes Elgie Congo, MD  EYSUVIS 0.25 % SUSP Apply 1 drop to eye 4 (four) times daily. 12/11/22  Yes [provider]  HYDROcodone-acetaminophen (NORCO/VICODIN) 5-325 MG tablet Take 2 tablets by mouth every 6 (six) hours as needed for up to 10 doses for severe pain. 01/15/23  Yes Elgie Congo, MD  metroNIDAZOLE (FLAGYL) 500 MG tablet Take 1 tablet (500 mg total) by mouth 3 (three) times daily for 5 days. 01/15/23 01/20/23 Yes Elgie Congo, MD  ondansetron (ZOFRAN-ODT) 4 MG disintegrating tablet Take 1 tablet (4 mg total) by mouth every 8 (eight) hours as needed for nausea or vomiting. 01/15/23  Yes Elgie Congo, MD  albuterol (PROVENTIL HFA;VENTOLIN HFA) 108 (90 BASE) MCG/ACT inhaler Inhale 1-2 puffs into the lungs every 6 (six) hours as needed for wheezing or shortness of breath. 12/15/14    Quintella Reichert, MD  albuterol (PROVENTIL) (2.5 MG/3ML) 0.083% nebulizer solution Inhale 2.5 mg into the lungs every 6 (six) hours as needed for wheezing or shortness of breath.  05/15/19   [provider]  Ascorbic Acid (VITAMIN C PO) Take 1 tablet by mouth at bedtime.     [provider]  diphenhydrAMINE (BENADRYL) 25 mg capsule Take 1 capsule (25 mg total) by mouth every 6 (six) hours as needed for itching. 08/28/19   Thayer Headings, MD  Fluticasone-Salmeterol (ADVAIR) 250-50 MCG/DOSE AEPB Inhale 1 puff into the lungs daily.     [provider]  ibuprofen (ADVIL) 600 MG tablet Take 1 tablet (600 mg total) by mouth every 6 (six) hours as needed. 01/11/23   Chase Picket, MD  traZODone (DESYREL) 100 MG tablet Take 100 mg by mouth at bedtime.    [provider]  triamcinolone cream (KENALOG) 0.1 % Apply 1 application topically 2 (two) times daily.    [provider]  triamterene-hydrochlorothiazide (MAXZIDE-25) 37.5-25 MG tablet Take 1 tablet by mouth daily.    [provider]      Allergies    Patient has no active allergies.    Review of Systems   Review of Systems  Gastrointestinal:  Positive for abdominal pain.    Physical Exam Updated Vital Signs BP (!) 146/78   Pulse 73   Temp 97.8 F (36.6 C) (Oral)   Resp 18  Ht 5' (1.524 m)   Wt 89.8 kg   SpO2 99%   BMI 38.67 kg/m  Physical Exam Constitutional: Alert and oriented. Well appearing and in no distress. Eyes: Conjunctivae are normal. ENT      Head: Normocephalic and atraumatic.      Nose: No congestion.      Mouth/Throat: Mucous membranes are moist.      Neck: No stridor. Cardiovascular: S1, S2, regular rate Respiratory: Normal respiratory effort.  O2 sat 99 on RA Gastrointestinal: Soft and nondistended with left lower quadrant tenderness to palpation no rebound or guarding Musculoskeletal: Normal range of motion in all extremities. Neurologic: Normal speech and  language. No gross focal neurologic deficits are appreciated. Skin: Skin is warm, dry and intact. No rash noted. Psychiatric: Mood and affect are normal. Speech and behavior are normal.  ED Results / Procedures / Treatments   Labs (all labs ordered are listed, but only abnormal results are displayed) Labs Reviewed  COMPREHENSIVE METABOLIC PANEL - Abnormal; Notable for the following components:      Result Value   Glucose, Bld 143 (*)    All other components within normal limits  URINALYSIS, ROUTINE W REFLEX MICROSCOPIC - Abnormal; Notable for the following components:   Leukocytes,Ua TRACE (*)    All other components within normal limits  LIPASE, BLOOD  CBC    EKG None  Radiology CT ABDOMEN PELVIS W CONTRAST  Result Date: 01/15/2023 CLINICAL DATA:  Left lower quadrant pain EXAM: CT ABDOMEN AND PELVIS WITH CONTRAST TECHNIQUE: Multidetector CT imaging of the abdomen and pelvis was performed using the standard protocol following bolus administration of intravenous contrast. RADIATION DOSE REDUCTION: This exam was performed according to the departmental dose-optimization program which includes automated exposure control, adjustment of the mA and/or kV according to patient size and/or use of iterative reconstruction technique. CONTRAST:  134m OMNIPAQUE IOHEXOL 300 MG/ML  SOLN COMPARISON:  None Available. FINDINGS: Lower chest: No acute abnormality. Hepatobiliary: No focal liver abnormality is seen. No gallstones, gallbladder wall thickening, or biliary dilatation. Pancreas: Unremarkable. No pancreatic ductal dilatation or surrounding inflammatory changes. Spleen: Normal in size without focal abnormality. Adrenals/Urinary Tract: Adrenal glands are unremarkable. Kidneys are normal, without renal calculi, focal lesion, or hydronephrosis. Bladder is unremarkable. Stomach/Bowel: There is sigmoid colon diverticulosis. There is inflammatory stranding and small amount fluid surrounding a diverticulum in  the proximal sigmoid colon compatible with acute diverticulitis. There is no evidence for perforation or abscess. There is no bowel obstruction or free air. The appendix, small bowel and stomach are within normal limits. Vascular/Lymphatic: Aortic atherosclerosis. No enlarged abdominal or pelvic lymph nodes. Reproductive: Uterus and bilateral adnexa are unremarkable. Other: No abdominal wall hernia or abnormality. No abdominopelvic ascites. Musculoskeletal: No acute or significant osseous findings. IMPRESSION: 1. Acute uncomplicated sigmoid colon diverticulitis. Aortic Atherosclerosis (ICD10-I70.0). Electronically Signed   By: ARonney AstersM.D.   On: 01/15/2023 22:44    Procedures Procedures  Remain on constant cardiac monitoring sinus rhythm with normal rates.  Medications Ordered in ED Medications  ketorolac (TORADOL) 30 MG/ML injection 15 mg (15 mg Intravenous Given 01/15/23 2156)  iohexol (OMNIPAQUE) 300 MG/ML solution 100 mL (100 mLs Intravenous Contrast Given 01/15/23 2214)  ciprofloxacin (CIPRO) tablet 500 mg (500 mg Oral Given 01/15/23 2312)  metroNIDAZOLE (FLAGYL) tablet 500 mg (500 mg Oral Given 01/15/23 2312)  HYDROcodone-acetaminophen (NORCO/VICODIN) 5-325 MG per tablet 1 tablet (1 tablet Oral Given 01/15/23 2312)    ED Course/ Medical Decision Making/ A&P  Medical Decision Making TRANISE TUAZON is a 62 y.o. female.  With PMH of HTN, HLD, COPD, obesity, provoked DVT history, seizures who presents with left lower quadrant abdominal pain over the past 4 days.   Based on the patient's left lower quadrant pain, differential includes but is not limited to UTI,  SBO, diverticulitis, colitis. Less likely nephrolithiasis or pyelonephritis with no CVAT and no urinary symptoms.   Labs reviewed by me.  Reassuring.  Normal white blood cell count 4.8.  Normal creatinine 0.71.  No transaminitis and normal lipase 37.  UA without evidence of UTI, only 6-10 WBCs and trace  leukocyte esterase without urinary symptoms no back.  No nitrite.  CTAP with IV contrast obtained reviewed by me no evidence of free fluid.  Read by radiology as acute uncomplicated sigmoid diverticulitis.  Patient reassessed having pain controlled no nausea or vomiting she is very well-appearing.  She has no clinical signs concerning for sepsis.  She is tolerating p.o. without any vomiting.  Will discharge patient with 5-day course of ciprofloxacin and Flagyl as well as as needed Norco and Zofran for symptom control.  Provided referral to GI for outpatient follow-up.  Discussed return precautions.  She is in agreement with plan safer discharge at this time.   Amount and/or Complexity of Data Reviewed Radiology: ordered.  Risk Prescription drug management.    Final Clinical Impression(s) / ED Diagnoses Final diagnoses:  Abdominal pain, unspecified abdominal location  Diverticulitis    Rx / DC Orders ED Discharge Orders          Ordered    ciprofloxacin (CIPRO) 500 MG tablet  Every 12 hours        01/15/23 2311    metroNIDAZOLE (FLAGYL) 500 MG tablet  3 times daily        01/15/23 2311    ondansetron (ZOFRAN-ODT) 4 MG disintegrating tablet  Every 8 hours PRN        01/15/23 2311    HYDROcodone-acetaminophen (NORCO/VICODIN) 5-325 MG tablet  Every 6 hours PRN        01/15/23 2311    Ambulatory referral to Gastroenterology        01/15/23 [place], [place], MD 01/15/23 2316

## 2023-01-15 NOTE — Discharge Instructions (Signed)
Your CT scan showed evidence of diverticulitis.  Follow the instructions listed in your discharge paperwork.  You have been prescribed 2 different antibiotics ciprofloxacin and Flagyl.  Please take these as prescribed and do not miss any doses.  You can take Tylenol and ibuprofen as needed for pain.  If pain is still severe you can take the Norco as needed.  Do not take while drinking alcohol or driving a car.  You can take the Zofran as needed for nausea or vomiting.  Make an appointment with gastroenterology in outpatient setting regarding your visit to the ER today for follow-up regarding diverticulitis.  Come back if any severe worsening pain, uncontrollable nausea vomiting, or any other symptoms concerning to you.

## 2023-01-19 ENCOUNTER — Encounter: Payer: Self-pay | Admitting: Nurse Practitioner

## 2023-01-19 ENCOUNTER — Ambulatory Visit (INDEPENDENT_AMBULATORY_CARE_PROVIDER_SITE_OTHER): Payer: Medicaid Other | Admitting: Nurse Practitioner

## 2023-01-19 VITALS — BP 130/78 | HR 55 | Ht 60.0 in | Wt 202.0 lb

## 2023-01-19 DIAGNOSIS — K5792 Diverticulitis of intestine, part unspecified, without perforation or abscess without bleeding: Secondary | ICD-10-CM | POA: Diagnosis not present

## 2023-01-19 MED ORDER — SULFAMETHOXAZOLE-TRIMETHOPRIM 800-160 MG PO TABS: 1.0000 | ORAL_TABLET | Freq: Two times a day (BID) | ORAL | 0 refills | Status: DC

## 2023-01-19 NOTE — Patient Instructions (Addendum)
You are schedule for follow up visit on 02/26/23 at 9 am  We have sent the following medications to your pharmacy for you to pick up at your convenience: Bactrim DS 1 tablet twice daily x 7 days  We will contact Dr Benson [place] office for your colonoscopy records   If your blood pressure at your visit was 140/90 or greater, please contact your primary care physician to follow up on this.  _______________________________________________________  If you are age 82 or older, your body mass index should be between 23-30. Your Body mass index is 39.45 kg/m. If this is out of the aforementioned range listed, please consider follow up with your Primary Care Provider.  If you are age 24 or younger, your body mass index should be between 19-25. Your Body mass index is 39.45 kg/m. If this is out of the aformentioned range listed, please consider follow up with your Primary Care Provider.   ________________________________________________________  The [facility] providers would like to encourage you to use [facility] to communicate with providers for non-urgent requests or questions.  Due to long hold times on the telephone, sending your provider a message by [facility] may be a faster and more efficient way to get a response.  Please allow 48 business hours for a response.  Please remember that this is for non-urgent requests.   Thank you for entrusting me with your care and choosing [facility].  Tye Savoy NP

## 2023-01-19 NOTE — Addendum Note (Signed)
Addended by: Schuyler Amor on: 01/19/2023 03:07 PM   Modules accepted: Orders

## 2023-01-19 NOTE — Progress Notes (Addendum)
Assessment:   # 62 yo female with acute, uncomplicated sigmoid diverticulitis on CT scan 2/26 She stopped flagyl /  cipro after a couple of days due to dizziness but was also taking pain meds at the time. Still having LLQ discomfort   Plan:  - Bactrim DS BID x 7 days - Follow up with me early April.  By then we should have colonoscopy reports ( from Dr. Collene Mares?). I suspect she is going to need a repeat colonscopy after resolution of diverticulitis.   Addendum.  Records received from Dr. Ulyses Amor office.   Screening colonoscopy Dec 2014 -The exam was complete.  The bowel prep was excellent.   -A 4 mm sessile polyp was removed from the rectosigmoid colon.  A few medium mouth diverticula were found in the sigmoid colon.  A diverticulum was found in the ascending colon .  External and internal hemorrhoids were found -Polyp was hyperplastic  Patient will return for follow-up in April.  At that time I will clarify her family history.  Her family history of colon cancer is not clear.  It is possible that her maternal aunt, maternal grandfather and maternal grandmother all had colon cancer?  Will decide on timing of next colonoscopy after family history is clarified   HPI:   Chief Complaint:  diverticulitis  Amanda Christensen is a 62 y.o. year old female with a past medical history of COPD, DVT, GERD, depression, hyperlipidemia, obesity, seizures.  See PMH / [place] for additional history.   Amanda Christensen is new to the practice.  She was seen at urgent care on 2/22 for evaluation of LUQ pain, told she probably had abdominal wall pain and was prescribed ibuprofen and given a dose of Toradol.  She then presented to the ED on 01/15/2023 for evaluation of left lower abdominal pain.  CT scan with contrast showed acute uncomplicated sigmoid diverticulitis.  She was prescribed Cipro and Flagyl.  Of note ED labs were unremarkable including a CBC and c-Met  She stopped the antibiotics because they made her dizzy.  However, she has also been taking pain medication with the antibiotics. Tells me pain meds haven't previously caused dizziness so she thinks it may be one of the antibiotics. She stopped the antibiotics last night and still having dizziness today.   She passed out in her 20's after a shot of PCN so lists it on her allergies.   Still having LLQ discomfort.  Had some diarrhea and vomiting in mid Feb which lasted a couple of days. Those symptoms have resolved . BMs back normal.   She had a colonoscopy  in [place] but doesn't know when. Says she did have polyps. She cannot remember when it was done ( can't say if it has been in the last 5 years. )   Previous Labs / Imaging::    Latest Ref Rng & Units 01/15/2023    3:24 PM 08/19/2019   10:41 AM 07/14/2019    3:25 PM  CBC  WBC 4.0 - 10.5 K/uL 4.8  5.1  7.1   Hemoglobin 12.0 - 15.0 g/dL 13.3  12.9  14.1   Hematocrit 36.0 - 46.0 % 43.4  41.8  42.5   Platelets 150 - 400 K/uL 185  228  226     Lab Results  Component Value Date   LIPASE 37 01/15/2023      Latest Ref Rng & Units 01/15/2023    3:24 PM 08/19/2019   10:41 AM 07/14/2019  3:25 PM  CMP  Glucose 70 - 99 mg/dL 143  105  104   BUN 8 - 23 mg/dL 12  12  17    Creatinine 0.44 - 1.00 mg/dL 0.71  0.90  1.01   Sodium 135 - 145 mmol/L 137  139  141   Potassium 3.5 - 5.1 mmol/L 3.8  4.1  3.9   Chloride 98 - 111 mmol/L 106  105  104   CO2 22 - 32 mmol/L 26  24  27    Calcium 8.9 - 10.3 mg/dL 8.9  9.4  9.9   Total Protein 6.5 - 8.1 g/dL 7.7     Total Bilirubin 0.3 - 1.2 mg/dL 0.9     Alkaline Phos 38 - 126 U/L 92     AST 15 - 41 U/L 20     ALT 0 - 44 U/L 17        Imaging:  CT ABDOMEN PELVIS W CONTRAST CLINICAL DATA:  Left lower quadrant pain  EXAM: CT ABDOMEN AND PELVIS WITH CONTRAST  TECHNIQUE: Multidetector CT imaging of the abdomen and pelvis was performed using the standard protocol following bolus administration of intravenous contrast.  RADIATION DOSE REDUCTION: This  exam was performed according to the departmental dose-optimization program which includes automated exposure control, adjustment of the mA and/or kV according to patient size and/or use of iterative reconstruction technique.  CONTRAST:  195mL OMNIPAQUE IOHEXOL 300 MG/ML  SOLN  COMPARISON:  None Available.  FINDINGS: Lower chest: No acute abnormality.  Hepatobiliary: No focal liver abnormality is seen. No gallstones, gallbladder wall thickening, or biliary dilatation.  Pancreas: Unremarkable. No pancreatic ductal dilatation or surrounding inflammatory changes.  Spleen: Normal in size without focal abnormality.  Adrenals/Urinary Tract: Adrenal glands are unremarkable. Kidneys are normal, without renal calculi, focal lesion, or hydronephrosis. Bladder is unremarkable.  Stomach/Bowel: There is sigmoid colon diverticulosis. There is inflammatory stranding and small amount fluid surrounding a diverticulum in the proximal sigmoid colon compatible with acute diverticulitis. There is no evidence for perforation or abscess. There is no bowel obstruction or free air. The appendix, small bowel and stomach are within normal limits.  Vascular/Lymphatic: Aortic atherosclerosis. No enlarged abdominal or pelvic lymph nodes.  Reproductive: Uterus and bilateral adnexa are unremarkable.  Other: No abdominal wall hernia or abnormality. No abdominopelvic ascites.  Musculoskeletal: No acute or significant osseous findings.  IMPRESSION: 1. Acute uncomplicated sigmoid colon diverticulitis.  Aortic Atherosclerosis (ICD10-I70.0).  Electronically Signed   By: Ronney Asters M.D.   On: 01/15/2023 22:44    Past Medical History:  Diagnosis Date   Arthritis    Asthma    Chronic pain    COPD (chronic obstructive pulmonary disease) (HCC)    Depression    history of    DVT (deep venous thrombosis) (HCC)    h/o 4 blood clots after knee replacement RLE   GERD (gastroesophageal reflux disease)     Heart murmur    High cholesterol    History of anxiety    "used to have anxiety attacks"   History of bronchitis    Hypertension    Insomnia    takes Trazodone nightly   Obesity    Peripheral vascular disease ([place]) 10   rt leg dvt    Seizures ([place]) 03   was on medications none now off  since 2011   Shortness of breath dyspnea    Past Surgical History:  Procedure Laterality Date   BREAST EXCISIONAL BIOPSY Left 09/12/2016   BREAST LUMPECTOMY  WITH NEEDLE LOCALIZATION Left 09/13/2016   Procedure: LEFT BREAST LUMPECTOMY WITH NEEDLE LOCALIZATION;  Surgeon: Clovis Riley, MD;  Location: [facility];  Service: General;  Laterality: Left;   CESAREAN SECTION     COLONOSCOPY     DILATION AND CURETTAGE OF UTERUS     HERNIA REPAIR     umbilical   JOINT REPLACEMENT     miscarriage     REVISION TOTAL SHOULDER TO REVERSE TOTAL SHOULDER Left 08/27/2019   Procedure: REVISION TOTAL SHOULDER TO REVERSE TOTAL SHOULDER;  Surgeon: Hiram Gash, MD;  Location: [facility];  Service: Orthopedics;  Laterality: Left;   ROTATOR CUFF REPAIR Left    SHOULDER ARTHROSCOPY Left 05/15/2019   Procedure: SHOULDER ARTHROSCOPY WITH MANIPULATION AND LYSIS OF ADHESIONS;  Surgeon: Hiram Gash, MD;  Location: [facility];  Service: Orthopedics;  Laterality: Left;   SYNOVIAL BIOPSY Left 05/15/2019   Procedure: SYNOVIAL BIOPSY;  Surgeon: Hiram Gash, MD;  Location: [facility];  Service: Orthopedics;  Laterality: Left;   TOTAL KNEE ARTHROPLASTY Right    TOTAL KNEE ARTHROPLASTY Left 09/27/2016   Procedure: LEFT TOTAL KNEE ARTHROPLASTY;  Surgeon: [name], MD;  Location: [facility];  Service: Orthopedics;  Laterality: Left;   TOTAL SHOULDER ARTHROPLASTY Left 01/08/2015   dr Veverly Fells   TOTAL SHOULDER ARTHROPLASTY Left 01/08/2015   Procedure: LEFT TOTAL SHOULDER ARTHROPLASTY;  Surgeon: Augustin Schooling, MD;  Location: [facility];  Service: Orthopedics;  Laterality: Left;   TUBAL LIGATION     Family  History  Problem Relation Age of Onset   Cancer Father    Breast cancer Sister    Hypertension Other        family history   Cancer Other        family history   Drug abuse Other        family history   Other Other        family history of psychiatric problems, disabilities   Social History   Tobacco Use   Smoking status: Former    Packs/day: 0.50    Years: 17.00    Total pack years: 8.50    Types: Cigarettes    Quit date: 11/20/2012    Years since quitting: 10.1   Smokeless tobacco: Former  Scientific laboratory technician Use: Never used  Substance Use Topics   Alcohol use: No   Drug use: Not Currently   Current Outpatient Medications  Medication Sig Dispense Refill   albuterol (PROVENTIL HFA;VENTOLIN HFA) 108 (90 BASE) MCG/ACT inhaler Inhale 1-2 puffs into the lungs every 6 (six) hours as needed for wheezing or shortness of breath. 1 Inhaler 0   albuterol (PROVENTIL) (2.5 MG/3ML) 0.083% nebulizer solution Inhale 2.5 mg into the lungs every 6 (six) hours as needed for wheezing or shortness of breath.      Ascorbic Acid (VITAMIN C PO) Take 1 tablet by mouth at bedtime.      ciprofloxacin (CIPRO) 500 MG tablet Take 1 tablet (500 mg total) by mouth every 12 (twelve) hours. 10 tablet 0   diphenhydrAMINE (BENADRYL) 25 mg capsule Take 1 capsule (25 mg total) by mouth every 6 (six) hours as needed for itching. 30 capsule 2   EYSUVIS 0.25 % SUSP Apply 1 drop to eye 4 (four) times daily.     Fluticasone-Salmeterol (ADVAIR) 250-50 MCG/DOSE AEPB Inhale 1 puff into the lungs daily.      HYDROcodone-acetaminophen (NORCO/VICODIN) 5-325 MG tablet Take 2 tablets by  mouth every 6 (six) hours as needed for up to 10 doses for severe pain. 10 tablet 0   ibuprofen (ADVIL) 600 MG tablet Take 1 tablet (600 mg total) by mouth every 6 (six) hours as needed. 30 tablet 0   metroNIDAZOLE (FLAGYL) 500 MG tablet Take 1 tablet (500 mg total) by mouth 3 (three) times daily for 5 days. 15 tablet 0   ondansetron  (ZOFRAN-ODT) 4 MG disintegrating tablet Take 1 tablet (4 mg total) by mouth every 8 (eight) hours as needed for nausea or vomiting. 20 tablet 0   sulfamethoxazole-trimethoprim (BACTRIM DS) 800-160 MG tablet Take 1 tablet by mouth 2 (two) times daily. 14 tablet 0   traZODone (DESYREL) 100 MG tablet Take 100 mg by mouth at bedtime.     triamcinolone cream (KENALOG) 0.1 % Apply 1 application topically 2 (two) times daily.     triamterene-hydrochlorothiazide (MAXZIDE-25) 37.5-25 MG tablet Take 1 tablet by mouth daily.     No current facility-administered medications for this visit.   No Active Allergies   Review of Systems: All systems reviewed and negative except where noted in HPI.   Wt Readings from Last 3 Encounters:  01/15/23 198 lb (89.8 kg)  10/15/19 222 lb (100.7 kg)  09/11/19 230 lb (104.3 kg)    Physical Exam:  BP 130/78   Pulse (!) 55   Ht 5' (1.524 m)   Wt 202 lb (91.6 kg)   SpO2 97%   BMI 39.45 kg/m  Constitutional:  Pleasant, generally well appearing female in no acute distress. Psychiatric:  Normal mood and affect. Behavior is normal. EENT: Pupils normal.  Conjunctivae are normal. No scleral icterus. Neck supple.  Cardiovascular: Normal rate, regular rhythm.  Pulmonary/chest: Effort normal and breath sounds normal. No wheezing, rales or rhonchi. Abdominal: Soft, nondistended, mild LLQ tender.bess Bowel sounds active throughout. There are no masses palpable. No hepatomegaly. Neurological: Alert and oriented to person place and time. Skin: Skin is warm and dry. No rashes noted.  Tye Savoy, NP  01/19/2023, 8:14 AM

## 2023-01-22 NOTE — Progress Notes (Signed)
Agree with the assessment and plan as outlined by Tye Savoy, NP.  Lurae Hornbrook E. Candis Schatz, MD [facility]

## 2023-02-26 ENCOUNTER — Ambulatory Visit: Payer: Medicaid Other | Admitting: Physician Assistant

## 2023-03-06 ENCOUNTER — Encounter: Payer: Self-pay | Admitting: Podiatry

## 2023-03-06 ENCOUNTER — Ambulatory Visit (INDEPENDENT_AMBULATORY_CARE_PROVIDER_SITE_OTHER): Payer: Medicaid Other | Admitting: Podiatry

## 2023-03-06 ENCOUNTER — Ambulatory Visit (INDEPENDENT_AMBULATORY_CARE_PROVIDER_SITE_OTHER): Payer: Medicaid Other

## 2023-03-06 DIAGNOSIS — R319 Hematuria, unspecified: Secondary | ICD-10-CM | POA: Insufficient documentation

## 2023-03-06 DIAGNOSIS — M2011 Hallux valgus (acquired), right foot: Secondary | ICD-10-CM | POA: Diagnosis not present

## 2023-03-06 DIAGNOSIS — M2010 Hallux valgus (acquired), unspecified foot: Secondary | ICD-10-CM | POA: Diagnosis not present

## 2023-03-06 DIAGNOSIS — M2012 Hallux valgus (acquired), left foot: Secondary | ICD-10-CM | POA: Diagnosis not present

## 2023-03-06 DIAGNOSIS — M5416 Radiculopathy, lumbar region: Secondary | ICD-10-CM | POA: Insufficient documentation

## 2023-03-06 DIAGNOSIS — M205X9 Other deformities of toe(s) (acquired), unspecified foot: Secondary | ICD-10-CM | POA: Diagnosis not present

## 2023-03-06 NOTE — Progress Notes (Signed)
Subjective:  Patient ID: Amanda Christensen, female    DOB: February 02, 1961,  MRN: [id number] HPI Chief Complaint  Patient presents with   Foot Pain    1st MPJ bilateral (R>L) - bunion deformities x years, started to aching, shoes uncomfortable   New Patient (Initial Visit)    62 y.o. female presents with the above complaint.   ROS: Denies fever chills nausea vomit muscle aches pains calf pain back pain chest pain shortness of breath.  Past Medical History:  Diagnosis Date   Arthritis    Asthma    Chronic pain    COPD (chronic obstructive pulmonary disease)    Depression    history of    DVT (deep venous thrombosis)    h/o 4 blood clots after knee replacement RLE   GERD (gastroesophageal reflux disease)    Heart murmur    High cholesterol    History of anxiety    "used to have anxiety attacks"   History of bronchitis    Hypertension    Insomnia    takes Trazodone nightly   Obesity    Peripheral vascular disease 10   rt leg dvt    Seizures 03   was on medications none now off  since 2011   Shortness of breath dyspnea    Past Surgical History:  Procedure Laterality Date   BREAST EXCISIONAL BIOPSY Left 09/12/2016   BREAST LUMPECTOMY WITH NEEDLE LOCALIZATION Left 09/13/2016   Procedure: LEFT BREAST LUMPECTOMY WITH NEEDLE LOCALIZATION;  Surgeon: Berna Bue, MD;  Location: [facility];  Service: General;  Laterality: Left;   CESAREAN SECTION     COLONOSCOPY     DILATION AND CURETTAGE OF UTERUS     HERNIA REPAIR     umbilical   JOINT REPLACEMENT     miscarriage     REVISION TOTAL SHOULDER TO REVERSE TOTAL SHOULDER Left 08/27/2019   Procedure: REVISION TOTAL SHOULDER TO REVERSE TOTAL SHOULDER;  Surgeon: Bjorn Pippin, MD;  Location[facility];  Service: Orthopedics;  Laterality: Left;   ROTATOR CUFF REPAIR Left    SHOULDER ARTHROSCOPY Left 05/15/2019   Procedure: SHOULDER ARTHROSCOPY WITH MANIPULATION AND LYSIS OF ADHESIONS;  Surgeon: Bjorn Pippin, MD;  Location: [facility];  Service: Orthopedics;  Laterality: Left;   SYNOVIAL BIOPSY Left 05/15/2019   Procedure: SYNOVIAL BIOPSY;  Surgeon: Bjorn Pippin, MD;  Location: [facility];  Service: Orthopedics;  Laterality: Left;   TOTAL KNEE ARTHROPLASTY Right    TOTAL KNEE ARTHROPLASTY Left 09/27/2016   Procedure: LEFT TOTAL KNEE ARTHROPLASTY;  Surgeon: Loreta Ave, MD;  Location: [facility];  Service: Orthopedics;  Laterality: Left;   TOTAL SHOULDER ARTHROPLASTY Left 01/08/2015   dr Ranell Patrick   TOTAL SHOULDER ARTHROPLASTY Left 01/08/2015   Procedure: LEFT TOTAL SHOULDER ARTHROPLASTY;  Surgeon: Verlee Rossetti, MD;  [facility];  Service: Orthopedics;  Laterality: Left;   TUBAL LIGATION      Current Outpatient Medications:    albuterol (PROVENTIL HFA;VENTOLIN HFA) 108 (90 BASE) MCG/ACT inhaler, Inhale 1-2 puffs into the lungs every 6 (six) hours as needed for wheezing or shortness of breath., Disp: 1 Inhaler, Rfl: 0   albuterol (PROVENTIL) (2.5 MG/3ML) 0.083% nebulizer solution, Inhale 2.5 mg into the lungs every 6 (six) hours as needed for wheezing or shortness of breath. , Disp: , Rfl:    Ascorbic Acid (VITAMIN C PO), Take 1 tablet by mouth at bedtime. , Disp: , Rfl:    EYSUVIS  0.25 % SUSP, Apply 1 drop to eye 4 (four) times daily., Disp: , Rfl:    Fluticasone-Salmeterol (ADVAIR) 250-50 MCG/DOSE AEPB, Inhale 1 puff into the lungs daily. , Disp: , Rfl:    HYDROcodone-acetaminophen (NORCO/VICODIN) 5-325 MG tablet, Take 2 tablets by mouth every 6 (six) hours as needed for up to 10 doses for severe pain., Disp: 10 tablet, Rfl: 0   ibuprofen (ADVIL) 600 MG tablet, Take 1 tablet (600 mg total) by mouth every 6 (six) hours as needed., Disp: 30 tablet, Rfl: 0   ondansetron (ZOFRAN-ODT) 4 MG disintegrating tablet, Take 1 tablet (4 mg total) by mouth every 8 (eight) hours as needed for nausea or vomiting., Disp: 20 tablet, Rfl: 0   traZODone (DESYREL) 100 MG tablet, Take 100 mg by mouth at bedtime.,  Disp: , Rfl:    triamcinolone cream (KENALOG) 0.1 %, Apply 1 application topically 2 (two) times daily., Disp: , Rfl:    triamterene-hydrochlorothiazide (MAXZIDE-25) 37.5-25 MG tablet, Take 1 tablet by mouth daily., Disp: , Rfl:   Allergies  Allergen Reactions   Penicillins Itching and Other (See Comments)        Oxycodone Itching   Review of Systems Objective:  There were no vitals filed for this visit.  General: Well developed, nourished, in no acute distress, alert and oriented x3   Dermatological: Skin is warm, dry and supple bilateral. Nails x 10 are well maintained; remaining integument appears unremarkable at this time. There are no open sores, no preulcerative lesions, no rash or signs of infection present.  Vascular: Dorsalis Pedis artery and Posterior Tibial artery pedal pulses are 2/4 bilateral with immedate capillary fill time. Pedal hair growth present. No varicosities and no lower extremity edema present bilateral.   Neruologic: Grossly intact via light touch bilateral. Vibratory intact via tuning fork bilateral. Protective threshold with Semmes Wienstein monofilament intact to all pedal sites bilateral. Patellar and Achilles deep tendon reflexes 2+ bilateral. No Babinski or clonus noted bilateral.   Musculoskeletal: No gross boney pedal deformities bilateral. No pain, crepitus, or limitation noted with foot and ankle range of motion bilateral. Muscular strength 5/5 in all groups tested bilateral.  Pain on end range of motion of the first metatarsophalangeal joints bilateral hallux limitus is noted.  Prominent dorsal spurring is also noted and tender on palpation.  Gait: Unassisted, Nonantalgic.    Radiographs:  Radiographs taken today demonstrate a osseously mature individual with good generalized mineralization.  Also demonstrates hallux abductovalgus deformity joint space narrowing and irregular contour to the base of the proximal phalanx.  Dorsal spurring is noted  subchondral sclerosis is noted primarily on the right side similar findings on the left with minimal angular deformity.  Assessment & Plan:   Assessment: Hallux limitus first metatarsophalangeal joints bilaterally.  Plan: Discussed the possible need for injection therapy oral therapy or joint replacements.  She understands and is amenable to we will follow-up with her on as-needed basis.     Lilyrose Tanney T. Ramos, [place]

## 2023-03-22 ENCOUNTER — Emergency Department (HOSPITAL_BASED_OUTPATIENT_CLINIC_OR_DEPARTMENT_OTHER): Payer: Medicaid Other

## 2023-03-22 ENCOUNTER — Encounter (HOSPITAL_COMMUNITY): Payer: Self-pay

## 2023-03-22 ENCOUNTER — Emergency Department (HOSPITAL_COMMUNITY): Payer: Medicaid Other

## 2023-03-22 ENCOUNTER — Emergency Department (HOSPITAL_COMMUNITY)
Admission: EM | Admit: 2023-03-22 | Discharge: 2023-03-22 | Disposition: A | Discharge status: Home / Self Care | Payer: Medicaid Other | Attending: Emergency Medicine | Admitting: Emergency Medicine

## 2023-03-22 ENCOUNTER — Other Ambulatory Visit: Payer: Self-pay

## 2023-03-22 DIAGNOSIS — M7989 Other specified soft tissue disorders: Secondary | ICD-10-CM

## 2023-03-22 DIAGNOSIS — M25561 Pain in right knee: Secondary | ICD-10-CM | POA: Insufficient documentation

## 2023-03-22 MED ORDER — ACETAMINOPHEN 500 MG PO TABS
1000.0000 mg | ORAL_TABLET | Freq: Once | ORAL | Status: AC
  Administered 2023-03-22: 1000 mg via ORAL
  Filled 2023-03-22: qty 2

## 2023-03-22 MED ORDER — TRAMADOL HCL 50 MG PO TABS: 50.0000 mg | ORAL_TABLET | Freq: Four times a day (QID) | ORAL | 0 refills | Status: DC | PRN

## 2023-03-22 NOTE — Progress Notes (Signed)
RLE venous duplex has been completed.  Preliminary results given to Dr. Donnald Garre.   Results can be found under chart review under CV PROC. 03/22/2023 10:53 AM Jeanifer Halliday RVT, RDMS

## 2023-03-22 NOTE — ED Provider Notes (Signed)
[place] EMERGENCY DEPARTMENT AT [facility] Provider Note   CSN: [id number] Arrival date & time: 03/22/23  7829     History  Chief Complaint  Patient presents with   Leg Swelling    Amanda Christensen is a 62 y.o. female.  HPI Patient reports pain of the right knee and pain off-and-on for a few weeks. No Injury.  Patient reports pain is more pronounced when she is standing up full weightbearing.  Pain is predominantly in the front of the knee.  She reports swelling is more observable when she is in a standing position.  The knee is more stiff than usual.  She reports she did have problems with range of motion of the knee postoperatively with less range of motion than the left but somewhat more decreased since swelling and anterior pain.  No fevers no chills.  Patient has history of total knee replacement on the right by Dr. Charlann Boxer 02/2011 and total knee replacement of the left by Dr. Mckinley Jewel [phone] and left shoulder revision and reversal of total shoulder by Dr. Everardo Pacific secondary to infection and implant complications 08/2019.      Home Medications Prior to Admission medications   Medication Sig Start Date End Date Taking? Authorizing Provider  traMADol (ULTRAM) 50 MG tablet Take 1 tablet (50 mg total) by mouth every 6 (six) hours as needed. 03/22/23  Yes Arby Barrette, MD  albuterol (PROVENTIL HFA;VENTOLIN HFA) 108 (90 BASE) MCG/ACT inhaler Inhale 1-2 puffs into the lungs every 6 (six) hours as needed for wheezing or shortness of breath. 12/15/14   Tilden Fossa, MD  albuterol (PROVENTIL) (2.5 MG/3ML) 0.083% nebulizer solution Inhale 2.5 mg into the lungs every 6 (six) hours as needed for wheezing or shortness of breath.  05/15/19   [provider]  Ascorbic Acid (VITAMIN C PO) Take 1 tablet by mouth at bedtime.     [provider]  EYSUVIS 0.25 % SUSP Apply 1 drop to eye 4 (four) times daily. 12/11/22   [provider]  Fluticasone-Salmeterol  (ADVAIR) 250-50 MCG/DOSE AEPB Inhale 1 puff into the lungs daily.     [provider]  HYDROcodone-acetaminophen (NORCO/VICODIN) 5-325 MG tablet Take 2 tablets by mouth every 6 (six) hours as needed for up to 10 doses for severe pain. 01/15/23   Mardene Sayer, MD  ibuprofen (ADVIL) 600 MG tablet Take 1 tablet (600 mg total) by mouth every 6 (six) hours as needed. 01/11/23   Merrilee Jansky, MD  ondansetron (ZOFRAN-ODT) 4 MG disintegrating tablet Take 1 tablet (4 mg total) by mouth every 8 (eight) hours as needed for nausea or vomiting. 01/15/23   Mardene Sayer, MD  traZODone (DESYREL) 100 MG tablet Take 100 mg by mouth at bedtime.    [provider]  triamcinolone cream (KENALOG) 0.1 % Apply 1 application topically 2 (two) times daily.    [provider]  triamterene-hydrochlorothiazide (MAXZIDE-25) 37.5-25 MG tablet Take 1 tablet by mouth daily.    [provider]      Allergies    Penicillins and Oxycodone    Review of Systems   Review of Systems  Physical Exam Updated Vital Signs BP (!) 141/69   Pulse 80   Temp 97.9 F (36.6 C) (Oral)   Resp 16   Ht 5' (1.524 m)   Wt 94.3 kg   SpO2 100%   BMI 40.62 kg/m  Physical Exam Constitutional:      Comments: Alert nontoxic clinically  well in appearance.  HENT:     Mouth/Throat:     Pharynx: Oropharynx is clear.  Cardiovascular:     Rate and Rhythm: Normal rate and regular rhythm.  Pulmonary:     Effort: Pulmonary effort is normal.     Breath sounds: Normal breath sounds.  Musculoskeletal:     Comments: Patient has significant obesity of the thighs and prepatellar soft tissues.  Somewhat limiting to exam.  Appreciable prepatellar fullness may represent effusion.  No erythema or warmth.  Patient can stand and weight-bear without difficulty.  Patient has intact flexion range of motion on the right to about 100 degrees.  Flexion on the left closer to 150.  Both lower legs are symmetric.   Calves are soft and pliable.  No peripheral edema in the lower legs.  Feet warm and dry with 2+ pulses bilaterally.  See attached images.  Skin:    General: Skin is warm and dry.  Neurological:     General: No focal deficit present.  Psychiatric:        Mood and Affect: Mood normal.        ED Results / Procedures / Treatments   Labs (all labs ordered are listed, but only abnormal results are displayed) Labs Reviewed - No data to display  EKG None  Radiology VAS [place] LOWER EXTREMITY VENOUS (DVT) (7a-7p)  Result Date: 03/22/2023  Lower Venous DVT Study Patient Name:  Amanda Christensen [facility]  Date of Exam:   03/22/2023 Medical Rec #: [id number]       Accession #:    [id number] Date of Birth: [birth year]       Patient Gender: F Patient Age:   78 years Exam Location:  [facility] Procedure:      VAS [place] LOWER EXTREMITY VENOUS (DVT) Referring Phys: [facility] Shahzain Kiester --------------------------------------------------------------------------------  Indications: Right thigh pain and swelling x 1 week.  Comparison Study: No previous RLEV exams Performing Technologist: Jody Hill RVT, RDMS  Examination Guidelines: A complete evaluation includes B-mode imaging, spectral Doppler, color Doppler, and power Doppler as needed of all accessible portions of each vessel. Bilateral testing is considered an integral part of a complete examination. Limited examinations for reoccurring indications may be performed as noted. The reflux portion of the exam is performed with the patient in reverse Trendelenburg.  +---------+---------------+---------+-----------+----------+--------------+ RIGHT    CompressibilityPhasicitySpontaneityPropertiesThrombus Aging +---------+---------------+---------+-----------+----------+--------------+ CFV      Full           Yes      Yes                                 +---------+---------------+---------+-----------+----------+--------------+ SFJ      Full                                                         +---------+---------------+---------+-----------+----------+--------------+ FV Prox  Full           Yes      Yes                                 +---------+---------------+---------+-----------+----------+--------------+ FV Mid   Full           Yes  Yes                                 +---------+---------------+---------+-----------+----------+--------------+ FV DistalFull           Yes      Yes                                 +---------+---------------+---------+-----------+----------+--------------+ PFV      Full                                                        +---------+---------------+---------+-----------+----------+--------------+ POP      Full           Yes      Yes                                 +---------+---------------+---------+-----------+----------+--------------+ PTV      Full                                                        +---------+---------------+---------+-----------+----------+--------------+ PERO     Full                                                        +---------+---------------+---------+-----------+----------+--------------+ Compression of vessels difficult due to patient pain intolerance.  +----+---------------+---------+-----------+----------+--------------+ LEFTCompressibilityPhasicitySpontaneityPropertiesThrombus Aging +----+---------------+---------+-----------+----------+--------------+ CFV Full           Yes      Yes                                 +----+---------------+---------+-----------+----------+--------------+     Summary: RIGHT: - No evidence of deep vein thrombosis in the lower extremity. No indirect evidence of obstruction proximal to the inguinal ligament. - No cystic structure found in the popliteal fossa.  LEFT: - No evidence of common femoral vein obstruction.  *See table(s) above for measurements and observations.    Preliminary    DG Knee Complete 4  Views Right  Result Date: 03/22/2023 CLINICAL DATA:  One day history of worsening right thigh pain and swelling EXAM: RIGHT KNEE - COMPLETE 4 VIEW COMPARISON:  Right knee radiographs dated 10/18/2017 FINDINGS: Status post right knee arthroplasty. Hardware appears intact and well seated, similar to 10/18/2017. No abnormal surrounding lucency. No acute fracture or dislocation. No definite joint effusion. IMPRESSION: Status post right knee arthroplasty without evidence of hardware complication. No acute fracture or dislocation. Electronically Signed   By: Agustin Cree M.D.   On: 03/22/2023 09:45    Procedures Procedures    Medications Ordered in ED Medications  acetaminophen (TYLENOL) tablet 1,000 mg (1,000 mg Oral Given 03/22/23 1107)    ED Course/ Medical Decision Making/ A&P  Medical Decision Making Amount and/or Complexity of Data Reviewed Radiology: ordered.  Risk OTC drugs. Prescription drug management.   Patient is had waxing and waning right knee pain.  She had distant knee replacement.  At this time, I do not appreciate redness or warmth.  Patient also had distant history of DVT perioperatively.  Will proceed with DVT study and x-rays.  DVT study negative.  Plain film x-ray reviewed by radiology no acute findings.  Hardware with normal alignment.  This time I have low suspicion for septic joint.  The knee is not warm to the touch and I cannot perceive any significant effusion although possibly slightly more than the left.  Patient also notes that the swelling and discomfort had increased with increased use and attempted exercise.  At this time I suspect overuse induced soft tissue discomfort.  Patient is advised she must follow-up with her orthopedic provider as soon as possible for recheck.  Symptomatic treatment is recommended for extra strength Tylenol and tramadol if needed with elevating and icing between any activities.  Recommended trying to limit  prolonged standing but maintaining light exercise.        Final Clinical Impression(s) / ED Diagnoses Final diagnoses:  Right knee pain, unspecified chronicity    Rx / DC Orders ED Discharge Orders          Ordered    traMADol (ULTRAM) 50 MG tablet  Every 6 hours PRN        03/22/23 1210              Arby Barrette, MD 03/22/23 1216

## 2023-03-22 NOTE — Discharge Instructions (Addendum)
1.  You need to follow-up with your orthopedic provider.  Call today to schedule a follow-up appointment as soon as possible. 2.  Take extra strength Tylenol as needed every 6 hours for pain.  If you need additional pain control after taking Tylenol you may take 1 tramadol tablet every 6 hours with the extra strength Tylenol. 3.  Try to elevate your leg and ice the knee when possible.  You should continue to get light exercise but try to avoid prolonged standing in 1 place. 4.  Return to emergency department if the knee is red, you get a fever or there is an increase in the swelling. 5.  At this time, you do not appear to have a joint infection or misalignment. You do not have a blood clot in the leg.  It is however important that you schedule your follow-up with your orthopedist as soon as possible for recheck.

## 2023-03-22 NOTE — ED Triage Notes (Signed)
Patient reports R thigh swelling and pain since last week but worse since last night. Swelling started last week, subsided, and has now returned. No OTC meds PTA. Ambulatory.

## 2023-03-27 ENCOUNTER — Ambulatory Visit: Payer: Medicaid Other | Admitting: Cardiovascular Disease

## 2023-04-10 ENCOUNTER — Encounter: Payer: Self-pay | Admitting: Nurse Practitioner

## 2023-04-18 ENCOUNTER — Ambulatory Visit: Payer: Medicaid Other | Attending: Cardiovascular Disease | Admitting: Cardiovascular Disease

## 2023-04-18 ENCOUNTER — Encounter: Payer: Self-pay | Admitting: Cardiovascular Disease

## 2023-04-18 VITALS — BP 112/62 | HR 58 | Ht 60.0 in | Wt 204.2 lb

## 2023-04-18 DIAGNOSIS — Z86718 Personal history of other venous thrombosis and embolism: Secondary | ICD-10-CM

## 2023-04-18 DIAGNOSIS — I471 Supraventricular tachycardia, unspecified: Secondary | ICD-10-CM

## 2023-04-18 DIAGNOSIS — E782 Mixed hyperlipidemia: Secondary | ICD-10-CM | POA: Diagnosis not present

## 2023-04-18 DIAGNOSIS — I10 Essential (primary) hypertension: Secondary | ICD-10-CM

## 2023-04-18 NOTE — Assessment & Plan Note (Signed)
History of essential hypertension with blood pressure measured today at 112/62.  She is on Maxide.

## 2023-04-18 NOTE — Assessment & Plan Note (Signed)
History of hyperlipidemia in the past not on statin therapy.  Will recheck a lipid liver profile.

## 2023-04-18 NOTE — Assessment & Plan Note (Signed)
History of SVT seen on monitoring in [place].  She was placed on metoprolol which resulted in improvement in her symptoms.  She no longer is taking metoprolol.

## 2023-04-18 NOTE — Progress Notes (Signed)
04/18/2023 Amanda Christensen   06/06/1961  [id number]  Primary Physician Pcp, No Primary Cardiologist: Runell Gess MD Milagros Loll, [place], [place]  HPI:  Amanda Christensen is a 62 y.o. severely overweight divorced African-American father of 3 children, grandmother of 12 grandchildren who is disabled because of arthritis.  She was referred by her PCP for cardiovascular valuation because of atypical chest pain.  She has seen Dr. Elease Hashimoto in the past 03/27/2014.  He did do a nuclear stress test which was low risk and nonischemic.  She subsequently moved to [place].  Her risk factors include 35-pack-year tobacco abuse having quit 10 to 15 years ago.  She has treated hypertension, untreated hyperlipidemia.  She is never had a heart attack or stroke.  She has had bilateral total knee replacements and shoulder replacement as well.  Apparently in the breast that she had a monitor placed which showed SVT and was placed on a beta-blocker which improved her symptoms.  She complains of occasional atypical cramping sensation in her chest and back.   Current Meds  Medication Sig   albuterol (PROVENTIL HFA;VENTOLIN HFA) 108 (90 BASE) MCG/ACT inhaler Inhale 1-2 puffs into the lungs every 6 (six) hours as needed for wheezing or shortness of breath.   albuterol (PROVENTIL) (2.5 MG/3ML) 0.083% nebulizer solution Inhale 2.5 mg into the lungs every 6 (six) hours as needed for wheezing or shortness of breath.    Ascorbic Acid (VITAMIN C PO) Take 1 tablet by mouth at bedtime.    EYSUVIS 0.25 % SUSP Apply 1 drop to eye 4 (four) times daily.   Fluticasone-Salmeterol (ADVAIR) 250-50 MCG/DOSE AEPB Inhale 1 puff into the lungs daily.    ibuprofen (ADVIL) 600 MG tablet Take 1 tablet (600 mg total) by mouth every 6 (six) hours as needed.   traMADol (ULTRAM) 50 MG tablet Take 1 tablet (50 mg total) by mouth every 6 (six) hours as needed.   traZODone (DESYREL) 100 MG tablet Take 100 mg by mouth at bedtime.   triamcinolone cream  (KENALOG) 0.1 % Apply 1 application topically 2 (two) times daily.   triamterene-hydrochlorothiazide (MAXZIDE-25) 37.5-25 MG tablet Take 1 tablet by mouth daily.     Allergies  Allergen Reactions   Penicillins Itching and Other (See Comments)        Oxycodone Itching    Social History   Socioeconomic History   Marital status: Divorced    Spouse name: Not on file   Number of children: 3   Years of education: Not on file   Highest education level: Not on file  Occupational History   Occupation: disabled  Tobacco Use   Smoking status: Former    Packs/day: 0.50    Years: 17.00    Additional pack years: 0.00    Total pack years: 8.50    Types: Cigarettes    Quit date: 11/20/2012    Years since quitting: 10.4   Smokeless tobacco: Former  Building services engineer Use: Never used  Substance and Sexual Activity   Alcohol use: No   Drug use: Not Currently   Sexual activity: Yes    Birth control/protection: Surgical  Other Topics Concern   Not on file  Social History Narrative   Not on file   Social Determinants of Health   Financial Resource Strain: Not on file  Food Insecurity: Not on file  Transportation Needs: Not on file  Physical Activity: Not on file  Stress: Not on file  Social  Connections: Not on file  Intimate Partner Violence: Not on file     Review of Systems: General: negative for chills, fever, night sweats or weight changes.  Cardiovascular: negative for chest pain, dyspnea on exertion, edema, orthopnea, palpitations, paroxysmal nocturnal dyspnea or shortness of breath Dermatological: negative for rash Respiratory: negative for cough or wheezing Urologic: negative for hematuria Abdominal: negative for nausea, vomiting, diarrhea, bright red blood per rectum, melena, or hematemesis Neurologic: negative for visual changes, syncope, or dizziness All other systems reviewed and are otherwise negative except as noted above.    Blood pressure 112/62, pulse (!)  58, height 5' (1.524 m), weight 204 lb 3.2 oz (92.6 kg), SpO2 98 %.  General appearance: alert and no distress Neck: no adenopathy, no carotid bruit, no JVD, supple, symmetrical, trachea midline, and thyroid not enlarged, symmetric, no tenderness/mass/nodules Lungs: clear to auscultation bilaterally Heart: regular rate and rhythm, S1, S2 normal, no murmur, click, rub or gallop Extremities: extremities normal, atraumatic, no cyanosis or edema Pulses: 2+ and symmetric Skin: Skin color, texture, turgor normal. No rashes or lesions Neurologic: Grossly normal  EKG sinus bradycardia 58 without ST or T wave changes.  I personally reviewed this EKG.  ASSESSMENT AND PLAN:   Hypertension History of essential hypertension with blood pressure measured today at 112/62.  She is on Maxide.  Chest tightness or pressure History of atypical chest pain with negative Myoview stress test performed 03/26/2014.  She still gets occasional "muscle spasms in her chest and back.  I am going to obtain a Lexiscan Myoview to further evaluate.  History of DVT (deep vein thrombosis) History of DVT in the past on Coumadin.  Most recent venous Doppler study performed 03/22/2023 showed no evidence of DVT.  Hyperlipidemia History of hyperlipidemia in the past not on statin therapy.  Will recheck a lipid liver profile.  SVT (supraventricular tachycardia) History of SVT seen on monitoring in [place].  She was placed on metoprolol which resulted in improvement in her symptoms.  She no longer is taking metoprolol.     Runell Gess MD FACP,FACC,FAHA, [facility] 04/18/2023 12:11 PM

## 2023-04-18 NOTE — Assessment & Plan Note (Signed)
History of DVT in the past on Coumadin.  Most recent venous Doppler study performed 03/22/2023 showed no evidence of DVT.

## 2023-04-18 NOTE — Patient Instructions (Signed)
Medication Instructions:  Your physician recommends that you continue on your current medications as directed. Please refer to the Current Medication list given to you today.  *If you need a refill on your cardiac medications before your next appointment, please call your pharmacy*   Lab Work: Your physician recommends that you return for lab work in: in the next week or 2 for FASTING lipid/liver panel  If you have labs (blood work) drawn today and your tests are completely normal, you will receive your results only by: MyChart Message (if you have MyChart) OR A paper copy in the mail If you have any lab test that is abnormal or we need to change your treatment, we will call you to review the results.   Testing/Procedures: Your physician has requested that you have a lexiscan myoview. For further information please visit [email or web address] Please follow instruction sheet, as given. This will take place at [address]  How to prepare for your Myocardial Perfusion Test: Do not eat or drink 3 hours prior to your test, except you may have water. Do not consume products containing caffeine (regular or decaffeinated) 12 hours prior to your test. (ex: coffee, chocolate, sodas, tea). Do bring a list of your current medications with you.  If not listed below, you may take your medications as normal. Do wear comfortable clothes (no dresses or overalls) and walking shoes, tennis shoes preferred (No heels or open toe shoes are allowed). Do NOT wear cologne, perfume, aftershave, or lotions (deodorant is allowed). The test will take approximately 3 to 4 hours to complete If these instructions are not followed, your test will have to be rescheduled.     Follow-Up: At [facility], you and your health needs are our priority.  As part of our continuing mission to provide you with exceptional heart care, we have created designated Provider Care Teams.  These Care Teams include  your primary Cardiologist (physician) and Advanced Practice Providers (APPs -  Physician Assistants and Nurse Practitioners) who all work together to provide you with the care you need, when you need it.  We recommend signing up for the patient portal called "MyChart".  Sign up information is provided on this After Visit Summary.  MyChart is used to connect with patients for Virtual Visits (Telemedicine).  Patients are able to view lab/test results, encounter notes, upcoming appointments, etc.  Non-urgent messages can be sent to your provider as well.   To learn more about what you can do with MyChart, go to [email or web address].    Your next appointment:   We will see you on an as needed basis  Provider:   Nanetta Batty, MD

## 2023-04-18 NOTE — Assessment & Plan Note (Signed)
History of atypical chest pain with negative Myoview stress test performed 03/26/2014.  She still gets occasional "muscle spasms in her chest and back.  I am going to obtain a Lexiscan Myoview to further evaluate.

## 2023-04-20 ENCOUNTER — Ambulatory Visit (INDEPENDENT_AMBULATORY_CARE_PROVIDER_SITE_OTHER): Payer: Medicaid Other | Admitting: Physician Assistant

## 2023-04-20 ENCOUNTER — Encounter: Payer: Self-pay | Admitting: Physician Assistant

## 2023-04-20 ENCOUNTER — Telehealth: Payer: Self-pay | Admitting: Physician Assistant

## 2023-04-20 VITALS — BP 118/68 | HR 87 | Ht 60.0 in | Wt 207.0 lb

## 2023-04-20 DIAGNOSIS — K5900 Constipation, unspecified: Secondary | ICD-10-CM

## 2023-04-20 DIAGNOSIS — Z8719 Personal history of other diseases of the digestive system: Secondary | ICD-10-CM | POA: Diagnosis not present

## 2023-04-20 DIAGNOSIS — Z800 Family history of malignant neoplasm of digestive organs: Secondary | ICD-10-CM

## 2023-04-20 MED ORDER — NA SULFATE-K SULFATE-MG SULF 17.5-3.13-1.6 GM/177ML PO SOLN: 1.0000 | Freq: Once | ORAL | 0 refills | Status: AC

## 2023-04-20 NOTE — Telephone Encounter (Signed)
Patient states she typically gets all of her medication from [facility]. Will fax order to them.

## 2023-04-20 NOTE — Patient Instructions (Signed)
Start Miralax 1 capful twice daily in 8 ounces of liquid.  You have been scheduled for a colonoscopy. Please follow written instructions given to you at your visit today.  Please pick up your prep supplies at the pharmacy within the next 1-3 days. If you use inhalers (even only as needed), please bring them with you on the day of your procedure.  _______________________________________________________  If your blood pressure at your visit was 140/90 or greater, please contact your primary care physician to follow up on this.  _______________________________________________________  If you are age 24 or older, your body mass index should be between 23-30. Your Body mass index is 40.43 kg/m. If this is out of the aforementioned range listed, please consider follow up with your Primary Care Provider.  If you are age 52 or younger, your body mass index should be between 19-25. Your Body mass index is 40.43 kg/m. If this is out of the aformentioned range listed, please consider follow up with your Primary Care Provider.   ________________________________________________________  The [facility] providers would like to encourage you to use [facility] to communicate with providers for non-urgent requests or questions.  Due to long hold times on the telephone, sending your provider a message by [facility] may be a faster and more efficient way to get a response.  Please allow 48 business hours for a response.  Please remember that this is for non-urgent requests.  _______________________________________________________

## 2023-04-20 NOTE — Progress Notes (Signed)
Chief Complaint: History of diverticulitis and constipation  HPI:    Amanda Christensen is a 62 year old African-American female with a past medical history as listed below including COPD, DVT, GERD, PVD and seizures, assigned to Dr. Tomasa Rand at last visit, who presents to clinic today for follow-up of her diverticulitis and a new complaint of constipation.    10/2013 colonoscopy with Dr. Elnoria Howard with a 4 mm sessile polyp in the rectosigmoid colon shown to be a hyperplastic polyp.  As well as medium mouth diverticula in the sigmoid colon and ascending colon as well as external and internal hemorrhoids.    01/19/2023 patient seen in clinic by Willette Cluster for acute uncomplicated sigmoid diverticulitis on CT scan 2/26.  Patient had not completed Cipro/Flagyl given dizziness and was instead prescribed Bactrim DS twice daily x 7 days.  Told to follow-up in April to discuss colonoscopy.  Mentioned a history of colon cancer in her family.    Today, patient presents to clinic and tells me that her pain from diverticulitis is gone after finishing her antibiotics.  Since then she has had occasional left lower quadrant pain off-and-on but does tell me that she is constipated.  Apparently has issues with chronic constipation for which she has tried various stool softeners it sounds like, none of which really helped her.  Recently this has all gotten worse over the past few weeks telling me that she other one have a bowel movement at all will have a very small ball and never feels completely emptied.  Currently not on anything.    Discusses family history of colon cancer in her maternal grandmother and she thinks her maternal grandfather and maternal aunt.    Patient is currently undergoing cardiac workup for what sounds like palpitations.  She has a myocardial perfusion scan scheduled on June 14.    Denies fever, chills, weight loss, blood in her stool or symptoms that awaken her from sleep.  Past Medical History:   Diagnosis Date   Arthritis    Asthma    Chronic pain    COPD (chronic obstructive pulmonary disease) (HCC)    Depression    history of    DVT (deep venous thrombosis) (HCC)    h/o 4 blood clots after knee replacement RLE   GERD (gastroesophageal reflux disease)    Heart murmur    High cholesterol    History of anxiety    "used to have anxiety attacks"   History of bronchitis    Hypertension    Insomnia    takes Trazodone nightly   Obesity    Peripheral vascular disease (HCC) 10   rt leg dvt    Seizures (HCC) 03   was on medications none now off  since 2011   Shortness of breath dyspnea     Past Surgical History:  Procedure Laterality Date   BREAST EXCISIONAL BIOPSY Left 09/12/2016   BREAST LUMPECTOMY WITH NEEDLE LOCALIZATION Left 09/13/2016   Procedure: LEFT BREAST LUMPECTOMY WITH NEEDLE LOCALIZATION;  Surgeon: Berna Bue, MD;  Location: [facility] OR;  Service: General;  Laterality: Left;   CESAREAN SECTION     COLONOSCOPY     DILATION AND CURETTAGE OF UTERUS     HERNIA REPAIR     umbilical   JOINT REPLACEMENT     miscarriage     REVISION TOTAL SHOULDER TO REVERSE TOTAL SHOULDER Left 08/27/2019   Procedure: REVISION TOTAL SHOULDER TO REVERSE TOTAL SHOULDER;  Surgeon: Bjorn Pippin, MD;  Location: [facility];  Service: Orthopedics;  Laterality: Left;   ROTATOR CUFF REPAIR Left    SHOULDER ARTHROSCOPY Left 05/15/2019   Procedure: SHOULDER ARTHROSCOPY WITH MANIPULATION AND LYSIS OF ADHESIONS;  Surgeon: Bjorn Pippin, MD;  Location: [facility];  Service: Orthopedics;  Laterality: Left;   SYNOVIAL BIOPSY Left 05/15/2019   Procedure: SYNOVIAL BIOPSY;  Surgeon: Bjorn Pippin, MD;  Location: [facility];  Service: Orthopedics;  Laterality: Left;   TOTAL KNEE ARTHROPLASTY Right    TOTAL KNEE ARTHROPLASTY Left 09/27/2016   Procedure: LEFT TOTAL KNEE ARTHROPLASTY;  Surgeon: Loreta Ave, MD;  Location: [facility] OR;  Service: Orthopedics;  Laterality: Left;    TOTAL SHOULDER ARTHROPLASTY Left 01/08/2015   dr Ranell Patrick   TOTAL SHOULDER ARTHROPLASTY Left 01/08/2015   Procedure: LEFT TOTAL SHOULDER ARTHROPLASTY;  Surgeon: Verlee Rossetti, MD;  Location: [facility] OR;  Service: Orthopedics;  Laterality: Left;   TUBAL LIGATION      Current Outpatient Medications  Medication Sig Dispense Refill   albuterol (PROVENTIL HFA;VENTOLIN HFA) 108 (90 BASE) MCG/ACT inhaler Inhale 1-2 puffs into the lungs every 6 (six) hours as needed for wheezing or shortness of breath. 1 Inhaler 0   albuterol (PROVENTIL) (2.5 MG/3ML) 0.083% nebulizer solution Inhale 2.5 mg into the lungs every 6 (six) hours as needed for wheezing or shortness of breath.      Ascorbic Acid (VITAMIN C PO) Take 1 tablet by mouth at bedtime.      EYSUVIS 0.25 % SUSP Apply 1 drop to eye 4 (four) times daily.     Fluticasone-Salmeterol (ADVAIR) 250-50 MCG/DOSE AEPB Inhale 1 puff into the lungs daily.      ibuprofen (ADVIL) 600 MG tablet Take 1 tablet (600 mg total) by mouth every 6 (six) hours as needed. 30 tablet 0   traMADol (ULTRAM) 50 MG tablet Take 1 tablet (50 mg total) by mouth every 6 (six) hours as needed. 20 tablet 0   traZODone (DESYREL) 100 MG tablet Take 100 mg by mouth at bedtime.     triamcinolone cream (KENALOG) 0.1 % Apply 1 application topically 2 (two) times daily.     triamterene-hydrochlorothiazide (MAXZIDE-25) 37.5-25 MG tablet Take 1 tablet by mouth daily.     No current facility-administered medications for this visit.    Allergies as of 04/20/2023 - Review Complete 04/20/2023  Allergen Reaction Noted   Penicillins Itching and Other (See Comments) 01/07/2012   Oxycodone Itching 09/05/2016    Family History  Problem Relation Age of Onset   Cancer Father    Prostate cancer Father    Breast cancer Sister    Hypertension Other        family history   Cancer Other        family history   Drug abuse Other        family history   Other Other        family history of psychiatric  problems, disabilities   Liver disease Neg Hx    Esophageal cancer Neg Hx     Social History   Socioeconomic History   Marital status: Divorced    Spouse name: Not on file   Number of children: 3   Years of education: Not on file   Highest education level: Not on file  Occupational History   Occupation: disabled  Tobacco Use   Smoking status: Former    Packs/day: 0.50    Years: 17.00    Additional pack years: 0.00  Total pack years: 8.50    Types: Cigarettes    Quit date: 11/20/2012    Years since quitting: 10.4   Smokeless tobacco: Former  Building services engineer Use: Never used  Substance and Sexual Activity   Alcohol use: No   Drug use: Not Currently   Sexual activity: Yes    Birth control/protection: Surgical  Other Topics Concern   Not on file  Social History Narrative   Not on file   Social Determinants of Health   Financial Resource Strain: Not on file  Food Insecurity: Not on file  Transportation Needs: Not on file  Physical Activity: Not on file  Stress: Not on file  Social Connections: Not on file  Intimate Partner Violence: Not on file    Review of Systems:    Constitutional: No weight loss, fever or chills Cardiovascular: No chest pain Respiratory: No SOB Gastrointestinal: See HPI and otherwise negative   Physical Exam:  Vital signs: BP 118/68   Pulse 87   Ht 5' (1.524 m)   Wt 207 lb (93.9 kg)   BMI 40.43 kg/m   Constitutional:   Pleasant obese AA female appears to be in NAD, Well developed, Well nourished, alert and cooperative Respiratory: Respirations even and unlabored. Lungs clear to auscultation bilaterally.   No wheezes, crackles, or rhonchi.  Cardiovascular: Normal S1, S2. No MRG. Regular rate and rhythm. No peripheral edema, cyanosis or pallor.  Gastrointestinal:  Soft, nondistended, nontender. No rebound or guarding. Normal bowel sounds. No appreciable masses or hepatomegaly. Rectal:  Not performed.  Psychiatric: Oriented to  person, place and time. Demonstrates good judgement and reason without abnormal affect or behaviors.  RELEVANT LABS AND IMAGING: CBC    Component Value Date/Time   WBC 4.8 01/15/2023 1524   RBC 4.80 01/15/2023 1524   HGB 13.3 01/15/2023 1524   HCT 43.4 01/15/2023 1524   PLT 185 01/15/2023 1524   MCV 90.4 01/15/2023 1524   MCH 27.7 01/15/2023 1524   MCHC 30.6 01/15/2023 1524   RDW 12.3 01/15/2023 1524   LYMPHSABS 2,087 07/14/2019 1525   MONOABS 1.1 (H) 01/04/2019 2206   EOSABS 7 (L) 07/14/2019 1525   BASOSABS 43 07/14/2019 1525    CMP     Component Value Date/Time   NA 137 01/15/2023 1524   K 3.8 01/15/2023 1524   CL 106 01/15/2023 1524   CO2 26 01/15/2023 1524   GLUCOSE 143 (H) 01/15/2023 1524   BUN 12 01/15/2023 1524   CREATININE 0.71 01/15/2023 1524   CREATININE 1.01 07/14/2019 1525   CALCIUM 8.9 01/15/2023 1524   PROT 7.7 01/15/2023 1524   ALBUMIN 3.8 01/15/2023 1524   AST 20 01/15/2023 1524   ALT 17 01/15/2023 1524   ALKPHOS 92 01/15/2023 1524   BILITOT 0.9 01/15/2023 1524   GFRNONAA >60 01/15/2023 1524   GFRAA >60 08/19/2019 1041    Assessment: 1.  History of diverticulitis: Recent episode, unable to tolerate Cipro/Flagyl and given Bactrim, acute pain has resolved 2.  Family history of colon cancer: Patient reports in her maternal grandmother, maternal grandfather and maternal aunt 3.  Chronic constipation: Has always had trouble with small bowel movements or no bowel movement at all, worse over the past few weeks; likely slow transit/IBS  Plan: 1.  Given recent episode of diverticulitis and family history of colon cancer recommend patient proceed with colonoscopy.  This was scheduled with Dr. Tomasa Rand in the [facility].  Did provide the patient a detailed list of risks  for the procedure and she agrees to proceed. Patient is appropriate for endoscopic procedure(s) in the ambulatory ([facility]) setting.  2.  Patient is scheduled for myocardial perfusion scan on 6/14.  Will  make sure that her colonoscopy is scheduled after this and we will also send cardiac clearance to Dr. Allyson Sabal for procedure. 3.  Recommend patient start MiraLAX over-the-counter twice daily for now to help with chronic constipation 4.  Patient will have a 2-day bowel prep. 5.  Patient to follow in clinic per recommendations after time of procedure.  Hyacinth Meeker, PA-C [facility] 04/20/2023, 2:53 PM

## 2023-04-20 NOTE — Telephone Encounter (Signed)
PT's RX for Suprep was sent to  [facility] and she is not a customer there. Please advise.

## 2023-04-25 ENCOUNTER — Encounter (HOSPITAL_COMMUNITY): Payer: Medicaid Other

## 2023-04-25 MED ORDER — NA SULFATE-K SULFATE-MG SULF 17.5-3.13-1.6 GM/177ML PO SOLN: 1.0000 | Freq: Once | ORAL | 0 refills | Status: DC

## 2023-04-25 NOTE — Progress Notes (Signed)
Agree with the assessment and plan as outlined by Jennifer Lemmon, PA-C. ? ?Lyberti Thrush E. Kolina Kube, MD ? ?

## 2023-04-25 NOTE — Telephone Encounter (Signed)
Inbound call from patient f/u on previous note.  Please advise. 

## 2023-04-26 MED ORDER — NA SULFATE-K SULFATE-MG SULF 17.5-3.13-1.6 GM/177ML PO SOLN: 1.0000 | Freq: Once | ORAL | 0 refills | Status: AC

## 2023-04-26 NOTE — Telephone Encounter (Signed)
Faxed information and script for Suprep to [facility].

## 2023-04-26 NOTE — Addendum Note (Signed)
Addended by: Mariane Duval on: 04/26/2023 01:53 PM   Modules accepted: Orders

## 2023-04-26 NOTE — Telephone Encounter (Signed)
Patient informed information has been faxed to [facility].

## 2023-04-27 ENCOUNTER — Encounter (HOSPITAL_COMMUNITY): Payer: Medicaid Other

## 2023-05-02 ENCOUNTER — Other Ambulatory Visit: Payer: Medicaid Other

## 2023-05-02 LAB — HEPATIC FUNCTION PANEL
ALT: 30 IU/L (ref 0–32)
AST: 20 IU/L (ref 0–40)
Albumin: 4.1 g/dL (ref 3.9–4.9)
Alkaline Phosphatase: 114 IU/L (ref 44–121)
Bilirubin Total: 0.8 mg/dL (ref 0.0–1.2)
Bilirubin, Direct: 0.22 mg/dL (ref 0.00–0.40)
Total Protein: 6.6 g/dL (ref 6.0–8.5)

## 2023-05-02 LAB — LIPID PANEL
Chol/HDL Ratio: 1.9 ratio (ref 0.0–4.4)
Cholesterol, Total: 170 mg/dL (ref 100–199)
HDL: 90 mg/dL (ref >39)
LDL Chol Calc (NIH): 65 mg/dL (ref 0–99)
Triglycerides: 85 mg/dL (ref 0–149)
VLDL Cholesterol Cal: 15 mg/dL (ref 5–40)

## 2023-05-02 NOTE — Telephone Encounter (Signed)
Spoke with patient and she was given detailed instructions about her STRESS TEST on 05/04/23 at 10:00.

## 2023-05-04 ENCOUNTER — Ambulatory Visit (HOSPITAL_COMMUNITY): Payer: Medicaid Other | Attending: Cardiovascular Disease

## 2023-05-04 DIAGNOSIS — Z86718 Personal history of other venous thrombosis and embolism: Secondary | ICD-10-CM | POA: Insufficient documentation

## 2023-05-04 DIAGNOSIS — I471 Supraventricular tachycardia, unspecified: Secondary | ICD-10-CM | POA: Insufficient documentation

## 2023-05-04 DIAGNOSIS — I10 Essential (primary) hypertension: Secondary | ICD-10-CM | POA: Insufficient documentation

## 2023-05-04 DIAGNOSIS — R079 Chest pain, unspecified: Secondary | ICD-10-CM | POA: Diagnosis not present

## 2023-05-04 DIAGNOSIS — E782 Mixed hyperlipidemia: Secondary | ICD-10-CM | POA: Insufficient documentation

## 2023-05-04 DIAGNOSIS — I2510 Atherosclerotic heart disease of native coronary artery without angina pectoris: Secondary | ICD-10-CM | POA: Insufficient documentation

## 2023-05-04 DIAGNOSIS — I739 Peripheral vascular disease, unspecified: Secondary | ICD-10-CM | POA: Insufficient documentation

## 2023-05-04 LAB — MYOCARDIAL PERFUSION IMAGING
LV dias vol: 70 mL (ref 46–106)
LV sys vol: 27 mL
Nuc Stress EF: 61 %
Peak HR: 76 {beats}/min
Rest HR: 45 {beats}/min
Rest Nuclear Isotope Dose: 10.1 mCi
SDS: 0
SRS: 0
SSS: 0
ST Depression (mm): 0 mm
Stress Nuclear Isotope Dose: 32.5 mCi
TID: 1

## 2023-05-04 MED ORDER — REGADENOSON 0.4 MG/5ML IV SOLN
0.4000 mg | Freq: Once | INTRAVENOUS | Status: AC
  Administered 2023-05-04: 0.4 mg via INTRAVENOUS

## 2023-05-04 MED ORDER — TECHNETIUM TC 99M TETROFOSMIN IV KIT
10.1000 | PACK | Freq: Once | INTRAVENOUS | Status: AC | PRN
  Administered 2023-05-04: 10.1 via INTRAVENOUS

## 2023-05-04 MED ORDER — TECHNETIUM TC 99M TETROFOSMIN IV KIT
32.5000 | PACK | Freq: Once | INTRAVENOUS | Status: AC | PRN
  Administered 2023-05-04: 32.5 via INTRAVENOUS

## 2023-05-09 NOTE — Progress Notes (Unsigned)
Referring Physician:  Jethro Bastos, MD [address] [place],  [place] [address]  Primary Physician:  Patient, No Pcp Per  History of Present Illness: 05/09/2023 Ms. Ishrat Romos is here today with a chief complaint of ***  Back pain? Leg pain?  Duration: *** Location: *** Quality: *** Severity: *** 10/10 Precipitating: aggravated by *** Modifying factors: made better by *** Weakness: none Timing: ***constant Bowel/Bladder Dysfunction: none  Conservative measures:  Physical therapy: *** has not participated in? Multimodal medical therapy including regular antiinflammatories: *** ibuprofen, tramadol  Injections: *** has received epidural steroid injections in the past?  Past Surgery: ***denies   NYELLE BAUDER has ***no symptoms of cervical myelopathy.  The symptoms are causing a significant impact on the patient's life.   Review of Systems:  A 10 point review of systems is negative, except for the pertinent positives and negatives detailed in the HPI.  Past Medical History: Past Medical History:  Diagnosis Date   Arthritis    Asthma    Chronic pain    COPD (chronic obstructive pulmonary disease) (HCC)    Depression    history of    DVT (deep venous thrombosis) (HCC)    h/o 4 blood clots after knee replacement RLE   GERD (gastroesophageal reflux disease)    Heart murmur    High cholesterol    History of anxiety    "used to have anxiety attacks"   History of bronchitis    Hypertension    Insomnia    takes Trazodone nightly   Obesity    Peripheral vascular disease (HCC) 10   rt leg dvt    Seizures (HCC) 03   was on medications none now off  since 2011   Shortness of breath dyspnea     Past Surgical History: Past Surgical History:  Procedure Laterality Date   BREAST EXCISIONAL BIOPSY Left 09/12/2016   BREAST LUMPECTOMY WITH NEEDLE LOCALIZATION Left 09/13/2016   Procedure: LEFT BREAST LUMPECTOMY WITH NEEDLE LOCALIZATION;  Surgeon: Berna Bue, MD;  Location: [facility] OR;  Service: General;  Laterality: Left;   CESAREAN SECTION     COLONOSCOPY     DILATION AND CURETTAGE OF UTERUS     HERNIA REPAIR     umbilical   JOINT REPLACEMENT     miscarriage     REVISION TOTAL SHOULDER TO REVERSE TOTAL SHOULDER Left 08/27/2019   Procedure: REVISION TOTAL SHOULDER TO REVERSE TOTAL SHOULDER;  Surgeon: Bjorn Pippin, MD;  Location: [facility]S;  Service: Orthopedics;  Laterality: Left;   ROTATOR CUFF REPAIR Left    SHOULDER ARTHROSCOPY Left 05/15/2019   Procedure: SHOULDER ARTHROSCOPY WITH MANIPULATION AND LYSIS OF ADHESIONS;  Surgeon: Bjorn Pippin, MD;  Location: [facility];  Service: Orthopedics;  Laterality: Left;   SYNOVIAL BIOPSY Left 05/15/2019   Procedure: SYNOVIAL BIOPSY;  Surgeon: Bjorn Pippin, MD;  Location: [facility];  Service: Orthopedics;  Laterality: Left;   TOTAL KNEE ARTHROPLASTY Right    TOTAL KNEE ARTHROPLASTY Left 09/27/2016   Procedure: LEFT TOTAL KNEE ARTHROPLASTY;  Surgeon: Loreta Ave, MD;  Location: [facility] OR;  Service: Orthopedics;  Laterality: Left;   TOTAL SHOULDER ARTHROPLASTY Left 01/08/2015   dr Ranell Patrick   TOTAL SHOULDER ARTHROPLASTY Left 01/08/2015   Procedure: LEFT TOTAL SHOULDER ARTHROPLASTY;  Surgeon: Verlee Rossetti, MD;  Location: [facility] OR;  Service: Orthopedics;  Laterality: Left;   TUBAL LIGATION      Allergies: Allergies as of 05/10/2023 -  Review Complete 05/04/2023  Allergen Reaction Noted   Penicillins Itching and Other (See Comments) 01/07/2012   Oxycodone Itching 09/05/2016    Medications: Outpatient Encounter Medications as of 05/10/2023  Medication Sig   albuterol (PROVENTIL HFA;VENTOLIN HFA) 108 (90 BASE) MCG/ACT inhaler Inhale 1-2 puffs into the lungs every 6 (six) hours as needed for wheezing or shortness of breath.   albuterol (PROVENTIL) (2.5 MG/3ML) 0.083% nebulizer solution Inhale 2.5 mg into the lungs every 6 (six) hours as needed for wheezing or shortness of  breath.    Ascorbic Acid (VITAMIN C PO) Take 1 tablet by mouth at bedtime.    EYSUVIS 0.25 % SUSP Apply 1 drop to eye 4 (four) times daily.   Fluticasone-Salmeterol (ADVAIR) 250-50 MCG/DOSE AEPB Inhale 1 puff into the lungs daily.    ibuprofen (ADVIL) 600 MG tablet Take 1 tablet (600 mg total) by mouth every 6 (six) hours as needed.   traMADol (ULTRAM) 50 MG tablet Take 1 tablet (50 mg total) by mouth every 6 (six) hours as needed.   traZODone (DESYREL) 100 MG tablet Take 100 mg by mouth at bedtime.   triamcinolone cream (KENALOG) 0.1 % Apply 1 application topically 2 (two) times daily.   triamterene-hydrochlorothiazide (MAXZIDE-25) 37.5-25 MG tablet Take 1 tablet by mouth daily.   No facility-administered encounter medications on file as of 05/10/2023.    Social History: Social History   Tobacco Use   Smoking status: Former    Packs/day: 0.50    Years: 17.00    Additional pack years: 0.00    Total pack years: 8.50    Types: Cigarettes    Quit date: 11/20/2012    Years since quitting: 10.4   Smokeless tobacco: Former  Building services engineer Use: Never used  Substance Use Topics   Alcohol use: No   Drug use: Not Currently    Family Medical History: Family History  Problem Relation Age of Onset   Cancer Father    Prostate cancer Father    Breast cancer Sister    Hypertension Other        family history   Cancer Other        family history   Drug abuse Other        family history   Other Other        family history of psychiatric problems, disabilities   Liver disease Neg Hx    Esophageal cancer Neg Hx     Physical Examination: @VITALWITHPAIN @  General: Patient is well developed, well nourished, calm, collected, and in no apparent distress. Attention to examination is appropriate.  Psychiatric: Patient is non-anxious.  Head:  Pupils equal, round, and reactive to light.  ENT:  Oral mucosa appears well hydrated.  Neck:   Supple.  ***Full range of  motion.  Respiratory: Patient is breathing without any difficulty.  Extremities: No edema.  Vascular: Palpable dorsal pedal pulses.  Skin:   On exposed skin, there are no abnormal skin lesions.  NEUROLOGICAL:     Awake, alert, oriented to person, place, and time.  Speech is clear and fluent. Fund of knowledge is appropriate.   Cranial Nerves: Pupils equal round and reactive to light.  Facial tone is symmetric.  Facial sensation is symmetric.  ROM of spine: ***full.  Palpation of spine: ***non tender.    Strength: Side Biceps Triceps Deltoid Interossei Grip Wrist Ext. Wrist Flex.  R 5 5 5 5 5 5 5   L 5 5 5 5 5  5  5   Side Iliopsoas Quads Hamstring PF DF EHL  R 5 5 5 5 5 5   L 5 5 5 5 5 5    Reflexes are ***2+ and symmetric at the biceps, triceps, brachioradialis, patella and achilles.   Hoffman's is absent.  Clonus is not present.  Toes are down-going.  Bilateral upper and lower extremity sensation is intact to light touch.    Gait is normal.   No difficulty with tandem gait.   No evidence of dysmetria noted.  Medical Decision Making  Imaging: ***  I have personally reviewed the images and agree with the above interpretation.  Assessment and Plan: Ms. Amann is a pleasant 62 y.o. female with ***    Thank you for involving me in the care of this patient.   I spent a total of *** minutes in both face-to-face and non-face-to-face activities for this visit on the date of this encounter.   [facility]

## 2023-05-10 ENCOUNTER — Encounter: Payer: Self-pay | Admitting: Neurosurgery

## 2023-05-10 ENCOUNTER — Ambulatory Visit (INDEPENDENT_AMBULATORY_CARE_PROVIDER_SITE_OTHER): Payer: Medicaid Other | Admitting: Neurosurgery

## 2023-05-10 VITALS — BP 122/76 | Ht 60.0 in | Wt 210.6 lb

## 2023-05-10 DIAGNOSIS — G8929 Other chronic pain: Secondary | ICD-10-CM | POA: Diagnosis not present

## 2023-05-10 DIAGNOSIS — M5441 Lumbago with sciatica, right side: Secondary | ICD-10-CM | POA: Diagnosis not present

## 2023-05-10 DIAGNOSIS — M5416 Radiculopathy, lumbar region: Secondary | ICD-10-CM

## 2023-06-06 ENCOUNTER — Ambulatory Visit (AMBULATORY_SURGERY_CENTER): Payer: Medicaid Other | Admitting: Gastroenterology

## 2023-06-06 ENCOUNTER — Encounter: Payer: Self-pay | Admitting: Gastroenterology

## 2023-06-06 VITALS — BP 147/84 | HR 63 | Temp 97.3°F | Resp 17 | Ht 60.0 in | Wt 210.0 lb

## 2023-06-06 DIAGNOSIS — Z8719 Personal history of other diseases of the digestive system: Secondary | ICD-10-CM | POA: Diagnosis present

## 2023-06-06 DIAGNOSIS — D175 Benign lipomatous neoplasm of intra-abdominal organs: Secondary | ICD-10-CM | POA: Diagnosis not present

## 2023-06-06 DIAGNOSIS — K635 Polyp of colon: Secondary | ICD-10-CM | POA: Diagnosis not present

## 2023-06-06 DIAGNOSIS — D123 Benign neoplasm of transverse colon: Secondary | ICD-10-CM

## 2023-06-06 MED ORDER — SODIUM CHLORIDE 0.9 % IV SOLN
500.0000 mL | Freq: Once | INTRAVENOUS | Status: DC
Start: 2023-06-06 — End: 2023-06-06

## 2023-06-06 NOTE — Progress Notes (Signed)
[facility] History and Physical   Primary Care Physician:  Patient, No Pcp Per   Reason for Procedure:   Follow up diverticulitis  Plan:    Colonoscopy     HPI: Amanda Christensen is a 62 y.o. female colonoscopy following an episode of uncomplicated sigmoid diverticulitis in February.  She has no chronic GI symptoms. She has multiple family members on her mother's side with colon cancer, but no first degree relatives.  She had a colonoscopy in 2014 with a hyperplastic polyp removed.   Past Medical History:  Diagnosis Date   Arthritis    Asthma    Chronic pain    COPD (chronic obstructive pulmonary disease) (HCC)    Depression    history of    DVT (deep venous thrombosis) (HCC)    h/o 4 blood clots after knee replacement RLE   GERD (gastroesophageal reflux disease)    Heart murmur    High cholesterol    History of anxiety    "used to have anxiety attacks"   History of bronchitis    Hypertension    Insomnia    takes Trazodone nightly   Obesity    Peripheral vascular disease (HCC) 10   rt leg dvt    Seizures (HCC) 03   was on medications none now off  since 2011   Shortness of breath dyspnea     Past Surgical History:  Procedure Laterality Date   BREAST EXCISIONAL BIOPSY Left 09/12/2016   BREAST LUMPECTOMY WITH NEEDLE LOCALIZATION Left 09/13/2016   Procedure: LEFT BREAST LUMPECTOMY WITH NEEDLE LOCALIZATION;  Surgeon: Berna Bue, MD;  Location: [facility] OR;  Service: General;  Laterality: Left;   CESAREAN SECTION     COLONOSCOPY     DILATION AND CURETTAGE OF UTERUS     HERNIA REPAIR     umbilical   JOINT REPLACEMENT     miscarriage     REVISION TOTAL SHOULDER TO REVERSE TOTAL SHOULDER Left 08/27/2019   Procedure: REVISION TOTAL SHOULDER TO REVERSE TOTAL SHOULDER;  Surgeon: Bjorn Pippin, MD;  Location: [facility];  Service: Orthopedics;  Laterality: Left;   ROTATOR CUFF REPAIR Left    SHOULDER ARTHROSCOPY Left 05/15/2019   Procedure: SHOULDER ARTHROSCOPY  WITH MANIPULATION AND LYSIS OF ADHESIONS;  Surgeon: Bjorn Pippin, MD;  Location: [facility];  Service: Orthopedics;  Laterality: Left;   SYNOVIAL BIOPSY Left 05/15/2019   Procedure: SYNOVIAL BIOPSY;  Surgeon: Bjorn Pippin, MD;  Location: [facility];  Service: Orthopedics;  Laterality: Left;   TOTAL KNEE ARTHROPLASTY Right    TOTAL KNEE ARTHROPLASTY Left 09/27/2016   Procedure: LEFT TOTAL KNEE ARTHROPLASTY;  Surgeon: Loreta Ave, MD;  Location: [facility] OR;  Service: Orthopedics;  Laterality: Left;   TOTAL SHOULDER ARTHROPLASTY Left 01/08/2015   dr Ranell Patrick   TOTAL SHOULDER ARTHROPLASTY Left 01/08/2015   Procedure: LEFT TOTAL SHOULDER ARTHROPLASTY;  Surgeon: Verlee Rossetti, MD;  Location: [facility] OR;  Service: Orthopedics;  Laterality: Left;   TUBAL LIGATION      Prior to Admission medications   Medication Sig Start Date End Date Taking? Authorizing Provider  albuterol (PROVENTIL HFA;VENTOLIN HFA) 108 (90 BASE) MCG/ACT inhaler Inhale 1-2 puffs into the lungs every 6 (six) hours as needed for wheezing or shortness of breath. 12/15/14  Yes Tilden Fossa, MD  Fluticasone-Salmeterol (ADVAIR) 250-50 MCG/DOSE AEPB Inhale 1 puff into the lungs daily.    Yes [provider]  HM LIDOCAINE PATCH EX Apply topically.  Yes [provider]  lidocaine (HM LIDOCAINE PATCH) 4 % Place 1 patch onto the skin daily.   Yes [provider]  metoprolol succinate (TOPROL-XL) 50 MG 24 hr tablet Take 50 mg by mouth daily. Take with or immediately following a meal.   Yes [provider]  montelukast (SINGULAIR) 10 MG tablet Take 10 mg by mouth at bedtime.   Yes [provider]  traMADol (ULTRAM) 50 MG tablet Take 1 tablet (50 mg total) by mouth every 6 (six) hours as needed. 03/22/23  Yes Arby Barrette, MD  traZODone (DESYREL) 100 MG tablet Take 100 mg by mouth at bedtime.   Yes [provider]  triamterene-hydrochlorothiazide (MAXZIDE-25)  37.5-25 MG tablet Take 1 tablet by mouth daily.   Yes [provider]  albuterol (PROVENTIL) (2.5 MG/3ML) 0.083% nebulizer solution Inhale 2.5 mg into the lungs every 6 (six) hours as needed for wheezing or shortness of breath.  05/15/19   [provider]    Current Outpatient Medications  Medication Sig Dispense Refill   albuterol (PROVENTIL HFA;VENTOLIN HFA) 108 (90 BASE) MCG/ACT inhaler Inhale 1-2 puffs into the lungs every 6 (six) hours as needed for wheezing or shortness of breath. 1 Inhaler 0   Fluticasone-Salmeterol (ADVAIR) 250-50 MCG/DOSE AEPB Inhale 1 puff into the lungs daily.      HM LIDOCAINE PATCH EX Apply topically.     lidocaine (HM LIDOCAINE PATCH) 4 % Place 1 patch onto the skin daily.     metoprolol succinate (TOPROL-XL) 50 MG 24 hr tablet Take 50 mg by mouth daily. Take with or immediately following a meal.     montelukast (SINGULAIR) 10 MG tablet Take 10 mg by mouth at bedtime.     traMADol (ULTRAM) 50 MG tablet Take 1 tablet (50 mg total) by mouth every 6 (six) hours as needed. 20 tablet 0   traZODone (DESYREL) 100 MG tablet Take 100 mg by mouth at bedtime.     triamterene-hydrochlorothiazide (MAXZIDE-25) 37.5-25 MG tablet Take 1 tablet by mouth daily.     albuterol (PROVENTIL) (2.5 MG/3ML) 0.083% nebulizer solution Inhale 2.5 mg into the lungs every 6 (six) hours as needed for wheezing or shortness of breath.      Current Facility-Administered Medications  Medication Dose Route Frequency Provider Last Rate Last Admin   0.9 %  sodium chloride infusion  500 mL Intravenous Once Jenel Lucks, MD        Allergies as of 06/06/2023 - Review Complete 06/06/2023  Allergen Reaction Noted   Penicillins Itching and Other (See Comments) 01/07/2012   Oxycodone Itching 09/05/2016    Family History  Problem Relation Age of Onset   Cancer Father    Prostate cancer Father    Breast cancer Sister    Hypertension Other        family history   Cancer Other         family history   Drug abuse Other        family history   Other Other        family history of psychiatric problems, disabilities   Liver disease Neg Hx    Esophageal cancer Neg Hx     Social History   Socioeconomic History   Marital status: Divorced    Spouse name: Not on file   Number of children: 3   Years of education: Not on file   Highest education level: Not on file  Occupational History   Occupation: disabled  Tobacco Use  Smoking status: Former    Current packs/day: 0.00    Average packs/day: 0.5 packs/day for 17.0 years (8.5 ttl pk-yrs)    Types: Cigarettes    Start date: 11/21/1995    Quit date: 11/20/2012    Years since quitting: 10.5   Smokeless tobacco: Former  Building services engineer status: Never Used  Substance and Sexual Activity   Alcohol use: No   Drug use: Not Currently   Sexual activity: Yes    Birth control/protection: Surgical  Other Topics Concern   Not on file  Social History Narrative   Not on file   Social Determinants of Health   Financial Resource Strain: Not on file  Food Insecurity: Not on file  Transportation Needs: Not on file  Physical Activity: Not on file  Stress: Not on file  Social Connections: Not on file  Intimate Partner Violence: Unknown (10/10/2022)   [facility]   Interpersonal Safety Screen    Are you in a relationship where you have been physically or emotionally hurt or threatened?: Not on file    Review of Systems:  All other review of systems negative except as mentioned in the HPI.  Physical Exam: Vital signs BP (!) 139/101 (BP Location: Left Arm, Patient Position: Sitting, Cuff Size: Normal) Comment: pt very nervous/ takes her bp at night/ states hungry headache  Pulse 68   Temp (!) 97.3 F (36.3 C) (Temporal)   Ht 5' (1.524 m)   Wt 210 lb (95.3 kg)   SpO2 100%   BMI 41.01 kg/m   General:   Alert,  Well-developed,  well-nourished, pleasant and cooperative in NAD Airway:  Mallampati 1 Lungs:  Clear throughout to auscultation.   Heart:  Regular rate and rhythm; no murmurs, clicks, rubs,  or gallops. Abdomen:  Soft, nontender and nondistended. Normal bowel sounds.   Neuro/Psych:  Normal mood and affect. A and O x 3   Gracen Southwell E. Tomasa Rand, MD [facility]

## 2023-06-06 NOTE — Progress Notes (Signed)
 Called to room to assist during endoscopic procedure.  Patient ID and intended procedure confirmed with present staff. Received instructions for my participation in the procedure from the performing physician.  

## 2023-06-06 NOTE — Op Note (Signed)
[facility] Patient Name: Amanda Christensen Procedure Date: 06/06/2023 1:10 PM MRN: [id number] Endoscopist: Lorin Picket E. Tomasa Rand , MD, [id number] Age: 62 Referring MD:  Date of Birth: [birth year] Gender: Female Account #: [id number] Procedure:                Colonoscopy Indications:              Follow-up of diverticulitis Medicines:                Monitored Anesthesia Care Procedure:                Pre-Anesthesia Assessment:                           - Prior to the procedure, a History and Physical                            was performed, and patient medications and                            allergies were reviewed. The patient's tolerance of                            previous anesthesia was also reviewed. The risks                            and benefits of the procedure and the sedation                            options and risks were discussed with the patient.                            All questions were answered, and informed consent                            was obtained. Prior Anticoagulants: The patient has                            taken no anticoagulant or antiplatelet agents. ASA                            Grade Assessment: III - A patient with severe                            systemic disease. After reviewing the risks and                            benefits, the patient was deemed in satisfactory                            condition to undergo the procedure.                           After obtaining informed consent, the colonoscope  was passed under direct vision. Throughout the                            procedure, the patient's blood pressure, pulse, and                            oxygen saturations were monitored continuously. The                            Olympus Scope [id number] was introduced through the                            anus and advanced to the the terminal ileum, with                            identification of  the appendiceal orifice and IC                            valve. The colonoscopy was performed without                            difficulty. The patient tolerated the procedure                            well. The quality of the bowel preparation was                            adequate. The terminal ileum, ileocecal valve,                            appendiceal orifice, and rectum were photographed.                            The bowel preparation used was Miralax via split                            dose instruction. Scope In: 1:31:38 PM Scope Out: 1:48:14 PM Scope Withdrawal Time: 0 hours 13 minutes 28 seconds  Total Procedure Duration: 0 hours 16 minutes 36 seconds  Findings:                 The perianal and digital rectal examinations were                            normal. Pertinent negatives include normal                            sphincter tone and no palpable rectal lesions.                           A 5 mm polyp was found in the splenic flexure. The                            polyp was sessile. The polyp was removed with  a                            cold snare. Resection and retrieval were complete.                            Estimated blood loss was minimal.                           There was a large lipoma, 30 mm in diameter, in the                            sigmoid colon. Biopsies were taken with a cold                            forceps for histology. Estimated blood loss was                            minimal.                           Many small-mouthed diverticula were found in the                            sigmoid colon and descending colon. There was no                            evidence of diverticular bleeding.                           The exam was otherwise normal throughout the                            examined colon.                           The terminal ileum appeared normal.                           The retroflexed view of the distal rectum and anal                             verge was normal and showed no anal or rectal                            abnormalities. Complications:            No immediate complications. Estimated Blood Loss:     Estimated blood loss was minimal. Impression:               - One 5 mm polyp at the splenic flexure, removed                            with a cold snare. Resected and retrieved.                           -  Large lipoma in the sigmoid colon. Biopsied.                           - Moderate diverticulosis in the sigmoid colon and                            in the descending colon. There was no evidence of                            diverticular bleeding.                           - The examined portion of the ileum was normal.                           - The distal rectum and anal verge are normal on                            retroflexion view. Recommendation:           - Patient has a contact number available for                            emergencies. The signs and symptoms of potential                            delayed complications were discussed with the                            patient. Return to normal activities tomorrow.                            Written discharge instructions were provided to the                            patient.                           - Resume previous diet.                           - Continue present medications.                           - Await pathology results.                           - Repeat colonoscopy (date not yet determined) for                            surveillance based on pathology results. Talana Slatten E. Tomasa Rand, MD 06/06/2023 2:00:24 PM This report has been signed electronically.

## 2023-06-06 NOTE — Patient Instructions (Signed)
Discharge instructions given. Handouts on polyps and diverticulosis. Resume previous medications. YOU HAD AN ENDOSCOPIC PROCEDURE TODAY AT THE [facility]:   Refer to the procedure report that was given to you for any specific questions about what was found during the examination.  If the procedure report does not answer your questions, please call your gastroenterologist to clarify.  If you requested that your care partner not be given the details of your procedure findings, then the procedure report has been included in a sealed envelope for you to review at your convenience later.  YOU SHOULD EXPECT: Some feelings of bloating in the abdomen. Passage of more gas than usual.  Walking can help get rid of the air that was put into your GI tract during the procedure and reduce the bloating. If you had a lower endoscopy (such as a colonoscopy or flexible sigmoidoscopy) you may notice spotting of blood in your stool or on the toilet paper. If you underwent a bowel prep for your procedure, you may not have a normal bowel movement for a few days.  Please Note:  You might notice some irritation and congestion in your nose or some drainage.  This is from the oxygen used during your procedure.  There is no need for concern and it should clear up in a day or so.  SYMPTOMS TO REPORT IMMEDIATELY:  Following lower endoscopy (colonoscopy or flexible sigmoidoscopy):  Excessive amounts of blood in the stool  Significant tenderness or worsening of abdominal pains  Swelling of the abdomen that is new, acute  Fever of 100F or higher   For urgent or emergent issues, a gastroenterologist can be reached at any hour by calling [phone]. Do not use MyChart messaging for urgent concerns.    DIET:  We do recommend a small meal at first, but then you may proceed to your regular diet.  Drink plenty of fluids but you should avoid alcoholic beverages for 24 hours.  ACTIVITY:  You should plan to take it  easy for the rest of today and you should NOT DRIVE or use heavy machinery until tomorrow (because of the sedation medicines used during the test).    FOLLOW UP: Our staff will call the number listed on your records the next business day following your procedure.  We will call around 7:15- 8:00 am to check on you and address any questions or concerns that you may have regarding the information given to you following your procedure. If we do not reach you, we will leave a message.     If any biopsies were taken you will be contacted by phone or by letter within the next 1-3 weeks.  Please call us at [phone] if you have not heard about the biopsies in 3 weeks.    SIGNATURES/CONFIDENTIALITY: You and/or your care partner have signed paperwork which will be entered into your electronic medical record.  These signatures attest to the fact that that the information above on your After Visit Summary has been reviewed and is understood.  Full responsibility of the confidentiality of this discharge information lies with you and/or your care-partner. 

## 2023-06-06 NOTE — Progress Notes (Signed)
Uneventful propofol anesthetic. Report to pacu rn. Vss. Care resumed by rn.

## 2023-06-06 NOTE — Progress Notes (Signed)
Vitals-Amanda Christensen  Pt's states no medical or surgical changes since previsit or office visit. 

## 2023-06-07 ENCOUNTER — Telehealth: Payer: Self-pay | Admitting: *Deleted

## 2023-06-07 NOTE — Telephone Encounter (Signed)
  Follow up Call-     06/06/2023   12:53 PM 06/06/2023   12:49 PM  Call back number  Post procedure Call Back phone  # [phone]   Permission to leave phone message  Yes     Patient questions:  Do you have a fever, pain , or abdominal swelling? No. Pain Score  0 *  Have you tolerated food without any problems? Yes.    Have you been able to return to your normal activities? Yes.    Do you have any questions about your discharge instructions: Diet   No. Medications  No. Follow up visit  No.  Do you have questions or concerns about your Care? No.  Actions: * If pain score is 4 or above: No action needed, pain <4.

## 2023-06-13 ENCOUNTER — Encounter: Payer: Self-pay | Admitting: Gastroenterology

## 2023-06-27 ENCOUNTER — Ambulatory Visit
Admission: RE | Admit: 2023-06-27 | Discharge: 2023-06-27 | Disposition: A | Discharge status: Home / Self Care | Payer: Medicaid Other | Source: Ambulatory Visit | Attending: Neurosurgery | Admitting: Neurosurgery

## 2023-06-27 DIAGNOSIS — M5416 Radiculopathy, lumbar region: Secondary | ICD-10-CM

## 2023-06-27 DIAGNOSIS — G8929 Other chronic pain: Secondary | ICD-10-CM

## 2023-08-02 ENCOUNTER — Ambulatory Visit (INDEPENDENT_AMBULATORY_CARE_PROVIDER_SITE_OTHER): Payer: Medicaid Other | Admitting: Neurosurgery

## 2023-08-02 DIAGNOSIS — M4807 Spinal stenosis, lumbosacral region: Secondary | ICD-10-CM

## 2023-08-02 DIAGNOSIS — M5441 Lumbago with sciatica, right side: Secondary | ICD-10-CM

## 2023-08-02 DIAGNOSIS — G8929 Other chronic pain: Secondary | ICD-10-CM | POA: Diagnosis not present

## 2023-08-02 DIAGNOSIS — M5416 Radiculopathy, lumbar region: Secondary | ICD-10-CM

## 2023-08-02 NOTE — Progress Notes (Signed)
Neurosurgery Telephone (Audio-Only) Note  Requesting Provider     No referring provider defined for this encounter. T: N/A F:   Primary Care Provider Patient, No Pcp Per No address on file T: None F: None  Telehealth visit was conducted with Amanda Christensen, a 62 y.o. female via telephone.  History of Present Illness: Amanda Christensen is a 62 y.o presenting today via telephone visit to review her lumbar MRI and PT records. She denies any significant changes to her symptoms.   05/10/23 Amanda Christensen is a 62 y.o with a history of DVT after knee replacement, hypertension, obesity, hyperlipidemia, SVT, osteoarthritis,  bilateral knee replacements, and left shoulder surgery and chronic lumbosacral complaints here today with a chief complaint of acute on chronic bilateral low back pain and right leg radiating leg pain.  She states that she has had this for about 10 years but is worsened over the last year or so.  In 2019 she had multiple lumbar injections with Dr. Murray Hodgkins at [facility] neurosurgery and spine.  In 2020 she moved to [place] and stopped treatment and Dr. Murray Hodgkins has since retired.  She was utilizing a TENS unit during this time which did provide some relief. Today she describes pain that starts in her bilateral low back worse on the right than the left with radiating pain down her right buttock and hip into the posterior lateral aspect of her right leg and anterior shin.  She is unable to tell if it travels into her foot as she does have a history of bunion and this causes pain in her right foot.  She denies any left radiating leg symptoms.  She states the pain is worse with standing for prolonged periods of time, sitting for prolonged periods of time, or laying down for prolonged periods of time causing her to have to switch positions frequently.  She is currently taking tramadol which does provide some relief.  She describes the pain as sharp and stabbing and denies any numbness or  tingling.   Conservative measures:  Physical therapy:  may 2024 at [facility] which was generalized PT Multimodal medical therapy including regular antiinflammatories:  ibuprofen, tramadol  Injections: has not had any recent lumbar injections.   Past Surgery: Denies any previous lumbar surgeries   KITTEN BAEDER has no symptoms of cervical myelopathy.   The symptoms are causing a significant impact on the patient's life.   General Review of Systems:  A ROS was performed including pertinent positive and negatives as documented.  All other systems are negative.   Prior to Admission medications   Medication Sig Start Date End Date Taking? Authorizing Provider  albuterol (PROVENTIL HFA;VENTOLIN HFA) 108 (90 BASE) MCG/ACT inhaler Inhale 1-2 puffs into the lungs every 6 (six) hours as needed for wheezing or shortness of breath. 12/15/14   Tilden Fossa, MD  albuterol (PROVENTIL) (2.5 MG/3ML) 0.083% nebulizer solution Inhale 2.5 mg into the lungs every 6 (six) hours as needed for wheezing or shortness of breath.  05/15/19   [provider]  Fluticasone-Salmeterol (ADVAIR) 250-50 MCG/DOSE AEPB Inhale 1 puff into the lungs daily.     [provider]  HM LIDOCAINE PATCH EX Apply topically.    [provider]  lidocaine (HM LIDOCAINE PATCH) 4 % Place 1 patch onto the skin daily.    [provider]  metoprolol succinate (TOPROL-XL) 50 MG 24 hr tablet Take 50 mg by mouth daily. Take with or immediately following a meal.  [provider]  montelukast (SINGULAIR) 10 MG tablet Take 10 mg by mouth at bedtime.    [provider]  traMADol (ULTRAM) 50 MG tablet Take 1 tablet (50 mg total) by mouth every 6 (six) hours as needed. 03/22/23   Arby Barrette, MD  traZODone (DESYREL) 100 MG tablet Take 100 mg by mouth at bedtime.    [provider]  triamterene-hydrochlorothiazide (MAXZIDE-25) 37.5-25 MG tablet Take 1 tablet by mouth daily.    [provider]    DATA REVIEWED    Imaging Studies  06/27/23 MRI L spine FINDINGS: Segmentation:  Standard.   Alignment:  Mild L4-5 anterolisthesis.   Vertebrae:  No fracture, evidence of discitis, or bone lesion.   Conus medullaris and cauda equina: Conus extends to the L1-2 level. Conus and cauda equina appear normal.   Paraspinal and other soft tissues: Negative for perispinal mass or inflammation   Disc levels:   L2-L3: Mild degenerative facet spurring.   L3-L4: Mild disc bulging and mild-to-moderate facet spurring which is greater on the right. No neural impingement   L4-L5: Moderate to bulky degenerative facet spurring with mild anterolisthesis. Mild height loss and bulging.   L5-S1:Disc narrowing and bulging with endplate degeneration. Degenerative facet spurring on both sides. Mild to moderate left foraminal narrowing which is progressed.   IMPRESSION: 1. Disc and facet degeneration mainly at L3-4 and below with mild L4-5 anterolisthesis. Degeneration has progressed from 2018, especially at the L5-S1 disc space where there is now mild to moderate left foraminal narrowing. Widely patent spinal canal.     Electronically Signed   By: Tiburcio Pea M.D.   On: 07/10/2023 19:42     IMPRESSION  Amanda Christensen is a 62 y.o. female who I performed a telephone encounter today for evaluation and management of acute on chronic lumbosacral complaints.  PLAN  Amanda Christensen is a 62 y.o with a long standing history of back and leg pain. Her MRI does show some mild facet sees particularly at L3-4 and mild to moderate foraminal stenosis at L5-S1 however her complaints are predominantly right-sided.  I recommended attempting further physical therapy and consideration of epidural steroid injections.  She would like to have this done at [facility] in [place]. It is unclear via their website if they have a provider that offers this. I will reach out to them to see if they have a provider  who can see her for injections.  No orders of the defined types were placed in this encounter.   DISPOSITION  Follow up: In person appointment in  PRN   Susanne Borders, PA   TELEPHONE DOCUMENTATION   This visit was performed via telephone.  Patient location: home Provider location: office  I spent a total of 5 minutes non-face-to-face activities for this visit on the date of this encounter including review of current clinical condition and response to treatment.  The patient is aware of and accepts the limits of this telehealth visit.

## 2023-08-20 ENCOUNTER — Encounter: Payer: Medicaid Other | Admitting: Obstetrics and Gynecology

## 2023-08-21 ENCOUNTER — Ambulatory Visit: Payer: Medicaid Other | Admitting: Orthopaedic Surgery

## 2023-08-22 ENCOUNTER — Telehealth: Payer: Self-pay | Admitting: Neurosurgery

## 2023-08-22 DIAGNOSIS — G8929 Other chronic pain: Secondary | ICD-10-CM

## 2023-08-22 DIAGNOSIS — M5416 Radiculopathy, lumbar region: Secondary | ICD-10-CM

## 2023-08-22 NOTE — Telephone Encounter (Signed)
Referral has been placed, please send.

## 2023-08-22 NOTE — Telephone Encounter (Signed)
Referral faxed by Flonnie Hailstone.

## 2023-08-22 NOTE — Telephone Encounter (Signed)
[facility] [place] has called to follow up about the patient regarding having injections done. They are not able to do the injections needed for her at that facility but she would like a referral sent to [facility] in [place] to have those injections done. Phone number: [phone]

## 2023-09-04 ENCOUNTER — Encounter: Payer: Self-pay | Admitting: Obstetrics and Gynecology

## 2023-09-04 ENCOUNTER — Ambulatory Visit (INDEPENDENT_AMBULATORY_CARE_PROVIDER_SITE_OTHER): Payer: Medicaid Other | Admitting: Obstetrics and Gynecology

## 2023-09-04 VITALS — BP 132/87 | HR 89 | Ht 60.0 in | Wt 210.9 lb

## 2023-09-04 DIAGNOSIS — Z0000 Encounter for general adult medical examination without abnormal findings: Secondary | ICD-10-CM | POA: Insufficient documentation

## 2023-09-04 NOTE — Progress Notes (Signed)
   ANNUAL EXAM Patient name: Amanda Christensen MRN [id number]  Date of birth: January 02, 1961 Chief Complaint:   Annual Exam  History of Present Illness:   Amanda Christensen is a 62 y.o. No obstetric history on file. African-American female being seen today for a routine annual exam.   Current complaints: No concerns today. Is not currently sexually active. Denies any urinary concerns, bowel movement changes, issues with pelvic pain.  No LMP recorded. Patient is postmenopausal.  Last pap 03/20/22. Results were: NILM w/ HRHPV negative. H/O abnormal pap: no Last mammogram: 11/27/22. Results were: normal. Family h/o breast cancer: no Last colonoscopy: 06/05/20. Results were: abnormal with polyp . Follows with GI. Family h/o colorectal cancer: no     10/15/2019    9:30 AM 07/14/2019    2:44 PM  Depression screen PHQ 2/9  Decreased Interest 0 0  Down, Depressed, Hopeless 0 0  PHQ - 2 Score 0 0         No data to display           Review of Systems:   Pertinent items are noted in HPI Denies any headaches, blurred vision, fatigue, shortness of breath, chest pain, abdominal pain, abnormal vaginal discharge/itching/odor/irritation, problems with periods, bowel movements, urination, or intercourse unless otherwise stated above. Pertinent History Reviewed:  Reviewed past medical,surgical, social and family history.  Reviewed problem list, medications and allergies. Physical Assessment:   Vitals:   09/04/23 1316  BP: 132/87  Pulse: 89  Weight: 210 lb 14.4 oz (95.7 kg)  Height: 5' (1.524 m)  Body mass index is 41.19 kg/m.        Physical Examination:   General appearance - well appearing, and in no distress  Mental status - alert, oriented to person, place, and time  Psych:  She has a normal mood and affect  Skin - warm and dry, normal color, no suspicious lesions noted  Chest - effort normal, all lung fields clear to auscultation bilaterally  Heart - normal rate and regular rhythm  Neck:   midline trachea, no thyromegaly or nodules  Breasts - breasts appear normal, no suspicious masses, no skin or nipple changes or  axillary nodes  Abdomen - soft, nontender, nondistended, no masses or organomegaly   Extremities:  No swelling or varicosities noted  No results found for this or any previous visit (from the past 24 hour(s)).  Assessment & Plan:  1) Well-Woman Exam: UTD on screenings at this time. Encouraged seasonal vaccines. Discussed ASCCP guidelines for pap smears. Patient would like to defer a pap smear this year. Discussed need for DEXA to assess bone density at age 59. Follows closely with PCP.  Labs/procedures today: None indicated  Mammogram:  next due 1/25 , or sooner if problems Colonoscopy: per GI, or sooner if problems DEXA: start at 62 yo  No orders of the defined types were placed in this encounter.   Meds: No orders of the defined types were placed in this encounter.   Follow-up: No follow-ups on file.  Joanne Gavel, MD 09/04/2023 2:13 PM

## 2023-09-06 ENCOUNTER — Encounter: Payer: Self-pay | Admitting: Physical Medicine & Rehabilitation

## 2023-09-27 NOTE — Progress Notes (Signed)
Subjective:    Patient ID: Amanda Christensen, female    DOB: 05-Jul-1961, 62 y.o.   MRN: [id number]  HPI  CC:  Low back pain  62 yo female with ~10+ yr hx of low back pain .  Pt notes insidious onset has seen several specialists in the past including Dr Riley Kill at this clinic in 2009.  Had one injection by Dr Stevphen Rochester at this clinic in 2009.  Has subsequently been followed by Dr Joaquim Nam at [facility].  Pt moved to [place] and came back to [place] ~93mo ago.  No Spine injections x several years .  Has had repeat MRI 07/10/23 which was independently reviewed .  THis demonstrated L2-S1 severe facet hypertrophy but no sig foraminal or central canal stenosis  No history of dye allergy or iodine allergy. Not on anticoagulants Recently diagnosed with diabetes   RIGHT KNEE - COMPLETE 4 VIEW   COMPARISON:  Right knee radiographs dated 10/18/2017   FINDINGS: Status post right knee arthroplasty. Hardware appears intact and well seated, similar to 10/18/2017. No abnormal surrounding lucency. No acute fracture or dislocation. No definite joint effusion.   IMPRESSION: Status post right knee arthroplasty without evidence of hardware complication. No acute fracture or dislocation.     Electronically Signed   By: Agustin Cree M.D.   On: 03/22/2023 09:45  MRI LUMBAR SPINE WITHOUT CONTRAST   TECHNIQUE: Multiplanar, multisequence MR imaging of the lumbar spine was performed. No intravenous contrast was administered.   COMPARISON:  06/27/2017   FINDINGS: Segmentation:  Standard.   Alignment:  Mild L4-5 anterolisthesis.   Vertebrae:  No fracture, evidence of discitis, or bone lesion.   Conus medullaris and cauda equina: Conus extends to the L1-2 level. Conus and cauda equina appear normal.   Paraspinal and other soft tissues: Negative for perispinal mass or inflammation   Disc levels:   L2-L3: Mild degenerative facet spurring.   L3-L4: Mild disc bulging and  mild-to-moderate facet spurring which is greater on the right. No neural impingement   L4-L5: Moderate to bulky degenerative facet spurring with mild anterolisthesis. Mild height loss and bulging.   L5-S1:Disc narrowing and bulging with endplate degeneration. Degenerative facet spurring on both sides. Mild to moderate left foraminal narrowing which is progressed.   IMPRESSION: 1. Disc and facet degeneration mainly at L3-4 and below with mild L4-5 anterolisthesis. Degeneration has progressed from 2018, especially at the L5-S1 disc space where there is now mild to moderate left foraminal narrowing. Widely patent spinal canal.     Electronically Signed   By: Tiburcio Pea M.D.   On: 07/10/2023 19:42    Pain Inventory Average Pain 8 Pain Right Now 8 My pain is sharp, dull, stabbing, and aching  In the last 24 hours, has pain interfered with the following? General activity 1 Relation with others 3 Enjoyment of life 2 What TIME of day is your pain at its worst? evening Sleep (in general) Fair  Pain is worse with: walking, bending, sitting, standing, and some activites Pain improves with: rest, heat/ice, medication, TENS, and injections Relief from Meds: 0  walk without assistance how many minutes can you walk? 5-10 ability to climb steps?  no do you drive?  yes  disabled: date disabled . I need assistance with the following:  meal prep and household duties  bowel control problems weakness trouble walking spasms  New pt  New pt    Family History  Problem Relation Age of Onset  Cancer Father    Prostate cancer Father    Breast cancer Sister    Hypertension Other        family history   Cancer Other        family history   Drug abuse Other        family history   Other Other        family history of psychiatric problems, disabilities   Liver disease Neg Hx    Esophageal cancer Neg Hx    Social History   Socioeconomic History   Marital status:  Divorced    Spouse name: Not on file   Number of children: 3   Years of education: Not on file   Highest education level: Not on file  Occupational History   Occupation: disabled  Tobacco Use   Smoking status: Former    Current packs/day: 0.00    Average packs/day: 0.5 packs/day for 17.0 years (8.5 ttl pk-yrs)    Types: Cigarettes    Start date: 11/21/1995    Quit date: 11/20/2012    Years since quitting: 10.8   Smokeless tobacco: Former  Building services engineer status: Never Used  Substance and Sexual Activity   Alcohol use: No   Drug use: Not Currently   Sexual activity: Yes    Birth control/protection: Surgical  Other Topics Concern   Not on file  Social History Narrative   Not on file   Social Determinants of Health   Financial Resource Strain: Not on file  Food Insecurity: Not on file  Transportation Needs: Not on file  Physical Activity: Not on file  Stress: Not on file  Social Connections: Not on file   Past Surgical History:  Procedure Laterality Date   BREAST EXCISIONAL BIOPSY Left 09/12/2016   BREAST LUMPECTOMY WITH NEEDLE LOCALIZATION Left 09/13/2016   Procedure: LEFT BREAST LUMPECTOMY WITH NEEDLE LOCALIZATION;  Surgeon: Berna Bue, MD;  Location: [facility] OR;  Service: General;  Laterality: Left;   CESAREAN SECTION     COLONOSCOPY     DILATION AND CURETTAGE OF UTERUS     HERNIA REPAIR     umbilical   JOINT REPLACEMENT     miscarriage     REVISION TOTAL SHOULDER TO REVERSE TOTAL SHOULDER Left 08/27/2019   Procedure: REVISION TOTAL SHOULDER TO REVERSE TOTAL SHOULDER;  Surgeon: Bjorn Pippin, MD;  Location: [facility] ORS;  Service: Orthopedics;  Laterality: Left;   ROTATOR CUFF REPAIR Left    SHOULDER ARTHROSCOPY Left 05/15/2019   Procedure: SHOULDER ARTHROSCOPY WITH MANIPULATION AND LYSIS OF ADHESIONS;  Surgeon: Bjorn Pippin, MD;  Location: [facility];  Service: Orthopedics;  Laterality: Left;   SYNOVIAL BIOPSY Left 05/15/2019   Procedure: SYNOVIAL  BIOPSY;  Surgeon: Bjorn Pippin, MD;  Location: [facility];  Service: Orthopedics;  Laterality: Left;   TOTAL KNEE ARTHROPLASTY Right    TOTAL KNEE ARTHROPLASTY Left 09/27/2016   Procedure: LEFT TOTAL KNEE ARTHROPLASTY;  Surgeon: Loreta Ave, MD;  Location: [facility] OR;  Service: Orthopedics;  Laterality: Left;   TOTAL SHOULDER ARTHROPLASTY Left 01/08/2015   dr Ranell Patrick   TOTAL SHOULDER ARTHROPLASTY Left 01/08/2015   Procedure: LEFT TOTAL SHOULDER ARTHROPLASTY;  Surgeon: Verlee Rossetti, MD;  Location: [facility] OR;  Service: Orthopedics;  Laterality: Left;   TUBAL LIGATION     Past Medical History:  Diagnosis Date   Arthritis    Asthma    Chronic pain    COPD (chronic obstructive pulmonary  disease) (HCC)    Depression    history of    DVT (deep venous thrombosis) (HCC)    h/o 4 blood clots after knee replacement RLE   GERD (gastroesophageal reflux disease)    Heart murmur    High cholesterol    History of anxiety    "used to have anxiety attacks"   History of bronchitis    Hypertension    Insomnia    takes Trazodone nightly   Obesity    Peripheral vascular disease (HCC) 10   rt leg dvt    Seizures (HCC) 03   was on medications none now off  since 2011   Shortness of breath dyspnea    There were no vitals taken for this visit.  Opioid Risk Score:   Fall Risk Score:  `1  Depression screen [facility] 2/9     10/15/2019    9:30 AM 07/14/2019    2:44 PM  Depression screen PHQ 2/9  Decreased Interest 0 0  Down, Depressed, Hopeless 0 0  PHQ - 2 Score 0 0    Review of Systems  Constitutional:  Positive for unexpected weight change.  Gastrointestinal:  Positive for constipation.  Musculoskeletal:  Positive for back pain and gait problem.       Left shoulder pain Right knee pain  All other systems reviewed and are negative.     Objective:   Physical Exam Vitals and nursing note reviewed.  Constitutional:      Appearance: She is obese.  HENT:     Head: Normocephalic  and atraumatic.  Eyes:     Extraocular Movements: Extraocular movements intact.     Conjunctiva/sclera: Conjunctivae normal.     Pupils: Pupils are equal, round, and reactive to light.  Musculoskeletal:     Comments: Tenderness in lumbar paraspinals centered at L3-4 and L4-5 region No PSIS tenderness Primary pain is above L5 region Right knee has some tenderness along the lateral and medial joint lines. Decreased right hip internal/external rotation range of motion Left hip range of motion is normal.  No pain with range of motion of the knees. Ambulates without device but has antalgic gait  Neurological:     General: No focal deficit present.     Mental Status: She is alert and oriented to person, place, and time.     Cranial Nerves: No dysarthria or facial asymmetry.     Sensory: Sensation is intact.     Motor: No weakness, tremor, atrophy or abnormal muscle tone.     Coordination: Coordination is intact.     Deep Tendon Reflexes:     Reflex Scores:      Patellar reflexes are 1+ on the right side and 1+ on the left side.      Achilles reflexes are 2+ on the right side and 2+ on the left side.    Comments: Negative straight leg raise bilateral Motor strength is 5/5 bilateral hip flexor knee extensor ankle dorsiflexor Sensation intact pinprick bilateral L2-3-4 5 post 1 dermatomal distribution  Psychiatric:        Mood and Affect: Mood normal.        Behavior: Behavior normal.           Assessment & Plan:  1.  Chronic lumbar axial low back pain.  Pain complaints are centered at the L3-4 and L4-5 paraspinal region bilaterally.  This corresponds with her lumbar facet hypertrophy noted on lumbar MRI from several months ago. She has no sciatic symptomatology which  also corresponds with lack of foraminal or central canal stenosis on her recent lumbar MRI. She is also ready tried physical therapy and medication management Previously had good results from lumbar fluoroscopy guided  injections. Will schedule for bilateral L2-3 4 medial branch blocks To block pain from the L3-4 and L4-5 levels.  If patient has 80% relief on a temporary basis following the medial branch block would repeat Bilateral L2-3-4 MBB under fluoro guidance   We discussed the spine procedure how it may have differed from other procedures she has had in the past.  We discussed the purpose of utilizing test blocks in order to evaluate for radiofrequency neurotomy as a longer term pain relief procedure.  Preprocedure form was completed.

## 2023-09-28 ENCOUNTER — Encounter: Payer: Medicaid Other | Attending: Physical Medicine & Rehabilitation | Admitting: Physical Medicine & Rehabilitation

## 2023-09-28 ENCOUNTER — Encounter: Payer: Self-pay | Admitting: Physical Medicine & Rehabilitation

## 2023-09-28 VITALS — BP 123/83 | HR 64 | Ht 60.0 in | Wt 208.0 lb

## 2023-09-28 DIAGNOSIS — M47817 Spondylosis without myelopathy or radiculopathy, lumbosacral region: Secondary | ICD-10-CM | POA: Insufficient documentation

## 2023-09-28 NOTE — Patient Instructions (Signed)
Arthritis is likely cause of low back pain will do test injections to confirm location

## 2023-10-05 ENCOUNTER — Encounter: Payer: Medicaid Other | Admitting: Obstetrics and Gynecology

## 2023-10-24 ENCOUNTER — Telehealth: Payer: Self-pay | Admitting: *Deleted

## 2023-10-24 MED ORDER — DIAZEPAM 10 MG PO TABS: 10.0000 mg | ORAL_TABLET | Freq: Once | ORAL | 0 refills | Status: AC

## 2023-10-24 NOTE — Telephone Encounter (Signed)
Received a call from [facility] stating that her order for her pre med will need to be faxed to them (#[phone]) so that the PCP/Pharmacy can fill it and have it ready for the patient. She has appt for 11/29/23 for a MBB. Order printed and faxed to the pharmacy number given.

## 2023-10-31 ENCOUNTER — Ambulatory Visit (INDEPENDENT_AMBULATORY_CARE_PROVIDER_SITE_OTHER): Payer: Medicaid Other | Admitting: Gastroenterology

## 2023-10-31 ENCOUNTER — Encounter: Payer: Self-pay | Admitting: Gastroenterology

## 2023-10-31 ENCOUNTER — Telehealth: Payer: Self-pay | Admitting: Gastroenterology

## 2023-10-31 VITALS — BP 118/74 | HR 65 | Ht 60.0 in | Wt 202.0 lb

## 2023-10-31 DIAGNOSIS — D175 Benign lipomatous neoplasm of intra-abdominal organs: Secondary | ICD-10-CM | POA: Insufficient documentation

## 2023-10-31 DIAGNOSIS — K5900 Constipation, unspecified: Secondary | ICD-10-CM | POA: Insufficient documentation

## 2023-10-31 NOTE — Telephone Encounter (Signed)
PT is calling to give an update on the information discussed during the OV. Requesting call back

## 2023-10-31 NOTE — Patient Instructions (Signed)
We have given you samples of the following medication to take: Linzess 72 mcg daily before breakfast.   Call office in 7-10 days with an update on how Linzess is working, ask for [employer].   _______________________________________________________  If your blood pressure at your visit was 140/90 or greater, please contact your primary care physician to follow up on this.  _______________________________________________________  If you are age 64 or older, your body mass index should be between 23-30. Your Body mass index is 39.45 kg/m. If this is out of the aforementioned range listed, please consider follow up with your Primary Care Provider.  If you are age 41 or younger, your body mass index should be between 19-25. Your Body mass index is 39.45 kg/m. If this is out of the aformentioned range listed, please consider follow up with your Primary Care Provider.   ________________________________________________________  [facility] providers would like to encourage you to use [facility] to communicate with providers for non-urgent requests or questions.  Due to long hold times on the telephone, sending your provider a message by [facility] may be a faster and more efficient way to get a response.  Please allow 48 business hours for a response.  Please remember that this is for non-urgent requests.  _______________________________________________________

## 2023-10-31 NOTE — Telephone Encounter (Signed)
Noted  

## 2023-10-31 NOTE — Progress Notes (Addendum)
10/31/2023 Amanda Christensen [id number] [birth year]   HISTORY OF PRESENT ILLNESS: This is a 62 year old female who is a patient Dr. Milas Hock.  She has history of diverticulitis that was treated earlier this year.  Also has chronic constipation.  Is here for follow-up of her colonoscopy in July as listed below.  Colonoscopy 05/2023:  - One 5 mm polyp at the splenic flexure, removed with a cold snare. Resected and retrieved. 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 Biopsied. - Moderate diverticulosis in the sigmoid colon and in the descending colon. There was no evidence of diverticular bleeding. - The examined portion of the ileum was normal. - The distal rectum and anal verge are normal on retroflexion view.  1. Surgical [P], colon, splenic flexure, polyp (1) - TUBULAR ADENOMA. - NO HIGH GRADE DYSPLASIA OR MALIGNANCY. 2. Surgical [P], colon, sigmoid nodule biopsies - POLYPOID FRAGMENT OF BENIGN COLONIC MUCOSA WITH MILD HYPERPLASTIC CHANGE  She is questioning the finding of a lipoma in her colon.  Dr. Tomasa Rand said no further evaluation or intervention needed.  She says that she had a colonoscopy in [place] in 2021 and believes that it was smaller at that time.  We do not have those records.  She was questioning about having it removed.  Otherwise she does have some chronic constipation.  Tried MiraLAX in the past without good results.   Past Medical History:  Diagnosis Date   Arthritis    Asthma    Chronic pain    COPD (chronic obstructive pulmonary disease) (HCC)    Depression    history of    DVT (deep venous thrombosis) (HCC)    h/o 4 blood clots after knee replacement RLE   GERD (gastroesophageal reflux disease)    Heart murmur    High cholesterol    History of anxiety    "used to have anxiety attacks"   History of bronchitis    Hypertension    Insomnia    takes Trazodone nightly   Obesity    Peripheral vascular disease (HCC) 10   rt leg dvt    Seizures (HCC) 03    was on medications none now off  since 2011   Shortness of breath dyspnea    Past Surgical History:  Procedure Laterality Date   BREAST EXCISIONAL BIOPSY Left 09/12/2016   BREAST LUMPECTOMY WITH NEEDLE LOCALIZATION Left 09/13/2016   Procedure: LEFT BREAST LUMPECTOMY WITH NEEDLE LOCALIZATION;  Surgeon: Berna Bue, MD;  Location: [facility];  Service: General;  Laterality: Left;   CESAREAN SECTION     COLONOSCOPY     DILATION AND CURETTAGE OF UTERUS     HERNIA REPAIR     umbilical   JOINT REPLACEMENT     miscarriage     REVISION TOTAL SHOULDER TO REVERSE TOTAL SHOULDER Left 08/27/2019   Procedure: REVISION TOTAL SHOULDER TO REVERSE TOTAL SHOULDER;  Surgeon: Bjorn Pippin, MD;  Location: [facility];  Service: Orthopedics;  Laterality: Left;   ROTATOR CUFF REPAIR Left    SHOULDER ARTHROSCOPY Left 05/15/2019   Procedure: SHOULDER ARTHROSCOPY WITH MANIPULATION AND LYSIS OF ADHESIONS;  Surgeon: Bjorn Pippin, MD;  Location: [facility];  Service: Orthopedics;  Laterality: Left;   SYNOVIAL BIOPSY Left 05/15/2019   Procedure: SYNOVIAL BIOPSY;  Surgeon: Bjorn Pippin, MD;  Location: [facility];  Service: Orthopedics;  Laterality: Left;   TOTAL KNEE ARTHROPLASTY Right    TOTAL KNEE ARTHROPLASTY Left 09/27/2016   Procedure: LEFT  TOTAL KNEE ARTHROPLASTY;  Surgeon: Loreta Ave, MD;  Location: [facility] OR;  Service: Orthopedics;  Laterality: Left;   TOTAL SHOULDER ARTHROPLASTY Left 01/08/2015   dr Ranell Patrick   TOTAL SHOULDER ARTHROPLASTY Left 01/08/2015   Procedure: LEFT TOTAL SHOULDER ARTHROPLASTY;  Surgeon: Verlee Rossetti, MD;  Location: [facility] OR;  Service: Orthopedics;  Laterality: Left;   TUBAL LIGATION      reports that she quit smoking about 10 years ago. Her smoking use included cigarettes. She started smoking about 27 years ago. She has a 8.5 pack-year smoking history. She has quit using smokeless tobacco. She reports that she does not currently use drugs. She reports that she  does not drink alcohol. family history includes Breast cancer in her sister; Cancer in her father and another family member; Drug abuse in an other family member; Hypertension in an other family member; Other in an other family member; Prostate cancer in her father. Allergies  Allergen Reactions   Penicillins Itching and Other (See Comments)        Oxycodone Itching      Outpatient Encounter Medications as of 10/31/2023  Medication Sig   albuterol (PROVENTIL HFA;VENTOLIN HFA) 108 (90 BASE) MCG/ACT inhaler Inhale 1-2 puffs into the lungs every 6 (six) hours as needed for wheezing or shortness of breath.   albuterol (PROVENTIL) (2.5 MG/3ML) 0.083% nebulizer solution Inhale 2.5 mg into the lungs every 6 (six) hours as needed for wheezing or shortness of breath.    atorvastatin (LIPITOR) 20 MG tablet Take 20 mg by mouth daily. Take one tablet by mouth once daily   Cholecalciferol (VITAMIN D-3 PO) Take 400 Units by mouth daily. Take one tablet by mouth once daily   Fluticasone-Salmeterol (ADVAIR) 250-50 MCG/DOSE AEPB Inhale 1 puff into the lungs daily.    lidocaine (HM LIDOCAINE PATCH) 4 % Place 1 patch onto the skin daily.   metoprolol succinate (TOPROL-XL) 50 MG 24 hr tablet Take 50 mg by mouth daily. Take with or immediately following a meal.   montelukast (SINGULAIR) 10 MG tablet Take 10 mg by mouth at bedtime.   Semaglutide,0.25 or 0.5MG /DOS, (OZEMPIC, 0.25 OR 0.5 MG/DOSE,) 2 MG/1.5ML SOPN Inject into the skin.   traZODone (DESYREL) 100 MG tablet Take 100 mg by mouth at bedtime.   [DISCONTINUED] HM LIDOCAINE PATCH EX Apply topically.   [DISCONTINUED] loratadine (CLARITIN) 5 MG chewable tablet Chew 5 mg by mouth daily.   [DISCONTINUED] traMADol (ULTRAM) 50 MG tablet Take 1 tablet (50 mg total) by mouth every 6 (six) hours as needed.   No facility-administered encounter medications on file as of 10/31/2023.    REVIEW OF SYSTEMS  : All other systems reviewed and negative except where noted  in the History of Present Illness.   PHYSICAL EXAM: BP 118/74   Pulse 65   Ht 5' (1.524 m)   Wt 202 lb (91.6 kg)   BMI 39.45 kg/m  General: Well developed female in no acute distress Head: Normocephalic and atraumatic Eyes:  Sclerae anicteric, conjunctiva pink. Ears: Normal auditory acuity Lungs: Clear throughout to auscultation; no W/R/R. Heart: Regular rate and rhythm; no M/R/G. Abdomen: Soft, non-distended.  BS present.  Non-tender. Musculoskeletal: Symmetrical with no gross deformities  Skin: No lesions on visible extremities Extremities: No edema  Neurological: Alert oriented x 4, grossly non-focal Psychological:  Alert and cooperative. Normal mood and affect  ASSESSMENT AND PLAN: *Lipoma of the colon: Per Dr. Milas Hock note, no further intervention or surveillance needed for this.  This is  benign.  She says that she believes that it has grown since her colonoscopy in 2021 in [place].  We do not have those records.  We will have her sign to try to obtain those records. *Constipation has tried MiraLAX in the past without good results.  Will try Linzess 72 mcg daily.  Samples given.  She will call us with an update in about 10 days at which time we can send a prescription for the same dose or titrate the dose up if needed.  **Addendum: Colonoscopy received from [place] November 2021 that showed a 2 cm lipoma in the sigmoid colon, diverticulosis in the sigmoid colon, and small internal hemorrhoids.  Biopsy of the sigmoid colon subepithelial nodule was benign with mild hyperplastic changes.  Upper endoscopy also November 2021 showed a completely normal exam.  Gastric biopsies showed no abnormalities, negative for H. pylori.  CC:  No ref. provider found

## 2023-10-31 NOTE — Telephone Encounter (Signed)
Garrison Columbus MD  [facility]  [address]  [place], [place]  [phone]

## 2023-10-31 NOTE — Telephone Encounter (Signed)
Patient called and stated that she needed to speak to [facility] because she needed to provider Patty with some information. Please advise.

## 2023-11-06 NOTE — Progress Notes (Addendum)
Agree with the assessment and plan as outlined by Doug Sou, PA-C.  Even if the lipoma appears to have increased in size, there is still not a clear indication for removal, as there is essentially no malignant potential for lipomas.  If the lipoma was thought to be causing symptoms, then resection may be considered.  If the patient would like to talk with a surgeon for their opinion, we can certainly place a referral.  Taja Pentland E. Tomasa Rand, MD [facility]

## 2023-11-16 ENCOUNTER — Ambulatory Visit: Payer: Medicaid Other | Admitting: Physical Medicine & Rehabilitation

## 2023-11-19 ENCOUNTER — Other Ambulatory Visit: Payer: Self-pay | Admitting: Internal Medicine

## 2023-11-19 DIAGNOSIS — Z1231 Encounter for screening mammogram for malignant neoplasm of breast: Secondary | ICD-10-CM

## 2023-11-29 ENCOUNTER — Encounter: Payer: Self-pay | Admitting: Physical Medicine & Rehabilitation

## 2023-11-29 ENCOUNTER — Encounter: Payer: Medicaid Other | Attending: Physical Medicine & Rehabilitation | Admitting: Physical Medicine & Rehabilitation

## 2023-11-29 VITALS — BP 124/81 | HR 65 | Temp 97.9°F | Ht 60.0 in | Wt 202.0 lb

## 2023-11-29 DIAGNOSIS — M47817 Spondylosis without myelopathy or radiculopathy, lumbosacral region: Secondary | ICD-10-CM | POA: Insufficient documentation

## 2023-11-29 MED ORDER — LIDOCAINE HCL (PF) 2 % IJ SOLN
2.0000 mL | Freq: Once | INTRAMUSCULAR | Status: AC
Start: 2023-11-29 — End: 2023-11-29
  Administered 2023-11-29: 2 mL

## 2023-11-29 MED ORDER — LIDOCAINE HCL 1 % IJ SOLN
5.0000 mL | Freq: Once | INTRAMUSCULAR | Status: AC
Start: 2023-11-29 — End: 2023-11-29
  Administered 2023-11-29: 5 mL

## 2023-11-29 MED ORDER — IOHEXOL 180 MG/ML  SOLN
3.0000 mL | Freq: Once | INTRAMUSCULAR | Status: AC
  Administered 2023-11-29: 3 mL via INTRAVENOUS

## 2023-11-29 NOTE — Progress Notes (Signed)
 Bilateral Lumbar L2, L3, L4  medial branch blocks  injection under fluoroscopic guidance  Indication: Lumbar pain which is not relieved by medication management or other conservative care and interfering with self-care and mobility.  Informed consent was obtained after describing risks and benefits of the procedure with the patient, this includes bleeding, infection, paralysis and medication side effects.  The patient wishes to proceed and has given written consent.  The patient was placed in prone position.  The lumbar area was marked and prepped with Betadine.  One mL of 1% lidocaine  was injected into each of 6 areas into the skin and subcutaneous tissue.  Then a 22-gauge 3.5 in spinal needle was inserted targeting  the left L5 superior articular process in transverse process junction was targeted.  Bone contact was made. Omnipaque  180 was injected x 0.5 mL demonstrating no intravascular uptake. Then a solution containing  2% MPF lidocaine  was injected x 0.5 mL.  Then the left L4 superior articular process in transverse process junction was targeted.  Bone contact was made. Omnipaque  180 was injected x 0.5 mL demonstrating no intravascular uptake.  Then a solution containing2% MPF lidocaine  was injected x 0.5 mL.  This same procedure was performed on the right side using the same needle, technique and injectate.  hen the left L3 superior articular process in transverse process junction was targeted.  Bone contact was made. Omnipaque  180 was injected x 0.5 mL demonstrating no intravascular uptake.  Then a solution containing2% MPF lidocaine  was injected x 0.5 mL.  This same procedure was performed on the right side using the same needle, technique and injectatePatient tolerated procedure well.  Post procedure instructions were given.  Pre inj pain 5/10 Post inj pain 0/10

## 2023-11-29 NOTE — Progress Notes (Signed)
  PROCEDURE RECORD [facility]   Name: Amanda Christensen DOB:May 26, 1961 MRN: [id number]  Date:11/29/2023  Physician: Prentice Compton, MD    Nurse/CMA: Royal RMA  Allergies:  Allergies  Allergen Reactions   Penicillins Itching and Other (See Comments)        Oxycodone  Itching    Consent Signed: Yes.    Is patient diabetic? No.  CBG today? N/A  Pregnant: No. LMP: No LMP recorded. Patient is postmenopausal. (age 65-55)  Anticoagulants: no Anti-inflammatory: no Antibiotics: no  Procedure: Medial Branch Block  Position: Prone Start Time: 10:49 AM  End Time: 11:02 AM  Fluoro Time: 1:07  RN/CMA Letecia Arps MA Royal RMA    Time 9:56 AM 11:08 AM    BP 124/81 139/91    Pulse 65 67    Respirations 16 16    O2 Sat 99 100    S/S 6 6    Pain Level 5/10 0/10     D/C home with Transportation  Pace , patient A & O X 3, D/C instructions reviewed, and sits independently.

## 2023-11-29 NOTE — Patient Instructions (Signed)

## 2023-12-03 ENCOUNTER — Ambulatory Visit: Payer: Medicaid Other | Admitting: Dermatology

## 2023-12-14 ENCOUNTER — Telehealth: Payer: Self-pay | Admitting: *Deleted

## 2023-12-14 NOTE — Telephone Encounter (Signed)
-----   Message from [place] D. Zehr sent at 12/12/2023  9:25 AM EST ----- Please let the patient know that we finally received her records from [place].  On her colonoscopy November 2021 it did show that the lipoma was 2 cm in size.  Most recent colonoscopy here showed that it was 3 cm in size.  Dr. Tomasa Rand agreed, that there is no indication for surgical resection of this even though it has grown slightly, but if she wants to speak to a surgeon to get their opinion then we can definitely refer her to have that discussion.

## 2023-12-20 NOTE — Telephone Encounter (Signed)
Informed patient of below and she will hold off on surgical referral for now.

## 2023-12-26 ENCOUNTER — Ambulatory Visit
Admission: RE | Admit: 2023-12-26 | Discharge: 2023-12-26 | Disposition: A | Discharge status: Home / Self Care | Payer: Medicaid Other | Source: Ambulatory Visit | Attending: Internal Medicine | Admitting: Internal Medicine

## 2023-12-26 DIAGNOSIS — Z1231 Encounter for screening mammogram for malignant neoplasm of breast: Secondary | ICD-10-CM

## 2024-01-01 ENCOUNTER — Encounter: Payer: Self-pay | Admitting: Physical Medicine & Rehabilitation

## 2024-01-22 ENCOUNTER — Ambulatory Visit: Admitting: Physical Medicine & Rehabilitation

## 2024-02-11 ENCOUNTER — Telehealth: Payer: Self-pay | Admitting: Physical Medicine & Rehabilitation

## 2024-02-11 NOTE — Telephone Encounter (Signed)
 Pt called requesting procedure. She canceled last appointment for Bilateral L2-3-4 MBB on 01/01/24 because she was not hurting. She is now having pain in lower back both sides. Can we reschedule procedure for Bilateral L2-3-4 MBB?

## 2024-02-15 ENCOUNTER — Other Ambulatory Visit (INDEPENDENT_AMBULATORY_CARE_PROVIDER_SITE_OTHER): Payer: Self-pay

## 2024-02-15 ENCOUNTER — Ambulatory Visit (INDEPENDENT_AMBULATORY_CARE_PROVIDER_SITE_OTHER): Admitting: Physician Assistant

## 2024-02-15 ENCOUNTER — Encounter: Payer: Self-pay | Admitting: Physician Assistant

## 2024-02-15 DIAGNOSIS — G8929 Other chronic pain: Secondary | ICD-10-CM

## 2024-02-15 DIAGNOSIS — M25561 Pain in right knee: Secondary | ICD-10-CM | POA: Diagnosis not present

## 2024-02-15 MED ORDER — MELOXICAM 7.5 MG PO TABS: 7.5000 mg | ORAL_TABLET | Freq: Every day | ORAL | 0 refills | Status: AC

## 2024-02-15 NOTE — Progress Notes (Signed)
 Office Visit Note   Patient: Amanda Christensen           Date of Birth: [birth year]           MRN: [id number] Visit Date: 02/15/2024              Requested by: Bufford Spikes L, DO [address],  [place] [address] PCP: Patient, No Pcp Per   Assessment & Plan: Visit Diagnoses: Right knee pain  Plan: Patient is a 63 year old woman who is status post right total knee arthroplasty several years ago done by [facility] she then subsequently in 2017 had a left total knee arthroplasty done by [facility].  With regards to her left knee she is doing fairly well.  She said she is always had problems with the right.  Some of this stems from what she described as a postoperative DVT which caused her to miss a lot of physical therapy.  She then from her description had to undergo a manipulation under anesthesia was treated with a CPM machine.  Since then she feels like she has pain especially over the anterior knee and that she feels the patellofemoral area rubs.  She does not have any sign of infection her x-rays overall look okay.  I do recommend that she try some physical therapy and follow-up with Dr. Magnus Ivan afterwards no concerns for an infective process at this time  Follow-Up Instructions: Return in about 1 month (around 03/17/2024).   Orders:  Orders Placed This Encounter  Procedures   XR KNEE 3 VIEW RIGHT   Ambulatory referral to Physical Therapy   Meds ordered this encounter  Medications   meloxicam (MOBIC) 7.5 MG tablet    Sig: Take 1 tablet (7.5 mg total) by mouth daily.    Dispense:  30 tablet    Refill:  0      Procedures: No procedures performed   Clinical Data: No additional findings.   Subjective: Chief Complaint  Patient presents with   Right Knee - Pain    Patient in today with pain in the right knee.  She had TKA back In 2012  she is having pain and swelling, she is having trouble walking and falling due to the swelling     HPI pleasant 63 year old  woman who comes in today for second opinion regarding her right knee.  She is status post right total knee arthroplasty several years ago by a provider at [facility] I believe.  She had postop complications including a DVT slow rehab and had to go to inpatient rehab for 30 days.  She has never gotten rid of the swelling in the knee.  She feels like she cannot pick the knee up and that her front of her knee sticks.  Denies any fever chills or infections.  She did have to subsequently undergo from her description of manipulation under anesthesia she is happy with the results of her left knee replacement  Review of Systems  All other systems reviewed and are negative.    Objective: Vital Signs: There were no vitals taken for this visit.  Physical Exam Constitutional:      Appearance: Normal appearance.  Pulmonary:     Effort: Pulmonary effort is normal.  Skin:    General: Skin is warm and dry.  Neurological:     General: No focal deficit present.     Mental Status: She is alert and oriented to person, place, and time.  Psychiatric:  Mood and Affect: Mood normal.        Behavior: Behavior normal.    Ortho Exam Examination of her right knee compartments are soft and nontender no evidence of infection no erythema and no palpable effusion.  She has a well-healed surgical incision.  She is neurovascular intact she has tenderness over the patellofemoral joint.  Some mild soft tissue swelling compared to her left knee Specialty Comments:  No specialty comments available.  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   Axillary adenopathy 06/16/2020   Elevated alkaline phosphatase level 04/30/2020   Prediabetes 04/30/2020   Allergic rhinitis 04/29/2020   Anxiety 04/29/2020   Chronic bilateral low back pain without sciatica 04/29/2020   Polyp of colon 04/29/2020   Medication monitoring encounter 09/11/2019   S/P reverse total shoulder arthroplasty, 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   Conductive hearing loss of right ear with unrestricted hearing of left ear 09/24/2018   Impacted cerumen of right ear 09/24/2018   Shoulder pain 10/16/2017   History of total knee arthroplasty 09/27/2016   S/P shoulder replacement 01/08/2015   Rhabdomyolysis 08/02/2014   Arthritis 08/02/2014   Hyperlipidemia 08/02/2014   Asthma 08/02/2014   COPD (chronic obstructive pulmonary disease) (HCC) 03/25/2014   Hypertension 03/25/2014   Chest tightness or pressure 03/25/2014   Supratherapeutic INR 03/25/2014   Osteoarthritis 03/25/2014   History of DVT (deep vein thrombosis) 03/25/2014   Past Medical History:  Diagnosis Date   Arthritis    Asthma    Chronic pain    COPD (chronic obstructive pulmonary disease) (HCC)    Depression    history of    DVT (deep venous thrombosis) (HCC)    h/o 4 blood clots after knee replacement RLE   GERD (gastroesophageal reflux disease)    Heart murmur    High cholesterol    History of anxiety    "used to have anxiety attacks"   History of bronchitis    Hypertension    Insomnia    takes Trazodone nightly   Obesity    Peripheral vascular disease (HCC) 10   rt leg dvt    Seizures (HCC) 03   was on medications none now off  since 2011   Shortness of breath dyspnea     Family History  Problem Relation Age of Onset   Cancer Father    Prostate cancer Father    Breast cancer Sister    Hypertension Other        family history    Cancer Other        family history   Drug abuse Other        family history   Other Other        family history of psychiatric problems, disabilities   Liver disease Neg Hx    Esophageal cancer Neg Hx     Past Surgical History:  Procedure Laterality Date   BREAST EXCISIONAL BIOPSY Left 09/12/2016   BREAST LUMPECTOMY WITH NEEDLE LOCALIZATION Left 09/13/2016   Procedure: LEFT  BREAST LUMPECTOMY WITH NEEDLE LOCALIZATION;  Surgeon: Berna Bue, MD;  Location: [facility];  Service: General;  Laterality: Left;   CESAREAN SECTION     COLONOSCOPY     DILATION AND CURETTAGE OF UTERUS     HERNIA REPAIR     umbilical   JOINT REPLACEMENT     miscarriage     REVISION TOTAL SHOULDER TO REVERSE TOTAL SHOULDER Left 08/27/2019   Procedure: REVISION TOTAL SHOULDER TO REVERSE TOTAL SHOULDER;  Surgeon: Bjorn Pippin, MD;  Location: [facility];  Service: Orthopedics;  Laterality: Left;   ROTATOR CUFF REPAIR Left    SHOULDER ARTHROSCOPY Left 05/15/2019   Procedure: SHOULDER ARTHROSCOPY WITH MANIPULATION AND LYSIS OF ADHESIONS;  Surgeon: Bjorn Pippin, MD;  Location: [facility];  Service: Orthopedics;  Laterality: Left;   SYNOVIAL BIOPSY Left 05/15/2019   Procedure: SYNOVIAL BIOPSY;  Surgeon: Bjorn Pippin, MD;  Location: [facility];  Service: Orthopedics;  Laterality: Left;   TOTAL KNEE ARTHROPLASTY Right    TOTAL KNEE ARTHROPLASTY Left 09/27/2016   Procedure: LEFT TOTAL KNEE ARTHROPLASTY;  Surgeon: Loreta Ave, MD;  Location[facility];  Service: Orthopedics;  Laterality: Left;   TOTAL SHOULDER ARTHROPLASTY Left 01/08/2015   dr Ranell Patrick   TOTAL SHOULDER ARTHROPLASTY Left 01/08/2015   Procedure: LEFT TOTAL SHOULDER ARTHROPLASTY;  Surgeon: Verlee Rossetti, MD;  Location: [facility] OR;  Service: Orthopedics;  Laterality: Left;   TUBAL LIGATION     Social History   Occupational History   Occupation: disabled  Tobacco Use   Smoking status: Former    Current packs/day: 0.00    Average  packs/day: 0.5 packs/day for 17.0 years (8.5 ttl pk-yrs)    Types: Cigarettes    Start date: 11/21/1995    Quit date: 11/20/2012    Years since quitting: 11.2   Smokeless tobacco: Former  Building services engineer status: Never Used  Substance and Sexual Activity   Alcohol use: No   Drug use: Not Currently   Sexual activity: Yes    Birth control/protection: Surgical

## 2024-02-26 NOTE — Therapy (Unsigned)
 OUTPATIENT PHYSICAL THERAPY LOWER EXTREMITY EVALUATION   Patient Name: Amanda Christensen MRN: [id number] DOB:[birth year], 63 y.o., female Today's Date: 02/27/2024  END OF SESSION:  PT End of Session - 02/27/24 1042     Visit Number 1    Number of Visits 12    Date for PT Re-Evaluation 04/28/24    Authorization Type [employer]    PT Start Time 1045    PT Stop Time 1130    PT Time Calculation (min) 45 min    Activity Tolerance Patient tolerated treatment well    Behavior During Therapy WFL for tasks assessed/performed             Past Medical History:  Diagnosis Date   Arthritis    Asthma    Chronic pain    COPD (chronic obstructive pulmonary disease) (HCC)    Depression    history of    DVT (deep venous thrombosis) (HCC)    h/o 4 blood clots after knee replacement RLE   GERD (gastroesophageal reflux disease)    Heart murmur    High cholesterol    History of anxiety    "used to have anxiety attacks"   History of bronchitis    Hypertension    Insomnia    takes Trazodone nightly   Obesity    Peripheral vascular disease (HCC) 10   rt leg dvt    Seizures (HCC) 03   was on medications none now off  since 2011   Shortness of breath dyspnea    Past Surgical History:  Procedure Laterality Date   BREAST EXCISIONAL BIOPSY Left 09/12/2016   BREAST LUMPECTOMY WITH NEEDLE LOCALIZATION Left 09/13/2016   Procedure: LEFT BREAST LUMPECTOMY WITH NEEDLE LOCALIZATION;  Surgeon: Berna Bue, MD;  Location: [facility] OR;  Service: General;  Laterality: Left;   CESAREAN SECTION     COLONOSCOPY     DILATION AND CURETTAGE OF UTERUS     HERNIA REPAIR     umbilical   JOINT REPLACEMENT     miscarriage     REVISION TOTAL SHOULDER TO REVERSE TOTAL SHOULDER Left 08/27/2019   Procedure: REVISION TOTAL SHOULDER TO REVERSE TOTAL SHOULDER;  Surgeon: Bjorn Pippin, MD;  Location: [facility];  Service: Orthopedics;  Laterality: Left;   ROTATOR CUFF REPAIR Left    SHOULDER ARTHROSCOPY Left 05/15/2019    Procedure: SHOULDER ARTHROSCOPY WITH MANIPULATION AND LYSIS OF ADHESIONS;  Surgeon: Bjorn Pippin, MD;  Location: [facility];  Service: Orthopedics;  Laterality: Left;   SYNOVIAL BIOPSY Left 05/15/2019   Procedure: SYNOVIAL BIOPSY;  Surgeon: Bjorn Pippin, MD;  Location:  SURGERY CENTER;  Service: Orthopedics;  Laterality: Left;   TOTAL KNEE ARTHROPLASTY Right    TOTAL KNEE ARTHROPLASTY Left 09/27/2016   Procedure: LEFT TOTAL KNEE ARTHROPLASTY;  Surgeon: Loreta Ave, MD;  Location: [facility] OR;  Service: Orthopedics;  Laterality: Left;   TOTAL SHOULDER ARTHROPLASTY Left 01/08/2015   dr Ranell Patrick   TOTAL SHOULDER ARTHROPLASTY Left 01/08/2015   Procedure: LEFT TOTAL SHOULDER ARTHROPLASTY;  Surgeon: Verlee Rossetti, MD;  Location: [facility] OR;  Service: Orthopedics;  Laterality: Left;   TUBAL LIGATION     Patient Active Problem List   Diagnosis Date Noted   Constipation 10/31/2023   Lipoma of colon 10/31/2023   Well woman exam without gynecological exam 09/04/2023   Hematuria 03/06/2023   Lumbar radiculitis 03/06/2023   SVT (supraventricular tachycardia) (HCC) 03/23/2021   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/23/2021  Axillary adenopathy 06/16/2020   Elevated alkaline phosphatase level 04/30/2020   Prediabetes 04/30/2020   Allergic rhinitis 04/29/2020   Anxiety 04/29/2020   Chronic bilateral low back pain without sciatica 04/29/2020   Polyp of colon 04/29/2020   Medication monitoring encounter 09/11/2019   S/P reverse total shoulder arthroplasty, 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   Conductive hearing loss of right ear with unrestricted hearing of left ear 09/24/2018   Impacted cerumen of right ear 09/24/2018   Shoulder pain 10/16/2017   History of total knee arthroplasty 09/27/2016   S/P shoulder replacement 01/08/2015   Rhabdomyolysis 08/02/2014   Arthritis 08/02/2014   Hyperlipidemia 08/02/2014   Asthma 08/02/2014    COPD (chronic obstructive pulmonary disease) (HCC) 03/25/2014   Hypertension 03/25/2014   Chest tightness or pressure 03/25/2014   Supratherapeutic INR 03/25/2014   Osteoarthritis 03/25/2014   History of DVT (deep vein thrombosis) 03/25/2014    PCP: Patient, No Pcp Per   REFERRING PROVIDER: Persons, [place], [place]  REFERRING DIAG: [id number] (ICD-10-CM) - Chronic pain of right knee  THERAPY DIAG:  Chronic pain of right knee  Muscle weakness (generalized)  Other abnormalities of gait and mobility  Rationale for Evaluation and Treatment: Rehabilitation  ONSET DATE: chronic  SUBJECTIVE:   SUBJECTIVE STATEMENT: HPI pleasant 63 year old woman who comes in today for second opinion regarding her right knee.  She is status post right total knee arthroplasty several years ago by a provider at [facility] I believe.  She had postop complications including a DVT slow rehab and had to go to inpatient rehab for 30 days.  She has never gotten rid of the swelling in the knee.  She feels like she cannot pick the knee up and that her front of her knee sticks.  Denies any fever chills or infections.  She did have to subsequently undergo from her description of manipulation under anesthesia she is happy with the results of her left knee replacement  PERTINENT HISTORY: Patient is a 63 year old woman who is status post right total knee arthroplasty several years ago done by [facility] she then subsequently in 2017 had a left total knee arthroplasty done by [facility]. With regards to her left knee she is doing fairly well. She said she is always had problems with the right. Some of this stems from what she described as a postoperative DVT which caused her to miss a lot of physical therapy. She then from her description had to undergo a manipulation under anesthesia was treated with a CPM machine. Since then she feels like she has pain especially over the anterior knee and that she feels the  patellofemoral area rubs. She does not have any sign of infection her x-rays overall look okay. I do recommend that she try some physical therapy and follow-up with Dr. Magnus Ivan afterwards no concerns for an infective process at this time PAIN:  Are you having pain? Yes: NPRS scale: 5/10 Pain location: R knee Pain description: ache Aggravating factors: prolonged standing and walking Relieving factors: medication, pain patches  PRECAUTIONS: None  RED FLAGS: None   WEIGHT BEARING RESTRICTIONS: No  FALLS:  Has patient fallen in last 6 months? No  OCCUPATION: disability  PLOF: Independent  PATIENT GOALS: To regain the use of my R knee  NEXT MD VISIT: TBD  OBJECTIVE:  Note: Objective measures were completed at Evaluation unless otherwise noted.  DIAGNOSTIC FINDINGS: Radiographs of her right knee status post right total knee arthroplasty no  evidence of  periprosthetic fracture or loosening   PATIENT SURVEYS:  LEFS 28/80  MUSCLE LENGTH: Hamstrings: Right 90 deg  POSTURE: No Significant postural limitations  PALPATION: Globally tender across anterior aspect of R knee  LOWER EXTREMITY ROM:  Active ROM Right eval Left eval  Hip flexion WFL   Hip extension [facility]   Hip abduction    Hip adduction    Hip internal rotation    Hip external rotation    Knee flexion 96d   Knee extension 0d   Ankle dorsiflexion    Ankle plantarflexion    Ankle inversion    Ankle eversion     (Blank rows = not tested)  LOWER EXTREMITY MMT:  MMT Right eval Left eval  Hip flexion 3+   Hip extension    Hip abduction 3+   Hip adduction    Hip internal rotation    Hip external rotation    Knee flexion 3+   Knee extension 3+   Ankle dorsiflexion    Ankle plantarflexion 3+   Ankle inversion    Ankle eversion     (Blank rows = not tested)  LOWER EXTREMITY SPECIAL TESTS:  deferred  FUNCTIONAL TESTS:  30 seconds chair stand test 5 reps arms crossed  GAIT: Distance walked:  21ftx2 Assistive device utilized: None Level of assistance: Complete Independence Comments: antalgic                                                                                                                                TREATMENT DATE:   [facility] Adult PT Treatment:                                                DATE: 02/27/24 Eval and HEP  Self Care: Additional minutes spent for educating on updated Therapeutic Home Exercise Program as well as comparing current status to condition at start of symptoms. This included exercises focusing on stretching, strengthening, with focus on eccentric aspects. Long term goals include an improvement in range of motion, strength, endurance as well as avoiding reinjury. Patient's frequency would include in 1-2 times a day, 3-5 times a week for a duration of 6-12 weeks. Proper technique shown and discussed handout in great detail. All questions were discussed and addressed.      PATIENT EDUCATION:  Education details: Discussed eval findings, rehab rationale and POC and patient is in agreement  Person educated: Patient Education method: Explanation Education comprehension: needs further education  HOME EXERCISE PROGRAM: Access Code: [email or web address] URL: [email or web address] Date: 02/27/2024 Prepared by: Gustavus Bryant  Exercises - Supine Bridge  - 2 x daily - 5 x weekly - 2 sets - 10 reps - Clamshell  - 2 x daily - 5 x weekly - 2 sets - 10 reps - Standing Heel Raise with Support  -  2 x daily - 5 x weekly - 2 sets - 10 reps - Sit to Stand with Arms Crossed  - 2 x daily - 5 x weekly - 1 sets - 5 reps  ASSESSMENT:  CLINICAL IMPRESSION: Patient is a 63 y.o. female who was seen today for physical therapy evaluation and treatment for chronic R knee pain following R TKA 2017.  Patient present with good AROM but strength deficits noted with 30s chair stand test.  Hypersensitivity oted across anterior aspect of knee but stable to varus/valgus  stress.  Patient is a good candidate for OPPT to address weakness in R knee and hip.  OBJECTIVE IMPAIRMENTS: Abnormal gait, decreased activity tolerance, decreased endurance, decreased knowledge of condition, decreased mobility, difficulty walking, decreased ROM, decreased strength, increased edema, improper body mechanics, obesity, and pain.   ACTIVITY LIMITATIONS: carrying, lifting, sitting, standing, squatting, stairs, transfers, and locomotion level  PERSONAL FACTORS: Age, Behavior pattern, Past/current experiences, and Time since onset of injury/illness/exacerbation are also affecting patient's functional outcome.   REHAB POTENTIAL: Good  CLINICAL DECISION MAKING: Stable/uncomplicated  EVALUATION COMPLEXITY: Low   GOALS: Goals reviewed with patient? No  SHORT TERM GOALS: Target date: 03/18/2024   Patient to demonstrate independence in HEP  Baseline: [id number] Goal status: INITIAL  2.  Assess 2 MWT Baseline: TBD Goal status: INITIAL   LONG TERM GOALS: Target date: 04/08/2024  Patient will score at least 48/80 on FOTO to signify clinically meaningful improvement in functional abilities.   Baseline: 28/80 Goal status: INITIAL  2.  Patient will acknowledge 2/10 pain at least once during episode of care   Baseline: 5//10 Goal status: INITIAL  3.  Patient will increase 30s chair stand reps from 5 to 8 with/without arms to demonstrate and improved functional ability with less pain/difficulty as well as reduce fall risk.  Baseline: 5 Goal status: INITIAL  4.  Re-assess 2 MWT Baseline: TBD Goal status: INITIAL  5.  4/5 RLE strength Baseline:  MMT Right eval Left eval  Hip flexion 3+   Hip extension    Hip abduction 3+   Hip adduction    Hip internal rotation    Hip external rotation    Knee flexion 3+   Knee extension 3+   Ankle dorsiflexion    Ankle plantarflexion 3+    Goal status: INITIAL   PLAN:  PT FREQUENCY: 2x/week  PT DURATION: 6 weeks  PLANNED  INTERVENTIONS: 97164- PT Re-evaluation, 97110-Therapeutic exercises, 97530- Therapeutic activity, 97112- Neuromuscular re-education, 97535- Self Care, 91478- Manual therapy, [phone]- Gait training, Patient/Family education, Balance training, Stair training, and DME instructions  PLAN FOR NEXT SESSION: HEP review and update, manual techniques as appropriate, aerobic tasks, ROM and flexibility activities, strengthening and PREs, TPDN, gait and balance training as needed    For all possible CPT codes, reference the Planned Interventions line above.     Check all conditions that are expected to impact treatment: {Conditions expected to impact treatment:None of these apply   If treatment provided at initial evaluation, no treatment charged due to lack of authorization.       Hildred Laser, PT 02/27/2024, 12:16 PM

## 2024-02-27 ENCOUNTER — Ambulatory Visit: Attending: Physician Assistant

## 2024-02-27 ENCOUNTER — Other Ambulatory Visit: Payer: Self-pay

## 2024-02-27 DIAGNOSIS — G8929 Other chronic pain: Secondary | ICD-10-CM | POA: Diagnosis present

## 2024-02-27 DIAGNOSIS — M6281 Muscle weakness (generalized): Secondary | ICD-10-CM | POA: Insufficient documentation

## 2024-02-27 DIAGNOSIS — M25561 Pain in right knee: Secondary | ICD-10-CM | POA: Insufficient documentation

## 2024-02-27 DIAGNOSIS — R2689 Other abnormalities of gait and mobility: Secondary | ICD-10-CM | POA: Insufficient documentation

## 2024-03-05 ENCOUNTER — Encounter

## 2024-03-06 ENCOUNTER — Encounter

## 2024-03-11 ENCOUNTER — Encounter: Admitting: Physical Therapy

## 2024-03-11 NOTE — Progress Notes (Unsigned)
  PROCEDURE RECORD [facility]   Name: Amanda Christensen DOB:June 04, 1961 MRN: [id number]  Date:03/11/2024  Physician: Janeece Mechanic, MD    Nurse/CMA: Zoanne Hinders MA  Allergies:  Allergies  Allergen Reactions   Penicillins Itching and Other (See Comments)        Oxycodone  Itching    Consent Signed: {yes VH:[id number]}  Is patient diabetic? {yes no:314532}  CBG today? ***  Pregnant: {yes no:314532} LMP: No LMP recorded. Patient is postmenopausal. (age 26-55)  Anticoagulants: {Yes/No:19989} Anti-inflammatory: {Yes/No:19989} Antibiotics: {Yes/No:19989}  Procedure: L2,3,4 Medial Branch Block   Position: Prone Start Time: ***  End Time: ***  Fluoro Time: ***  RN/CMA      Time      BP      Pulse      Respirations      O2 Sat      S/S      Pain Level       D/C home with ***, patient A & O X 3, D/C instructions reviewed, and sits independently.

## 2024-03-13 ENCOUNTER — Encounter: Attending: Physical Medicine & Rehabilitation | Admitting: Physical Medicine & Rehabilitation

## 2024-03-13 ENCOUNTER — Encounter

## 2024-03-13 ENCOUNTER — Encounter: Payer: Self-pay | Admitting: Physical Medicine & Rehabilitation

## 2024-03-13 VITALS — BP 101/71 | HR 82 | Temp 97.6°F | Ht 60.0 in | Wt 191.0 lb

## 2024-03-13 DIAGNOSIS — M47817 Spondylosis without myelopathy or radiculopathy, lumbosacral region: Secondary | ICD-10-CM | POA: Diagnosis present

## 2024-03-13 MED ORDER — LIDOCAINE HCL 1 % IJ SOLN
10.0000 mL | Freq: Once | INTRAMUSCULAR | Status: AC
Start: 2024-03-13 — End: 2024-03-13
  Administered 2024-03-13: 10 mL

## 2024-03-13 MED ORDER — LIDOCAINE HCL (PF) 2 % IJ SOLN
6.0000 mL | Freq: Once | INTRAMUSCULAR | Status: AC
Start: 2024-03-13 — End: 2024-03-13
  Administered 2024-03-13: 6 mL

## 2024-03-13 MED ORDER — IOHEXOL 180 MG/ML  SOLN
2.0000 mL | Freq: Once | INTRAMUSCULAR | Status: AC
  Administered 2024-03-13: 2 mL

## 2024-03-13 NOTE — Patient Instructions (Signed)

## 2024-03-13 NOTE — Progress Notes (Signed)
 Bilateral Lumbar L2, L3, L4  medial branch blocks  injection under fluoroscopic guidance  Indication: Lumbar pain which is not relieved by medication management or other conservative care and interfering with self-care and mobility.  Informed consent was obtained after describing risks and benefits of the procedure with the patient, this includes bleeding, infection, paralysis and medication side effects.  The patient wishes to proceed and has given written consent.  The patient was placed in prone position.  The lumbar area was marked and prepped with Betadine.  One mL of 1% lidocaine  was injected into each of 6 areas into the skin and subcutaneous tissue.  Then a 22-gauge 3.5 in spinal needle was inserted targeting  the left L5 superior articular process in transverse process junction was targeted.  Bone contact was made. Omnipaque  180 was injected x 0.5 mL demonstrating no intravascular uptake. Then a solution containing  2% MPF lidocaine  was injected x 0.5 mL.  Then the left L4 superior articular process in transverse process junction was targeted.  Bone contact was made. Omnipaque  180 was injected x 0.5 mL demonstrating no intravascular uptake.  Then a solution containing2% MPF lidocaine  was injected x 0.5 mL.  This same procedure was performed on the right side using the same needle, technique and injectate.  hen the left L3 superior articular process in transverse process junction was targeted.  Bone contact was made. Omnipaque  180 was injected x 0.5 mL demonstrating no intravascular uptake.  Then a solution containing2% MPF lidocaine  was injected x 0.5 mL.  This same procedure was performed on the right side using the same needle, technique and injectatePatient tolerated procedure well.  Post procedure instructions were given.  Pre inj pain 7/10 Post inj pain 0/10  Recommend left L2-L3-L4 medial branch radiofrequency neurotomy to be done after she gets back from being out of town

## 2024-03-17 ENCOUNTER — Ambulatory Visit: Admitting: Orthopaedic Surgery

## 2024-03-18 ENCOUNTER — Encounter: Admitting: Physical Therapy

## 2024-03-20 ENCOUNTER — Encounter: Admitting: Physical Therapy

## 2024-03-25 ENCOUNTER — Encounter: Admitting: Physical Therapy

## 2024-03-27 ENCOUNTER — Encounter

## 2024-04-01 ENCOUNTER — Encounter

## 2024-04-03 ENCOUNTER — Encounter: Admitting: Physical Therapy

## 2024-06-19 ENCOUNTER — Telehealth: Payer: Self-pay | Admitting: Physical Medicine & Rehabilitation

## 2024-06-19 MED ORDER — DIAZEPAM 5 MG PO TABS: ORAL_TABLET | ORAL | 0 refills | Status: DC

## 2024-06-19 NOTE — Telephone Encounter (Signed)
 Pt calling to request a prescription for a single dose of Valium  before her procedure on 08/7. She will need the prescription sent through [facility].

## 2024-06-19 NOTE — Telephone Encounter (Signed)
 I have printed Rx to be faxed to pharmacy [phone] per previous order 10/24/23 phone msg. There is no [employer], so it must be sent to the clinic to address. FAXED RX.

## 2024-06-23 NOTE — Progress Notes (Unsigned)
  PROCEDURE RECORD [place] [facility]   Name: Amanda Christensen DOB:1961-06-07 MRN: [id number]  Date:06/23/2024  Physician: Prentice Compton, MD    Nurse/CMA: [facility] RN  Allergies:  Allergies  Allergen Reactions   Penicillins Itching and Other (See Comments)        Oxycodone  Itching    Consent Signed: {yes wn:[id number]}  Is patient diabetic? {yes no:314532}  CBG today? ***  Pregnant: {yes no:314532} LMP: No LMP recorded. Patient is postmenopausal. (age 22-55)  Anticoagulants: {Yes/No:19989} Anti-inflammatory: {Yes/No:19989} Antibiotics: {Yes/No:19989}  Procedure: LEFT L3-4-5 RADIOFREQUENCY NEUROTOMY Position: Prone Start Time: ***  End Time: ***  Fluoro Time: ***  RN/CMA Maurion Walkowiak RN Shakti Fleer RN    Time *** ***    BP *** ***    Pulse *** ***    Respirations *** ***    O2 Sat *** ***    S/S *** ***    Pain Level *** ***     D/C home with ***, patient A & O X 3, D/C instructions reviewed, and sits independently.

## 2024-06-26 ENCOUNTER — Encounter: Attending: Physical Medicine & Rehabilitation | Admitting: Physical Medicine & Rehabilitation

## 2024-06-26 ENCOUNTER — Encounter: Payer: Self-pay | Admitting: Physical Medicine & Rehabilitation

## 2024-06-26 VITALS — BP 125/81 | HR 62 | Ht 60.0 in | Wt 183.0 lb

## 2024-06-26 DIAGNOSIS — M47817 Spondylosis without myelopathy or radiculopathy, lumbosacral region: Secondary | ICD-10-CM | POA: Diagnosis present

## 2024-06-26 MED ORDER — LIDOCAINE HCL (PF) 2 % IJ SOLN
3.0000 mL | Freq: Once | INTRAMUSCULAR | Status: AC
Start: 2024-06-26 — End: 2024-06-26
  Administered 2024-06-26: 3 mL

## 2024-06-26 MED ORDER — LIDOCAINE HCL 1 % IJ SOLN
10.0000 mL | Freq: Once | INTRAMUSCULAR | Status: AC
Start: 2024-06-26 — End: 2024-06-26
  Administered 2024-06-26: 10 mL

## 2024-06-26 NOTE — Patient Instructions (Signed)
You had a radio frequency procedure today This was done to alleviate joint pain in your lumbar area We injected lidocaine which is a local anesthetic.  You may experience soreness at the injection sites. You may also experienced some irritation of the nerves that were heated I'm recommending ice for 30 minutes every 2 hours as needed for the next 24-48 hours   

## 2024-06-26 NOTE — Progress Notes (Signed)
 Left L2, left L4 and left L3 medial branch radio frequency neurotomy under fluoroscopic guidance  Indication: Low back pain due to lumbar spondylosis which has been relieved on 2 occasions by greater than 50% by lumbar medial branch blocks at corresponding levels.  Informed consent was obtained after describing risks and benefits of the procedure with the patient, this includes bleeding, bruising, infection, paralysis and medication side effects. The patient wishes to proceed and has given written consent. The patient was placed in a prone position. The lumbar and sacral area was marked and prepped with Betadine. A 25-gauge 1-1/2 inch needle was inserted into the skin and subcutaneous tissue at 3 sites in one ML of 1% lidocaine  was injected into each site. Then a 18-gauge 10 cm radio frequency needle with a 1 cm curved active tip was inserted targeting the left L3 SAP/TP junction. Bone contact was made and confirmed with lateral imaging.  motor stimulation at 2 Hz confirm proper needle location followed by injection of one ML of 2% MPF lidocaine . Then the left L5 SAP/transverse process junction was targeted. Bone contact was made and confirmed with lateral imaging motor stimulation at 2 Hz confirm proper needle location followed by injection of one ML of the solution containing one ML of  2% MPF lidocaine . Then the left L4 SAP/transverse process junction was targeted. Bone contact was made and confirmed with lateral imaging. motor stimulation at 2 Hz confirm proper needle location followed by injection of one ML of the solution containing one ML of2% MPF lidocaine . Radio frequency lesion  at [facility] for 90 seconds was performed. Needles were removed. Post procedure instructions and vital signs were performed. Patient tolerated procedure well. Followup appointment was given.

## 2024-07-03 ENCOUNTER — Encounter: Payer: Self-pay | Admitting: Dermatology

## 2024-07-03 ENCOUNTER — Ambulatory Visit (INDEPENDENT_AMBULATORY_CARE_PROVIDER_SITE_OTHER): Payer: Medicaid Other | Admitting: Dermatology

## 2024-07-03 VITALS — BP 105/74

## 2024-07-03 DIAGNOSIS — L6681 Central centrifugal cicatricial alopecia: Secondary | ICD-10-CM | POA: Diagnosis not present

## 2024-07-03 DIAGNOSIS — L299 Pruritus, unspecified: Secondary | ICD-10-CM

## 2024-07-03 DIAGNOSIS — L309 Dermatitis, unspecified: Secondary | ICD-10-CM | POA: Diagnosis not present

## 2024-07-03 DIAGNOSIS — L658 Other specified nonscarring hair loss: Secondary | ICD-10-CM

## 2024-07-03 MED ORDER — CLOBETASOL PROPIONATE 0.05 % EX CREA: 1.0000 | TOPICAL_CREAM | Freq: Two times a day (BID) | CUTANEOUS | 3 refills | Status: DC

## 2024-07-03 MED ORDER — SAFETY SEAL MISCELLANEOUS MISC
5 refills | Status: AC
Start: 2024-07-03 — End: ?

## 2024-07-03 NOTE — Progress Notes (Signed)
 New Patient Visit   Subjective  Amanda Christensen is a 63 y.o. female who presents for a NEW PATIENT appointment to be examined for the concerns as listed below.  Rash: Presented 2 years on her back at the bra line while she resided in [place] . She stated it is itchy - rating it 7 out of 10. She was Rx a cream but she can not recall the name of it, however, she stated she only used it for a few weeks as she noticed that it assisted with itch but itching returned once she D/C the topical. She is not currently using anything for this issue.    Patient denied Hx of Bx. Patient denied family Hx of skin cancer.   The following portions of the chart were reviewed this encounter and updated as appropriate: medications, allergies, medical history  Review of Systems:  No other skin or systemic complaints except as noted in HPI or Assessment and Plan.  Objective  Well appearing patient in no apparent distress; mood and affect are within normal limits.   A focused examination was performed of the following areas: mid-back   Relevant exam findings are noted in the Assessment and Plan.        Assessment & Plan   1. Chronic Pruritic Dermatitis of the Back - Assessment: Patient reports chronically itchy spot on back with 7/10 severity persisting for 2 years. Temporary improvement with topical steroid during [place]  stay, but symptoms recurred upon discontinuation. Examination reveals isolated darker patch corresponding to bra contact area. Differential includes irritant contact dermatitis possibly due to nickel in bra, neuropathic itch, or combination of factors. 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 Contributing factors include history of tight hairstyles and past chemical relaxer use discontinued over 5 years ago. Current habit of constantly wearing hair pulled back may be exacerbating condition.  - Plan:    Prescribe compounded solution containing clobetasol, finasteride 1%, and minoxidil - apply thin layer to affected areas in morning only to prevent unwanted facial hair growth    Obtain clinical photographs for baseline comparison    Patient education on $45 out-of-pocket cost not covered by insurance    Compounding pharmacy will contact patient for order and shipping details    Recommend wearing hair loose when at home to reduce traction  Follow-up in 4 months to assess response to treatment and for baby hair growth evaluation.     Long term medication management.  Patient is using long term (months to years) prescription medication  to control their dermatologic condition.  These medications require periodic monitoring to evaluate for efficacy and side effects and may require periodic laboratory monitoring.     Return in about 4 months (around 11/02/2024), or contact derm.   Documentation: I have reviewed the above documentation for accuracy and completeness, and I agree with the above.  I, Shirron Maranda, CMA, am acting as Neurosurgeon  for Delon Lenis, DO.   Delon Lenis, DO

## 2024-07-03 NOTE — Patient Instructions (Addendum)
 Date: Thu Jul 03 2024  Hello Amanda Christensen,  Thank you for visiting today. Here is a summary of the key instructions:  - Medications:   - Apply clobetasol  cream to the itchy spot on your back twice a day for 2 weeks, then stop for 2 weeks, repeat this cycle for 3 months   - Use only a pea-sized amount   - Use CeraVe anti-itch lotion when not using clobetasol  cream   - Apply on top of the clobetasol  cream area   - Continue using during the 45-month break after the 17-month cycle  - Hair Treatment:   - Apply custom hair growth solution from [facility] pharmacy to thinning areas in the morning only   - Apply a thin layer with a few drops   - Rub it in gently   - Avoid applying at night to prevent unwanted hair growth on face  - Follow-up:   - Return for follow-up appointment in 4 months  - Other Instructions:   - Purchase anti-itch lotion from [employer] or [employer] in the first aid aisle under the itch section   - Clobetasol  cream will be sent to your regular pharmacy with 3 refills   - Custom hair growth solution will be made at [facility], costs $45 and is not covered by insurance   - The pharmacy will call you to arrange payment and delivery   - Call the office if your skin condition gets worse before it gets better  Please reach out if you have any questions or concerns.  Warm regards,  Dr. [facility]    Important Information  Due to recent changes in healthcare laws, you may see results of your pathology and/or laboratory studies on MyChart before the doctors have had a chance to review them. We understand that in some cases there may be results that are confusing or concerning to you. Please understand that not all results are received at the same time and often the doctors may need to interpret multiple results in order to provide you with the best plan of care or course of treatment. Therefore, we ask that you please give us  2 business days to thoroughly  review all your results before contacting the office for clarification. Should we see a critical lab result, you will be contacted sooner.   If You Need Anything After Your Visit  If you have any questions or concerns for your doctor, please call our main line at [phone] If no one answers, please leave a voicemail as directed and we will return your call as soon as possible. Messages left after 4 pm will be answered the following business day.   You may also send us  a message via MyChart. We typically respond to MyChart messages within 1-2 business days.  For prescription refills, please ask your pharmacy to contact our office. Our fax number is [phone].  If you have an urgent issue when the clinic is closed that cannot wait until the next business day, you can page your doctor at the number below.    Please note that while we do our best to be available for urgent issues outside of office hours, we are not available 24/7.   If you have an urgent issue and are unable to reach us , you may choose to seek medical care at your doctor's office, retail clinic, urgent care center, or emergency room.  If you have a medical emergency, please immediately call 911 or go to the emergency  department. In the event of inclement weather, please call our main line at [phone] for an update on the status of any delays or closures.  Dermatology Medication Tips: Please keep the boxes that topical medications come in in order to help keep track of the instructions about where and how to use these. Pharmacies typically print the medication instructions only on the boxes and not directly on the medication tubes.   If your medication is too expensive, please contact our office at [phone] or send us  a message through MyChart.   We are unable to tell what your co-pay for medications will be in advance as this is different depending on your insurance coverage. However, we may be able to find a  substitute medication at lower cost or fill out paperwork to get insurance to cover a needed medication.   If a prior authorization is required to get your medication covered by your insurance company, please allow us  1-2 business days to complete this process.  Drug prices often vary depending on where the prescription is filled and some pharmacies may offer cheaper prices.  The website www.goodrx.com contains coupons for medications through different pharmacies. The prices here do not account for what the cost may be with help from insurance (it may be cheaper with your insurance), but the website can give you the price if you did not use any insurance.  - You can print the associated coupon and take it with your prescription to the pharmacy.  - You may also stop by our office during regular business hours and pick up a GoodRx coupon card.  - If you need your prescription sent electronically to a different pharmacy, notify our office through [facility] or by phone at [phone]

## 2024-07-07 ENCOUNTER — Other Ambulatory Visit: Payer: Self-pay

## 2024-07-07 MED ORDER — CLOBETASOL PROPIONATE 0.05 % EX CREA: 1.0000 | TOPICAL_CREAM | Freq: Two times a day (BID) | CUTANEOUS | 3 refills | Status: AC

## 2024-07-07 NOTE — Progress Notes (Signed)
 Pt called LVM stating that Rx was not received.   I have resent Rx.

## 2024-08-19 ENCOUNTER — Ambulatory Visit: Admitting: Physical Medicine & Rehabilitation

## 2024-08-20 ENCOUNTER — Telehealth: Payer: Self-pay | Admitting: Physical Medicine & Rehabilitation

## 2024-08-20 NOTE — Telephone Encounter (Signed)
 Pt requested for her pill (Diazepam , Valium  5MG ) prior to her procedure. She wants someone to call [facility], and Elisabeth would put in the order at the pharmacy

## 2024-08-25 ENCOUNTER — Telehealth: Payer: Self-pay | Admitting: Physical Medicine & Rehabilitation

## 2024-08-25 MED ORDER — DIAZEPAM 5 MG PO TABS: ORAL_TABLET | ORAL | 0 refills | Status: AC

## 2024-08-25 MED ORDER — DIAZEPAM 5 MG PO TABS: ORAL_TABLET | ORAL | 0 refills | Status: DC

## 2024-08-25 NOTE — Telephone Encounter (Signed)
 [name]'s appt is tomorrow so [name] will have to sign so we can fax today.. It has been faxed and [name] is aware.

## 2024-08-25 NOTE — Telephone Encounter (Signed)
 Patient called requesting an update on the vitamin pill for her procedure on 08/26/24. She would like for us  to call [facility] to put in the order at the pharmacy.

## 2024-08-25 NOTE — Telephone Encounter (Signed)
 I will print out the Rx for Dr Carilyn to sign tomorrow. We have to fax the Rx to [facility]. Rx to be faxed to pharmacy fx # [phone]

## 2024-08-25 NOTE — Progress Notes (Deleted)
  PROCEDURE RECORD [facility]   Name: Amanda Christensen DOB:May 23, 1961 MRN: [id number]  Date:08/25/2024  Physician: Prentice Compton, MD    Nurse/CMA: ADIN RN  Allergies:  Allergies  Allergen Reactions   Penicillins Itching and Other (See Comments)        Oxycodone  Itching    Consent Signed: {yes wn:685467}  Is patient diabetic? {yes no:314532}  CBG today? ***  Pregnant: {yes no:314532} LMP: No LMP recorded. Patient is postmenopausal. (age 1-55)  Anticoagulants: {Yes/No:19989} Anti-inflammatory: {Yes/No:19989} Antibiotics: {Yes/No:19989}  Procedure: RIGHT LUMBAR 2-3-4 RADIOFREQUENCY NEUROTOMY Position: Prone Start Time: ***  End Time: ***  Fluoro Time: ***  RN/CMA Takayla Baillie RN Ahnika Hannibal RN    Time *** ***    BP *** ***    Pulse *** ***    Respirations *** ***    O2 Sat *** ***    S/S 6 6    Pain Level *** ***     D/C home with ***, patient A & O X 3, D/C instructions reviewed, and sits independently.

## 2024-08-26 ENCOUNTER — Ambulatory Visit: Admitting: Physical Medicine & Rehabilitation

## 2024-08-26 ENCOUNTER — Encounter: Payer: Medicare (Managed Care) | Admitting: Physical Medicine & Rehabilitation

## 2024-09-22 ENCOUNTER — Encounter: Payer: Self-pay | Admitting: Radiology

## 2024-09-29 NOTE — Progress Notes (Unsigned)
  PROCEDURE RECORD [facility]   Name: Amanda Christensen DOB:September 17, 1961 MRN: [id number]  Date:09/29/2024  Physician: Prentice Compton MD    Nurse/CMA: Tye Kitty MA  Allergies:  Allergies  Allergen Reactions   Penicillins Itching and Other (See Comments)        Oxycodone  Itching    Consent Signed: {yes wn:685467}  Is patient diabetic? {yes no:314532}  CBG today? ***  Pregnant: {yes no:314532} LMP: No LMP recorded. Patient is postmenopausal. (age 20-55)  Anticoagulants: {Yes/No:19989} Anti-inflammatory: {Yes/No:19989} Antibiotics: {Yes/No:19989}  Procedure: RIght L2-3-4 RFA.   Position: Prone Start Time: ***  End Time: ***  Fluoro Time: ***  RN/CMA      Time      BP      Pulse      Respirations      O2 Sat      S/S      Pain Level       D/C home with ***, patient A & O X 3, D/C instructions reviewed, and sits independently.

## 2024-10-02 ENCOUNTER — Encounter: Payer: Self-pay | Admitting: Physical Medicine & Rehabilitation

## 2024-10-02 ENCOUNTER — Encounter
Payer: Medicare (Managed Care) | Attending: Physical Medicine & Rehabilitation | Admitting: Physical Medicine & Rehabilitation

## 2024-10-02 VITALS — BP 112/75 | HR 71 | Temp 98.5°F | Ht 60.0 in | Wt 179.0 lb

## 2024-10-02 DIAGNOSIS — M47817 Spondylosis without myelopathy or radiculopathy, lumbosacral region: Secondary | ICD-10-CM | POA: Diagnosis present

## 2024-10-02 MED ORDER — LIDOCAINE HCL 1 % IJ SOLN
10.0000 mL | Freq: Once | INTRAMUSCULAR | Status: AC
Start: 2024-10-02 — End: 2024-10-02
  Administered 2024-10-02: 10 mL

## 2024-10-02 MED ORDER — LIDOCAINE HCL (PF) 2 % IJ SOLN
3.0000 mL | Freq: Once | INTRAMUSCULAR | Status: AC
Start: 2024-10-02 — End: 2024-10-02
  Administered 2024-10-02: 3 mL

## 2024-10-02 NOTE — Progress Notes (Signed)
 Right  L2, L4 and L3 medial branch radio frequency neurotomy under fluoroscopic guidance  Indication: Low back pain due to lumbar spondylosis which has been relieved on 2 occasions by greater than 50% by lumbar medial branch blocks at corresponding levels.  Informed consent was obtained after describing risks and benefits of the procedure with the patient, this includes bleeding, bruising, infection, paralysis and medication side effects. The patient wishes to proceed and has given written consent. The patient was placed in a prone position. The lumbar and sacral area was marked and prepped with Betadine. A 25-gauge 1-1/2 inch needle was inserted into the skin and subcutaneous tissue at 3 sites in one ML of 1% lidocaine  was injected into each site. Then a 18-gauge 10 cm radio frequency needle with a 1 cm curved active tip was inserted targeting the right L3 SAP/TP junction. Bone contact was made and confirmed with lateral imaging.  motor stimulation at 2 Hz confirm proper needle location followed by injection of one ML of 2% MPF lidocaine . Then the Right L5 SAP/transverse process junction was targeted. Bone contact was made and confirmed with lateral imaging motor stimulation at 2 Hz confirm proper needle location followed by injection of one ML of the solution containing one ML of  2% MPF lidocaine . Then the RIght L4 SAP/transverse process junction was targeted. Bone contact was made and confirmed with lateral imaging. motor stimulation at 2 Hz confirm proper needle location followed by injection of one ML of the solution containing one ML of2% MPF lidocaine . Radio frequency lesion  at [facility] for 90 seconds was performed. Needles were removed. Post procedure instructions and vital signs were performed. Patient tolerated procedure well. Followup appointment was given.

## 2024-10-02 NOTE — Patient Instructions (Signed)
You had a radio frequency procedure today This was done to alleviate joint pain in your lumbar area We injected lidocaine which is a local anesthetic.  You may experience soreness at the injection sites. You may also experienced some irritation of the nerves that were heated I'm recommending ice for 30 minutes every 2 hours as needed for the next 24-48 hours   

## 2024-11-14 ENCOUNTER — Other Ambulatory Visit: Payer: Self-pay | Admitting: Family Medicine

## 2024-11-14 ENCOUNTER — Ambulatory Visit
Admission: RE | Admit: 2024-11-14 | Discharge: 2024-11-14 | Disposition: A | Discharge status: Home / Self Care | Payer: Medicare (Managed Care) | Source: Ambulatory Visit | Attending: Family Medicine | Admitting: Family Medicine

## 2024-11-14 DIAGNOSIS — R059 Cough, unspecified: Secondary | ICD-10-CM

## 2024-11-26 ENCOUNTER — Ambulatory Visit: Admitting: Dermatology

## 2024-12-01 ENCOUNTER — Encounter: Payer: Self-pay | Admitting: Internal Medicine

## 2024-12-01 DIAGNOSIS — Z1231 Encounter for screening mammogram for malignant neoplasm of breast: Secondary | ICD-10-CM

## 2024-12-02 ENCOUNTER — Other Ambulatory Visit: Payer: Self-pay | Admitting: Internal Medicine

## 2024-12-02 ENCOUNTER — Encounter: Payer: Self-pay | Admitting: Internal Medicine

## 2024-12-02 DIAGNOSIS — Z1231 Encounter for screening mammogram for malignant neoplasm of breast: Secondary | ICD-10-CM

## 2024-12-11 ENCOUNTER — Ambulatory Visit: Payer: Medicare (Managed Care) | Admitting: Orthopaedic Surgery

## 2024-12-18 ENCOUNTER — Encounter: Payer: Self-pay | Admitting: Pulmonary Disease

## 2024-12-18 ENCOUNTER — Ambulatory Visit: Payer: Medicare (Managed Care) | Admitting: Pulmonary Disease

## 2024-12-18 VITALS — BP 134/80 | HR 60 | Temp 98.0°F | Ht 60.0 in | Wt 173.0 lb

## 2024-12-18 DIAGNOSIS — J45909 Unspecified asthma, uncomplicated: Secondary | ICD-10-CM | POA: Diagnosis not present

## 2024-12-18 DIAGNOSIS — Z87891 Personal history of nicotine dependence: Secondary | ICD-10-CM | POA: Diagnosis not present

## 2024-12-18 DIAGNOSIS — J449 Chronic obstructive pulmonary disease, unspecified: Secondary | ICD-10-CM

## 2024-12-18 DIAGNOSIS — J410 Simple chronic bronchitis: Secondary | ICD-10-CM

## 2024-12-18 DIAGNOSIS — J4540 Moderate persistent asthma, uncomplicated: Secondary | ICD-10-CM

## 2024-12-18 NOTE — Patient Instructions (Signed)
" °  VISIT SUMMARY: During your visit, we discussed your recurrent respiratory symptoms related to COPD and asthma. We reviewed your current medications and history of tobacco use.  YOUR PLAN: CHRONIC OBSTRUCTIVE PULMONARY DISEASE WITH ASTHMA: You have COPD with asthma, which has been causing increased coughing, shortness of breath, and wheezing recently. -Continue using your Advair inhaler twice daily. -A chest CT has been ordered to further evaluate your condition. -Lung function tests have been scheduled. -We will follow up in 2-3 months to review your progress.  HISTORY OF TOBACCO USE: You have a significant history of tobacco use, including crack cocaine, but you have been abstinent since 2014. -Continue to abstain from tobacco use.    Contains text generated by [employer].   "

## 2024-12-18 NOTE — Progress Notes (Signed)
 "              Amanda Christensen    [id number]    June 28, 1961  Primary Care Physician:Reed, Annabella CROME, DO  Referring Physician: [facility] [address] [place],  [place] [address]  Chief complaint: Consult for COPD  HPI: 64 y.o. who  has a past medical history of Arthritis, Asthma, Chronic pain, COPD (chronic obstructive pulmonary disease) (HCC), Depression, DVT (deep venous thrombosis) (HCC), GERD (gastroesophageal reflux disease), Heart murmur, High cholesterol, History of anxiety, History of bronchitis, Hypertension, Insomnia, Obesity, Peripheral vascular disease (10), Seizures (HCC) (03), and Shortness of breath dyspnea.  Discussed the use of AI scribe software for clinical note transcription with the patient, who gave verbal consent to proceed.  History of Present Illness Amanda Christensen is a 64 year old female with COPD and asthma who presents with recurrent respiratory symptoms.  Respiratory symptoms - Intermittent flares of cough, shortness of breath, and occasional wheezing over the past 1-2 months - Requires more frequent home breathing treatments during symptom flares  Inhaled and nebulized therapy - Uses Advair one puff in the morning and one at night - Takes montelukast  daily - Utilizes a home nebulizer to reduce emergency visits   Relevant Pulmonary history: Pets: No pets Occupation: Used to work in office manager and as a LAWYER.  Currently on disability Exposures: No mold, hot tub, Jacuzzi.  No feather pillow comforter No h/o chemo/XRT/amiodarone/macrodantin/MTX  No exposure to asbestos, silica or other organic allergens  Smoking history: 9-pack-year smoker.  Quit in 2014. Used crack cocaine in the past. Abstinent from tobacco and crack cocaine since 2014 after smoking for over a decade starting in her thirties Travel history: Previously lived in [place] .  No significant recent travel Family history: No family history of lung disease  Outpatient  Encounter Medications as of 12/18/2024  Medication Sig   albuterol  (PROVENTIL  HFA;VENTOLIN  HFA) 108 (90 BASE) MCG/ACT inhaler Inhale 1-2 puffs into the lungs every 6 (six) hours as needed for wheezing or shortness of breath.   albuterol  (PROVENTIL ) (2.5 MG/3ML) 0.083% nebulizer solution Inhale 2.5 mg into the lungs every 6 (six) hours as needed for wheezing or shortness of breath.    atorvastatin (LIPITOR) 20 MG tablet Take 20 mg by mouth daily. Take one tablet by mouth once daily   Cholecalciferol  (VITAMIN D-3 PO) Take 400 Units by mouth daily. Take one tablet by mouth once daily   clobetasol  cream (TEMOVATE ) 0.05 % Apply 1 Application topically 2 (two) times daily. Use 2 weeks on, 2 weeks off. Repeat PRN.   diazepam  (VALIUM ) 5 MG tablet Take 5 mg tablet po 30 minutes prior to procedure. Must have a driver.   Fluticasone -Salmeterol (ADVAIR) 250-50 MCG/DOSE AEPB Inhale 1 puff into the lungs daily.    lidocaine  (HM LIDOCAINE  PATCH) 4 % Place 1 patch onto the skin daily.   meloxicam  (MOBIC ) 7.5 MG tablet Take 1 tablet (7.5 mg total) by mouth daily.   metoprolol succinate (TOPROL-XL) 50 MG 24 hr tablet Take 50 mg by mouth daily. Take with or immediately following a meal.   montelukast  (SINGULAIR ) 10 MG tablet Take 10 mg by mouth at bedtime.   Safety Seal Miscellaneous MISC Solution Minoxidil 8% clobetasol  0.05% finasteride 1% - apply to affected areas of the scalp QAM   Semaglutide,0.25 or 0.5MG /DOS, (OZEMPIC, 0.25 OR 0.5 MG/DOSE,) 2 MG/1.5ML SOPN Inject into the skin.   traZODone  (DESYREL ) 100 MG tablet Take 100 mg by  mouth at bedtime.   No facility-administered encounter medications on file as of 12/18/2024.     Physical Exam: Today's Vitals   12/18/24 0925  BP: 134/80  Pulse: 60  Temp: 98 F (36.7 C)  TempSrc: Oral  SpO2: 99%  Weight: 173 lb (78.5 kg)  Height: 5' (1.524 m)   Body mass index is 33.79 kg/m.  Physical Exam GEN: No acute distress CV: Regular rate and rhythm, no  murmurs LUNGS: Clear to auscultation bilaterally, normal respiratory effort SKIN JOINTS: Warm and dry, no rash    Data Reviewed: Imaging: CTA 04/04/2014-  no PE, subcentimeter pulmonary nodules up to 3 mm. Chest x-ray 11/14/2024-lungs are clear.  No acute cardiopulmonary abnormality. I have reviewed the images personally.  PFTs:  Labs:  Sleep: Sleep study 04/30/2019-no significant sleep apnea.  AHI 0.   Assessment and Plan Assessment & Plan Chronic obstructive pulmonary disease with asthma COPD with asthma, previously managed with Advair and Singulair . Recent flare-ups with increased coughing, shortness of breath, and wheezing. Smoking history with cessation in 214. Current inhaler is a generic version of Advair, which is acceptable. Previous imaging (x-ray and CT) were unremarkable. Lung function tests and CT are needed for further evaluation. - Continue Advair inhaler twice daily. - Ordered chest CT. - Scheduled lung function tests. - Will follow up in 2-3 months.  History of tobacco use Significant tobacco use with cessation in 2014. Smoking history includes crack cocaine use, contributing to respiratory issues. No current tobacco use. - Continue to abstain from tobacco use.  Recommendations: Continue Advair Schedule CT chest, PFTs  Lonna Coder MD [facility] 12/18/2024, 9:12 AM  CC: [facility], [address]   "

## 2024-12-26 ENCOUNTER — Ambulatory Visit: Admission: RE | Admit: 2024-12-26 | Payer: Medicare (Managed Care) | Source: Ambulatory Visit

## 2024-12-26 DIAGNOSIS — Z1231 Encounter for screening mammogram for malignant neoplasm of breast: Secondary | ICD-10-CM

## 2024-12-29 ENCOUNTER — Other Ambulatory Visit: Payer: Medicare (Managed Care)

## 2024-12-31 ENCOUNTER — Other Ambulatory Visit: Payer: Medicare (Managed Care)

## 2025-01-02 ENCOUNTER — Encounter: Payer: Medicare (Managed Care) | Admitting: Physical Medicine & Rehabilitation

## 2025-03-18 ENCOUNTER — Ambulatory Visit: Payer: Medicare (Managed Care) | Admitting: Pulmonary Disease
